# Patient Record
Sex: Female | Born: 2000 | Race: White | Hispanic: Yes | Marital: Single | Smoking: Former smoker
Health system: Southern US, Community
[De-identification: ages and names within clinical notes are randomized; demographics above are authoritative.]

## PROBLEM LIST (undated history)

## (undated) DIAGNOSIS — I10 Essential (primary) hypertension: Secondary | ICD-10-CM

## (undated) DIAGNOSIS — E039 Hypothyroidism, unspecified: Secondary | ICD-10-CM

---

## 2021-03-14 ENCOUNTER — Emergency Department (HOSPITAL_BASED_OUTPATIENT_CLINIC_OR_DEPARTMENT_OTHER)
Admission: EM | Admit: 2021-03-14 | Discharge: 2021-03-14 | Disposition: A | Discharge status: Home / Self Care | Payer: Medicaid Other | Attending: Emergency Medicine | Admitting: Emergency Medicine

## 2021-03-14 ENCOUNTER — Encounter (HOSPITAL_BASED_OUTPATIENT_CLINIC_OR_DEPARTMENT_OTHER): Payer: Self-pay | Admitting: Obstetrics and Gynecology

## 2021-03-14 ENCOUNTER — Other Ambulatory Visit: Payer: Self-pay

## 2021-03-14 ENCOUNTER — Emergency Department (HOSPITAL_BASED_OUTPATIENT_CLINIC_OR_DEPARTMENT_OTHER): Payer: Medicaid Other

## 2021-03-14 DIAGNOSIS — F17290 Nicotine dependence, other tobacco product, uncomplicated: Secondary | ICD-10-CM | POA: Diagnosis not present

## 2021-03-14 DIAGNOSIS — R112 Nausea with vomiting, unspecified: Secondary | ICD-10-CM | POA: Diagnosis present

## 2021-03-14 DIAGNOSIS — E860 Dehydration: Secondary | ICD-10-CM | POA: Diagnosis not present

## 2021-03-14 DIAGNOSIS — E039 Hypothyroidism, unspecified: Secondary | ICD-10-CM | POA: Diagnosis not present

## 2021-03-14 DIAGNOSIS — R000 Tachycardia, unspecified: Secondary | ICD-10-CM | POA: Insufficient documentation

## 2021-03-14 DIAGNOSIS — R630 Anorexia: Secondary | ICD-10-CM | POA: Diagnosis not present

## 2021-03-14 DIAGNOSIS — I10 Essential (primary) hypertension: Secondary | ICD-10-CM | POA: Insufficient documentation

## 2021-03-14 HISTORY — DX: Hypothyroidism, unspecified: E03.9

## 2021-03-14 HISTORY — DX: Essential (primary) hypertension: I10

## 2021-03-14 LAB — URINALYSIS, ROUTINE W REFLEX MICROSCOPIC
Bilirubin Urine: NEGATIVE
Glucose, UA: NEGATIVE mg/dL
Ketones, ur: >80 mg/dL — AB
Leukocytes,Ua: NEGATIVE
Nitrite: NEGATIVE
Protein, ur: >300 mg/dL — AB
Specific Gravity, Urine: 1.022 (ref 1.005–1.030)
pH: 5.5 (ref 5.0–8.0)

## 2021-03-14 LAB — COMPREHENSIVE METABOLIC PANEL
ALT: 39 U/L (ref 0–44)
AST: 28 U/L (ref 15–41)
Albumin: 4.6 g/dL (ref 3.5–5.0)
Alkaline Phosphatase: 69 U/L (ref 38–126)
Anion gap: 19 — ABNORMAL HIGH (ref 5–15)
BUN: 17 mg/dL (ref 6–20)
CO2: 17 mmol/L — ABNORMAL LOW (ref 22–32)
Calcium: 10.2 mg/dL (ref 8.9–10.3)
Chloride: 102 mmol/L (ref 98–111)
Creatinine, Ser: 1.43 mg/dL — ABNORMAL HIGH (ref 0.44–1.00)
GFR, Estimated: 54 mL/min — ABNORMAL LOW (ref >60)
Glucose, Bld: 75 mg/dL (ref 70–99)
Potassium: 3.8 mmol/L (ref 3.5–5.1)
Sodium: 138 mmol/L (ref 135–145)
Total Bilirubin: 0.7 mg/dL (ref 0.3–1.2)
Total Protein: 8.5 g/dL — ABNORMAL HIGH (ref 6.5–8.1)

## 2021-03-14 LAB — CBC
HCT: 55.4 % — ABNORMAL HIGH (ref 36.0–46.0)
Hemoglobin: 17.9 g/dL — ABNORMAL HIGH (ref 12.0–15.0)
MCH: 27.8 pg (ref 26.0–34.0)
MCHC: 32.3 g/dL (ref 30.0–36.0)
MCV: 86 fL (ref 80.0–100.0)
Platelets: 345 10*3/uL (ref 150–400)
RBC: 6.44 MIL/uL — ABNORMAL HIGH (ref 3.87–5.11)
RDW: 16.8 % — ABNORMAL HIGH (ref 11.5–15.5)
WBC: 9.8 10*3/uL (ref 4.0–10.5)
nRBC: 0 % (ref 0.0–0.2)

## 2021-03-14 LAB — TSH: TSH: 2.778 u[IU]/mL (ref 0.350–4.500)

## 2021-03-14 LAB — PREGNANCY, URINE: Preg Test, Ur: NEGATIVE

## 2021-03-14 LAB — LIPASE, BLOOD: Lipase: 45 U/L (ref 11–51)

## 2021-03-14 MED ORDER — ONDANSETRON HCL 4 MG/2ML IJ SOLN
4.0000 mg | Freq: Once | INTRAMUSCULAR | Status: AC
  Administered 2021-03-14: 4 mg via INTRAVENOUS
  Filled 2021-03-14: qty 2

## 2021-03-14 MED ORDER — IOHEXOL 300 MG/ML  SOLN
100.0000 mL | Freq: Once | INTRAMUSCULAR | Status: AC | PRN
  Administered 2021-03-14: 100 mL via INTRAVENOUS

## 2021-03-14 MED ORDER — SODIUM CHLORIDE 0.9 % IV BOLUS
1000.0000 mL | Freq: Once | INTRAVENOUS | Status: AC
  Administered 2021-03-14: 1000 mL via INTRAVENOUS

## 2021-03-14 MED ORDER — ONDANSETRON 4 MG PO TBDP: 4.0000 mg | ORAL_TABLET | Freq: Three times a day (TID) | ORAL | 0 refills | Status: DC | PRN

## 2021-03-14 NOTE — ED Provider Notes (Signed)
[facility] EMERGENCY DEPT Provider Note   CSN: [id number] Arrival date & time: 03/14/21  1807     History Chief Complaint  Patient presents with  . Nausea  . Anorexia    Diane Todd is a 20 y.o. female.  Pt presents to the ED today with nausea and poor appetite.  Pt initially went to the [facility] ED on 4/15.  She was told she had a uti and was d/c with macrobid and phenergan.  She went back on the 18th because she was still not feeling well and meds were changed to keflex and zofran.  Pt followed up with her pcp who put her on omeprazole.  Pt is still feeling nauseous and has not been able to eat or drink anything.  She denies any pain.  No fever.        Past Medical History:  Diagnosis Date  . Hypertension   . Hypothyroidism     There are no problems to display for this patient.   History reviewed. No pertinent surgical history.   OB History    Gravida  0   Para  0   Term  0   Preterm  0   AB  0   Living  0     SAB  0   IAB  0   Ectopic  0   Multiple  0   Live Births  0           No family history on file.  Social History   Tobacco Use  . Smoking status: Current Some Day Smoker    Types: E-cigarettes  . Smokeless tobacco: Never Used  Vaping Use  . Vaping Use: Some days  . Substances: Nicotine, Flavoring  Substance Use Topics  . Alcohol use: Not Currently  . Drug use: Not Currently    Home Medications Prior to Admission medications   Medication Sig Start Date End Date Taking? Authorizing Provider  ondansetron (ZOFRAN ODT) 4 MG disintegrating tablet Take 1 tablet (4 mg total) by mouth every 8 (eight) hours as needed for nausea or vomiting. 03/14/21  Yes Jacalyn Lefevre, MD    Allergies    Patient has no allergy information on record.  Review of Systems   Review of Systems  Gastrointestinal: Positive for nausea and vomiting.  All other systems reviewed and are negative.   Physical Exam Updated Vital  Signs BP 126/88 (BP Location: Left Arm)   Pulse 84   Temp 99.2 F (37.3 C) (Oral)   Resp 15   Ht 5\' 7"  (1.702 m)   Wt 111.1 kg   LMP 02/21/2021 Comment: neg preg test  SpO2 99%   BMI 38.37 kg/m   Physical Exam Vitals and nursing note reviewed.  Constitutional:      Appearance: Normal appearance.  HENT:     Head: Normocephalic and atraumatic.     Right Ear: External ear normal.     Left Ear: External ear normal.     Nose: Nose normal.     Mouth/Throat:     Mouth: Mucous membranes are dry.  Eyes:     Extraocular Movements: Extraocular movements intact.     Conjunctiva/sclera: Conjunctivae normal.     Pupils: Pupils are equal, round, and reactive to light.  Cardiovascular:     Rate and Rhythm: Regular rhythm. Tachycardia present.     Pulses: Normal pulses.     Heart sounds: Normal heart sounds.  Pulmonary:     Effort: Pulmonary effort  is normal.     Breath sounds: Normal breath sounds.  Abdominal:     General: Abdomen is flat. Bowel sounds are normal.     Palpations: Abdomen is soft.  Musculoskeletal:        General: Normal range of motion.     Cervical back: Normal range of motion and neck supple.  Skin:    General: Skin is warm.     Capillary Refill: Capillary refill takes less than 2 seconds.  Neurological:     General: No focal deficit present.     Mental Status: She is alert and oriented to person, place, and time.  Psychiatric:        Mood and Affect: Mood normal.        Behavior: Behavior normal.        Thought Content: Thought content normal.        Judgment: Judgment normal.     ED Results / Procedures / Treatments   Labs (all labs ordered are listed, but only abnormal results are displayed) Labs Reviewed  COMPREHENSIVE METABOLIC PANEL - Abnormal; Notable for the following components:      Result Value   CO2 17 (*)    Creatinine, Ser 1.43 (*)    Total Protein 8.5 (*)    GFR, Estimated 54 (*)    Anion gap 19 (*)    All other components within  normal limits  CBC - Abnormal; Notable for the following components:   RBC 6.44 (*)    Hemoglobin 17.9 (*)    HCT 55.4 (*)    RDW 16.8 (*)    All other components within normal limits  URINALYSIS, ROUTINE W REFLEX MICROSCOPIC - Abnormal; Notable for the following components:   APPearance HAZY (*)    Hgb urine dipstick MODERATE (*)    Ketones, ur >80 (*)    Protein, ur >300 (*)    Bacteria, UA MANY (*)    All other components within normal limits  URINE CULTURE  LIPASE, BLOOD  PREGNANCY, URINE  TSH    EKG None  Radiology CT ABDOMEN PELVIS W CONTRAST  Result Date: 03/14/2021 CLINICAL DATA:  Nausea and malaise for the past 2 weeks. EXAM: CT ABDOMEN AND PELVIS WITH CONTRAST TECHNIQUE: Multidetector CT imaging of the abdomen and pelvis was performed using the standard protocol following bolus administration of intravenous contrast. CONTRAST:  OMNIPAQUE IOHEXOL 300 MG/ML  SOLN COMPARISON:  Right upper quadrant ultrasound dated March 04, 2021. FINDINGS: Lower chest: No acute abnormality. Hepatobiliary: No focal liver abnormality is seen. No gallstones, gallbladder wall thickening, or biliary dilatation. Pancreas: Unremarkable. No pancreatic ductal dilatation or surrounding inflammatory changes. Spleen: Normal in size without focal abnormality. Adrenals/Urinary Tract: The adrenal glands are unremarkable. Lobulated appearance of both kidneys related to cortical scarring. Normal renal enhancement. Partially duplicated left renal collecting system. No renal calculi or hydronephrosis. The bladder is decompressed. Stomach/Bowel: Stomach is within normal limits. Appendix appears normal. No evidence of bowel wall thickening, distention, or inflammatory changes. Vascular/Lymphatic: No significant vascular findings are present. No enlarged abdominal or pelvic lymph nodes. Reproductive: Uterus and bilateral adnexa are unremarkable. Other: Trace free fluid in the pelvis, likely physiologic. No  pneumoperitoneum. Musculoskeletal: No acute or significant osseous findings. IMPRESSION: 1. No acute intra-abdominal process. 2. Bilateral renal cortical scarring. No CT evidence of acute pyelonephritis. Electronically Signed   By: Obie Dredge M.D.   On: 03/14/2021 20:40    Procedures Procedures   Medications Ordered in ED Medications  ondansetron (  ZOFRAN) injection 4 mg (4 mg Intravenous Given 03/14/21 1842)  sodium chloride 0.9 % bolus 1,000 mL ( Intravenous Stopped 03/14/21 1944)  iohexol (OMNIPAQUE) 300 MG/ML solution 100 mL (100 mLs Intravenous Contrast Given 03/14/21 2021)    ED Course  I have reviewed the triage vital signs and the nursing notes.  Pertinent labs & imaging results that were available during my care of the patient were reviewed by me and considered in my medical decision making (see chart for details).    MDM Rules/Calculators/A&P                          Pt is feeling much better.  She is now tolerating po fluids.  HR is better.  Pt's Cr is slightly high at 1.43 and she has some protein in her urine.  Culture of urine sent.  Pt is to f/u with gi.  Return if worse.   Final Clinical Impression(s) / ED Diagnoses Final diagnoses:  Dehydration  Non-intractable vomiting with nausea, unspecified vomiting type    Rx / DC Orders ED Discharge Orders         Ordered    Ambulatory referral to Gastroenterology        03/14/21 2155    ondansetron (ZOFRAN ODT) 4 MG disintegrating tablet  Every 8 hours PRN        03/14/21 2156           Jacalyn Lefevre, MD 03/14/21 2158

## 2021-03-14 NOTE — ED Triage Notes (Signed)
Patient reports for the past 2 weeks she has been increasingly nauseated and has not been able to eat or drink much. Patient went to the ER and was told she had a kidney infection and placed on antibioitcs, no CT scan performed. Patient reports she was getting worse and returned to the ER and was told she had "abnormal kidney" function levels and to follow up with her PCP. Patient reports her PCP did not do anything and she has started to feel worse.

## 2021-03-16 LAB — URINE CULTURE: Culture: NO GROWTH

## 2021-03-18 ENCOUNTER — Encounter: Payer: Self-pay | Admitting: Internal Medicine

## 2021-03-18 ENCOUNTER — Telehealth: Payer: Self-pay | Admitting: Internal Medicine

## 2021-03-18 NOTE — Telephone Encounter (Signed)
Er cone referral

## 2021-03-18 NOTE — Telephone Encounter (Signed)
Ok for new pt appointment  

## 2021-04-18 DIAGNOSIS — B9681 Helicobacter pylori [H. pylori] as the cause of diseases classified elsewhere: Secondary | ICD-10-CM | POA: Diagnosis present

## 2021-04-18 DIAGNOSIS — Z8679 Personal history of other diseases of the circulatory system: Secondary | ICD-10-CM

## 2021-04-18 HISTORY — DX: Helicobacter pylori (H. pylori) as the cause of diseases classified elsewhere: B96.81

## 2021-05-03 ENCOUNTER — Inpatient Hospital Stay (HOSPITAL_COMMUNITY): Payer: Medicaid Other

## 2021-05-03 ENCOUNTER — Inpatient Hospital Stay (HOSPITAL_COMMUNITY)
Admission: AD | Admit: 2021-05-03 | Discharge: 2021-05-17 | DRG: 058 | Disposition: A | Discharge status: Rehabilitation Facility | Payer: Medicaid Other | Source: Other Acute Inpatient Hospital | Attending: Internal Medicine | Admitting: Internal Medicine

## 2021-05-03 DIAGNOSIS — Z79899 Other long term (current) drug therapy: Secondary | ICD-10-CM | POA: Diagnosis not present

## 2021-05-03 DIAGNOSIS — Z6832 Body mass index (BMI) 32.0-32.9, adult: Secondary | ICD-10-CM

## 2021-05-03 DIAGNOSIS — K5900 Constipation, unspecified: Secondary | ICD-10-CM | POA: Diagnosis present

## 2021-05-03 DIAGNOSIS — K2970 Gastritis, unspecified, without bleeding: Secondary | ICD-10-CM | POA: Diagnosis not present

## 2021-05-03 DIAGNOSIS — M7918 Myalgia, other site: Secondary | ICD-10-CM | POA: Diagnosis not present

## 2021-05-03 DIAGNOSIS — Z8679 Personal history of other diseases of the circulatory system: Secondary | ICD-10-CM

## 2021-05-03 DIAGNOSIS — H5500 Unspecified nystagmus: Secondary | ICD-10-CM | POA: Diagnosis present

## 2021-05-03 DIAGNOSIS — F17290 Nicotine dependence, other tobacco product, uncomplicated: Secondary | ICD-10-CM | POA: Diagnosis present

## 2021-05-03 DIAGNOSIS — H469 Unspecified optic neuritis: Secondary | ICD-10-CM | POA: Diagnosis present

## 2021-05-03 DIAGNOSIS — E512 Wernicke's encephalopathy: Secondary | ICD-10-CM | POA: Diagnosis present

## 2021-05-03 DIAGNOSIS — Z79890 Hormone replacement therapy (postmenopausal): Secondary | ICD-10-CM

## 2021-05-03 DIAGNOSIS — I10 Essential (primary) hypertension: Secondary | ICD-10-CM | POA: Diagnosis not present

## 2021-05-03 DIAGNOSIS — E669 Obesity, unspecified: Secondary | ICD-10-CM | POA: Diagnosis present

## 2021-05-03 DIAGNOSIS — N289 Disorder of kidney and ureter, unspecified: Secondary | ICD-10-CM | POA: Diagnosis not present

## 2021-05-03 DIAGNOSIS — E039 Hypothyroidism, unspecified: Secondary | ICD-10-CM

## 2021-05-03 DIAGNOSIS — G360 Neuromyelitis optica [Devic]: Secondary | ICD-10-CM | POA: Diagnosis not present

## 2021-05-03 DIAGNOSIS — R270 Ataxia, unspecified: Secondary | ICD-10-CM | POA: Diagnosis present

## 2021-05-03 DIAGNOSIS — N182 Chronic kidney disease, stage 2 (mild): Secondary | ICD-10-CM | POA: Diagnosis present

## 2021-05-03 DIAGNOSIS — N179 Acute kidney failure, unspecified: Secondary | ICD-10-CM | POA: Diagnosis present

## 2021-05-03 DIAGNOSIS — R3129 Other microscopic hematuria: Secondary | ICD-10-CM | POA: Diagnosis present

## 2021-05-03 DIAGNOSIS — B9620 Unspecified Escherichia coli [E. coli] as the cause of diseases classified elsewhere: Secondary | ICD-10-CM | POA: Diagnosis present

## 2021-05-03 DIAGNOSIS — B9681 Helicobacter pylori [H. pylori] as the cause of diseases classified elsewhere: Secondary | ICD-10-CM | POA: Diagnosis not present

## 2021-05-03 DIAGNOSIS — E43 Unspecified severe protein-calorie malnutrition: Secondary | ICD-10-CM | POA: Diagnosis present

## 2021-05-03 DIAGNOSIS — D849 Immunodeficiency, unspecified: Secondary | ICD-10-CM | POA: Diagnosis present

## 2021-05-03 DIAGNOSIS — H40053 Ocular hypertension, bilateral: Secondary | ICD-10-CM | POA: Diagnosis present

## 2021-05-03 DIAGNOSIS — N189 Chronic kidney disease, unspecified: Secondary | ICD-10-CM

## 2021-05-03 DIAGNOSIS — Z8619 Personal history of other infectious and parasitic diseases: Secondary | ICD-10-CM | POA: Diagnosis not present

## 2021-05-03 DIAGNOSIS — D649 Anemia, unspecified: Secondary | ICD-10-CM | POA: Diagnosis present

## 2021-05-03 DIAGNOSIS — I129 Hypertensive chronic kidney disease with stage 1 through stage 4 chronic kidney disease, or unspecified chronic kidney disease: Secondary | ICD-10-CM | POA: Diagnosis present

## 2021-05-03 DIAGNOSIS — E038 Other specified hypothyroidism: Secondary | ICD-10-CM | POA: Diagnosis present

## 2021-05-03 DIAGNOSIS — N1831 Chronic kidney disease, stage 3a: Secondary | ICD-10-CM | POA: Diagnosis not present

## 2021-05-03 DIAGNOSIS — R339 Retention of urine, unspecified: Secondary | ICD-10-CM | POA: Diagnosis not present

## 2021-05-03 DIAGNOSIS — R112 Nausea with vomiting, unspecified: Secondary | ICD-10-CM

## 2021-05-03 DIAGNOSIS — F411 Generalized anxiety disorder: Secondary | ICD-10-CM | POA: Diagnosis not present

## 2021-05-03 DIAGNOSIS — M7989 Other specified soft tissue disorders: Secondary | ICD-10-CM | POA: Diagnosis not present

## 2021-05-03 MED ORDER — LORAZEPAM 2 MG/ML IJ SOLN
1.0000 mg | Freq: Once | INTRAMUSCULAR | Status: AC
  Administered 2021-05-03: 1 mg via INTRAVENOUS

## 2021-05-03 MED ORDER — LORAZEPAM 2 MG/ML IJ SOLN
2.0000 mg | Freq: Once | INTRAMUSCULAR | Status: DC
  Filled 2021-05-03: qty 1

## 2021-05-03 NOTE — Consult Note (Addendum)
Neurology Consult H&P  Diane Todd MR# [id number] 05/03/2021   CC: Imaging suggesting NMOSD  History is obtained from: patient, sister and chart  HPI: Diane Todd is a 20 y.o. 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 She initially presented to OSH ED after having 2 weeks of generalized weakness, poor appetite, blurred vision. Workup was not revealing and she was discharged however, symptoms progressed to include unsteadiness prompting her to return to ED for further evaluation. MRI brain showed multiple abnormalities and she was transferred for higher level of care.    She has never had this before.  Denies: dizziness/nausea/vomiting changes in bladder/bowel. there are no obvious aggravating or relieving factors.  There is no family history of demyelinating disease.  ROS: A complete ROS was performed and is negative except as noted in the HPI.   Past Medical History:  Diagnosis Date   Hypertension    Hypothyroidism    Social History:  reports that she has been smoking e-cigarettes. She has never used smokeless tobacco. She reports previous alcohol use. She reports previous drug use.   Prior to Admission medications   Medication Sig Start Date End Date Taking? Authorizing Provider  amoxicillin (AMOXIL) 500 MG capsule Take 1,000 mg by mouth every 12 (twelve) hours. Take for 14 days. Patient not taking: Reported on 05/03/2021 04/22/21 05/06/21  [provider]  clarithromycin (BIAXIN) 500 MG tablet Take 500 mg by mouth every 12 (twelve) hours. Take for 14 days. Patient not taking: Reported on 05/03/2021 04/22/21 05/06/21  [provider]  levothyroxine (SYNTHROID) 100 MCG tablet Take 100 mcg by mouth daily. Patient not taking: Reported on 05/03/2021    [provider]  metoprolol succinate (TOPROL-XL) 25 MG 24 hr tablet Take 25 mg by mouth daily. Patient not taking: Reported on 05/03/2021    [provider]  ondansetron (ZOFRAN ODT) 4 MG disintegrating tablet Take 1 tablet (4 mg total) by mouth every 8 (eight) hours as needed for nausea or vomiting. Patient not taking: Reported on 05/03/2021 03/14/21   Jacalyn Lefevre, MD  pantoprazole (PROTONIX) 40 MG tablet Take 40 mg by mouth 2 (two) times daily. Patient not taking: Reported on 05/03/2021 04/10/21   [provider]  sucralfate (CARAFATE) 1 g tablet Take 1 g by mouth every 6 (six) hours. Take for 14 days. Patient not taking: Reported on 05/03/2021 04/22/21 05/06/21  [provider]    Exam: Current vital signs: BP 113/64 (BP Location: Left Arm)   Pulse 95   Temp 99.3 F (37.4 C) (Oral)   Resp 17   SpO2 99%   Physical Exam  Constitutional: Appears well-developed and well-nourished.  Psych: Affect appropriate to situation Eyes: No scleral injection HENT: No OP obstruction. Head: Normocephalic.  Cardiovascular: Normal rate and regular rhythm.  Respiratory: Effort normal, symmetric excursions bilaterally, no audible wheezing. GI: Soft.  No distension. There is no tenderness.  Skin: WDI  Neuro: Mental Status: Patient is awake, alert, oriented to person, place, month, year, and situation. Patient is able to give a clear and coherent history. Speech fluent, intact comprehension and repetition. No signs of aphasia or neglect. Visual Fields are full. Pupils are equal, round, and reactive to light. EOMI without ptosis or diploplia.  Facial sensation is symmetric to temperature Facial movement is symmetric.  Hearing is intact to voice. Uvula midline and palate elevates symmetrically. Shoulder shrug is symmetric. Tongue is midline without atrophy or fasciculations.  Tone is normal.  Bulk is normal. 5/5 strength was present in all four extremities. Sensation is decreased to temperature bilateral legs>arms. Deep Tendon Reflexes: 2+ and symmetric in the biceps and patellae. Toes are downgoing bilaterally. No UE  ataxia, decompensation of movement, mild ataxia in lower extremities. Gait - Deferred  I have reviewed labs in epic and the pertinent results are: No pertinent lab results at this time.  MRI brain 05/02/2021 from OSH read as T2 hyperintensity along the dorsal midbrain bilaterally about the cerebral aqueduct involving tectum and tegmentum. This extends inferiorly along the dorsal pons and medulla abutting the floor of the fourth ventricle including area  postrema. Additionally, there is involvement of the medial and posteromedial thalamus bilaterally.   Assessment: Diane Todd is a 20 y.o. 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 Review of her prior history in the chart shows that she has had persistent/chronic nausea and vomiting since April 2022. She also has symptoms of blurry vision with eye discomfort which occurred after the nausea and vomiting. 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 If she meets MRI criteria, the diagnosis may be made in the absence of serology however, labs have been ordered.   Plan: - MRI brain without and with contrast to include the orbits - Ordered. - MRI without and with contrast cervical and thoracic spines - Ordered. - Serum for AQP4-IgG antibody and MOG-IgG antibody test - Ordered (send out). - HIV serology to rule out possible confounder. - Methylprednisolone 1,000mg  x 5 days without an oral taper - If she is poorly responsive to glucocorticoids, recommend plasma exchange. - PPI daily. - PT/OT  evaluation. - Ppx DVT and docusate. - After day 5 of therapy, she will need to follow up with outpatient neurology, and based on lab results they will then select available treatment options (eculizumab, inebilizumab, satralizumab, off-label rituximab).    Electronically signed by:  Marisue Humble, MD Page: [phone] 05/03/2021, 7:44 PM

## 2021-05-03 NOTE — Progress Notes (Signed)
Diane Todd is a 20 y.o. female patient admitted from [facility]. Patient arrived to unit at 1840 via stretcher ASSESSMENT: Patient is awake, alert  & orientated  X 4  No Orders on, MD notified of patient arrival to the unit. VSS - Blood pressure 117/66, pulse (!) 103, temperature 99.8 F (37.7 C), temperature source Axillary, resp. rate 18, SpO2 96 %.,  room air ,no c/o shortness of breath, no c/o chest pain, no distress noted.  Pt orientation to unit, room and routine.  Admission INP armband ID verified with patient/family, and in place. SR up x 2, fall risk assessment complete with Patient and family verbalizing understanding of risks associated with falls. Pt verbalizes an understanding of how to use the call bell and to call for help before getting out of bed.  Skin, clean-dry- intact without evidence of bruising, or skin tears.   No evidence of skin break down noted on exam  Will cont to monitor and assist as needed.  Melvenia Needles, RN 05/03/2021 8:03 PM

## 2021-05-04 ENCOUNTER — Inpatient Hospital Stay (HOSPITAL_COMMUNITY): Payer: Medicaid Other

## 2021-05-04 ENCOUNTER — Other Ambulatory Visit: Payer: Self-pay

## 2021-05-04 DIAGNOSIS — N289 Disorder of kidney and ureter, unspecified: Secondary | ICD-10-CM | POA: Diagnosis not present

## 2021-05-04 DIAGNOSIS — B9681 Helicobacter pylori [H. pylori] as the cause of diseases classified elsewhere: Secondary | ICD-10-CM

## 2021-05-04 DIAGNOSIS — Z8679 Personal history of other diseases of the circulatory system: Secondary | ICD-10-CM | POA: Diagnosis not present

## 2021-05-04 DIAGNOSIS — H469 Unspecified optic neuritis: Secondary | ICD-10-CM | POA: Diagnosis not present

## 2021-05-04 DIAGNOSIS — K2970 Gastritis, unspecified, without bleeding: Secondary | ICD-10-CM

## 2021-05-04 DIAGNOSIS — E039 Hypothyroidism, unspecified: Secondary | ICD-10-CM | POA: Diagnosis not present

## 2021-05-04 LAB — COMPREHENSIVE METABOLIC PANEL
ALT: 30 U/L (ref 0–44)
AST: 21 U/L (ref 15–41)
Albumin: 3.5 g/dL (ref 3.5–5.0)
Alkaline Phosphatase: 46 U/L (ref 38–126)
Anion gap: 12 (ref 5–15)
BUN: 25 mg/dL — ABNORMAL HIGH (ref 6–20)
CO2: 19 mmol/L — ABNORMAL LOW (ref 22–32)
Calcium: 9.1 mg/dL (ref 8.9–10.3)
Chloride: 113 mmol/L — ABNORMAL HIGH (ref 98–111)
Creatinine, Ser: 1.56 mg/dL — ABNORMAL HIGH (ref 0.44–1.00)
GFR, Estimated: 49 mL/min — ABNORMAL LOW (ref >60)
Glucose, Bld: 134 mg/dL — ABNORMAL HIGH (ref 70–99)
Potassium: 3.9 mmol/L (ref 3.5–5.1)
Sodium: 144 mmol/L (ref 135–145)
Total Bilirubin: 1.5 mg/dL — ABNORMAL HIGH (ref 0.3–1.2)
Total Protein: 6.7 g/dL (ref 6.5–8.1)

## 2021-05-04 LAB — URINALYSIS, ROUTINE W REFLEX MICROSCOPIC
Bilirubin Urine: NEGATIVE
Glucose, UA: 50 mg/dL — AB
Ketones, ur: 20 mg/dL — AB
Nitrite: POSITIVE — AB
Protein, ur: 30 mg/dL — AB
RBC / HPF: >50 RBC/hpf — ABNORMAL HIGH (ref 0–5)
Specific Gravity, Urine: 1.024 (ref 1.005–1.030)
WBC, UA: >50 WBC/hpf — ABNORMAL HIGH (ref 0–5)
pH: 5 (ref 5.0–8.0)

## 2021-05-04 LAB — CBC
HCT: 45.4 % (ref 36.0–46.0)
Hemoglobin: 14.8 g/dL (ref 12.0–15.0)
MCH: 28 pg (ref 26.0–34.0)
MCHC: 32.6 g/dL (ref 30.0–36.0)
MCV: 85.8 fL (ref 80.0–100.0)
Platelets: 186 10*3/uL (ref 150–400)
RBC: 5.29 MIL/uL — ABNORMAL HIGH (ref 3.87–5.11)
RDW: 18.9 % — ABNORMAL HIGH (ref 11.5–15.5)
WBC: 8.4 10*3/uL (ref 4.0–10.5)
nRBC: 0.2 % (ref 0.0–0.2)

## 2021-05-04 LAB — PROTEIN / CREATININE RATIO, URINE
Creatinine, Urine: 152.45 mg/dL
Protein Creatinine Ratio: 0.43 mg/mg{Cre} — ABNORMAL HIGH (ref 0.00–0.15)
Total Protein, Urine: 66 mg/dL

## 2021-05-04 LAB — GLUCOSE, CAPILLARY: Glucose-Capillary: 134 mg/dL — ABNORMAL HIGH (ref 70–99)

## 2021-05-04 LAB — HIV ANTIBODY (ROUTINE TESTING W REFLEX): HIV Screen 4th Generation wRfx: NONREACTIVE

## 2021-05-04 MED ORDER — PANTOPRAZOLE SODIUM 20 MG PO TBEC: 20.0000 mg | DELAYED_RELEASE_TABLET | Freq: Every day | ORAL | Status: DC

## 2021-05-04 MED ORDER — PANTOPRAZOLE SODIUM 40 MG IV SOLR
40.0000 mg | INTRAVENOUS | Status: DC
  Administered 2021-05-04: 40 mg via INTRAVENOUS
  Filled 2021-05-04: qty 40

## 2021-05-04 MED ORDER — ACETAMINOPHEN 650 MG RE SUPP: 650.0000 mg | Freq: Four times a day (QID) | RECTAL | Status: DC | PRN

## 2021-05-04 MED ORDER — TRAZODONE HCL 50 MG PO TABS
25.0000 mg | ORAL_TABLET | Freq: Every evening | ORAL | Status: DC | PRN
  Filled 2021-05-04 (×2): qty 1

## 2021-05-04 MED ORDER — ADULT MULTIVITAMIN W/MINERALS CH
1.0000 | ORAL_TABLET | Freq: Every day | ORAL | Status: DC
  Filled 2021-05-04: qty 1

## 2021-05-04 MED ORDER — ACETAMINOPHEN 325 MG PO TABS: 650.0000 mg | ORAL_TABLET | Freq: Four times a day (QID) | ORAL | Status: DC | PRN

## 2021-05-04 MED ORDER — SODIUM CHLORIDE 0.9 % IV SOLN
1000.0000 mg | Freq: Every day | INTRAVENOUS | Status: AC
  Administered 2021-05-04 – 2021-05-08 (×5): 1000 mg via INTRAVENOUS
  Filled 2021-05-04 (×5): qty 8

## 2021-05-04 MED ORDER — BOOST / RESOURCE BREEZE PO LIQD CUSTOM: 1.0000 | Freq: Three times a day (TID) | ORAL | Status: DC

## 2021-05-04 MED ORDER — OXYCODONE HCL 5 MG PO TABS: 5.0000 mg | ORAL_TABLET | ORAL | Status: DC | PRN

## 2021-05-04 MED ORDER — ENOXAPARIN SODIUM 40 MG/0.4ML IJ SOSY
40.0000 mg | PREFILLED_SYRINGE | Freq: Every day | INTRAMUSCULAR | Status: DC
  Administered 2021-05-04: 40 mg via SUBCUTANEOUS
  Filled 2021-05-04 (×2): qty 0.4

## 2021-05-04 MED ORDER — DOCUSATE SODIUM 100 MG PO CAPS
100.0000 mg | ORAL_CAPSULE | Freq: Two times a day (BID) | ORAL | Status: DC
  Administered 2021-05-10 – 2021-05-16 (×13): 100 mg via ORAL
  Filled 2021-05-04 (×17): qty 1

## 2021-05-04 MED ORDER — CHLORHEXIDINE GLUCONATE CLOTH 2 % EX PADS
6.0000 | MEDICATED_PAD | Freq: Every day | CUTANEOUS | Status: DC
  Administered 2021-05-04 – 2021-05-17 (×12): 6 via TOPICAL

## 2021-05-04 MED ORDER — ONDANSETRON HCL 4 MG/2ML IJ SOLN
4.0000 mg | Freq: Four times a day (QID) | INTRAMUSCULAR | Status: DC | PRN
  Administered 2021-05-14: 4 mg via INTRAVENOUS
  Filled 2021-05-04: qty 2

## 2021-05-04 MED ORDER — LACTATED RINGERS IV BOLUS
1000.0000 mL | Freq: Once | INTRAVENOUS | Status: AC
  Administered 2021-05-04: 1000 mL via INTRAVENOUS

## 2021-05-04 MED ORDER — ONDANSETRON HCL 4 MG PO TABS: 4.0000 mg | ORAL_TABLET | Freq: Four times a day (QID) | ORAL | Status: DC | PRN

## 2021-05-04 MED ORDER — METHOCARBAMOL 1000 MG/10ML IJ SOLN
500.0000 mg | Freq: Four times a day (QID) | INTRAVENOUS | Status: DC | PRN
  Filled 2021-05-04: qty 5

## 2021-05-04 MED ORDER — GADOBUTROL 1 MMOL/ML IV SOLN
10.0000 mL | Freq: Once | INTRAVENOUS | Status: AC | PRN
  Administered 2021-05-04: 10 mL via INTRAVENOUS

## 2021-05-04 MED ORDER — SODIUM CHLORIDE 0.9 % IV SOLN
INTRAVENOUS | Status: DC | PRN
  Administered 2021-05-04: 100 mL/h via INTRAVENOUS

## 2021-05-04 MED ORDER — PANTOPRAZOLE SODIUM 40 MG IV SOLR: 40.0000 mg | Freq: Once | INTRAVENOUS | Status: DC

## 2021-05-04 NOTE — Evaluation (Signed)
Physical Therapy Evaluation Patient Details Name: Diane Todd MRN: [id number] DOB: June 22, 2001 Today's Date: 05/04/2021   History of Present Illness  Pt is 20 yo female who presents from [facility] on 05/03/21 with worsening blurry vision and difficulty walking. She has had 2 months of N&V and was dx with H pylori gastritis and treated at [facility]. 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 PMH: HTN, thyroid disorder.  Clinical Impression  Pt admitted with above diagnosis. Pt presents with lethargy and generalized weakness. She describes her vision as double and a R eye patch did help some but in general she keeps both eyes closed. Pt required max A to come to sitting EOB and lacked trunk control losing balance in al planes. Pt attempted standing but was unable to take full wt through LE's and could not rise even with max A. Pt scooted L to drop arm chair with max A. Pt relays tingling in BUE's and BLE's. Pt will hopefully respond to meds and be able to tolerate aggressive therapy in CIR setting to return to independence in which she worked at [employer] and took classes at [employer]  Pt currently with functional limitations due to the deficits listed below (see PT Problem List). Pt will benefit from skilled PT to increase their independence and safety with mobility to allow discharge to the venue listed below.       Follow Up Recommendations CIR;Supervision/Assistance - 24 hour    Equipment Recommendations  Other (comment) (TBD)    Recommendations for Other Services Rehab consult;OT consult;Speech consult     Precautions / Restrictions Precautions Precautions: Fall Precaution Comments: pt fell at home PTA Restrictions Weight Bearing Restrictions: No      Mobility  Bed Mobility Overal bed mobility: Needs Assistance Bed Mobility: Supine to Sit     Supine to sit: Max assist     General bed mobility comments: Pt able to bridge knees but mod A  to bring LE's off EOB. Max A for elevation of trunk into sitting.    Transfers Overall transfer level: Needs assistance   Transfers: Lateral/Scoot Transfers;Sit to/from Stand Sit to Stand: Max assist        Lateral/Scoot Transfers: Max assist General transfer comment: attempted standing with max A but pt fearful and only wt shifted fwd, was not able to lift buttocks. Scooted L into drop arm recliner with max A for power as well as trunk control as pt could not keep self upright. Pushed with LE's minimally, did use momentum to scoot at hips  Ambulation/Gait             General Gait Details: unable  Stairs            Wheelchair Mobility    Modified Rankin (Stroke Patients Only)       Balance Overall balance assessment: Needs assistance Sitting-balance support: Feet supported;Bilateral upper extremity supported Sitting balance-Leahy Scale: Poor Sitting balance - Comments: mod A to maintain sitting with LOB in all directions                                     Pertinent Vitals/Pain Pain Assessment: No/denies pain    Home Living Family/patient expects to be discharged to:: Private residence Living Arrangements: Parent;Other relatives Available Help at Discharge: Family;Available 24 hours/day Type of Home: House Home Access: Stairs to enter   [employer] of Steps: 5 Home  Layout: One level Home Equipment: Wheelchair - manual;Shower seat Additional Comments: pt's older sister was present on eval and answered many questions as pt was very lethargic.    Prior Function Level of Independence: Independent         Comments: prior to 4/22 pt was working at [employer] and going to [employer] Since 4/22 pt has been mostly in bed and getting weaker and weaker, family has been helping with ambulation and ADL's     Hand Dominance        Extremity/Trunk Assessment   Upper Extremity Assessment Upper Extremity Assessment: Generalized weakness     Lower Extremity Assessment Lower Extremity Assessment: Generalized weakness;RLE deficits/detail;LLE deficits/detail RLE Deficits / Details: hip flex 2/5, knee ext 2/5, ankle df 2/5 RLE Sensation:  ("tingling") RLE Coordination: decreased gross motor;decreased fine motor LLE Deficits / Details: hip flex 2+/5, knee ext 2+/5, ankle df 2+/5 LLE Sensation:  ("tingling") LLE Coordination: decreased fine motor;decreased gross motor    Cervical / Trunk Assessment Cervical / Trunk Assessment: Other exceptions Cervical / Trunk Exceptions: pt lacking trunk control in sitting, losing balance in all directions  Communication   Communication: Expressive difficulties  Cognition Arousal/Alertness: Lethargic Behavior During Therapy: Flat affect Overall Cognitive Status: Difficult to assess Area of Impairment: Attention;Memory;Following commands;Safety/judgement;Awareness;Problem solving                   Current Attention Level: Focused Memory: Decreased short-term memory Following Commands: Follows one step commands inconsistently;Follows one step commands with increased time Safety/Judgement: Decreased awareness of safety Awareness: Intellectual Problem Solving: Slow processing;Decreased initiation;Difficulty sequencing;Requires verbal cues;Requires tactile cues General Comments: pt reports she doesn't remember what she was going to school for. Sister reports she has asked her how her wors day was when she has been here at the hospital with her the whole time. Very lethargic with eyes closed most of time. Also with younger than her age demeanor      General Comments General comments (skin integrity, edema, etc.): pt reports double vision rather than blurry. Eye patch placed over each eye and pt preferred it over R. Helped her to maintain L eye open a little bit more but generally kept closing both eyes. She did report that patch stopped her diplopia    Exercises     Assessment/Plan     PT Assessment Patient needs continued PT services  PT Problem List Decreased strength;Decreased range of motion;Decreased activity tolerance;Decreased balance;Decreased mobility;Decreased coordination;Decreased cognition;Decreased knowledge of use of DME;Decreased safety awareness;Decreased knowledge of precautions;Impaired sensation;Impaired tone       PT Treatment Interventions Functional mobility training;Therapeutic activities;Therapeutic exercise;Balance training;Patient/family education;Cognitive remediation;Neuromuscular re-education    PT Goals (Current goals can be found in the Care Plan section)  Acute Rehab PT Goals Patient Stated Goal: be able to walk PT Goal Formulation: With patient Time For Goal Achievement: 05/18/21 Potential to Achieve Goals: Good    Frequency Min 3X/week   Barriers to discharge Inaccessible home environment 5 stairs to enter home    Co-evaluation               AM-PAC PT "6 Clicks" Mobility  Outcome Measure Help needed turning from your back to your side while in a flat bed without using bedrails?: Total Help needed moving from lying on your back to sitting on the side of a flat bed without using bedrails?: Total Help needed moving to and from a bed to a chair (including a wheelchair)?: Total Help needed standing up from a chair  using your arms (e.g., wheelchair or bedside chair)?: Total Help needed to walk in hospital room?: Total Help needed climbing 3-5 steps with a railing? : Total 6 Click Score: 6    End of Session   Activity Tolerance: Patient limited by lethargy Patient left: in chair;with call bell/phone within reach;with chair alarm set;with family/visitor present Nurse Communication: Mobility status;Other (comment) (2 person assist) PT Visit Diagnosis: Muscle weakness (generalized) (M62.81);History of falling (Z91.81);Difficulty in walking, not elsewhere classified (R26.2)    Time: 1120-1200 PT Time Calculation (min) (ACUTE  ONLY): 40 min   Charges:   PT Evaluation $PT Eval Moderate Complexity: 1 Mod PT Treatments $Therapeutic Activity: 8-22 mins $Neuromuscular Re-education: 8-22 mins        Lyanne Co, PT  Acute Rehab Services  Pager [phone] Office [phone]   Lawana Chambers Shinichi Anguiano 05/04/2021, 12:34 PM

## 2021-05-04 NOTE — Progress Notes (Signed)
Awaiting MD orders for patient Care, have messaged Neuro MD

## 2021-05-04 NOTE — Progress Notes (Signed)
Initial Nutrition Assessment  DOCUMENTATION CODES:   Severe malnutrition in context of acute illness/injury  INTERVENTION:   Boost Breeze po TID, each supplement provides 250 kcal and 9 grams of protein  Magic cup TID with meals, each supplement provides 290 kcal and 9 grams of protein  MVI po daily   Pt at high refeed risk; recommend monitor potassium, magnesium and phosphorus labs daily until stable  If nasogastric tube placed, recommend:  Osmolite 1.5 @65ml /hr- Initiate at 60ml/hr and increase by 69ml/hr q 8 hours until goal rate is reached  Pro-Source 53ml BID via tube, provides 40kcal and 11g of protein per serving   Free water flushes 48m q4 hours   Regimen provides 2420kcal/day, 120g/day protein and 1952ml/day free water.   NUTRITION DIAGNOSIS:   Severe Malnutrition related to acute illness as evidenced by energy intake < or equal to 50% for > or equal to 1 month, percent weight loss.  GOAL:   Patient will meet greater than or equal to 90% of their needs  MONITOR:   PO intake, Supplement acceptance, Labs, Weight trends, Skin, I & O's  REASON FOR ASSESSMENT:   Malnutrition Screening Tool    ASSESSMENT:   20 y.o. female with history of hypertension, hypothyroidism and H. 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  RD working remotely.  Spoke with pt's sister via phone; history provided by sister as pt did not feel well enough to speak over the phone. Per sister report, pt with poor appetite and oral intake since April. Sister reports that for the month of April, pt was unable to eat or keep anything down. Sister reports that in May, pt did start to eat some bites of applesauce. Sister reports that in May, pt started treatment for H. Pylori and that over the past few weeks her oral intake had improved but she is still eating only bites of meals. Pt has tried drinking supplements over the  past month but reports that she has been unable to keep down anything milky. RD discussed with pt's sister the importance of adequate nutritional intake needed to preserve lean muscle. Pt is willing to try Boost Breeze and June. Spoke with MD, recommend consideration of post-pyloric cortrak tube placement and nutrition support if pt's oral intake does not improve over the next 1-2 days. Cortrak service will not be available until Monday. Pt is at high refeed risk. Sister reports a 40lb weight loss since April. Per chart, pt is down 44lbs(17%) over the past 3 month; this is severe weight loss.     Medications reviewed and include: colace, lovenox, protonix, solu-medrol  Labs reviewed: K 3.9 wnl, BUN 25(H), creat 1.56(H)  NUTRITION - FOCUSED PHYSICAL EXAM: Unable to perform at this time   Diet Order:   Diet Order             Diet regular Room service appropriate? Yes; Fluid consistency: Thin  Diet effective now                  EDUCATION NEEDS:   Education needs have been addressed  Skin:  Skin Assessment: Reviewed RN Assessment (ecchymosis)  Last BM:  pta  Height:   Ht Readings from Last 1 Encounters:  05/04/21 5\' 7"  (1.702 m)    Weight:   Wt Readings from Last 1 Encounters:  03/14/21 111.1 kg (>99 %, Z= 2.47)*   * Growth percentiles are based on CDC (Girls, 2-20 Years)  data.    Ideal Body Weight:  61.36 kg  BMI:  Body mass index is 38.37 kg/m.  Estimated Nutritional Needs:   Kcal:  2200-2500kcal/day  Protein:  110-125g/day  Fluid:  1.9-2.2L/day  Betsey Holiday MS, RD, LDN Please refer to [facility] for RD and/or RD on-call/weekend/after hours pager

## 2021-05-04 NOTE — Evaluation (Signed)
Clinical/Bedside Swallow Evaluation Patient Details  Name: Diane Todd MRN: [id number] Date of Birth: 2001/09/08  Today's Date: 05/04/2021 Time: SLP Start Time (ACUTE ONLY): 1310 SLP Stop Time (ACUTE ONLY): 1325 SLP Time Calculation (min) (ACUTE ONLY): 15 min  Past Medical History:  Past Medical History:  Diagnosis Date   Hypertension    Hypothyroidism    Past Surgical History: No past surgical history on file. HPI:  Diane Todd is a 20 y.o. 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  Per review of chart patient has been to the [facility] ED multiple times over the past 2 months for range of issues.  Over this time she has consistently endorsed chronic nausea and vomiting.  Has also had work-up for gastritis and found to have H. pylori.  Also noted to have baseline creatinine of around 1.5 with unclear chronicity.  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  MRI of the brain was showing hyperintense T2-weighted signal and mild contrast enhancement within the medial thalami and periaqueductal gray matter concering for demyelinating disease.   Assessment / Plan / Recommendation Clinical Impression  Clinical swallowing evaluation was completed using thin liquids via spoon, cup and straw, pureed material and dry solids.  The pateint was seated in her bedside chair in an upright position.  She appeared to favor her left side and leaned that way or had her head turned that way during most of exam.  She was able to right her head to midline when requested but it would eventually drift back over to the left.  She kept her eyes closed for entirety of evaluation due to the visual  disturbance that brought her into the hospital.  Per RN patient's intake has been poor and there is concern for swallowing issues.  The patient reported some issues with swallowing but was not really able to elaborate on exactly what she was having issues with.  She reported at times she has issues drinking but then said not that much trouble.  She reported that she is fearful to get choked but then said she had never gotten choked.  So it was difficult to tell what swallowing issues she was having prior to admission.  Cranial nerve exam was completed and remarkable for reduced jaw opening.  Otherwise, lingual, labial and facial range of motion and strength were adequate.  Jaw strength was good and facial sensation appeared to be intact bilaterally.  Her oral and pharyngeal swallow appeared essentially functional.  She was noted to take very small bites/sips.  Mastication of dry solids appeared slow but she was able to clear material from her oral cavity.  Swallow trigger was appreciated to palpation.  Throat clear x 1 was seen over multiple swallows of thin liquids.  Otherwise, no other s/s of aspiration were seen.  Suggest she remain on regular diet with thin liquids.  ST will follow during acute stay for therapeutic diet tolerance. SLP Visit Diagnosis: Dysphagia, unspecified (R13.10)    Aspiration Risk  Mild aspiration risk    Diet Recommendation    Regular with thin liquids Medication Administration: Whole meds with liquid    Other  Recommendations Oral Care Recommendations: Oral care BID   Follow up Recommendations Other (comment) (TBD)  Frequency and Duration min 1 x/week  2 weeks       Prognosis Prognosis for Safe Diet Advancement: Good      Swallow Study   General Date of Onset: 05/03/21 HPI: Diane Todd is a 20 y.o. 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   Per review of chart patient has been to the [facility] ED multiple times over the past 2 months for range of issues.  Over this time she has consistently endorsed chronic nausea and vomiting.  Has also had work-up for gastritis and found to have H. pylori.  Also noted to have baseline creatinine of around 1.5 with unclear chronicity.  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  MRI of the brain was showing hyperintense T2-weighted signal and mild contrast enhancement within the medial thalami and periaqueductal gray matter concering for demyelinating disease. Type of Study: Bedside Swallow Evaluation Previous Swallow Assessment: None noted at [facility] Diet Prior to this Study: Regular;Thin liquids Temperature Spikes Noted: Yes Respiratory Status: Room air History of Recent Intubation: No Behavior/Cognition: Alert;Cooperative Oral Cavity Assessment: Within Functional Limits Oral Care Completed by SLP: No Oral Cavity - Dentition: Adequate natural dentition Vision: Functional for self-feeding Self-Feeding Abilities: Able to feed self Patient Positioning: Upright in chair Baseline Vocal Quality: Normal Volitional Swallow: Able to elicit    Oral/Motor/Sensory Function Overall Oral Motor/Sensory Function: Within functional limits   Ice Chips Ice chips: Not tested   Thin Liquid Thin Liquid: Impaired Presentation: Straw;Spoon;Cup Oral Phase Impairments: Reduced labial seal Oral Phase Functional Implications: Right anterior spillage;Left anterior spillage Pharyngeal  Phase Impairments: Throat Clearing - Delayed (x1 bolus trial)    Nectar Thick Nectar Thick Liquid: Not tested   Honey Thick Honey Thick Liquid: Not tested   Puree Puree: Within functional limits Presentation: Spoon (very small bites)    Solid     Solid: Impaired Presentation: Self Fed Oral Phase Impairments: Impaired mastication Oral Phase Functional Implications: Prolonged oral transit     Dimas Aguas, MA, CCC-SLP Acute Rehab SLP [phone]  Fleet Contras 05/04/2021,1:52 PM

## 2021-05-04 NOTE — Progress Notes (Signed)
Discussed Plan of care and need for orders with Night Neuro MD. He also discussed possibly for [facility] admit MD or [facility] Floor Coverage MD to see patient as well.   Still awaiting orders, will continue to monitor patient.

## 2021-05-04 NOTE — H&P (Signed)
[facility] History and Physical  Diane Todd ONG:[id number] DOB: Apr 08, 2001 DOA: 05/03/2021  Referring physician: Dr. Orlan LeavensFote, [facility] PCP: Pcp, No   Chief Complaint: Vision loss  HPI: Diane Todd is a 20 y.o. 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  Per review of chart patient [facility] multiple times over the past 2 months for range of issues.  Over this time she has consistently endorsed chronic nausea and vomiting.  Has also had work-up for gastritis and found to have H. pylori.  Also noted to have baseline creatinine of around 1.5 with unclear chronicity.  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  On my interview patient confirms the above history.  Her sister is present at bedside and also provides collateral.  She states that she has been feeling very nauseous for months, and that over the past 2 weeks she is begun to have worsening blurry vision and difficulty walking.  She states this was present during her admission [facility] for H. pylori gastritis, but it was not quite as severe as at that time.  Also endorses numbness, tingling, weakness throughout her body contributing to her difficulty with walking.  She endorses a past medical history since her teenage years of high blood pressure and thyroid problems, but she denies knowing of any kidney problems until recently.  Since arrival vital signs of been stable except for mild tachycardia.  CBC has been obtained since arrival which is unremarkable, as well as a nonreactive HIV screen.  [facility] notable for creatinine of 1.64, which appears to be around her baseline.  Also  notable for mildly decreased CO2, and mildly elevated AST at 45.  She also had a TSH that was elevated at 5.2, however her free T4 was normal.  Repeat MRI of the brain and both cervical and thoracic spine was obtained, was consistent with outside imaging and showed demyelinating disease of the medial thalamic IN periaqueductal gray matter.  No lesions were seen in the cervical or thoracic spinal cord.  MRI of the orbits is pending.  Review of Systems:  Pertinent positives and negative per HPI, all others reviewed and negative  Past Medical History:  Diagnosis Date   Hypertension    Hypothyroidism    No past surgical history on file. Social History:  reports that she has been smoking e-cigarettes. She has never used smokeless tobacco. She reports previous alcohol use. She reports previous drug use.  No Known Allergies  No family history on file.   Prior to Admission medications   Medication Sig Start Date End Date Taking? Authorizing Provider  amoxicillin (AMOXIL) 500 MG capsule Take 1,000 mg by mouth every 12 (twelve) hours. Take for 14 days. Patient not taking: Reported on 05/03/2021 04/22/21 05/06/21  [provider]  clarithromycin (BIAXIN) 500 MG tablet Take 500 mg by mouth every 12 (twelve) hours. Take for 14 days. Patient not taking: Reported on 05/03/2021 04/22/21 05/06/21  [provider]  levothyroxine (SYNTHROID) 100 MCG tablet Take 100 mcg by mouth daily. Patient not taking: Reported on 05/03/2021    [provider]  metoprolol succinate (TOPROL-XL) 25 MG 24 hr tablet Take 25 mg by mouth daily. Patient not  taking: Reported on 05/03/2021    [provider]  ondansetron (ZOFRAN ODT) 4 MG disintegrating tablet Take 1 tablet (4 mg total) by mouth every 8 (eight) hours as needed for nausea or vomiting. Patient not taking: Reported on 05/03/2021 03/14/21   Jacalyn Lefevre, MD  pantoprazole (PROTONIX) 40 MG tablet Take 40 mg by mouth 2 (two) times  daily. Patient not taking: Reported on 05/03/2021 04/10/21   [provider]  sucralfate (CARAFATE) 1 g tablet Take 1 g by mouth every 6 (six) hours. Take for 14 days. Patient not taking: Reported on 05/03/2021 04/22/21 05/06/21  [provider]   Physical Exam: Vitals:   05/03/21 1956 05/03/21 2333 05/04/21 0235 05/04/21 0427  BP: 117/66 121/76  124/74  Pulse: (!) 103 (!) 109  (!) 103  Resp: 18 16  18   Temp: 99.8 F (37.7 C) 100 F (37.8 C) 98.7 F (37.1 C) 98.4 F (36.9 C)  TempSrc: Axillary Oral Oral Oral  SpO2: 96% 94%  95%  Height:    5\' 7"  (1.702 m)    Wt Readings from Last 3 Encounters:  03/14/21 111.1 kg (>99 %, Z= 2.47)*   * Growth percentiles are based on CDC (Girls, 2-20 Years) data.     General:  Appears calm and comfortable. Keeping eyes closed throughout interview as she reports this more comfortable due to her blurry vision Eyes: PERRL, normal lids, irises & conjunctiva ENT: grossly normal hearing, lips & tongue Cardiovascular: RRR, no m/r/g. No LE edema.  Respiratory: CTA bilaterally, no w/r/r. Normal respiratory effort. Abdomen: soft, ntnd Skin: no rash or induration seen on limited exam Musculoskeletal: grossly normal tone BUE/BLE Psychiatric: grossly normal mood and affect, speech fluent and appropriate Neurologic: CN appear intact with exception of EOM though likely secondary to vision loss rather than palsy, has normal response to threat in both eyes at close distance. Strength appears 5/5 in UE/LE bilaterally.           Labs on Admission:  Basic Metabolic Panel: No results for input(s): NA, K, CL, CO2, GLUCOSE, BUN, CREATININE, CALCIUM, MG, PHOS in the last 168 hours. Liver Function Tests: No results for input(s): AST, ALT, ALKPHOS, BILITOT, PROT, ALBUMIN in the last 168 hours. No results for input(s): LIPASE, AMYLASE in the last 168 hours. No results for input(s): AMMONIA in the last 168 hours. CBC: Recent Labs  Lab 05/04/21 0730   WBC 8.4  HGB 14.8  HCT 45.4  MCV 85.8  PLT 186   Cardiac Enzymes: No results for input(s): CKTOTAL, CKMB, CKMBINDEX, TROPONINI in the last 168 hours.  BNP (last 3 results) No results for input(s): BNP in the last 8760 hours.  ProBNP (last 3 results) No results for input(s): PROBNP in the last 8760 hours.  CBG: No results for input(s): GLUCAP in the last 168 hours.  Radiological Exams on Admission: MR BRAIN W WO CONTRAST  Result Date: 05/04/2021 CLINICAL DATA:  Weakness and blurry vision. EXAM: MRI HEAD WITH AND WITHOUT CONTRAST MRI CERVICAL AND THORACIC SPINE WITHOUT AND WITH CONTRAST TECHNIQUE: Multiplanar and multiecho pulse sequences of the head and cervical spine, to include the craniocervical junction and cervicothoracic junction, and the thoracic spine, were obtained without and with intravenous contrast. CONTRAST:  45mL GADAVIST GADOBUTROL 1 MMOL/ML IV SOLN COMPARISON:  None. FINDINGS: MRI BRAIN FINDINGS Brain: No acute infarct, mass effect or extra-axial collection. No acute or chronic hemorrhage. There is hyperintense T2-weighted signal within the medial thalami and periaqueductal gray matter. There is mild  contrast enhancement of the periaqueductal gray matter but no other enhancing lesion. The midline structures are normal. Vascular: Major flow voids are preserved. Skull and upper cervical spine: Normal calvarium and skull base. Visualized upper cervical spine and soft tissues are normal. Sinuses/Orbits:No paranasal sinus fluid levels or advanced mucosal thickening. No mastoid or middle ear effusion. Normal orbits. MRI CERVICAL SPINE FINDINGS Alignment: Physiologic. Vertebrae: No fracture, evidence of discitis, or bone lesion. Cord: Normal signal and morphology. No abnormal contrast enhancement. Posterior Fossa, vertebral arteries, paraspinal tissues: Negative. Disc levels: No spinal canal or neural foraminal stenosis. MRI THORACIC SPINE FINDINGS Alignment:  Physiologic. Vertebrae:  No fracture, evidence of discitis, or bone lesion. Cord: Normal signal and morphology. No abnormal contrast enhancement Paraspinal and other soft tissues: Negative. Disc levels: T7-8: Small right subarticular disc protrusion without stenosis. T8-9: Small left subarticular disc protrusion without stenosis. The other thoracic disc levels are normal. IMPRESSION: 1. Hyperintense T2-weighted signal and mild contrast enhancement within the medial thalami and periaqueductal gray matter. Demyelinating disease remains a primary consideration. MRI of the orbits with and without contrast might be helpful for better characterization of the optic nerves. CSF sampling should also be considered. 2. No demyelinating lesions of the cervical or thoracic spinal cord. Electronically Signed   By: Deatra Robinson M.D.   On: 05/04/2021 02:42   MR CERVICAL SPINE W WO CONTRAST  Result Date: 05/04/2021 CLINICAL DATA:  Weakness and blurry vision. EXAM: MRI HEAD WITH AND WITHOUT CONTRAST MRI CERVICAL AND THORACIC SPINE WITHOUT AND WITH CONTRAST TECHNIQUE: Multiplanar and multiecho pulse sequences of the head and cervical spine, to include the craniocervical junction and cervicothoracic junction, and the thoracic spine, were obtained without and with intravenous contrast. CONTRAST:  68mL GADAVIST GADOBUTROL 1 MMOL/ML IV SOLN COMPARISON:  None. FINDINGS: MRI BRAIN FINDINGS Brain: No acute infarct, mass effect or extra-axial collection. No acute or chronic hemorrhage. There is hyperintense T2-weighted signal within the medial thalami and periaqueductal gray matter. There is mild contrast enhancement of the periaqueductal gray matter but no other enhancing lesion. The midline structures are normal. Vascular: Major flow voids are preserved. Skull and upper cervical spine: Normal calvarium and skull base. Visualized upper cervical spine and soft tissues are normal. Sinuses/Orbits:No paranasal sinus fluid levels or advanced mucosal thickening. No  mastoid or middle ear effusion. Normal orbits. MRI CERVICAL SPINE FINDINGS Alignment: Physiologic. Vertebrae: No fracture, evidence of discitis, or bone lesion. Cord: Normal signal and morphology. No abnormal contrast enhancement. Posterior Fossa, vertebral arteries, paraspinal tissues: Negative. Disc levels: No spinal canal or neural foraminal stenosis. MRI THORACIC SPINE FINDINGS Alignment:  Physiologic. Vertebrae: No fracture, evidence of discitis, or bone lesion. Cord: Normal signal and morphology. No abnormal contrast enhancement Paraspinal and other soft tissues: Negative. Disc levels: T7-8: Small right subarticular disc protrusion without stenosis. T8-9: Small left subarticular disc protrusion without stenosis. The other thoracic disc levels are normal. IMPRESSION: 1. Hyperintense T2-weighted signal and mild contrast enhancement within the medial thalami and periaqueductal gray matter. Demyelinating disease remains a primary consideration. MRI of the orbits with and without contrast might be helpful for better characterization of the optic nerves. CSF sampling should also be considered. 2. No demyelinating lesions of the cervical or thoracic spinal cord. Electronically Signed   By: Deatra Robinson M.D.   On: 05/04/2021 02:42   MR THORACIC SPINE W WO CONTRAST  Result Date: 05/04/2021 CLINICAL DATA:  Weakness and blurry vision. EXAM: MRI HEAD WITH AND WITHOUT CONTRAST MRI CERVICAL AND THORACIC SPINE WITHOUT  AND WITH CONTRAST TECHNIQUE: Multiplanar and multiecho pulse sequences of the head and cervical spine, to include the craniocervical junction and cervicothoracic junction, and the thoracic spine, were obtained without and with intravenous contrast. CONTRAST:  10mL GADAVIST GADOBUTROL 1 MMOL/ML IV SOLN COMPARISON:  None. FINDINGS: MRI BRAIN FINDINGS Brain: No acute infarct, mass effect or extra-axial collection. No acute or chronic hemorrhage. There is hyperintense T2-weighted signal within the medial  thalami and periaqueductal gray matter. There is mild contrast enhancement of the periaqueductal gray matter but no other enhancing lesion. The midline structures are normal. Vascular: Major flow voids are preserved. Skull and upper cervical spine: Normal calvarium and skull base. Visualized upper cervical spine and soft tissues are normal. Sinuses/Orbits:No paranasal sinus fluid levels or advanced mucosal thickening. No mastoid or middle ear effusion. Normal orbits. MRI CERVICAL SPINE FINDINGS Alignment: Physiologic. Vertebrae: No fracture, evidence of discitis, or bone lesion. Cord: Normal signal and morphology. No abnormal contrast enhancement. Posterior Fossa, vertebral arteries, paraspinal tissues: Negative. Disc levels: No spinal canal or neural foraminal stenosis. MRI THORACIC SPINE FINDINGS Alignment:  Physiologic. Vertebrae: No fracture, evidence of discitis, or bone lesion. Cord: Normal signal and morphology. No abnormal contrast enhancement Paraspinal and other soft tissues: Negative. Disc levels: T7-8: Small right subarticular disc protrusion without stenosis. T8-9: Small left subarticular disc protrusion without stenosis. The other thoracic disc levels are normal. IMPRESSION: 1. Hyperintense T2-weighted signal and mild contrast enhancement within the medial thalami and periaqueductal gray matter. Demyelinating disease remains a primary consideration. MRI of the orbits with and without contrast might be helpful for better characterization of the optic nerves. CSF sampling should also be considered. 2. No demyelinating lesions of the cervical or thoracic spinal cord. Electronically Signed   By: Deatra Robinson M.D.   On: 05/04/2021 02:42    EKG: Independently reviewed. None obtained in our system  Assessment/Plan Active Problems:   Optic neuritis   Hypothyroidism   Helicobacter pylori gastritis   History of hypertension   Renal insufficiency   #NMOSD Appreciate neurology consultation.  On  interview patient with many questions regarding prognosis, these were deferred to neurology expertise.  Treatment and work-up per their recommendations. - Follow-up MRI orbits - Start methylprednisolone 1000 mg daily x5 days - PT/OT consult, after discussion with nurse we will also order SLP consult - Follow-up specific NMO serologies - DVT prophylaxis with Lovenox - Daily PPI - We will need outpatient neurology follow-up  #CKD #Hypertension Patient appears to have baseline creatinine of around 1.5, unfortunately do not have any baseline values prior to April of this year.  Suspect this is secondary to hypertension, but still unusual in someone so young and in addition her blood pressures have been normal since arrival.  Has at times had a protein gap over 4, though not consistently, calcium levels on the whole of been normal, CBC with normal white count and unremarkable differentials, and she is quite young so I have a low suspicion for light chain disease.  Renovascular disease is another possibility, especially given age.  We will initiate routine work-up. - Bolus 1 L LR - Renal ultrasound - Urinalysis and spot urine protein creatinine ratio - Check hemoglobin A1c - Monitor blood pressure closely, low threshold to initiate treatment - Consider renal consult as outpatient follow-up pending the above work-up  #Known medical problems Subclinical hypothyroidism-Free T4 normal, no need for supplementation at this time  H. pylori infection-patient reports she took about 1 out of 2 weeks of treatment for this,  given ongoing nausea and pending SLP evaluation, will defer treatment of this issue until these other concerns have been better addressed  Code Status: Full code, presumed DVT Prophylaxis: Lovenox per Neuro Family Communication: Sister updated at bedside Disposition Plan: Inpatient, Med-Surg   Time spent: 62 min  Venora Maples MD/MP[facility]  Note:  This document  was prepared using Conservation officer, historic buildings and may include unintentional dictation errors.

## 2021-05-05 ENCOUNTER — Encounter (HOSPITAL_COMMUNITY): Payer: Self-pay | Admitting: Family Medicine

## 2021-05-05 ENCOUNTER — Inpatient Hospital Stay (HOSPITAL_COMMUNITY): Payer: Medicaid Other

## 2021-05-05 DIAGNOSIS — E43 Unspecified severe protein-calorie malnutrition: Secondary | ICD-10-CM | POA: Insufficient documentation

## 2021-05-05 DIAGNOSIS — I10 Essential (primary) hypertension: Secondary | ICD-10-CM

## 2021-05-05 LAB — CBC
HCT: 46.7 % — ABNORMAL HIGH (ref 36.0–46.0)
Hemoglobin: 15.1 g/dL — ABNORMAL HIGH (ref 12.0–15.0)
MCH: 28.2 pg (ref 26.0–34.0)
MCHC: 32.3 g/dL (ref 30.0–36.0)
MCV: 87.3 fL (ref 80.0–100.0)
Platelets: 148 10*3/uL — ABNORMAL LOW (ref 150–400)
RBC: 5.35 MIL/uL — ABNORMAL HIGH (ref 3.87–5.11)
RDW: 19.1 % — ABNORMAL HIGH (ref 11.5–15.5)
WBC: 7 10*3/uL (ref 4.0–10.5)
nRBC: 0 % (ref 0.0–0.2)

## 2021-05-05 LAB — TSH: TSH: 0.381 u[IU]/mL (ref 0.350–4.500)

## 2021-05-05 LAB — COMPREHENSIVE METABOLIC PANEL
ALT: 27 U/L (ref 0–44)
AST: 18 U/L (ref 15–41)
Albumin: 3.5 g/dL (ref 3.5–5.0)
Alkaline Phosphatase: 44 U/L (ref 38–126)
Anion gap: 10 (ref 5–15)
BUN: 23 mg/dL — ABNORMAL HIGH (ref 6–20)
CO2: 20 mmol/L — ABNORMAL LOW (ref 22–32)
Calcium: 8.9 mg/dL (ref 8.9–10.3)
Chloride: 114 mmol/L — ABNORMAL HIGH (ref 98–111)
Creatinine, Ser: 1.23 mg/dL — ABNORMAL HIGH (ref 0.44–1.00)
GFR, Estimated: >60 mL/min (ref >60)
Glucose, Bld: 140 mg/dL — ABNORMAL HIGH (ref 70–99)
Potassium: 4.2 mmol/L (ref 3.5–5.1)
Sodium: 144 mmol/L (ref 135–145)
Total Bilirubin: 1.3 mg/dL — ABNORMAL HIGH (ref 0.3–1.2)
Total Protein: 6.8 g/dL (ref 6.5–8.1)

## 2021-05-05 LAB — PREGNANCY, URINE: Preg Test, Ur: NEGATIVE

## 2021-05-05 MED ORDER — SUCRALFATE 1 GM/10ML PO SUSP
1.0000 g | Freq: Three times a day (TID) | ORAL | Status: DC
  Filled 2021-05-05 (×13): qty 10

## 2021-05-05 MED ORDER — THIAMINE HCL 100 MG/ML IJ SOLN
250.0000 mg | Freq: Three times a day (TID) | INTRAVENOUS | Status: AC
  Administered 2021-05-08 – 2021-05-10 (×7): 250 mg via INTRAVENOUS
  Filled 2021-05-05 (×9): qty 2.5

## 2021-05-05 MED ORDER — GADOBUTROL 1 MMOL/ML IV SOLN
6.5000 mL | Freq: Once | INTRAVENOUS | Status: AC | PRN
  Administered 2021-05-05: 10 mL via INTRAVENOUS

## 2021-05-05 MED ORDER — ONDANSETRON 4 MG PO TBDP: 4.0000 mg | ORAL_TABLET | Freq: Three times a day (TID) | ORAL | Status: DC | PRN

## 2021-05-05 MED ORDER — METOPROLOL TARTRATE 5 MG/5ML IV SOLN
5.0000 mg | Freq: Four times a day (QID) | INTRAVENOUS | Status: DC | PRN
  Administered 2021-05-05 – 2021-05-11 (×2): 5 mg via INTRAVENOUS
  Filled 2021-05-05 (×2): qty 5

## 2021-05-05 MED ORDER — SODIUM CHLORIDE 0.9 % IV SOLN: INTRAVENOUS | Status: AC

## 2021-05-05 MED ORDER — LABETALOL HCL 5 MG/ML IV SOLN: 5.0000 mg | INTRAVENOUS | Status: DC | PRN

## 2021-05-05 MED ORDER — LEVOTHYROXINE SODIUM 100 MCG PO TABS: 100.0000 ug | ORAL_TABLET | Freq: Every day | ORAL | Status: DC

## 2021-05-05 MED ORDER — PANTOPRAZOLE SODIUM 40 MG PO TBEC
40.0000 mg | DELAYED_RELEASE_TABLET | Freq: Two times a day (BID) | ORAL | Status: DC
  Filled 2021-05-05: qty 1

## 2021-05-05 MED ORDER — METOPROLOL SUCCINATE ER 25 MG PO TB24
25.0000 mg | ORAL_TABLET | Freq: Every day | ORAL | Status: DC
  Administered 2021-05-11 – 2021-05-17 (×6): 25 mg via ORAL
  Filled 2021-05-05 (×9): qty 1

## 2021-05-05 MED ORDER — PANTOPRAZOLE SODIUM 40 MG IV SOLR
40.0000 mg | Freq: Two times a day (BID) | INTRAVENOUS | Status: DC
  Administered 2021-05-05 – 2021-05-07 (×5): 40 mg via INTRAVENOUS
  Filled 2021-05-05 (×5): qty 40

## 2021-05-05 MED ORDER — ORAL CARE MOUTH RINSE: 15.0000 mL | Freq: Two times a day (BID) | OROMUCOSAL | Status: DC

## 2021-05-05 MED ORDER — THIAMINE HCL 100 MG/ML IJ SOLN
500.0000 mg | Freq: Three times a day (TID) | INTRAVENOUS | Status: AC
  Administered 2021-05-05 – 2021-05-07 (×9): 500 mg via INTRAVENOUS
  Filled 2021-05-05 (×10): qty 5

## 2021-05-05 NOTE — Progress Notes (Signed)
[facility] PROGRESS NOTE    Progress Note  Diane Todd  MHD:[id number] DOB: [birth year] DOA: 05/03/2021 PCP: Pcp, No     Brief Narrative:   Diane Todd is an 20 y.o. female past medical history of essential hypertension hypothyroidism transferred from [facility] for concern after her MRI showed multiple brain abnormalities she has been having generalized weakness poor appetite and blurry vision.   Significant studies: 05/03/2021 MRI of the brain and spine show high intensity T2 weighted signal contrast enhancement of the medial thalami and periaqueductal. 05/03/2021 renal ultrasound showed no hydronephrosis. 05/05/2021 MRI of the orbit with and without contrast pending  Antibiotics: None  Microbiology data: Blood culture:  Procedures: None  Assessment/Plan:   Optic neuritis Consulted recommended serum for AQP4-IgG antibody and MOG-IgG antibody test  - HIV serology negative - Methylprednisolone 1,000mg  x 5 days without an oral taper . - PPI daily. - PT/OT evaluation. Further management per neurology. Check a pregnancy test to see Robaxin  Essential hypertension: Slightly Elevated currently on metoprolol. KVO IV fluids.  Chronic kidney disease stage III: Her creatinine appears to be at baseline. Discontinue Robaxin continue Tylenol for pain.  Hypothyroidism Check TSH.    DVT prophylaxis: lovenox Family Communication: Sister at bedside Status is: Inpatient  Remains inpatient appropriate because:Hemodynamically unstable  Dispo: The patient is from: Home              Anticipated d/c is to: Home              Patient currently is not medically stable to d/c.   Difficult to place patient No        Code Status:     Code Status Orders  (From admission, onward)           Start     Ordered   05/04/21 0802  Full code  Continuous        05/04/21 0801           Code Status History     Date Active Date Inactive Code  Status Order ID Comments User Context   05/04/2021 0309 05/04/2021 0801 Full Code [id number]  Rica Mote, MD Inpatient         IV Access:   Peripheral IV   Procedures and diagnostic studies:   MR BRAIN W WO CONTRAST  Result Date: 05/04/2021 CLINICAL DATA:  Weakness and blurry vision. EXAM: MRI HEAD WITH AND WITHOUT CONTRAST MRI CERVICAL AND THORACIC SPINE WITHOUT AND WITH CONTRAST TECHNIQUE: Multiplanar and multiecho pulse sequences of the head and cervical spine, to include the craniocervical junction and cervicothoracic junction, and the thoracic spine, were obtained without and with intravenous contrast. CONTRAST:  61mL GADAVIST GADOBUTROL 1 MMOL/ML IV SOLN COMPARISON:  None. FINDINGS: MRI BRAIN FINDINGS Brain: No acute infarct, mass effect or extra-axial collection. No acute or chronic hemorrhage. There is hyperintense T2-weighted signal within the medial thalami and periaqueductal gray matter. There is mild contrast enhancement of the periaqueductal gray matter but no other enhancing lesion. The midline structures are normal. Vascular: Major flow voids are preserved. Skull and upper cervical spine: Normal calvarium and skull base. Visualized upper cervical spine and soft tissues are normal. Sinuses/Orbits:No paranasal sinus fluid levels or advanced mucosal thickening. No mastoid or middle ear effusion. Normal orbits. MRI CERVICAL SPINE FINDINGS Alignment: Physiologic. Vertebrae: No fracture, evidence of discitis, or bone lesion. Cord: Normal signal and morphology. No abnormal contrast enhancement. Posterior Fossa, vertebral arteries, paraspinal tissues: Negative. Disc levels: No spinal  canal or neural foraminal stenosis. MRI THORACIC SPINE FINDINGS Alignment:  Physiologic. Vertebrae: No fracture, evidence of discitis, or bone lesion. Cord: Normal signal and morphology. No abnormal contrast enhancement Paraspinal and other soft tissues: Negative. Disc levels: T7-8: Small right subarticular  disc protrusion without stenosis. T8-9: Small left subarticular disc protrusion without stenosis. The other thoracic disc levels are normal. IMPRESSION: 1. Hyperintense T2-weighted signal and mild contrast enhancement within the medial thalami and periaqueductal gray matter. Demyelinating disease remains a primary consideration. MRI of the orbits with and without contrast might be helpful for better characterization of the optic nerves. CSF sampling should also be considered. 2. No demyelinating lesions of the cervical or thoracic spinal cord. Electronically Signed   By: Deatra Robinson M.D.   On: 05/04/2021 02:42   MR CERVICAL SPINE W WO CONTRAST  Result Date: 05/04/2021 CLINICAL DATA:  Weakness and blurry vision. EXAM: MRI HEAD WITH AND WITHOUT CONTRAST MRI CERVICAL AND THORACIC SPINE WITHOUT AND WITH CONTRAST TECHNIQUE: Multiplanar and multiecho pulse sequences of the head and cervical spine, to include the craniocervical junction and cervicothoracic junction, and the thoracic spine, were obtained without and with intravenous contrast. CONTRAST:  62mL GADAVIST GADOBUTROL 1 MMOL/ML IV SOLN COMPARISON:  None. FINDINGS: MRI BRAIN FINDINGS Brain: No acute infarct, mass effect or extra-axial collection. No acute or chronic hemorrhage. There is hyperintense T2-weighted signal within the medial thalami and periaqueductal gray matter. There is mild contrast enhancement of the periaqueductal gray matter but no other enhancing lesion. The midline structures are normal. Vascular: Major flow voids are preserved. Skull and upper cervical spine: Normal calvarium and skull base. Visualized upper cervical spine and soft tissues are normal. Sinuses/Orbits:No paranasal sinus fluid levels or advanced mucosal thickening. No mastoid or middle ear effusion. Normal orbits. MRI CERVICAL SPINE FINDINGS Alignment: Physiologic. Vertebrae: No fracture, evidence of discitis, or bone lesion. Cord: Normal signal and morphology. No abnormal  contrast enhancement. Posterior Fossa, vertebral arteries, paraspinal tissues: Negative. Disc levels: No spinal canal or neural foraminal stenosis. MRI THORACIC SPINE FINDINGS Alignment:  Physiologic. Vertebrae: No fracture, evidence of discitis, or bone lesion. Cord: Normal signal and morphology. No abnormal contrast enhancement Paraspinal and other soft tissues: Negative. Disc levels: T7-8: Small right subarticular disc protrusion without stenosis. T8-9: Small left subarticular disc protrusion without stenosis. The other thoracic disc levels are normal. IMPRESSION: 1. Hyperintense T2-weighted signal and mild contrast enhancement within the medial thalami and periaqueductal gray matter. Demyelinating disease remains a primary consideration. MRI of the orbits with and without contrast might be helpful for better characterization of the optic nerves. CSF sampling should also be considered. 2. No demyelinating lesions of the cervical or thoracic spinal cord. Electronically Signed   By: Deatra Robinson M.D.   On: 05/04/2021 02:42   MR THORACIC SPINE W WO CONTRAST  Result Date: 05/04/2021 CLINICAL DATA:  Weakness and blurry vision. EXAM: MRI HEAD WITH AND WITHOUT CONTRAST MRI CERVICAL AND THORACIC SPINE WITHOUT AND WITH CONTRAST TECHNIQUE: Multiplanar and multiecho pulse sequences of the head and cervical spine, to include the craniocervical junction and cervicothoracic junction, and the thoracic spine, were obtained without and with intravenous contrast. CONTRAST:  25mL GADAVIST GADOBUTROL 1 MMOL/ML IV SOLN COMPARISON:  None. FINDINGS: MRI BRAIN FINDINGS Brain: No acute infarct, mass effect or extra-axial collection. No acute or chronic hemorrhage. There is hyperintense T2-weighted signal within the medial thalami and periaqueductal gray matter. There is mild contrast enhancement of the periaqueductal gray matter but no other enhancing lesion. The midline  structures are normal. Vascular: Major flow voids are  preserved. Skull and upper cervical spine: Normal calvarium and skull base. Visualized upper cervical spine and soft tissues are normal. Sinuses/Orbits:No paranasal sinus fluid levels or advanced mucosal thickening. No mastoid or middle ear effusion. Normal orbits. MRI CERVICAL SPINE FINDINGS Alignment: Physiologic. Vertebrae: No fracture, evidence of discitis, or bone lesion. Cord: Normal signal and morphology. No abnormal contrast enhancement. Posterior Fossa, vertebral arteries, paraspinal tissues: Negative. Disc levels: No spinal canal or neural foraminal stenosis. MRI THORACIC SPINE FINDINGS Alignment:  Physiologic. Vertebrae: No fracture, evidence of discitis, or bone lesion. Cord: Normal signal and morphology. No abnormal contrast enhancement Paraspinal and other soft tissues: Negative. Disc levels: T7-8: Small right subarticular disc protrusion without stenosis. T8-9: Small left subarticular disc protrusion without stenosis. The other thoracic disc levels are normal. IMPRESSION: 1. Hyperintense T2-weighted signal and mild contrast enhancement within the medial thalami and periaqueductal gray matter. Demyelinating disease remains a primary consideration. MRI of the orbits with and without contrast might be helpful for better characterization of the optic nerves. CSF sampling should also be considered. 2. No demyelinating lesions of the cervical or thoracic spinal cord. Electronically Signed   By: Deatra RobinsonKevin  Herman M.D.   On: 05/04/2021 02:42   US RENAL  Result Date: 05/04/2021 CLINICAL DATA:  Chronic renal disease. EXAM: RENAL / URINARY TRACT ULTRASOUND COMPLETE COMPARISON:  CT abdomen and pelvis March 14, 2021. FINDINGS: Right Kidney: Renal measurements: 10.2 x 4.2 x 5.0 cm = volume: 112.0 mL. Mild renal cortical thinning. No hydronephrosis. Left Kidney: Renal measurements: May 0.4 x 4.5 x 3.9 cm = volume: 76.7 mL. Mild renal cortical thinning. No hydronephrosis. Bladder: Decompressed with Foley catheter.  Other: None. IMPRESSION: No hydronephrosis. Electronically Signed   By: Annia Beltrew  Davis M.D.   On: 05/04/2021 12:58     Medical Consultants:   None.   Subjective:    Diane Todd she is very sleepy this morning but has no new complaints.  Objective:    Vitals:   05/04/21 1600 05/04/21 1945 05/04/21 2324 05/05/21 0402  BP: 123/70 115/85 (!) 148/99 (!) 142/97  Pulse: 98 86 89 86  Resp: 20 19 19 16   Temp: 98.4 F (36.9 C) 98.9 F (37.2 C) 98.3 F (36.8 C) 98.4 F (36.9 C)  TempSrc: Oral Oral Oral Oral  SpO2: 97% 98% 100% 99%  Height:       SpO2: 99 %   Intake/Output Summary (Last 24 hours) at 05/05/2021 0749 Last data filed at 05/05/2021 0631 Gross per 24 hour  Intake 1771.27 ml  Output 800 ml  Net 971.27 ml   There were no vitals filed for this visit.  Exam: General exam: In no acute distress. Respiratory system: Good air movement and clear to auscultation. Cardiovascular system: S1 & S2 heard, RRR. No JVD. Gastrointestinal system: Abdomen is nondistended, soft and nontender.  Central nervous system: Awake alert oriented x1 sleepy this morning Extremities: No pedal edema. Skin: No rashes, lesions or ulcers   Data Reviewed:    Labs: Basic Metabolic Panel: Recent Labs  Lab 05/04/21 0730 05/05/21 0223  NA 144 144  K 3.9 4.2  CL 113* 114*  CO2 19* 20*  GLUCOSE 134* 140*  BUN 25* 23*  CREATININE 1.56* 1.23*  CALCIUM 9.1 8.9   GFR CrCl cannot be calculated (Unknown ideal weight.). Liver Function Tests: Recent Labs  Lab 05/04/21 0730 05/05/21 0223  AST 21 18  ALT 30 27  ALKPHOS 46 44  BILITOT  1.5* 1.3*  PROT 6.7 6.8  ALBUMIN 3.5 3.5   No results for input(s): LIPASE, AMYLASE in the last 168 hours. No results for input(s): AMMONIA in the last 168 hours. Coagulation profile No results for input(s): INR, PROTIME in the last 168 hours. COVID-19 Labs  No results for input(s): DDIMER, FERRITIN, LDH, CRP in the last 72 hours.  No results  found for: SARSCOV2NAA  CBC: Recent Labs  Lab 05/04/21 0730 05/05/21 0223  WBC 8.4 7.0  HGB 14.8 15.1*  HCT 45.4 46.7*  MCV 85.8 87.3  PLT 186 148*   Cardiac Enzymes: No results for input(s): CKTOTAL, CKMB, CKMBINDEX, TROPONINI in the last 168 hours. BNP (last 3 results) No results for input(s): PROBNP in the last 8760 hours. CBG: Recent Labs  Lab 05/04/21 1950  GLUCAP 134*   D-Dimer: No results for input(s): DDIMER in the last 72 hours. Hgb A1c: No results for input(s): HGBA1C in the last 72 hours. Lipid Profile: No results for input(s): CHOL, HDL, LDLCALC, TRIG, CHOLHDL, LDLDIRECT in the last 72 hours. Thyroid function studies: No results for input(s): TSH, T4TOTAL, T3FREE, THYROIDAB in the last 72 hours.  Invalid input(s): FREET3 Anemia work up: No results for input(s): VITAMINB12, FOLATE, FERRITIN, TIBC, IRON, RETICCTPCT in the last 72 hours. Sepsis Labs: Recent Labs  Lab 05/04/21 0730 05/05/21 0223  WBC 8.4 7.0   Microbiology No results found for this or any previous visit (from the past 240 hour(s)).   Medications:    Chlorhexidine Gluconate Cloth  6 each Topical Daily   docusate sodium  100 mg Oral BID   enoxaparin (LOVENOX) injection  40 mg Subcutaneous Daily   feeding supplement  1 Container Oral TID BM   mouth rinse  15 mL Mouth Rinse BID   multivitamin with minerals  1 tablet Oral Daily   pantoprazole (PROTONIX) IV  40 mg Intravenous Q24H   Continuous Infusions:  sodium chloride 100 mL/hr at 05/05/21 0400   methocarbamol (ROBAXIN) IV     methylPREDNISolone (SOLU-MEDROL) injection 1,000 mg (05/04/21 1044)      LOS: 2 days   Marinda Elk  [facility]  05/05/2021, 7:49 AM

## 2021-05-05 NOTE — Evaluation (Signed)
Occupational Therapy Evaluation Patient Details Name: Diane Todd MRN: [id number] DOB: 07/17/2001 Today's Date: 05/05/2021    History of Present Illness Pt is 20 yo female who presents from [facility] on 05/03/21 with worsening blurry vision and difficulty walking. She has had 2 months of N&V and was dx with H pylori gastritis and treated at [facility]. 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 PMH: HTN, thyroid disorder.   Clinical Impression   Pt PTA: Pt living with family and was requiring assist from family for iADL tasks, but performing own ADL. Pt had become progressively weaker until unable to stand or walk. Pt currently very limited by decreased arousal, decreased visual acuity/perception, decreased strength and decreased ability to care for self. Pt's sister in room and very knowledgable about pt's PLOF.  Pt unable to read digital clock on wall nor OTR's badge information stating it was "blurry." OTR brought taped glasses, but they did not appear to help. Pt modA to totalA for ADL and totalA for bed mobility. Pt would greatly benefit from continued OT skilled services. OT following acutely.    Follow Up Recommendations  CIR    Equipment Recommendations  3 in 1 bedside commode;Tub/shower seat;Wheelchair (measurements OT);Wheelchair cushion (measurements OT)    Recommendations for Other Services Rehab consult     Precautions / Restrictions Precautions Precautions: Fall Precaution Comments: pt fell at home PTA Restrictions Weight Bearing Restrictions: No      Mobility Bed Mobility               General bed mobility comments: Unable to test; pt fatigued and about to go for lumbar puncture/MRI.    Transfers                 General transfer comment: DNT    Balance                                           ADL either performed or assessed with clinical judgement   ADL Overall ADL's : Needs  assistance/impaired Eating/Feeding: NPO   Grooming: Maximal assistance;Bed level   Upper Body Bathing: Total assistance;Bed level   Lower Body Bathing: Total assistance   Upper Body Dressing : Maximal assistance   Lower Body Dressing: Total assistance   Toilet Transfer: Total assistance;+2 for physical assistance;+2 for safety/equipment Toilet Transfer Details (indicate cue type and reason): staff to use lift equip Toileting- Clothing Manipulation and Hygiene: Total assistance       Functional mobility during ADLs: Total assistance;+2 for physical assistance;+2 for safety/equipment General ADL Comments: Pt very limited by decreased arousal, decreased visual acuity/perception, decreased strength and decreased ability to care for self. Pt's sister in room and very knowledgable about pt's PLOF.     Vision Baseline Vision/History: No visual deficits Patient Visual Report: Blurring of vision;Central vision impairment;Diplopia Vision Assessment?: Vision impaired- to be further tested in functional context;Yes Eye Alignment: Impaired (comment) (upward gaze) Ocular Range of Motion: Restricted looking up;Impaired-to be further tested in functional context Alignment/Gaze Preference: Head tilt;Gaze right Tracking/Visual Pursuits: Decreased smoothness of horizontal tracking;Impaired - to be further tested in functional context;Requires cues, head turns, or add eye shifts to track Additional Comments: continue to assess, pt very fatigued; pt able to see OTR's finger and correctly count them in central visual field, superior and inferior fields. Pt unable to read digital clock on  wall nor OTR's badge information stating it was "blurry." OTR brought taped glasses, but they did not appear to help. Pt kept eyes closed for most of session.     Perception     Praxis      Pertinent Vitals/Pain Pain Assessment: No/denies pain     Hand Dominance Left   Extremity/Trunk Assessment Upper Extremity  Assessment Upper Extremity Assessment: Generalized weakness;RUE deficits/detail;LUE deficits/detail RUE Deficits / Details: Limited to 80* FF, elbow ROM limited by IV, wrist and hand flex/ext, WFLs, poor grip; 3+/5  strength RUE Coordination: decreased fine motor;decreased gross motor LUE Deficits / Details: shoulder FF limited to 80*; AROM elbow through digits, WFLs fair grip strength 4-/5 throughout LUE Coordination: decreased fine motor;decreased gross motor   Lower Extremity Assessment Lower Extremity Assessment: Generalized weakness;RLE deficits/detail;LLE deficits/detail;Defer to PT evaluation RLE Deficits / Details: decreased ROM RLE Sensation:  ("tingling") LLE Sensation:  ("tingling") LLE Coordination: decreased fine motor;decreased gross motor   Cervical / Trunk Assessment Cervical / Trunk Assessment: Other exceptions Cervical / Trunk Exceptions: no perceived trunk control   Communication Communication Communication: Expressive difficulties   Cognition Arousal/Alertness: Lethargic Behavior During Therapy: Flat affect Overall Cognitive Status: Difficult to assess Area of Impairment: Attention;Memory;Following commands;Safety/judgement;Awareness;Problem solving                   Current Attention Level: Focused Memory: Decreased short-term memory Following Commands: Follows one step commands inconsistently;Follows one step commands with increased time Safety/Judgement: Decreased awareness of safety Awareness: Intellectual Problem Solving: Slow processing;Decreased initiation;Difficulty sequencing;Requires verbal cues;Requires tactile cues General Comments: Pt keeping eyes closed, but following commands.   General Comments  Pt's sister in room    Exercises     Shoulder Instructions      Home Living Family/patient expects to be discharged to:: Private residence Living Arrangements: Parent;Other relatives (sister) Available Help at Discharge: Family;Available  24 hours/day Type of Home: House Home Access: Stairs to enter [employer] of Steps: 5   Home Layout: One level     Bathroom Shower/Tub: Tub/shower unit         Home Equipment: Wheelchair - manual;Shower seat   Additional Comments: pt's older sister was present on eval and answered many questions as pt was very lethargic.      Prior Functioning/Environment Level of Independence: Independent        Comments: prior to 4/22 pt was working at [employer] and going to [employer] Since 4/22 pt has been mostly in bed and getting weaker and weaker, family has been helping with ambulation and ADL's        OT Problem List: Decreased strength;Decreased range of motion;Decreased activity tolerance;Impaired balance (sitting and/or standing);Impaired vision/perception;Decreased coordination;Decreased cognition;Decreased safety awareness;Pain;Impaired UE functional use;Increased edema;Impaired sensation      OT Treatment/Interventions: Self-care/ADL training;Therapeutic exercise;Neuromuscular education;Energy conservation;Therapeutic activities;Cognitive remediation/compensation;Patient/family education;Balance training;Visual/perceptual remediation/compensation    OT Goals(Current goals can be found in the care plan section) Acute Rehab OT Goals Patient Stated Goal: be able to walk OT Goal Formulation: With patient/family Time For Goal Achievement: 05/19/21 Potential to Achieve Goals: Good ADL Goals Pt Will Perform Grooming: with min assist;sitting Pt Will Perform Upper Body Dressing: with min assist;sitting Pt Will Transfer to Toilet: with max assist;with +2 assist;squat pivot transfer;bedside commode Pt/caregiver will Perform Home Exercise Program: Increased strength;Both right and left upper extremity;With minimal assist;With written HEP provided Additional ADL Goal #1: Pt will use compensatory strategies to increase visual competence in order to perform ADL and mobility tasks with  minimal cues.  Additional ADL Goal #2: Pt will increase to x3 mins sitting at EOB with modA for stability supports in prep for OOB ADL.  OT Frequency: Min 2X/week   Barriers to D/C:            Co-evaluation              AM-PAC OT "6 Clicks" Daily Activity     Outcome Measure Help from another person eating meals?: Total Help from another person taking care of personal grooming?: A Lot Help from another person toileting, which includes using toliet, bedpan, or urinal?: Total Help from another person bathing (including washing, rinsing, drying)?: Total Help from another person to put on and taking off regular upper body clothing?: A Lot Help from another person to put on and taking off regular lower body clothing?: Total 6 Click Score: 8   End of Session Nurse Communication: Mobility status  Activity Tolerance: Patient limited by fatigue;Patient limited by lethargy Patient left: in bed;with call bell/phone within reach;with bed alarm set;with family/visitor present  OT Visit Diagnosis: Unsteadiness on feet (R26.81);Muscle weakness (generalized) (M62.81);Other symptoms and signs involving cognitive function;Low vision, both eyes (H54.2);Pain Pain - Right/Left: Right Pain - part of body: Arm (at IV site)                Time: 9030-0923 OT Time Calculation (min): 16 min Charges:  OT General Charges $OT Visit: 1 Visit OT Evaluation $OT Eval Moderate Complexity: 1 Mod   Flora Lipps, OTR/L Acute Rehabilitation Services Pager: [phone] Office: [phone]   Elizer Bostic C 05/05/2021, 8:16 PM

## 2021-05-05 NOTE — Significant Event (Signed)
Rapid Response Event Note   Reason for Call :  Called due to patient having unresponsiveness and decorticate posturing  Initial Focused Assessment:  Alert and oriented but wont open eyes.  Generalized weakness throughout.  Able to follow commands.  Back to baseline upon my arrival.  VSS CBG 134  Per sister pt is confused but was able to answer questions for this RN Prior to arrival had Primary RN called code stroke due to AMS and responsiveness and met RN there  Interventions:  Code Stroke cancelled per Dr Theda Sers as he is familiar with patient  Plan of Care:  Neuro Theda Sers MD to see patient.  Event Summary:   MD Notified: 2011 Call [id number] Arrival Time: 1955 End Time: 2020  Diane Quale, RN

## 2021-05-05 NOTE — Progress Notes (Signed)
Inpatient Rehab Admissions Coordinator Note:   Per PT recommendation, pt was screened for CIR candidacy by Wolfgang Phoenix, MS, CCC-SLP.  At this time we are recommending an inpatient rehab consult.  AC will place consult order per protocol.  Please contact me with questions.     Wolfgang Phoenix, MS, CCC-SLP Admissions Coordinator [phone] 05/05/21 4:35 PM

## 2021-05-05 NOTE — Progress Notes (Addendum)
Subjective: Today will be 2/5 of IV methylprednisolone treatment.  Objective: Current vital signs: BP (!) 142/97 (BP Location: Left Arm)   Pulse 86   Temp 98.4 F (36.9 C) (Oral)   Resp 16   Ht 5\' 7"  (1.702 m)   SpO2 99%   BMI 38.37 kg/m  Vital signs in last 24 hours: Temp:  [98.3 F (36.8 C)-98.9 F (37.2 C)] 98.4 F (36.9 C) (06/19 0402) Pulse Rate:  [86-98] 86 (06/19 0402) Resp:  [16-20] 16 (06/19 0402) BP: (115-148)/(70-99) 142/97 (06/19 0402) SpO2:  [97 %-100 %] 99 % (06/19 0402)  Intake/Output from previous day: 06/18 0701 - 06/19 0700 In: 1771.3 [I.V.:1713.3; IV Piggyback:58] Out: 800 [Urine:800] Intake/Output this shift: No intake/output data recorded. Nutritional status:  Diet Order             Diet NPO time specified  Diet effective now                  HEENT: [place]/AT Lungs: Respirations unlabored Ext: No edema  Neurologic Exam: Mental Status: Drowsy with decreased level of alertness. Able to follow all commands. Increased latencies of verbal and motor responses to commands. Speech is slow. Not oriented to day, but oriented to year. Not oriented to the hospital, city or state [place] Speech is non-dysarthric. Fluency intact. Comprehension intact. Cranial Nerves: II:  Visual acuity is 20/200 OU. PERR 2mm >> 1.5 mm.  III,IV, VI: Bilateral ptosis is noted. EOM with paresis of right eye worse than left. Left eye with sllow movements and unable to fully traverse medially, laterally or vertically. Right eye with slow movements and unable to adduct past the midline to the left; also with more impaired upgaze on the right relative to the left. There is intermittent coarse nystagmus triggered by upgaze.  VII: Smile symmetric VIII: hearing intact to voice IX,X: No hoarseness XI: Head is midline XII: midline tongue extension  Motor: 5/5 BUE with slow movements.  4+/5 BLE with slow movements and increased tone Clonus with forced dorsiflexion of right ankle.   Sensory: Light touch intact throughout, bilaterally Deep Tendon Reflexes:  2+ bilateral brachioradialis 4+ right patellar (clonus), 3+ left patellar 4+ right achilles (clonus) 2+ left achilles Toes downgoing Cerebellar: Positive for ataxia with FNF bilaterally  Gait: Unable to assess    Lab Results: Results for orders placed or performed during the hospital encounter of 05/03/21 (from the past 48 hour(s))  HIV Antibody (routine testing w rflx)     Status: None   Collection Time: 05/04/21 12:07 AM  Result Value Ref Range   HIV Screen 4th Generation wRfx Non Reactive Non Reactive    Comment: Performed at [facility], [address] [place], [place] [address]  CBC     Status: Abnormal   Collection Time: 05/04/21  7:30 AM  Result Value Ref Range   WBC 8.4 4.0 - 10.5 K/uL   RBC 5.29 (H) 3.87 - 5.11 MIL/uL   Hemoglobin 14.8 12.0 - 15.0 g/dL   HCT 60.445.4 54.036.0 - 98.146.0 %   MCV 85.8 80.0 - 100.0 fL   MCH 28.0 26.0 - 34.0 pg   MCHC 32.6 30.0 - 36.0 g/dL   RDW 19.118.9 (H) 47.811.5 - 29.515.5 %   Platelets 186 150 - 400 K/uL   nRBC 0.2 0.0 - 0.2 %    Comment: Performed at [facility], [address] [place], [place] [address]  Comprehensive metabolic panel     Status: Abnormal  Collection Time: 05/04/21  7:30 AM  Result Value Ref Range   Sodium 144 135 - 145 mmol/L   Potassium 3.9 3.5 - 5.1 mmol/L   Chloride 113 (H) 98 - 111 mmol/L   CO2 19 (L) 22 - 32 mmol/L   Glucose, Bld 134 (H) 70 - 99 mg/dL    Comment: Glucose reference range applies only to samples taken after fasting for at least 8 hours.   BUN 25 (H) 6 - 20 mg/dL   Creatinine, Ser 1.61 (H) 0.44 - 1.00 mg/dL   Calcium 9.1 8.9 - 09.6 mg/dL   Total Protein 6.7 6.5 - 8.1 g/dL   Albumin 3.5 3.5 - 5.0 g/dL   AST 21 15 - 41 U/L   ALT 30 0 - 44 U/L   Alkaline Phosphatase 46 38 - 126 U/L   Total Bilirubin 1.5 (H) 0.3 - 1.2 mg/dL   GFR, Estimated 49 (L) >60 mL/min    Comment: (NOTE) Calculated using the CKD-EPI Creatinine  Equation (2021)    Anion gap 12 5 - 15    Comment: Performed at [facility], [address] [place], [place] [address]  Urinalysis, Routine w reflex microscopic Urine, Catheterized     Status: Abnormal   Collection Time: 05/04/21  2:24 PM  Result Value Ref Range   Color, Urine YELLOW YELLOW   APPearance HAZY (A) CLEAR   Specific Gravity, Urine 1.024 1.005 - 1.030   pH 5.0 5.0 - 8.0   Glucose, UA 50 (A) NEGATIVE mg/dL   Hgb urine dipstick LARGE (A) NEGATIVE   Bilirubin Urine NEGATIVE NEGATIVE   Ketones, ur 20 (A) NEGATIVE mg/dL   Protein, ur 30 (A) NEGATIVE mg/dL   Nitrite POSITIVE (A) NEGATIVE   Leukocytes,Ua MODERATE (A) NEGATIVE   RBC / HPF >50 (H) 0 - 5 RBC/hpf   WBC, UA >50 (H) 0 - 5 WBC/hpf   Bacteria, UA MANY (A) NONE SEEN   Squamous Epithelial / LPF 0-5 0 - 5   Mucus PRESENT    Hyaline Casts, UA PRESENT     Comment: Performed at [facility], [address] [place], [place] [address]  Protein / creatinine ratio, urine     Status: Abnormal   Collection Time: 05/04/21  2:25 PM  Result Value Ref Range   Creatinine, Urine 152.45 mg/dL   Total Protein, Urine 66 mg/dL    Comment: NO NORMAL RANGE ESTABLISHED FOR THIS TEST   Protein Creatinine Ratio 0.43 (H) 0.00 - 0.15 mg/mg[Cre]    Comment: Performed at [facility], [address] [place], [place] [address]  Glucose, capillary     Status: Abnormal   Collection Time: 05/04/21  7:50 PM  Result Value Ref Range   Glucose-Capillary 134 (H) 70 - 99 mg/dL    Comment: Glucose reference range applies only to samples taken after fasting for at least 8 hours.  Comprehensive metabolic panel     Status: Abnormal   Collection Time: 05/05/21  2:23 AM  Result Value Ref Range   Sodium 144 135 - 145 mmol/L   Potassium 4.2 3.5 - 5.1 mmol/L   Chloride 114 (H) 98 - 111 mmol/L   CO2 20 (L) 22 - 32 mmol/L   Glucose, Bld 140 (H) 70 - 99 mg/dL    Comment: Glucose reference range applies only to samples taken after fasting  for at least 8 hours.   BUN 23 (H) 6 - 20 mg/dL   Creatinine, Ser  1.23 (H) 0.44 - 1.00 mg/dL   Calcium 8.9 8.9 - 27.0 mg/dL   Total Protein 6.8 6.5 - 8.1 g/dL   Albumin 3.5 3.5 - 5.0 g/dL   AST 18 15 - 41 U/L   ALT 27 0 - 44 U/L   Alkaline Phosphatase 44 38 - 126 U/L   Total Bilirubin 1.3 (H) 0.3 - 1.2 mg/dL   GFR, Estimated >62 >37 mL/min    Comment: (NOTE) Calculated using the CKD-EPI Creatinine Equation (2021)    Anion gap 10 5 - 15    Comment: Performed at [facility], [address] [place], [place] [address]  CBC     Status: Abnormal   Collection Time: 05/05/21  2:23 AM  Result Value Ref Range   WBC 7.0 4.0 - 10.5 K/uL   RBC 5.35 (H) 3.87 - 5.11 MIL/uL   Hemoglobin 15.1 (H) 12.0 - 15.0 g/dL   HCT 51.7 (H) 61.6 - 07.3 %   MCV 87.3 80.0 - 100.0 fL   MCH 28.2 26.0 - 34.0 pg   MCHC 32.3 30.0 - 36.0 g/dL   RDW 71.0 (H) 62.6 - 94.8 %   Platelets 148 (L) 150 - 400 K/uL   nRBC 0.0 0.0 - 0.2 %    Comment: Performed at [facility], [address], [place], [place] [address]    No results found for this or any previous visit (from the past 240 hour(s)).  Lipid Panel No results for input(s): CHOL, TRIG, HDL, CHOLHDL, VLDL, LDLCALC in the last 72 hours.  Studies/Results: MR BRAIN W WO CONTRAST  Result Date: 05/04/2021 CLINICAL DATA:  Weakness and blurry vision. EXAM: MRI HEAD WITH AND WITHOUT CONTRAST MRI CERVICAL AND THORACIC SPINE WITHOUT AND WITH CONTRAST TECHNIQUE: Multiplanar and multiecho pulse sequences of the head and cervical spine, to include the craniocervical junction and cervicothoracic junction, and the thoracic spine, were obtained without and with intravenous contrast. CONTRAST:  69mL GADAVIST GADOBUTROL 1 MMOL/ML IV SOLN COMPARISON:  None. FINDINGS: MRI BRAIN FINDINGS Brain: No acute infarct, mass effect or extra-axial collection. No acute or chronic hemorrhage. There is hyperintense T2-weighted signal within the medial thalami and periaqueductal gray  matter. There is mild contrast enhancement of the periaqueductal gray matter but no other enhancing lesion. The midline structures are normal. Vascular: Major flow voids are preserved. Skull and upper cervical spine: Normal calvarium and skull base. Visualized upper cervical spine and soft tissues are normal. Sinuses/Orbits:No paranasal sinus fluid levels or advanced mucosal thickening. No mastoid or middle ear effusion. Normal orbits. MRI CERVICAL SPINE FINDINGS Alignment: Physiologic. Vertebrae: No fracture, evidence of discitis, or bone lesion. Cord: Normal signal and morphology. No abnormal contrast enhancement. Posterior Fossa, vertebral arteries, paraspinal tissues: Negative. Disc levels: No spinal canal or neural foraminal stenosis. MRI THORACIC SPINE FINDINGS Alignment:  Physiologic. Vertebrae: No fracture, evidence of discitis, or bone lesion. Cord: Normal signal and morphology. No abnormal contrast enhancement Paraspinal and other soft tissues: Negative. Disc levels: T7-8: Small right subarticular disc protrusion without stenosis. T8-9: Small left subarticular disc protrusion without stenosis. The other thoracic disc levels are normal. IMPRESSION: 1. Hyperintense T2-weighted signal and mild contrast enhancement within the medial thalami and periaqueductal gray matter. Demyelinating disease remains a primary consideration. MRI of the orbits with and without contrast might be helpful for better characterization of the optic nerves. CSF sampling should also be considered. 2. No demyelinating lesions of the cervical or thoracic spinal cord. Electronically Signed   By: Caryn Bee  Chase Picket M.D.   On: 05/04/2021 02:42   MR CERVICAL SPINE W WO CONTRAST  Result Date: 05/04/2021 CLINICAL DATA:  Weakness and blurry vision. EXAM: MRI HEAD WITH AND WITHOUT CONTRAST MRI CERVICAL AND THORACIC SPINE WITHOUT AND WITH CONTRAST TECHNIQUE: Multiplanar and multiecho pulse sequences of the head and cervical spine, to include the  craniocervical junction and cervicothoracic junction, and the thoracic spine, were obtained without and with intravenous contrast. CONTRAST:  106mL GADAVIST GADOBUTROL 1 MMOL/ML IV SOLN COMPARISON:  None. FINDINGS: MRI BRAIN FINDINGS Brain: No acute infarct, mass effect or extra-axial collection. No acute or chronic hemorrhage. There is hyperintense T2-weighted signal within the medial thalami and periaqueductal gray matter. There is mild contrast enhancement of the periaqueductal gray matter but no other enhancing lesion. The midline structures are normal. Vascular: Major flow voids are preserved. Skull and upper cervical spine: Normal calvarium and skull base. Visualized upper cervical spine and soft tissues are normal. Sinuses/Orbits:No paranasal sinus fluid levels or advanced mucosal thickening. No mastoid or middle ear effusion. Normal orbits. MRI CERVICAL SPINE FINDINGS Alignment: Physiologic. Vertebrae: No fracture, evidence of discitis, or bone lesion. Cord: Normal signal and morphology. No abnormal contrast enhancement. Posterior Fossa, vertebral arteries, paraspinal tissues: Negative. Disc levels: No spinal canal or neural foraminal stenosis. MRI THORACIC SPINE FINDINGS Alignment:  Physiologic. Vertebrae: No fracture, evidence of discitis, or bone lesion. Cord: Normal signal and morphology. No abnormal contrast enhancement Paraspinal and other soft tissues: Negative. Disc levels: T7-8: Small right subarticular disc protrusion without stenosis. T8-9: Small left subarticular disc protrusion without stenosis. The other thoracic disc levels are normal. IMPRESSION: 1. Hyperintense T2-weighted signal and mild contrast enhancement within the medial thalami and periaqueductal gray matter. Demyelinating disease remains a primary consideration. MRI of the orbits with and without contrast might be helpful for better characterization of the optic nerves. CSF sampling should also be considered. 2. No demyelinating  lesions of the cervical or thoracic spinal cord. Electronically Signed   By: Deatra Robinson M.D.   On: 05/04/2021 02:42   MR THORACIC SPINE W WO CONTRAST  Result Date: 05/04/2021 CLINICAL DATA:  Weakness and blurry vision. EXAM: MRI HEAD WITH AND WITHOUT CONTRAST MRI CERVICAL AND THORACIC SPINE WITHOUT AND WITH CONTRAST TECHNIQUE: Multiplanar and multiecho pulse sequences of the head and cervical spine, to include the craniocervical junction and cervicothoracic junction, and the thoracic spine, were obtained without and with intravenous contrast. CONTRAST:  58mL GADAVIST GADOBUTROL 1 MMOL/ML IV SOLN COMPARISON:  None. FINDINGS: MRI BRAIN FINDINGS Brain: No acute infarct, mass effect or extra-axial collection. No acute or chronic hemorrhage. There is hyperintense T2-weighted signal within the medial thalami and periaqueductal gray matter. There is mild contrast enhancement of the periaqueductal gray matter but no other enhancing lesion. The midline structures are normal. Vascular: Major flow voids are preserved. Skull and upper cervical spine: Normal calvarium and skull base. Visualized upper cervical spine and soft tissues are normal. Sinuses/Orbits:No paranasal sinus fluid levels or advanced mucosal thickening. No mastoid or middle ear effusion. Normal orbits. MRI CERVICAL SPINE FINDINGS Alignment: Physiologic. Vertebrae: No fracture, evidence of discitis, or bone lesion. Cord: Normal signal and morphology. No abnormal contrast enhancement. Posterior Fossa, vertebral arteries, paraspinal tissues: Negative. Disc levels: No spinal canal or neural foraminal stenosis. MRI THORACIC SPINE FINDINGS Alignment:  Physiologic. Vertebrae: No fracture, evidence of discitis, or bone lesion. Cord: Normal signal and morphology. No abnormal contrast enhancement Paraspinal and other soft tissues: Negative. Disc levels: T7-8: Small right subarticular disc protrusion without stenosis.  T8-9: Small left subarticular disc protrusion  without stenosis. The other thoracic disc levels are normal. IMPRESSION: 1. Hyperintense T2-weighted signal and mild contrast enhancement within the medial thalami and periaqueductal gray matter. Demyelinating disease remains a primary consideration. MRI of the orbits with and without contrast might be helpful for better characterization of the optic nerves. CSF sampling should also be considered. 2. No demyelinating lesions of the cervical or thoracic spinal cord. Electronically Signed   By: Deatra Robinson M.D.   On: 05/04/2021 02:42   US RENAL  Result Date: 05/04/2021 CLINICAL DATA:  Chronic renal disease. EXAM: RENAL / URINARY TRACT ULTRASOUND COMPLETE COMPARISON:  CT abdomen and pelvis March 14, 2021. FINDINGS: Right Kidney: Renal measurements: 10.2 x 4.2 x 5.0 cm = volume: 112.0 mL. Mild renal cortical thinning. No hydronephrosis. Left Kidney: Renal measurements: May 0.4 x 4.5 x 3.9 cm = volume: 76.7 mL. Mild renal cortical thinning. No hydronephrosis. Bladder: Decompressed with Foley catheter. Other: None. IMPRESSION: No hydronephrosis. Electronically Signed   By: Annia Belt M.D.   On: 05/04/2021 12:58    Medications: Scheduled:  Chlorhexidine Gluconate Cloth  6 each Topical Daily   docusate sodium  100 mg Oral BID   enoxaparin (LOVENOX) injection  40 mg Subcutaneous Daily   levothyroxine  100 mcg Oral Daily   metoprolol succinate  25 mg Oral Daily   multivitamin with minerals  1 tablet Oral Daily   pantoprazole  40 mg Oral BID   sucralfate  1 g Oral TID AC & HS   Continuous:  sodium chloride 100 mL/hr at 05/05/21 0902   methylPREDNISolone (SOLU-MEDROL) injection 1,000 mg (05/04/21 1044)    Assessment:  20 y.o. 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 Review of her prior history in the chart shows that she has had persistent/chronic nausea and vomiting since April  2022. She also has symptoms of blurry vision with eye discomfort which occurred after the nausea and vomiting. 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 1. Exam today reveals asymmetric bilateral ophthalmoparesis, slowed mentation, ataxia, vision loss and RLE spasticity. 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 However, some of her symptoms and signs overlap with acute Wernicke encephalopathy (opthalmoparesis, nystagmus, ataxia and AMS). STAT thiamine level has been ordered, after which high dose IV thiamine will need to be started while awaiting lab results.   Recommendations: 1. STAT thiamine level (ordered) 2. After thiamine is drawn, start empiric high-dose IV thiamine 500 mg IV TID x 3 days, then 250 mg IV TID x 3 days, then 100 mg po qd thereafter 3. On day 2/5 IV methylprednisolone 4. Anti-Aquaporin4 Ab pending. MOG antibody also pending 5. MRI of orbits with and without contrast is pending 6. Will need Ophthalmology consult for dilated retinal exam 7. May need LP given her somnolence, which is atypical for NMO 8. Holding Lovenox in anticipation of LP. SCDs have been ordered. Restart Lovenox after LP. Discussed with Dr. David Stall.      LOS: 2 days   @Electronically  signed: Dr. 05/05/2021  9:02 AM

## 2021-05-05 NOTE — Progress Notes (Signed)
05/04/21 1940 Bedside shift report from [place], [place]. Pt lying in bed. After shift report attempted to wake pt. Pt hard to arouse, had to sternal rub to get pt to respond. Pt confused, per her sister Vikki Ports at bedside pt was more lethargic/confused this afternoon/evening. Pt noted to have decorticated posturing after sternal rubbing. Pt would not open eyes for RN. Assessment performed and vital signs taken. VS WNL. Pt would not withdraw legs bilaterally to pain. Marge, RN left room to call rapid response, this RN stayed with pt at bedside.   Per patient's sister this was a major change in behavior from today. Charge nurse Rocky Link, RN notified and at bedside. Pt is coming around a little bit and is able to follow commands to squeeze bilateral hands. Rapid response cancelled as pt is more arousable then previously suspected.  This RN continued to assess patient and spoke with sister at bedside who informed RN that pt was not confused prior, also not this lethargic. RN decided to re-call Rapid Response. Rapid response informed RN to call a code stroke for change in mental status/assessment.   RN called code stroke, awaiting neuro to come to bedside.   2000 H. Collins, Neuro at bedside to evaluate pt. After examining pt explained that the change in assessment most likely due to the high dose steroids given to the patient today. No new orders received. RN will continue to monitor pt closely. Fall precautions in place, sister at bedside, WCTM.   2200 RN into room, attempted to medicate pt but pt too lethargic to take anything by mouth at this time. Pt bathed, CHG bath given, full linen change. Pt tolerated well, teeth brushed. Fall precautions in place, [facility].  2330 RN into room to re-assess pt and check VS. Pt more easily arousable, able to follow more commands for for RN. VSS, WCTM.

## 2021-05-06 DIAGNOSIS — H469 Unspecified optic neuritis: Secondary | ICD-10-CM | POA: Diagnosis not present

## 2021-05-06 MED ORDER — DEXTROSE-NACL 5-0.45 % IV SOLN: INTRAVENOUS | Status: AC

## 2021-05-06 MED ORDER — LIDOCAINE HCL 1 % IJ SOLN
5.0000 mL | Freq: Once | INTRAMUSCULAR | Status: AC | PRN
  Administered 2021-05-08: 5 mL
  Filled 2021-05-06 (×2): qty 5

## 2021-05-06 NOTE — Plan of Care (Signed)
Plan of care note from Neurology:    NO CHARGE.   Patient slated for LP tomorrow. NP was going to call sister but sister is in the room. Sister states there is a significant language barrier for the patient's mother. NP did notice that Hospitalist did call patient's mother today and discussed risk of LP and mother consented.   NP to bedside. Sister also states that patient's mother was OK with the LP. Explained risk to sister, including: risk of infection/meningitis but the LP area is prepped and is sterile, minimal bleeding risk, risk of CSF leak and that 1% of patients require a procedure called a blood patch to repair the leak, the risk of HA post procedure which we limit by having the patient lie flat after the LP. The sister states the patient has never had any problems with "numbing" medicine.   Consent signed and left at bedside with LP tray.  Her DVT ppx is being held. SCDs.  Jimmye Norman, MSN, APN-BC NP neurology

## 2021-05-06 NOTE — Consult Note (Signed)
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 No h/o eye surgeries/eye exam, no prior use of glasses/contact lenses.  Objective: [place] ([place]): OD 20/400, OS 20/400 IOP (Tp, pt squeezing): OD 31, OS 42 mmHg though feels normal to palpation OU Pupils: ERRL (3-->25mm OU, neg APD OU) EOM: severely limited in all directions OU with end-gaze nystagmus OU with bilateral abduction  CVF: full to CF OU  Anterior segment (penlight exam): appears wnl OU  Dilation w/ 1%tropicamide/2.5% phenylephrine OU at 5:17pm  Dilated fundus exam (28D):  Vitreous: clear OU Optic nerve: 1+ peripapillary retinal edema OU w/ 2 disc hemorrhages OD and no disc hemorrhage OS, c/d OD 0.25 and c/d OS 0.15 Macula: flat OU Vessels: normal OU Periphery: flat/attached OU (though limited exam based on limited motility OU)  A/P: 1. Blurry vision OU- concerning for NMOSD vs other demyelinating disorder, multiple abnormalities on prior MRI brain, BP appears fairly normal despite 2 disc hemorrhages OD, peripapillary retinal edema appears concerning for elevated ICP, has pending LP scheduled 05/07/21, can reconsult ophthalmology PRN given minimal fundus findings, recommend cont f/u w/ neurology for further management to check for AQP4-IgG serum Abs as well as MRI brain/orbits/cervical-thoraci spine, would benefit from possible neuro-ophthalomology follow-up as outpatient at [facility],  2. Ocular hypertension OU- pt squeezing during IOP measurement, small c/d, low risk for glaucoma, may observe for now and when f/u as outpatient with neuroophthalmology can recheck IOP to determine if need IOP-lowering drops.  Deno Etienne, MD 05/06/21 5:51pm

## 2021-05-06 NOTE — Progress Notes (Signed)
[facility] PROGRESS NOTE    Progress Note  Diane Todd  HER:[id number] DOB: [birth year] DOA: 05/03/2021 PCP: Pcp, No     Brief Narrative:   Diane Todd is an 20 y.o. female past medical history of essential hypertension hypothyroidism transferred from [facility] for concern after her MRI showed multiple brain abnormalities she has been having generalized weakness poor appetite and blurry vision.   Significant studies: 05/03/2021 MRI of the brain and spine show high intensity T2 weighted signal contrast enhancement of the medial thalami and periaqueductal. 05/03/2021 renal ultrasound showed no hydronephrosis. 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 - HIV serology negative - Methylprednisolone 1,000mg  x 5 days without an oral taper . -Start thiamine high-dose IV -Physical therapy evaluation recommended inpatient rehab Pregnancy test negative. MRI of the orbits acute findings show some subtle enhancement along the periaqueductal a matter and medial thalami bilaterally. Need ophthalmology consult  Essential hypertension: Blood pressures well controlled.  Acute Kidney injury on chronic kidney disease stage III: Unknown baseline creatinine her creatinine is improving slowly.  Hypothyroidism TSH of 0.3.    DVT prophylaxis: lovenox Family Communication: Sister at bedside Status is: Inpatient  Remains inpatient appropriate because:Hemodynamically unstable  Dispo: The patient is from: Home              Anticipated d/c is to: Home              Patient currently is not medically stable to d/c.   Difficult to place patient No        Code  Status:     Code Status Orders  (From admission, onward)           Start     Ordered   05/04/21 0802  Full code  Continuous        05/04/21 0801           Code Status History     Date Active Date Inactive Code Status Order ID Comments User Context   05/04/2021 0309 05/04/2021 0801 Full Code [id number]  Rica Mote, MD Inpatient         IV Access:   Peripheral IV   Procedures and diagnostic studies:   US RENAL  Result Date: 05/04/2021 CLINICAL DATA:  Chronic renal disease. EXAM: RENAL / URINARY TRACT ULTRASOUND COMPLETE COMPARISON:  CT abdomen and pelvis March 14, 2021. FINDINGS: Right Kidney: Renal measurements: 10.2 x 4.2 x 5.0 cm = volume: 112.0 mL. Mild renal cortical thinning. No hydronephrosis. Left Kidney: Renal measurements: May 0.4 x 4.5 x 3.9 cm = volume: 76.7 mL. Mild renal cortical thinning. No hydronephrosis. Bladder: Decompressed with Foley catheter. Other: None. IMPRESSION: No hydronephrosis. Electronically Signed   By: Annia Belt M.D.   On: 05/04/2021 12:58   MR ORBITS W WO CONTRAST  Result Date: 05/05/2021 CLINICAL DATA:  Optic neuritis suspected. EXAM: MRI OF THE ORBITS WITHOUT AND WITH CONTRAST TECHNIQUE: Multiplanar, multi-echo pulse sequences of the orbits and surrounding structures were acquired including fat saturation techniques, before and after intravenous contrast administration. CONTRAST:  13mL GADAVIST GADOBUTROL 1 MMOL/ML IV SOLN COMPARISON:  Abnormal MRI of the brain. FINDINGS: Orbits: Globes are within normal limits bilaterally. The optic nerves are unremarkable. Chiasm is within  normal limits. Abnormal signal or enhancement is present. Extraocular muscles are within normal limits. Visualized sinuses: Polyp or mucous retention cyst is noted in the left maxillary sinus. The paranasal sinuses and mastoid air cells are otherwise clear. Soft tissues: Periorbital soft tissues and visualized facial soft tissues are within normal limits. 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 There is subtle enhancement along the periaqueductal gray matter and medial thalami. IMPRESSION: 1. Normal MRI appearance of the orbits. 2. Subtle enhancement along the periaqueductal gray matter and medial thalami bilaterally. This corresponds to the abnormal signal changes on the MRI from yesterday. In addition to a demyelinating process, or Wernicke encephalopathy (thiamine deficiency encephalopathy) is also considered. Electronically Signed   By: Marin Roberts M.D.   On: 05/05/2021 13:25     Medical Consultants:   None.   Subjective:    Diane Todd continues to be sleepy able to follow commands.  Objective:    Vitals:   05/05/21 2034 05/06/21 0025 05/06/21 0348 05/06/21 0900  BP: (!) 107/58 125/78 104/67 117/79  Pulse: 66 70 62 (!) 55  Resp: 18 18 16 20   Temp: 99.2 F (37.3 C) (!) 97.4 F (36.3 C) 98 F (36.7 C) 97.6 F (36.4 C)  TempSrc: Oral Oral  Oral  SpO2: 96% 96% 96% 97%  Weight:      Height:       SpO2: 97 %   Intake/Output Summary (Last 24 hours) at 05/06/2021 1005 Last data filed at 05/06/2021 0647 Gross per 24 hour  Intake 1981 ml  Output 1300 ml  Net 681 ml    Filed Weights   05/05/21 1300  Weight: 98.6 kg    Exam: General exam: In no acute distress. Respiratory system: Good air movement and clear to auscultation. Cardiovascular system: S1 & S2 heard, RRR. No JVD. Gastrointestinal system: Abdomen is nondistended, soft and nontender.  Extremities: No pedal edema. Skin: No rashes, lesions or ulcers  Data Reviewed:    Labs: Basic Metabolic Panel: Recent Labs  Lab 05/04/21 0730 05/05/21 0223  NA 144 144  K 3.9 4.2  CL 113* 114*  CO2 19* 20*  GLUCOSE 134* 140*  BUN 25* 23*  CREATININE 1.56* 1.23*  CALCIUM 9.1 8.9    GFR Estimated Creatinine Clearance: 88 mL/min (A) (by C-G formula based on SCr of 1.23 mg/dL (H)). Liver Function Tests: Recent  Labs  Lab 05/04/21 0730 05/05/21 0223  AST 21 18  ALT 30 27  ALKPHOS 46 44  BILITOT 1.5* 1.3*  PROT 6.7 6.8  ALBUMIN 3.5 3.5    No results for input(s): LIPASE, AMYLASE in the last 168 hours. No results for input(s): AMMONIA in the last 168 hours. Coagulation profile No results for input(s): INR, PROTIME in the last 168 hours. COVID-19 Labs  No results for input(s): DDIMER, FERRITIN, LDH, CRP in the last 72 hours.  No results found for: SARSCOV2NAA  CBC: Recent Labs  Lab 05/04/21 0730 05/05/21 0223  WBC 8.4 7.0  HGB 14.8 15.1*  HCT 45.4 46.7*  MCV 85.8 87.3  PLT 186 148*    Cardiac Enzymes: No results for input(s): CKTOTAL, CKMB, CKMBINDEX, TROPONINI in the last 168 hours. BNP (last 3 results) No results for input(s): PROBNP in the last 8760 hours. CBG: Recent Labs  Lab 05/04/21 1950  GLUCAP 134*    D-Dimer: No results for input(s): DDIMER in the last 72 hours. Hgb A1c: No results for input(s): HGBA1C in the last  72 hours. Lipid Profile: No results for input(s): CHOL, HDL, LDLCALC, TRIG, CHOLHDL, LDLDIRECT in the last 72 hours. Thyroid function studies: Recent Labs    05/05/21 0830  TSH 0.381   Anemia work up: No results for input(s): VITAMINB12, FOLATE, FERRITIN, TIBC, IRON, RETICCTPCT in the last 72 hours. Sepsis Labs: Recent Labs  Lab 05/04/21 0730 05/05/21 0223  WBC 8.4 7.0    Microbiology No results found for this or any previous visit (from the past 240 hour(s)).   Medications:    Chlorhexidine Gluconate Cloth  6 each Topical Daily   docusate sodium  100 mg Oral BID   levothyroxine  100 mcg Oral Daily   metoprolol succinate  25 mg Oral Daily   multivitamin with minerals  1 tablet Oral Daily   pantoprazole (PROTONIX) IV  40 mg Intravenous Q12H   sucralfate  1 g Oral TID AC & HS   Continuous Infusions:  methylPREDNISolone (SOLU-MEDROL) injection 1,000 mg (05/06/21 0925)   [START ON 05/08/2021] thiamine injection     thiamine  injection 500 mg (05/05/21 2129)      LOS: 3 days   Marinda Elk  [facility]  05/06/2021, 10:05 AM

## 2021-05-06 NOTE — Progress Notes (Addendum)
I spoke to the mom Diane Todd the mother of the patient, she is very concern about her daughter. She did express her appreciation of the medical and nursing  care her daughter is receiving. I did explained the risk and benefits of lumbar puncture and included but not limited to infection bleeding, getting paralyze and post lumbar puncture headache. She understand the risk and benefit of proceeding with his lumbar puncture, answer all her questions. She would like to proceed with LP. Also explained to her the treatment that she is on pain all her labs including imaging.

## 2021-05-06 NOTE — Consult Note (Signed)
Physical Medicine and Rehabilitation Consult Reason for Consult: Blurred vision Referring Physician: Dr. Robb Matar   HPI: Diane Todd is a 20 y.o. right-handed female with history of hypertension, CKD stage III as well as thyroid disorder.  Per chart review patient lives with parent.  Independent prior to admission.  1 level home 5 steps to entry.  Prior to 4/22 was working at [employer] going to [employer].  Presented 05/03/2021 with generalized weakness poor appetite /nausea and vomiting as well as blurred vision x2 weeks.  Initial work-up at outside hospital [facility] showed lesions highly suggestive of NMSOD.  MRI of the brain showed hyperintense T2 weighted signal and mild contrast-enhancement within the medial thalami and periaqueductal gray matter.  Demyelinating disease remained a primary consideration.  MRI of the orbits with and without contrast were considered.  CSF sampling was considered.  No demyelinating lesion of the cervical thoracic cord noted.  MRI of the orbits with and without contrast showed normal appearance of the orbits.  MRI cervical thoracic spine showed T7-8 small right subarticular disc protrusion without stenosis.  T8-9 small left subarticular disc protrusion without stenosis.  Renal ultrasound showed no hydronephrosis.  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  Therapy evaluations completed due to patient decreased functional mobility recommendations of physical medicine rehab consult. Sister at bedside would prefer CIR for patient.    Review of Systems  Constitutional:  Positive for malaise/fatigue. Negative for fever.  Eyes:  Positive for blurred vision. Negative for photophobia.  Respiratory:  Negative for cough and shortness of breath.   Cardiovascular:  Negative for chest pain, palpitations and leg swelling.  Gastrointestinal:  Positive for constipation, nausea and vomiting.  Genitourinary:  Negative for dysuria, flank pain and  hematuria.  Musculoskeletal:  Positive for myalgias.  Skin:  Negative for rash.  Neurological:  Positive for weakness.  All other systems reviewed and are negative. Past Medical History:  Diagnosis Date   Hypertension    Hypothyroidism    History reviewed. No pertinent surgical history. History reviewed. No pertinent family history. Social History:  reports that she has been smoking e-cigarettes. She has never used smokeless tobacco. She reports previous alcohol use. She reports previous drug use. Allergies: No Known Allergies Medications Prior to Admission  Medication Sig Dispense Refill   amoxicillin (AMOXIL) 500 MG capsule Take 1,000 mg by mouth every 12 (twelve) hours. Take for 14 days. (Patient not taking: Reported on 05/03/2021)     clarithromycin (BIAXIN) 500 MG tablet Take 500 mg by mouth every 12 (twelve) hours. Take for 14 days. (Patient not taking: Reported on 05/03/2021)     levothyroxine (SYNTHROID) 100 MCG tablet Take 100 mcg by mouth daily. (Patient not taking: Reported on 05/03/2021)     metoprolol succinate (TOPROL-XL) 25 MG 24 hr tablet Take 25 mg by mouth daily. (Patient not taking: Reported on 05/03/2021)     ondansetron (ZOFRAN ODT) 4 MG disintegrating tablet Take 1 tablet (4 mg total) by mouth every 8 (eight) hours as needed for nausea or vomiting. (Patient not taking: Reported on 05/03/2021) 10 tablet 0   pantoprazole (PROTONIX) 40 MG tablet Take 40 mg by mouth 2 (two) times daily. (Patient not taking: Reported on 05/03/2021)     sucralfate (CARAFATE) 1 g tablet Take 1 g by mouth every 6 (six) hours. Take for 14 days. (Patient not taking: Reported on 05/03/2021)      Home: Home Living Family/patient expects to be  discharged to:: Private residence Living Arrangements: Parent, Other relatives (sister) Available Help at Discharge: Family, Available 24 hours/day Type of Home: House Home Access: Stairs to enter [employer] of Steps: 5 Home Layout: One  level Bathroom Shower/Tub: Tub/shower unit Home Equipment: Wheelchair - manual, Shower seat Additional Comments: pt's older sister was present on eval and answered many questions as pt was very lethargic.  Functional History: Prior Function Level of Independence: Independent Comments: prior to 4/22 pt was working at [employer] and going to [employer] Since 4/22 pt has been mostly in bed and getting weaker and weaker, family has been helping with ambulation and ADL's Functional Status:  Mobility: Bed Mobility Overal bed mobility: Needs Assistance Bed Mobility: Supine to Sit Supine to sit: Max assist General bed mobility comments: Unable to test; pt fatigued and about to go for lumbar puncture/MRI. Transfers Overall transfer level: Needs assistance Transfers: Lateral/Scoot Transfers, Sit to/from Stand Sit to Stand: Max assist  Lateral/Scoot Transfers: Max assist General transfer comment: DNT Ambulation/Gait General Gait Details: unable    ADL: ADL Overall ADL's : Needs assistance/impaired Eating/Feeding: NPO Grooming: Maximal assistance, Bed level Upper Body Bathing: Total assistance, Bed level Lower Body Bathing: Total assistance Upper Body Dressing : Maximal assistance Lower Body Dressing: Total assistance Toilet Transfer: Total assistance, +2 for physical assistance, +2 for safety/equipment Toilet Transfer Details (indicate cue type and reason): staff to use lift equip Toileting- Clothing Manipulation and Hygiene: Total assistance Functional mobility during ADLs: Total assistance, +2 for physical assistance, +2 for safety/equipment General ADL Comments: Pt very limited by decreased arousal, decreased visual acuity/perception, decreased strength and decreased ability to care for self. Pt's sister in room and very knowledgable about pt's PLOF.  Cognition: Cognition Overall Cognitive Status: Difficult to assess Orientation Level: Oriented to person, Oriented to place, Disoriented to  time, Disoriented to situation Cognition Arousal/Alertness: Lethargic Behavior During Therapy: Flat affect Overall Cognitive Status: Difficult to assess Area of Impairment: Attention, Memory, Following commands, Safety/judgement, Awareness, Problem solving Current Attention Level: Focused Memory: Decreased short-term memory Following Commands: Follows one step commands inconsistently, Follows one step commands with increased time Safety/Judgement: Decreased awareness of safety Awareness: Intellectual Problem Solving: Slow processing, Decreased initiation, Difficulty sequencing, Requires verbal cues, Requires tactile cues General Comments: Pt keeping eyes closed, but following commands. Difficult to assess due to: Level of arousal  Blood pressure 117/79, pulse (!) 55, temperature 97.6 F (36.4 C), temperature source Oral, resp. rate 20, height 5\' 7"  (1.702 m), weight 98.6 kg, SpO2 97 %. Physical Exam Gen: no distress, normal appearing HEENT:  Bilateral ptosis, impaired EOM Cardio: Bradycardic Chest: normal effort, normal rate of breathing Abd: soft, non-distended Ext: no edema Psych: pleasant, normal affect Skin: intact Neurological:     Comments: Patient is alert in no acute distress.  Makes eye contact with examiner and follows commands.  Oriented to person and place. LE 4/5, UE 5/5. Impaired FTN b/l.   No results found for this or any previous visit (from the past 24 hour(s)). RENAL  Result Date: 05/04/2021 CLINICAL DATA:  Chronic renal disease. EXAM: RENAL / URINARY TRACT ULTRASOUND COMPLETE COMPARISON:  CT abdomen and pelvis March 14, 2021. FINDINGS: Right Kidney: Renal measurements: 10.2 x 4.2 x 5.0 cm = volume: 112.0 mL. Mild renal cortical thinning. No hydronephrosis. Left Kidney: Renal measurements: May 0.4 x 4.5 x 3.9 cm = volume: 76.7 mL. Mild renal cortical thinning. No hydronephrosis. Bladder: Decompressed with Foley catheter. Other: None. IMPRESSION: No hydronephrosis.  Electronically Signed  By: Annia Belt M.D.   On: 05/04/2021 12:58   MR ORBITS W WO CONTRAST  Result Date: 05/05/2021 CLINICAL DATA:  Optic neuritis suspected. EXAM: MRI OF THE ORBITS WITHOUT AND WITH CONTRAST TECHNIQUE: Multiplanar, multi-echo pulse sequences of the orbits and surrounding structures were acquired including fat saturation techniques, before and after intravenous contrast administration. CONTRAST:  40mL GADAVIST GADOBUTROL 1 MMOL/ML IV SOLN COMPARISON:  Abnormal MRI of the brain. FINDINGS: Orbits: Globes are within normal limits bilaterally. The optic nerves are unremarkable. Chiasm is within normal limits. Abnormal signal or enhancement is present. Extraocular muscles are within normal limits. Visualized sinuses: Polyp or mucous retention cyst is noted in the left maxillary sinus. The paranasal sinuses and mastoid air cells are otherwise clear. Soft tissues: Periorbital soft tissues and visualized facial soft tissues are within normal limits. 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 There is subtle enhancement along the periaqueductal gray matter and medial thalami. IMPRESSION: 1. Normal MRI appearance of the orbits. 2. Subtle enhancement along the periaqueductal gray matter and medial thalami bilaterally. This corresponds to the abnormal signal changes on the MRI from yesterday. In addition to a demyelinating process, or Wernicke encephalopathy (thiamine deficiency encephalopathy) is also considered. Electronically Signed   By: Marin Roberts M.D.   On: 05/05/2021 13:25     Assessment/Plan: Diagnosis: NMO Does the need for close, 24 hr/day medical supervision in concert with the patient's rehab needs make it unreasonable for this patient to be served in a less intensive setting? Yes Co-Morbidities requiring supervision/potential complications: bilateral ophtlmoparesis, slowed mentation, ataxia, vision loss, RLE spasticity Due to bladder  management, bowel management, safety, skin/wound care, disease management, medication administration, pain management, and patient education, does the patient require 24 hr/day rehab nursing? Yes Does the patient require coordinated care of a physician, rehab nurse, therapy disciplines of PT, OT, SLP to address physical and functional deficits in the context of the above medical diagnosis(es)? Yes Addressing deficits in the following areas: balance, endurance, locomotion, strength, transferring, bowel/bladder control, bathing, dressing, feeding, grooming, toileting, cognition, speech, language, and psychosocial support Can the patient actively participate in an intensive therapy program of at least 3 hrs of therapy per day at least 5 days per week? Yes The potential for patient to make measurable gains while on inpatient rehab is excellent Anticipated functional outcomes upon discharge from inpatient rehab are min assist  with PT, min assist with OT, min assist with SLP. Estimated rehab length of stay to reach the above functional goals is: 10-14 days Anticipated discharge destination: Home Overall Rehab/Functional Prognosis: good  RECOMMENDATIONS: This patient's condition is appropriate for continued rehabilitative care in the following setting: CIR Patient has agreed to participate in recommended program. Yes Note that insurance prior authorization may be required for reimbursement for recommended care.  Comment: Thank you for this consult. Admission coordinator to follow.   I have personally performed a face to face diagnostic evaluation, including, but not limited to relevant history and physical exam findings, of this patient and developed relevant assessment and plan.  Additionally, I have reviewed and concur with the physician assistant's documentation above.  Sula Soda, MD    Mcarthur Rossetti Angiulli, PA-C 05/06/2021

## 2021-05-07 ENCOUNTER — Inpatient Hospital Stay (HOSPITAL_COMMUNITY): Payer: Medicaid Other

## 2021-05-07 DIAGNOSIS — G360 Neuromyelitis optica [Devic]: Principal | ICD-10-CM

## 2021-05-07 LAB — BASIC METABOLIC PANEL
Anion gap: 6 (ref 5–15)
BUN: 21 mg/dL — ABNORMAL HIGH (ref 6–20)
CO2: 22 mmol/L (ref 22–32)
Calcium: 8.5 mg/dL — ABNORMAL LOW (ref 8.9–10.3)
Chloride: 116 mmol/L — ABNORMAL HIGH (ref 98–111)
Creatinine, Ser: 1.04 mg/dL — ABNORMAL HIGH (ref 0.44–1.00)
GFR, Estimated: >60 mL/min (ref >60)
Glucose, Bld: 143 mg/dL — ABNORMAL HIGH (ref 70–99)
Potassium: 3.7 mmol/L (ref 3.5–5.1)
Sodium: 144 mmol/L (ref 135–145)

## 2021-05-07 LAB — URINALYSIS, ROUTINE W REFLEX MICROSCOPIC
Bilirubin Urine: NEGATIVE
Glucose, UA: NEGATIVE mg/dL
Ketones, ur: NEGATIVE mg/dL
Leukocytes,Ua: NEGATIVE
Nitrite: NEGATIVE
Protein, ur: NEGATIVE mg/dL
Specific Gravity, Urine: 1.016 (ref 1.005–1.030)
pH: 5 (ref 5.0–8.0)

## 2021-05-07 NOTE — Plan of Care (Signed)
  Problem: Education: Goal: Knowledge of General Education information will improve Description Including pain rating scale, medication(s)/side effects and non-pharmacologic comfort measures Outcome: Progressing   Problem: Health Behavior/Discharge Planning: Goal: Ability to manage health-related needs will improve Outcome: Progressing   

## 2021-05-07 NOTE — Progress Notes (Signed)
Inpatient Rehabilitation Admissions Coordinator   I met at bedside with patient's sister. Patient asleep. I discussed goals and expectations of a possible CIR admit pending medical workup and patient's ability to participate in therapies at a CIR level. Noted LP pending. I will follow her progress to assist with dispo when appropriate.  Danne Baxter, RN, MSN Rehab Admissions Coordinator [phone] 05/07/2021 10:14 AM

## 2021-05-07 NOTE — Plan of Care (Signed)

## 2021-05-07 NOTE — Progress Notes (Signed)
[facility] PROGRESS NOTE    Progress Note  Sula Fetterly  DJM:[id number] DOB: Apr 22, 2001 DOA: 05/03/2021 PCP: Pcp, No     Brief Narrative:   Diane Todd is an 20 y.o. female past medical history of essential hypertension hypothyroidism transferred from [facility] for concern after her MRI showed multiple brain abnormalities she has been having generalized weakness poor appetite and blurry vision.   Significant studies: 05/03/2021 MRI of the brain and spine show high intensity T2 weighted signal contrast enhancement of the medial thalami and periaqueductal. 05/03/2021 renal ultrasound showed no hydronephrosis. 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 She will need 5 days of high-dose steroids and 3 days of high-dose thiamine and then can be switched to orals. Physical therapy evaluated the patient recommended inpatient rehab. Neurology was consulted and recommended LP which will be done on 05/07/2021. Pregnancy test negative. MRI of the orbits acute findings show some subtle enhancement along the periaqueductal a matter and medial thalami bilaterally. Neurology was consulted they did a funduscopic exam that showed papillary retinal edema appears concerning for elevated ICP they recommended no further intervention reconsult as needed, given minimal fundus finding he did recommend to follow-up neuro-ophthalmology at tertiary center. Patient is more sleepy today, she is rolling in the bed.But able to answer questions. She has a gag reflex vitals are stable.  Continue to monitor.   Essential hypertension: Blood pressures well controlled, on  metoprolol.  Acute Kidney injury on chronic kidney disease stage III: On IV fluids due to decreased oral intake. Basic metabolic panels pending.  Hypothyroidism TSH of 0.3.    DVT prophylaxis: lovenox off Lovenox for LP. Family Communication: Sister at bedside Status is: Inpatient  Remains inpatient appropriate because:Hemodynamically unstable  Dispo: The patient is from: Home              Anticipated d/c is to: Home              Patient currently is not medically stable to d/c.   Difficult to place patient No        Code Status:     Code Status Orders  (From admission, onward)           Start     Ordered   05/04/21 0802  Full code  Continuous        05/04/21 0801           Code Status History     Date Active Date Inactive Code Status Order ID Comments User Context   05/04/2021 0309 05/04/2021 0801 Full Code [id number]  Rica Mote, MD Inpatient         IV Access:   Peripheral IV   Procedures and diagnostic studies:   MR ORBITS W WO CONTRAST  Result Date: 05/05/2021 CLINICAL DATA:  Optic neuritis suspected. EXAM: MRI OF THE ORBITS WITHOUT AND WITH CONTRAST TECHNIQUE: Multiplanar, multi-echo pulse sequences of the orbits and surrounding structures were acquired including fat saturation techniques, before and after intravenous contrast administration. CONTRAST:  59mL GADAVIST GADOBUTROL 1 MMOL/ML IV SOLN COMPARISON:  Abnormal MRI of the brain. FINDINGS: Orbits: Globes are within normal limits bilaterally. The optic nerves are unremarkable. Chiasm is within normal limits. Abnormal signal or enhancement  is present. Extraocular muscles are within normal limits. Visualized sinuses: Polyp or mucous retention cyst is noted in the left maxillary sinus. The paranasal sinuses and mastoid air cells are otherwise clear. Soft tissues: Periorbital soft tissues and visualized facial soft tissues are within normal limits. 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 There is subtle enhancement along the periaqueductal gray matter and medial thalami. IMPRESSION: 1. Normal MRI appearance of the orbits. 2. Subtle enhancement along the periaqueductal gray matter and medial thalami bilaterally. This corresponds to the abnormal signal changes on the MRI from yesterday. In addition to a demyelinating process, or Wernicke encephalopathy (thiamine deficiency encephalopathy) is also considered. Electronically Signed   By: Marin Roberts M.D.   On: 05/05/2021 13:25     Medical Consultants:   None.   Subjective:    [name] able to follow commands continues to be sleeping and drooling.  Objective:    Vitals:   05/06/21 2040 05/07/21 0037 05/07/21 0456 05/07/21 0748  BP: (!) 102/56 114/84 122/81 122/77  Pulse: (!) 45 60 62 (!) 49  Resp: 18 20 18 18   Temp: 98.8 F (37.1 C) 98.2 F (36.8 C) 98.6 F (37 C) 98.4 F (36.9 C)  TempSrc: Oral Oral  Oral  SpO2: 96% 96% 98% 98%  Weight:      Height:       SpO2: 98 %   Intake/Output Summary (Last 24 hours) at 05/07/2021 0835 Last data filed at 05/06/2021 2300 Gross per 24 hour  Intake 694.9 ml  Output 1380 ml  Net -685.1 ml    Filed Weights   05/05/21 1300  Weight: 98.6 kg    Exam: General exam: In no acute distress. Respiratory system: Good air movement and clear to auscultation. Cardiovascular system: S1 & S2 heard, RRR. No JVD. Gastrointestinal system: Abdomen is nondistended, soft and nontender.  Extremities: No pedal edema. Skin: No rashes, lesions or ulcers  Data Reviewed:    Labs: Basic Metabolic Panel: Recent Labs  Lab 05/04/21 0730 05/05/21 0223  NA 144 144  K 3.9 4.2  CL 113* 114*  CO2 19* 20*  GLUCOSE 134* 140*  BUN 25* 23*  CREATININE 1.56* 1.23*  CALCIUM 9.1 8.9    GFR Estimated Creatinine Clearance: 88 mL/min (A) (by C-G formula based on SCr of 1.23 mg/dL (H)). Liver Function Tests: Recent Labs  Lab  05/04/21 0730 05/05/21 0223  AST 21 18  ALT 30 27  ALKPHOS 46 44  BILITOT 1.5* 1.3*  PROT 6.7 6.8  ALBUMIN 3.5 3.5    No results for input(s): LIPASE, AMYLASE in the last 168 hours. No results for input(s): AMMONIA in the last 168 hours. Coagulation profile No results for input(s): INR, PROTIME in the last 168 hours. COVID-19 Labs  No results for input(s): DDIMER, FERRITIN, LDH, CRP in the last 72 hours.  No results found for: SARSCOV2NAA  CBC: Recent Labs  Lab 05/04/21 0730 05/05/21 0223  WBC 8.4 7.0  HGB 14.8 15.1*  HCT 45.4 46.7*  MCV 85.8 87.3  PLT 186 148*    Cardiac Enzymes: No results for input(s): CKTOTAL, CKMB, CKMBINDEX, TROPONINI in the last 168 hours. BNP (last 3 results) No results for input(s): PROBNP in the last 8760 hours. CBG: Recent Labs  Lab 05/04/21 1950  GLUCAP 134*    D-Dimer: No results for input(s): DDIMER in the last 72 hours. Hgb A1c: No results for input(s): HGBA1C in the last 72 hours. Lipid Profile:  No results for input(s): CHOL, HDL, LDLCALC, TRIG, CHOLHDL, LDLDIRECT in the last 72 hours. Thyroid function studies: Recent Labs    05/05/21 0830  TSH 0.381    Anemia work up: No results for input(s): VITAMINB12, FOLATE, FERRITIN, TIBC, IRON, RETICCTPCT in the last 72 hours. Sepsis Labs: Recent Labs  Lab 05/04/21 0730 05/05/21 0223  WBC 8.4 7.0    Microbiology No results found for this or any previous visit (from the past 240 hour(s)).   Medications:    Chlorhexidine Gluconate Cloth  6 each Topical Daily   docusate sodium  100 mg Oral BID   levothyroxine  100 mcg Oral Daily   metoprolol succinate  25 mg Oral Daily   multivitamin with minerals  1 tablet Oral Daily   pantoprazole (PROTONIX) IV  40 mg Intravenous Q12H   sucralfate  1 g Oral TID AC & HS   Continuous Infusions:  dextrose 5 % and 0.45% NaCl 100 mL/hr at 05/06/21 2123   methylPREDNISolone (SOLU-MEDROL) injection 1,000 mg (05/06/21 0925)   [START ON  05/08/2021] thiamine injection     thiamine injection 500 mg (05/06/21 2213)      LOS: 4 days   Marinda Elk  [facility]  05/07/2021, 8:35 AM

## 2021-05-07 NOTE — Progress Notes (Signed)
LUMBAR PUNCTURE (SPINAL TAP) PROCEDURE NOTE  Indication: NMO with somnolence    Proceduralists: Jimmye Norman, NP. Dr. Ezzie Dural, proctor.    Risks of the procedure were dicussed with the patient including post-LP headache, bleeding, infection, weakness/numbness of legs(radiculopathy), death.    Consent obtained from: sister given language barrier. But mother also consented verbally on the phone to spanish speaking MD.    Procedure Note The patient was prepped and draped, and using sterile technique a 20 gauge quinke spinal needle was inserted in the L4-5 space.    Unsuccessful LP due to body habitus. Will get LP under fluoro.   Jimmye Norman, NP Neurology

## 2021-05-07 NOTE — Progress Notes (Signed)
Cross-coverage note:   Patient unable to urinate, has required in and out cath for >24 hours. Urine appeared turbid per nursing. Plan to place Foley and send sample for UA and culture.

## 2021-05-07 NOTE — Progress Notes (Signed)
SLP Cancellation Note  Patient Details Name: Diane Todd MRN: [id number] DOB: [birth year]   Cancelled treatment:       Reason Eval/Treat Not Completed: Patient at procedure or test/unavailable. Pt is NPO for LP today. Will f/u for dysphagia tx tomorrow.    Ignacio Lowder, Riley Nearing 05/07/2021, 8:42 AM

## 2021-05-07 NOTE — Progress Notes (Addendum)
NEUROLOGY CONSULTATION PROGRESS NOTE   Date of service: May 07, 2021 Patient Name: Diane Todd MRN:  [id number] DOB:  [birth year]  Brief HPI  Diane Todd is a 20 y.o. female with PMH significant for recent Hpylori infection, intractable nausea with poor po intake since April 2022, who initially presented to OSH on 6/16 with 2.5 weeks hx of blurred vision which progressed to ataxia. 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  MRI Brain and C spine with and w/o contrast with hyperintense T2-weighted signal and mild contrast enhancement within the medial thalami and  periaqueductal gray matter. MRI Orbits with no significant abnormality.  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  AQP4 Ab is pending, MOG Ab is pending.  Interval Hx   Sister at bedside, reports some improvement in somnolence but still far from baseline. Continues to have nausea vomiting.  Vitals   Vitals:   05/07/21 0037 05/07/21 0456 05/07/21 0748 05/07/21 1109  BP: 114/84 122/81 122/77 109/77  Pulse: 60 62 (!) 49 (!) 53  Resp: 20 18 18 17   Temp: 98.2 F (36.8 C) 98.6 F (37 C) 98.4 F (36.9 C) 98.2 F (36.8 C)  TempSrc: Oral  Oral Oral  SpO2: 96% 98% 98% 97%  Weight:      Height:         Body mass index is 34.05 kg/m.  Physical Exam   General: Laying comfortably in bed; in no acute distress. HENT: Normal oropharynx and mucosa. Normal external appearance of ears and nose. Neck: Supple, no pain or tenderness CV: No JVD. No peripheral edema. Pulmonary: Symmetric Chest rise. Normal respiratory effort. Abdomen: Soft to touch, non-tender. Ext: No cyanosis, edema, or deformity Skin: No rash. Normal palpation of skin.  Musculoskeletal: Normal digits and nails by inspection. No clubbing.  Neurologic Examination  Mental status/Cognition: somnolent, oriented to self, slowed and soft speech.  Cranial nerves:   CN  II Pupils equal and reactive to light, no VF deficits   CN III,IV,VI Opthalmoplegia, BL ptosis with impaired gaze in all directions. Let worse than right   CN V normal sensation in V1, V2, and V3 segments bilaterally   CN VII no asymmetry, no nasolabial fold flattening   CN VIII normal hearing to speech   CN IX & X normal palatal elevation, no uvular deviation   CN XI    CN XII midline tongue protrusion   Motor:  Muscle bulk: normal, tone normal Mvmt Root Nerve  Muscle Right Left Comments  SA C5/6 Ax Deltoid     EF C5/6 Mc Biceps 5 5   EE C6/7/8 Rad Triceps 5 5   WF C6/7 Med FCR     WE C7/8 PIN ECU     F Ab C8/T1 U ADM/FDI 4+ 4+   HF L1/2/3 Fem Illopsoas 4 4   KE L2/3/4 Fem Quad 4 4   DF L4/5 D Peron Tib Ant 5 5   PF S1/2 Tibial Grc/Sol 5 5    Sensation:  Light touch Intact throughout   Pin prick    Temperature    Vibration   Proprioception    Coordination/Complex Motor:  - Finger to Nose intact BL with mild ataxia. - Heel to shin unable to do. - Rapid alternating movement are slowed. - Gait: Deferred.  Labs   Basic Metabolic Panel:  Lab Results  Component Value Date   NA 144 05/07/2021  K 3.7 05/07/2021   CO2 22 05/07/2021   GLUCOSE 143 (H) 05/07/2021   BUN 21 (H) 05/07/2021   CREATININE 1.04 (H) 05/07/2021   CALCIUM 8.5 (L) 05/07/2021   GFRNONAA >60 05/07/2021   HbA1c: No results found for: HGBA1C LDL: No results found for: [facility] Urine Drug Screen: No results found for: LABOPIA, COCAINSCRNUR, LABBENZ, AMPHETMU, THCU, LABBARB  Alcohol Level No results found for: ETH No results found for: PHENYTOIN, ZONISAMIDE, LAMOTRIGINE, LEVETIRACETA No results found for: PHENYTOIN, PHENOBARB, VALPROATE, CBMZ  Imaging and Diagnostic studies   MRI Orbits with and without contrast: 1. Normal MRI appearance of the orbits. 2. Subtle enhancement along the periaqueductal gray matter and medial thalami bilaterally. This corresponds to the abnormal signal changes on the  MRI from yesterday. In addition to a demyelinating process, or Wernicke encephalopathy (thiamine deficiency encephalopathy) is also considered.  MRI Brain with and without cotnrast(personally reviewed): 1. Hyperintense T2-weighted signal and mild contrast enhancement within the medial thalami and periaqueductal gray matter. Demyelinating disease remains a primary consideration. MRI of the orbits with and without contrast might be helpful for better characterization of the optic nerves. CSF sampling should also be considered. 2. No demyelinating lesions of the cervical or thoracic spinal cord.  MRI C and T spine with and without contrast: No demyelinating lesions of the cervical or thoracic spinal cord.   Impression   Diane Todd is a 20 y.o. female with PMH significant for recent Hpylori infection, intractable nausea with poor po intake since April 2022, who initially presented to OSH on 6/16 with 2.5 weeks hx of blurred vision which progressed to ataxia. She had imaging at OSH suggestive of NMOS with T2/FLAIR hyperintensity in BL medial thalami and periaqueductal gray matter.  Involvement of the medial thalami might explain decreased arousal and involvement of the periaqueductal gray might explain her nausea.  There is some improvement in her somnolence but still continues to have significant nausea with minimal to no improvement and still has significant ophthalmoplegia with blurred vision.  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  Recommendations  - continue IV Solumedrol 1000mg  Daily x 5 days with last dose tonight, - Will start her on PLEX x 5 session(orders placed) - continue thiamine - AQP4 and MOG Ab pending. - Thiamine levels are pending. - We will continue to follow along. - Bedside LP unsuccessful, will get LP under fluoro.  Plan discussed with patient's sister at bedside and over phone. Plan discussed with Dr.  Ortix over secure chat.  _______________________________________________________________   Thank you for the opportunity to take part in the care of this patient. If you have any further questions, please contact the neurology consultation attending.  Signed,  Radonna Ricker [facility] Pager Number [phone]

## 2021-05-07 NOTE — Progress Notes (Signed)
Physical Therapy Treatment Patient Details Name: Diane Todd MRN: [id number] DOB: 23-Jan-2001 Today's Date: 05/07/2021    History of Present Illness Pt is 20 yo female who presents from [facility] on 05/03/21 with worsening blurry vision and difficulty walking. She has had 2 months of N&V and was dx with H pylori gastritis and treated at [facility]. 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 PMH: HTN, thyroid disorder.    PT Comments    Pt was seen for mobility on side of bed with help to balance and find midline.  Her sister was there to encourage her to work, and to increase her tolerance for sitting work.  PT and pt were interrupted by team arriving to do bedside LP, so returned her to bed.  Follow up as pt can tolerate next time, monitor precautions.  Follow Up Recommendations  CIR;Supervision/Assistance - 24 hour     Equipment Recommendations  Other (comment)    Recommendations for Other Services Rehab consult;OT consult;Speech consult     Precautions / Restrictions Precautions Precautions: Fall Precaution Comments: pt fell at home PTA Restrictions Weight Bearing Restrictions: No    Mobility  Bed Mobility Overal bed mobility: Needs Assistance Bed Mobility: Supine to Sit;Sit to Supine     Supine to sit: Max assist Sit to supine: Max assist   General bed mobility comments: pt only had short session as she was about to get LP    Transfers                 General transfer comment: deferred  Ambulation/Gait                 Stairs             Wheelchair Mobility    Modified Rankin (Stroke Patients Only)       Balance   Sitting-balance support: Feet supported;Bilateral upper extremity supported Sitting balance-Leahy Scale: Poor Sitting balance - Comments: min to mod assist to control balance and cues for posture                                    Cognition  Arousal/Alertness: Lethargic Behavior During Therapy: Flat affect Overall Cognitive Status: Difficult to assess Area of Impairment: Safety/judgement;Following commands;Attention;Memory                   Current Attention Level: Selective Memory: Decreased short-term memory Following Commands: Follows one step commands inconsistently Safety/Judgement: Decreased awareness of safety;Decreased awareness of deficits Awareness: Intellectual Problem Solving: Slow processing;Requires verbal cues;Requires tactile cues General Comments: Pt keeping eyes closed, but following commands.      Exercises      General Comments General comments (skin integrity, edema, etc.): pt is able to follow commands but is not opening eyes      Pertinent Vitals/Pain Pain Assessment: No/denies pain    Home Living                      Prior Function            PT Goals (current goals can now be found in the care plan section) Acute Rehab PT Goals Patient Stated Goal: to move again    Frequency    Min 3X/week      PT Plan Current plan remains appropriate    Co-evaluation  AM-PAC PT "6 Clicks" Mobility   Outcome Measure  Help needed turning from your back to your side while in a flat bed without using bedrails?: A Lot Help needed moving from lying on your back to sitting on the side of a flat bed without using bedrails?: A Lot Help needed moving to and from a bed to a chair (including a wheelchair)?: Total Help needed standing up from a chair using your arms (e.g., wheelchair or bedside chair)?: Total Help needed to walk in hospital room?: Total Help needed climbing 3-5 steps with a railing? : Total 6 Click Score: 8    End of Session   Activity Tolerance: Patient limited by lethargy Patient left: in bed;with call bell/phone within reach;with family/visitor present;with nursing/sitter in room Nurse Communication: Mobility status;Other (comment)  (repositioning for LP) PT Visit Diagnosis: Muscle weakness (generalized) (M62.81);History of falling (Z91.81);Difficulty in walking, not elsewhere classified (R26.2)     Time: 0349-1791 PT Time Calculation (min) (ACUTE ONLY): 13 min  Charges:  $Therapeutic Activity: 8-22 mins             Ivar Drape 05/07/2021, 8:06 PM  Samul Dada, PT MS Acute Rehab Dept. Number: [facility] [phone] and [facility] [phone]

## 2021-05-08 ENCOUNTER — Inpatient Hospital Stay (HOSPITAL_COMMUNITY): Payer: Medicaid Other

## 2021-05-08 HISTORY — PX: IR FLUORO GUIDE CV LINE RIGHT: IMG2283

## 2021-05-08 HISTORY — PX: IR US GUIDE VASC ACCESS RIGHT: IMG2390

## 2021-05-08 LAB — PROTEIN, CSF: Total  Protein, CSF: 45 mg/dL (ref 15–45)

## 2021-05-08 LAB — BASIC METABOLIC PANEL
Anion gap: 5 (ref 5–15)
BUN: 20 mg/dL (ref 6–20)
CO2: 24 mmol/L (ref 22–32)
Calcium: 8.8 mg/dL — ABNORMAL LOW (ref 8.9–10.3)
Chloride: 113 mmol/L — ABNORMAL HIGH (ref 98–111)
Creatinine, Ser: 1 mg/dL (ref 0.44–1.00)
GFR, Estimated: >60 mL/min (ref >60)
Glucose, Bld: 142 mg/dL — ABNORMAL HIGH (ref 70–99)
Potassium: 4 mmol/L (ref 3.5–5.1)
Sodium: 142 mmol/L (ref 135–145)

## 2021-05-08 LAB — HEMOGLOBIN A1C
Hgb A1c MFr Bld: 5.8 % — ABNORMAL HIGH (ref 4.8–5.6)
Mean Plasma Glucose: 119.76 mg/dL

## 2021-05-08 LAB — CSF CELL COUNT WITH DIFFERENTIAL
RBC Count, CSF: 29 /mm3 — ABNORMAL HIGH
Tube #: 3
WBC, CSF: 8 /mm3 — ABNORMAL HIGH (ref 0–5)

## 2021-05-08 LAB — GLUCOSE, CSF: Glucose, CSF: 80 mg/dL — ABNORMAL HIGH (ref 40–70)

## 2021-05-08 MED ORDER — LEVOTHYROXINE SODIUM 100 MCG/5ML IV SOLN
50.0000 ug | Freq: Every day | INTRAVENOUS | Status: DC
  Administered 2021-05-08 – 2021-05-17 (×10): 50 ug via INTRAVENOUS
  Filled 2021-05-08 (×10): qty 5

## 2021-05-08 MED ORDER — DEXTROSE-NACL 5-0.45 % IV SOLN: INTRAVENOUS | Status: AC

## 2021-05-08 MED ORDER — ACD FORMULA A 0.73-2.45-2.2 GM/100ML VI SOLN
1000.0000 mL | Status: DC
  Filled 2021-05-08: qty 1000

## 2021-05-08 MED ORDER — CALCIUM GLUCONATE-NACL 2-0.675 GM/100ML-% IV SOLN
2.0000 g | Freq: Once | INTRAVENOUS | Status: AC
  Filled 2021-05-08: qty 100

## 2021-05-08 MED ORDER — LIDOCAINE HCL (PF) 1 % IJ SOLN
5.0000 mL | Freq: Once | INTRAMUSCULAR | Status: DC
  Filled 2021-05-08: qty 5

## 2021-05-08 MED ORDER — HEPARIN SODIUM (PORCINE) 1000 UNIT/ML IJ SOLN
INTRAMUSCULAR | Status: AC
  Administered 2021-05-08: 1000 [IU]
  Filled 2021-05-08: qty 1

## 2021-05-08 MED ORDER — SODIUM CHLORIDE 0.9 % IV SOLN
INTRAVENOUS | Status: AC
  Filled 2021-05-08 (×3): qty 200

## 2021-05-08 MED ORDER — ACETAMINOPHEN 325 MG PO TABS
ORAL_TABLET | ORAL | Status: AC
  Filled 2021-05-08: qty 2

## 2021-05-08 MED ORDER — LIDOCAINE HCL (PF) 1 % IJ SOLN
INTRAMUSCULAR | Status: AC
  Filled 2021-05-08: qty 30

## 2021-05-08 MED ORDER — HEPARIN SODIUM (PORCINE) 1000 UNIT/ML IJ SOLN: 1000.0000 [IU] | Freq: Once | INTRAMUSCULAR | Status: AC

## 2021-05-08 MED ORDER — CLARITHROMYCIN 500 MG PO TABS
500.0000 mg | ORAL_TABLET | Freq: Two times a day (BID) | ORAL | Status: DC
  Filled 2021-05-08: qty 1

## 2021-05-08 MED ORDER — LIDOCAINE HCL 1 % IJ SOLN
INTRAMUSCULAR | Status: DC | PRN
  Administered 2021-05-08: 10 mL

## 2021-05-08 MED ORDER — CALCIUM GLUCONATE-NACL 2-0.675 GM/100ML-% IV SOLN: 2.0000 g | Freq: Once | INTRAVENOUS | Status: AC

## 2021-05-08 MED ORDER — SODIUM CHLORIDE 0.9 % IV SOLN: Freq: Once | INTRAVENOUS | Status: AC

## 2021-05-08 MED ORDER — CALCIUM CARBONATE ANTACID 500 MG PO CHEW
CHEWABLE_TABLET | ORAL | Status: AC
  Filled 2021-05-08: qty 4

## 2021-05-08 MED ORDER — CALCIUM GLUCONATE-NACL 2-0.675 GM/100ML-% IV SOLN
INTRAVENOUS | Status: AC
  Administered 2021-05-08: 2000 mg via INTRAVENOUS
  Filled 2021-05-08: qty 100

## 2021-05-08 MED ORDER — DIPHENHYDRAMINE HCL 25 MG PO CAPS: 25.0000 mg | ORAL_CAPSULE | Freq: Four times a day (QID) | ORAL | Status: DC | PRN

## 2021-05-08 MED ORDER — PANTOPRAZOLE SODIUM 40 MG PO TBEC: 40.0000 mg | DELAYED_RELEASE_TABLET | Freq: Two times a day (BID) | ORAL | Status: DC

## 2021-05-08 MED ORDER — ACETAMINOPHEN 325 MG PO TABS: 650.0000 mg | ORAL_TABLET | ORAL | Status: DC | PRN

## 2021-05-08 MED ORDER — ACD FORMULA A 0.73-2.45-2.2 GM/100ML VI SOLN
Status: AC
  Administered 2021-05-08: 1000 mL
  Filled 2021-05-08: qty 500

## 2021-05-08 MED ORDER — CALCIUM GLUCONATE 10 % IV SOLN: 2.0000 g | Freq: Once | INTRAVENOUS | Status: AC

## 2021-05-08 MED ORDER — AMOXICILLIN 500 MG PO CAPS: 1000.0000 mg | ORAL_CAPSULE | Freq: Two times a day (BID) | ORAL | Status: DC

## 2021-05-08 MED ORDER — SODIUM CHLORIDE 0.9 % IV BOLUS
1000.0000 mL | Freq: Once | INTRAVENOUS | Status: AC
  Administered 2021-05-13: 1000 mL via INTRAVENOUS

## 2021-05-08 MED ORDER — HEPARIN SODIUM (PORCINE) 1000 UNIT/ML IJ SOLN
INTRAMUSCULAR | Status: AC
  Filled 2021-05-08: qty 4

## 2021-05-08 MED ORDER — DIPHENHYDRAMINE HCL 25 MG PO CAPS
ORAL_CAPSULE | ORAL | Status: AC
  Filled 2021-05-08: qty 1

## 2021-05-08 MED ORDER — HEPARIN SODIUM (PORCINE) 1000 UNIT/ML IJ SOLN
1000.0000 [IU] | Freq: Once | INTRAMUSCULAR | Status: AC
Start: 2021-05-08 — End: 2021-05-08

## 2021-05-08 NOTE — Progress Notes (Addendum)
[facility] PROGRESS NOTE    Progress Note  Diane Todd  JOA:[id number] DOB: Apr 05, 2001 DOA: 05/03/2021 PCP: Pcp, No     Brief Narrative:   Diane Todd is an 20 y.o. female past medical history of essential hypertension hypothyroidism transferred from [facility] for concern after her MRI showed multiple brain abnormalities she has been having generalized weakness poor appetite and blurry vision.  Neurology was consulted and appreciate assistance, recommended serum for AQP4-IgG antibody and MOG-IgG antibody test which have been sent and are pending. She will need 5 days of high-dose steroids and 3 days of high-dose thiamine and then can be switched to orals. Physical therapy evaluated the patient recommended inpatient rehab. Ophthalmology was consulted they did a funduscopic exam that showed papillary retinal edema appears concerning for elevated ICP they recommended no further intervention reconsult as needed, given minimal fundus finding he did recommend to follow-up neuro-ophthalmology at tertiary center.   Significant studies: 05/03/2021 MRI of the brain and spine show high intensity T2 weighted signal contrast enhancement of the medial thalami and periaqueductal. 05/03/2021 renal ultrasound showed no hydronephrosis. 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 Neurology also recommended plasmapheresis she is going for her Plex catheter placement on 05/08/2021 Pregnancy test negative. Patient is not able to take orals currently NPO. Physical therapy evaluated the patient recommended inpatient rehab. Placed on IV fluids half-normal saline with D5, see below nutrition for further details.  Essential  hypertension: Blood pressures well controlled, on metoprolol.  Acute Kidney injury on chronic kidney disease stage III: On IV fluids due to decreased oral intake. Basic metabolic panels pending.  Hypothyroidism TSH of 0.3.  History of H. pylori: Treatment on hold as patient is not able to take orals.  Nutrition: Check albumin, if not tolerating her diet by Friday will place NG tube and start tube feeding.  DVT prophylaxis: lovenox off Lovenox for LP. Family Communication: Sister at bedside Status is: Inpatient  Remains inpatient appropriate because:Hemodynamically unstable  Dispo: The patient is from: Home              Anticipated d/c is to: Home              Patient currently is not medically stable to d/c.   Difficult to place patient No        Code Status:     Code Status Orders  (From admission, onward)           Start     Ordered   05/04/21 0802  Full code  Continuous        05/04/21 0801           Code Status History     Date Active Date Inactive Code Status Order ID Comments User Context   05/04/2021 0309 05/04/2021 0801 Full Code [id number]  Rica Mote, MD Inpatient         IV Access:   Peripheral IV   Procedures and diagnostic studies:   No results found.   Medical Consultants:   None.   Subjective:    Diane Todd able to follow commands continues to be sleeping and drooling.  Objective:    Vitals:   05/07/21 1951 05/07/21 1955 05/07/21 2320 05/08/21 0406  BP: 117/82 117/82  121/70 114/68  Pulse: (!) 54 (!) 52 60 62  Resp: 16 16 20 18   Temp: 98.6 F (37 C) 98.6 F (37 C) 98.7 F (37.1 C) 98.8 F (37.1 C)  TempSrc: Oral Oral Oral Oral  SpO2: 98% 97% 98% 97%  Weight:      Height:       SpO2: 97 %   Intake/Output Summary (Last 24 hours) at 05/08/2021 0853 Last data filed at 05/08/2021 0504 Gross per 24 hour  Intake 603.34 ml  Output 2300 ml  Net -1696.66 ml    Filed Weights   05/05/21 1300   Weight: 98.6 kg    Exam: General exam: In no acute distress. Respiratory system: Good air movement and clear to auscultation. Cardiovascular system: S1 & S2 heard, RRR. No JVD. Gastrointestinal system: Abdomen is nondistended, soft and nontender.  Extremities: No pedal edema. Skin: No rashes, lesions or ulcers  Data Reviewed:    Labs: Basic Metabolic Panel: Recent Labs  Lab 05/04/21 0730 05/05/21 0223 05/07/21 0914 05/08/21 0325  NA 144 144 144 142  K 3.9 4.2 3.7 4.0  CL 113* 114* 116* 113*  CO2 19* 20* 22 24  GLUCOSE 134* 140* 143* 142*  BUN 25* 23* 21* 20  CREATININE 1.56* 1.23* 1.04* 1.00  CALCIUM 9.1 8.9 8.5* 8.8*    GFR Estimated Creatinine Clearance: 108.2 mL/min (by C-G formula based on SCr of 1 mg/dL). Liver Function Tests: Recent Labs  Lab 05/04/21 0730 05/05/21 0223  AST 21 18  ALT 30 27  ALKPHOS 46 44  BILITOT 1.5* 1.3*  PROT 6.7 6.8  ALBUMIN 3.5 3.5    No results for input(s): LIPASE, AMYLASE in the last 168 hours. No results for input(s): AMMONIA in the last 168 hours. Coagulation profile No results for input(s): INR, PROTIME in the last 168 hours. COVID-19 Labs  No results for input(s): DDIMER, FERRITIN, LDH, CRP in the last 72 hours.  No results found for: SARSCOV2NAA  CBC: Recent Labs  Lab 05/04/21 0730 05/05/21 0223  WBC 8.4 7.0  HGB 14.8 15.1*  HCT 45.4 46.7*  MCV 85.8 87.3  PLT 186 148*    Cardiac Enzymes: No results for input(s): CKTOTAL, CKMB, CKMBINDEX, TROPONINI in the last 168 hours. BNP (last 3 results) No results for input(s): PROBNP in the last 8760 hours. CBG: Recent Labs  Lab 05/04/21 1950  GLUCAP 134*    D-Dimer: No results for input(s): DDIMER in the last 72 hours. Hgb A1c: No results for input(s): HGBA1C in the last 72 hours. Lipid Profile: No results for input(s): CHOL, HDL, LDLCALC, TRIG, CHOLHDL, LDLDIRECT in the last 72 hours. Thyroid function studies: No results for input(s): TSH, T4TOTAL,  T3FREE, THYROIDAB in the last 72 hours.  Invalid input(s): FREET3  Anemia work up: No results for input(s): VITAMINB12, FOLATE, FERRITIN, TIBC, IRON, RETICCTPCT in the last 72 hours. Sepsis Labs: Recent Labs  Lab 05/04/21 0730 05/05/21 0223  WBC 8.4 7.0    Microbiology No results found for this or any previous visit (from the past 240 hour(s)).   Medications:    Chlorhexidine Gluconate Cloth  6 each Topical Daily   docusate sodium  100 mg Oral BID   levothyroxine  100 mcg Oral Daily   lidocaine (PF)  5 mL Intradermal Once   metoprolol succinate  25 mg Oral Daily   multivitamin with minerals  1 tablet Oral Daily   pantoprazole (PROTONIX) IV  40 mg Intravenous Q12H   sucralfate  1 g  Oral TID AC & HS   Continuous Infusions:  methylPREDNISolone (SOLU-MEDROL) injection 1,000 mg (05/07/21 1138)   thiamine injection        LOS: 5 days   Marinda Elk  [facility]  05/08/2021, 8:53 AM

## 2021-05-08 NOTE — Plan of Care (Signed)

## 2021-05-08 NOTE — Procedures (Signed)
Diagnostic lumbar puncture was performed utilizing local anesthesia.  A 20g spinal needle was used for access. Opening pressure was 17 mmHg.  Approximately 13 cc of clear CSF was acquired in four separate tubes and sent to the lab for analysis.  There were no immediate complications. The patient tolerated the procedure well.

## 2021-05-08 NOTE — Progress Notes (Signed)
Pt completed Therapeutic Plasma Exchange treatment without complications. Bp 110/70 HR 70. O2 sat 98 % in room air.

## 2021-05-08 NOTE — H&P (Signed)
Patient Status: [facility] - In-pt  Assessment and Plan: Patient in need of venous access for plasmapheresis.  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  Plans for plasmapheresis per Neurology.  Request for temp catheter.  Patient s/p LP this AM. Brought to IR for discussion and consent.  Sister at bedside.  Patient is alert, but does not open eyes.  Asks appropriate questions, demonstrates ability to understand goals of the procedure.   Risks and benefits discussed with the patient including, but not limited to bleeding, infection, vascular injury, pneumothorax which may require chest tube placement, air embolism or even death  All of the patient's questions were answered, patient is agreeable to proceed. Consent signed and in chart-- verbal consent from patient, co-signature by sister.   ______________________________________________________________________   History of Present Illness: Diane Todd is a 20 y.o. female with past medical history of H pylori, intractable N/V, poor PO intake admitted with blurred vision and ataxia. Followed by Neuro who is requesting temporary plasmaphersis catheter for 5, inpatient treatments of plasmapheresis. Patient with somnolence which is slowly improving.   Allergies and medications reviewed.   Review of Systems: A 12 point ROS discussed and pertinent positives are indicated in the HPI above.  All other systems are negative.  Review of Systems  Constitutional:  Positive for activity change and fatigue. Negative for fever.  Respiratory:  Negative for cough and shortness of breath.   Cardiovascular:  Negative for chest pain.  Gastrointestinal:  Negative for abdominal pain.  Musculoskeletal:  Negative for back pain.  Psychiatric/Behavioral:  Negative for behavioral problems and confusion.    Vital Signs: BP 114/68 (BP Location: Left Arm)   Pulse 62   Temp 98.8 F (37.1  C) (Oral)   Resp 18   Ht 5\' 7"  (1.702 m)   Wt 217 lb 6 oz (98.6 kg)   SpO2 97%   BMI 34.05 kg/m   Physical Exam Vitals and nursing note reviewed.  Constitutional:      General: She is not in acute distress.    Appearance: Normal appearance. She is not ill-appearing.  HENT:     Mouth/Throat:     Mouth: Mucous membranes are moist.     Pharynx: Oropharynx is clear.  Cardiovascular:     Rate and Rhythm: Normal rate and regular rhythm.  Pulmonary:     Effort: Pulmonary effort is normal. No respiratory distress.     Breath sounds: Normal breath sounds.  Abdominal:     General: Abdomen is flat.     Palpations: Abdomen is soft.  Musculoskeletal:     Cervical back: Normal range of motion and neck supple.  Skin:    General: Skin is warm and dry.  Neurological:     General: No focal deficit present.     Mental Status: She is alert and oriented to person, place, and time. Mental status is at baseline.  Psychiatric:        Mood and Affect: Mood normal.        Behavior: Behavior normal.        Thought Content: Thought content normal.        Judgment: Judgment normal.     Imaging reviewed.   Labs:  COAGS: No results for input(s): INR, APTT in the last 8760 hours.  BMP: Recent Labs    05/04/21 0730 05/05/21 0223 05/07/21 0914 05/08/21 0325  NA 144 144 144 142  K 3.9 4.2  3.7 4.0  CL 113* 114* 116* 113*  CO2 19* 20* 22 24  GLUCOSE 134* 140* 143* 142*  BUN 25* 23* 21* 20  CALCIUM 9.1 8.9 8.5* 8.8*  CREATININE 1.56* 1.23* 1.04* 1.00  GFRNONAA 49* >60 >60 >60       Electronically Signed: Hoyt Koch, PA 05/08/2021, 9:41 AM   I spent a total of 15 minutes in face to face in clinical consultation, greater than 50% of which was counseling/coordinating care for venous access.

## 2021-05-08 NOTE — Progress Notes (Signed)
OT Cancellation Note  Patient Details Name: Diane Todd MRN: [id number] DOB: 2001/01/27   Cancelled Treatment:    Reason Eval/Treat Not Completed: Patient at procedure or test/ unavailable.  Pt gone for PLEX cath and was previously on bedrest due to LP puncture.  Eber Jones., OTR/L Acute Rehabilitation Services Pager [phone] Office [phone]   Jeani Hawking M 05/08/2021, 1:37 PM

## 2021-05-08 NOTE — Progress Notes (Signed)
Brief Neuro Update:  Briefly saw the patient at bedside. She continues to be somnolent with poor po intake and nausea. Moving all extremities.   I spoke to patient's sister at bedside and answered questions regarding NMOSD and PLEX.   Patient's sister also brought up that she was recently diagnosed with H.Pylori. There is weak association between H.Pylori and NMOSD. H.pylori can be a potential risk factor via molecular mimicry. It does seem like treatment with Amoxicillin, Clarithromycin and PPI was attempted but not completed as patient was unable to tolerate PO intake.   - Not sure if treatment for H.pylori will be helpful but worth treating it and evaluating for improvement. Discussed with Dr. David Stall and will seek his assistance with treatment. - She is scheduled for LP under fluoro, HD cath placement followed by PLEX in the afternoon. - Scheduled for 4th dose of IV Solumderol today.  Erick Blinks [facility] Pager Number [phone]

## 2021-05-08 NOTE — Progress Notes (Signed)
PT Cancellation Note  Patient Details Name: Diane Todd MRN: [id number] DOB: March 12, 2001   Cancelled Treatment:    Reason Eval/Treat Not Completed: Active bedrest order - pt on bedrest s/p LP from [id number]. PT to check back as schedule allows.  Marye Round, PT DPT Acute Rehabilitation Services Pager [phone]  Office [phone]    Truddie Coco 05/08/2021, 11:48 AM

## 2021-05-08 NOTE — Procedures (Signed)
Interventional Radiology Procedure:   Indications: NMOSD  Procedure: Placement of pheresis catheter  Findings: Right IJ catheter, tip at SVC/RA junction  Complications: None     EBL: less than 5 ml  Plan: Catheter is ready to use.    Wilber Fini R. Lowella Dandy, MD  Pager: [phone]

## 2021-05-09 DIAGNOSIS — E43 Unspecified severe protein-calorie malnutrition: Secondary | ICD-10-CM

## 2021-05-09 LAB — MISC LABCORP TEST (SEND OUT): Labcorp test code: 5015310

## 2021-05-09 LAB — ANTI-MYELIN ASSOC GLYCOP IGG: Anti-Myelin Assoc Glycop IgG: <1:10 {titer}

## 2021-05-09 LAB — CYTOLOGY - NON PAP

## 2021-05-09 LAB — VDRL, CSF: VDRL Quant, CSF: NONREACTIVE

## 2021-05-09 NOTE — Progress Notes (Signed)
PROGRESS NOTE    Diane Todd  QQP:[id number] DOB: Sep 22, 2001 DOA: 05/03/2021 PCP: Pcp, No   Brief Narrative:   Diane Todd is an 20 y.o. female past medical history of essential hypertension hypothyroidism transferred from [facility] for concern after her MRI showed multiple brain abnormalities she has been having generalized weakness poor appetite and blurry vision.   Neurology was consulted and appreciate assistance, recommended serum for AQP4-IgG antibody and MOG-IgG antibody test which have been sent and are pending. She will need 5 days of high-dose steroids and 3 days of high-dose thiamine and then can be switched to orals. Physical therapy evaluated the patient recommended inpatient rehab. Ophthalmology was consulted they did a funduscopic exam that showed papillary retinal edema appears concerning for elevated ICP they recommended no further intervention reconsult as needed, given minimal fundus finding he did recommend to follow-up neuro-ophthalmology at tertiary center.   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 Patient is not able to take orals currently NPO. Physical therapy evaluated the patient recommended inpatient rehab. Placed on IV fluids half-normal saline with D5, see below nutrition for further details.   Essential hypertension: Blood pressures well controlled, on metoprolol.   Acute Kidney injury on chronic kidney disease stage III: On IV fluids due to decreased oral intake. Basic metabolic panels pending.    Hypothyroidism TSH of 0.3.   History of H. pylori: Treatment on hold as patient is not able to take orals.   Nutrition: Check albumin, if not tolerating her diet by Friday will place NG tube and start tube feeding.   DVT prophylaxis: SCD/Compression stockings  Code Status: FULL    Code Status Orders  (From admission, onward)           Start     Ordered   05/04/21 0802  Full code  Continuous        05/04/21 0801           Code Status History     Date Active Date Inactive Code Status Order ID Comments User Context   05/04/2021 0309 05/04/2021 0801 Full Code [id number]  Rica Mote, MD Inpatient      Family Communication: sister at bedside  Disposition Plan:    Status is: Inpatient   Remains inpatient appropriate because:Hemodynamically unstable   Dispo: The patient is from: Home              Anticipated d/c is to: Home              Patient currently is not medically stable to d/c.              Difficult to place patient No   Consults called:  neuro Admission status: Inpatient   Consultants:  As above  Procedures:  MR BRAIN W WO CONTRAST  Result Date: 05/04/2021 CLINICAL DATA:  Weakness and blurry vision. EXAM: MRI HEAD WITH AND WITHOUT CONTRAST MRI CERVICAL AND THORACIC SPINE WITHOUT AND WITH CONTRAST TECHNIQUE: Multiplanar and multiecho pulse sequences of the head and cervical spine, to include the craniocervical junction and cervicothoracic junction, and the thoracic spine, were obtained  without and with intravenous contrast. CONTRAST:  10mL GADAVIST GADOBUTROL 1 MMOL/ML IV SOLN COMPARISON:  None. FINDINGS: MRI BRAIN FINDINGS Brain: No acute infarct, mass effect or extra-axial collection. No acute or chronic hemorrhage. There is hyperintense T2-weighted signal within the medial thalami and periaqueductal gray matter. There is mild contrast enhancement of the periaqueductal gray matter but no other enhancing lesion. The midline structures are normal.  Vascular: Major flow voids are preserved. Skull and upper cervical spine: Normal calvarium and skull base. Visualized upper cervical spine and soft tissues are normal. Sinuses/Orbits:No paranasal sinus fluid levels or advanced mucosal thickening. No mastoid or middle ear effusion. Normal orbits. MRI CERVICAL SPINE FINDINGS Alignment: Physiologic. Vertebrae: No fracture, evidence of discitis, or bone lesion. Cord: Normal signal and morphology. No abnormal contrast enhancement. Posterior Fossa, vertebral arteries, paraspinal tissues: Negative. Disc levels: No spinal canal or neural foraminal stenosis. MRI THORACIC SPINE FINDINGS Alignment:  Physiologic. Vertebrae: No fracture, evidence of discitis, or bone lesion. Cord: Normal signal and morphology. No abnormal contrast enhancement Paraspinal and other soft tissues: Negative. Disc levels: T7-8: Small right subarticular disc protrusion without stenosis. T8-9: Small left subarticular disc protrusion without stenosis. The other thoracic disc levels are normal. IMPRESSION: 1. Hyperintense T2-weighted signal and mild contrast enhancement within the medial thalami and periaqueductal gray matter. Demyelinating disease remains a primary consideration. MRI of the orbits with and without contrast might be helpful for better characterization of the optic nerves. CSF sampling should also be considered. 2. No demyelinating lesions of the cervical or thoracic spinal cord. Electronically Signed   By: Deatra Robinson M.D.   On: 05/04/2021 02:42   MR CERVICAL SPINE W WO CONTRAST  Result Date: 05/04/2021 CLINICAL DATA:  Weakness and blurry vision. EXAM: MRI HEAD WITH AND WITHOUT CONTRAST MRI CERVICAL AND THORACIC SPINE WITHOUT AND WITH CONTRAST TECHNIQUE: Multiplanar and multiecho pulse sequences of the head and cervical spine, to include the craniocervical junction and cervicothoracic junction, and the thoracic spine, were obtained without and with intravenous contrast. CONTRAST:   10mL GADAVIST GADOBUTROL 1 MMOL/ML IV SOLN COMPARISON:  None. FINDINGS: MRI BRAIN FINDINGS Brain: No acute infarct, mass effect or extra-axial collection. No acute or chronic hemorrhage. There is hyperintense T2-weighted signal within the medial thalami and periaqueductal gray matter. There is mild contrast enhancement of the periaqueductal gray matter but no other enhancing lesion. The midline structures are normal. Vascular: Major flow voids are preserved. Skull and upper cervical spine: Normal calvarium and skull base. Visualized upper cervical spine and soft tissues are normal. Sinuses/Orbits:No paranasal sinus fluid levels or advanced mucosal thickening. No mastoid or middle ear effusion. Normal orbits. MRI CERVICAL SPINE FINDINGS Alignment: Physiologic. Vertebrae: No fracture, evidence of discitis, or bone lesion. Cord: Normal signal and morphology. No abnormal contrast enhancement. Posterior Fossa, vertebral arteries, paraspinal tissues: Negative. Disc levels: No spinal canal or neural foraminal stenosis. MRI THORACIC SPINE FINDINGS Alignment:  Physiologic. Vertebrae: No fracture, evidence of discitis, or bone lesion. Cord: Normal signal and morphology. No abnormal contrast enhancement Paraspinal and other soft tissues: Negative. Disc levels: T7-8: Small right subarticular disc protrusion without stenosis. T8-9: Small left subarticular disc protrusion without stenosis. The other thoracic disc levels are normal. IMPRESSION: 1. Hyperintense T2-weighted signal and mild contrast enhancement within the medial thalami and periaqueductal gray matter. Demyelinating disease remains a primary consideration. MRI of the orbits with and without contrast might be helpful for better characterization of the optic nerves. CSF sampling should also be considered. 2. No demyelinating lesions of the  cervical or thoracic spinal cord. Electronically Signed   By: Deatra Robinson M.D.   On: 05/04/2021 02:42   MR THORACIC SPINE W WO  CONTRAST  Result Date: 05/04/2021 CLINICAL DATA:  Weakness and blurry vision. EXAM: MRI HEAD WITH AND WITHOUT CONTRAST MRI CERVICAL AND THORACIC SPINE WITHOUT AND WITH CONTRAST TECHNIQUE: Multiplanar and multiecho pulse sequences of the head and cervical spine, to include the craniocervical junction and cervicothoracic junction, and the thoracic spine, were obtained without and with intravenous contrast. CONTRAST:  10mL GADAVIST GADOBUTROL 1 MMOL/ML IV SOLN COMPARISON:  None. FINDINGS: MRI BRAIN FINDINGS Brain: No acute infarct, mass effect or extra-axial collection. No acute or chronic hemorrhage. There is hyperintense T2-weighted signal within the medial thalami and periaqueductal gray matter. There is mild contrast enhancement of the periaqueductal gray matter but no other enhancing lesion. The midline structures are normal. Vascular: Major flow voids are preserved. Skull and upper cervical spine: Normal calvarium and skull base. Visualized upper cervical spine and soft tissues are normal. Sinuses/Orbits:No paranasal sinus fluid levels or advanced mucosal thickening. No mastoid or middle ear effusion. Normal orbits. MRI CERVICAL SPINE FINDINGS Alignment: Physiologic. Vertebrae: No fracture, evidence of discitis, or bone lesion. Cord: Normal signal and morphology. No abnormal contrast enhancement. Posterior Fossa, vertebral arteries, paraspinal tissues: Negative. Disc levels: No spinal canal or neural foraminal stenosis. MRI THORACIC SPINE FINDINGS Alignment:  Physiologic. Vertebrae: No fracture, evidence of discitis, or bone lesion. Cord: Normal signal and morphology. No abnormal contrast enhancement Paraspinal and other soft tissues: Negative. Disc levels: T7-8: Small right subarticular disc protrusion without stenosis. T8-9: Small left subarticular disc protrusion without stenosis. The other thoracic disc levels are normal. IMPRESSION: 1. Hyperintense T2-weighted signal and mild contrast enhancement within  the medial thalami and periaqueductal gray matter. Demyelinating disease remains a primary consideration. MRI of the orbits with and without contrast might be helpful for better characterization of the optic nerves. CSF sampling should also be considered. 2. No demyelinating lesions of the cervical or thoracic spinal cord. Electronically Signed   By: Deatra Robinson M.D.   On: 05/04/2021 02:42   US RENAL  Result Date: 05/04/2021 CLINICAL DATA:  Chronic renal disease. EXAM: RENAL / URINARY TRACT ULTRASOUND COMPLETE COMPARISON:  CT abdomen and pelvis March 14, 2021. FINDINGS: Right Kidney: Renal measurements: 10.2 x 4.2 x 5.0 cm = volume: 112.0 mL. Mild renal cortical thinning. No hydronephrosis. Left Kidney: Renal measurements: May 0.4 x 4.5 x 3.9 cm = volume: 76.7 mL. Mild renal cortical thinning. No hydronephrosis. Bladder: Decompressed with Foley catheter. Other: None. IMPRESSION: No hydronephrosis. Electronically Signed   By: Annia Belt M.D.   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 Catheter needed for plasmapheresis. EXAM: FLUOROSCOPIC AND ULTRASOUND GUIDED PLACEMENT OF A NON-TUNNELED DIALYSIS CATHETER Physician: Rachelle Hora. Henn, MD MEDICATIONS: 1% lidocaine ANESTHESIA/SEDATION: None FLUOROSCOPY TIME:  Fluoroscopy Time: 12 seconds, 7 mGy COMPLICATIONS: None immediate. PROCEDURE: Informed consent was obtained for catheter placement. The patient was placed supine on the interventional table. Ultrasound confirmed a patent right internal jugular vein. Ultrasound images were obtained for documentation. The right neck was prepped and draped in a sterile fashion. The right neck was anesthetized with 1% lidocaine. Maximal barrier sterile technique was utilized including caps, mask, sterile gowns, sterile gloves, sterile drape, hand hygiene and skin antiseptic. A small incision was made with #11 blade scalpel. A 21 gauge  needle directed into the right internal  jugular vein with ultrasound guidance. A micropuncture dilator set was placed. A 15 cm Trialysis was selected. The catheter was advanced over a wire and positioned at the superior cavoatrial junction. Fluoroscopic images were obtained for documentation. Both dialysis lumens were found to aspirate and flush well. The proper amount of heparin was flushed in both lumens. The central venous lumen was flushed with normal saline. Catheter was sutured to skin. FINDINGS: Catheter tip at the superior cavoatrial junction. IMPRESSION: Successful placement of a right jugular non-tunneled dialysis/pheresis catheter using ultrasound and fluoroscopic guidance. Electronically Signed   By: Richarda Overlie M.D.   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 Catheter needed for plasmapheresis.   EXAM: FLUOROSCOPIC AND ULTRASOUND GUIDED PLACEMENT OF A NON-TUNNELED DIALYSIS CATHETER   Physician: Rachelle Hora. Henn, MD   MEDICATIONS: 1% lidocaine   ANESTHESIA/SEDATION: None   FLUOROSCOPY TIME:  Fluoroscopy Time: 12 seconds, 7 mGy   COMPLICATIONS: None immediate.   PROCEDURE: Informed consent was obtained for catheter placement. The patient was placed supine on the interventional table. Ultrasound confirmed a patent right internal jugular vein. Ultrasound images were obtained for documentation. The right neck was prepped and draped in a sterile fashion. The right neck was anesthetized with 1% lidocaine. Maximal barrier sterile technique was utilized including caps, mask, sterile gowns, sterile gloves, sterile drape, hand hygiene and skin antiseptic. A small incision was made with #11 blade scalpel. A 21 gauge needle directed into the right internal jugular vein with ultrasound guidance. A micropuncture dilator set was placed. A 15 cm Trialysis was selected. The catheter was advanced over a wire and positioned at the superior cavoatrial  junction. Fluoroscopic images were obtained for documentation. Both dialysis lumens were found to aspirate and flush well. The proper amount of heparin was flushed in both lumens. The central venous lumen was flushed with normal saline. Catheter was sutured to skin.   FINDINGS: Catheter tip at the superior cavoatrial junction.   IMPRESSION: Successful placement of a right jugular non-tunneled dialysis/pheresis catheter using ultrasound and fluoroscopic guidance.     Electronically Signed   By: Richarda Overlie M.D.   On: 05/08/2021 16:58    DG FL GUIDED LUMBAR PUNCTURE  Result Date: 05/08/2021 CLINICAL DATA:  Optic neuritis, altered mental status EXAM: DIAGNOSTIC LUMBAR PUNCTURE UNDER FLUOROSCOPIC GUIDANCE COMPARISON:  None FLUOROSCOPY TIME:  Fluoroscopy Time:  12 seconds Radiation Exposure Index (if provided by the fluoroscopic device): 1.5 mGy Number of Acquired Spot Images: 0 PROCEDURE: Informed consent was obtained from the patient prior to the procedure, including potential complications of headache, allergy, and pain. With the patient prone, the lower back was prepped with Betadine. 1% Lidocaine was used for local anesthesia. Lumbar puncture was performed at the L4-L5 level using a 20 gauge needle with return of clear CSF with an opening pressure of 17 cm water. Approximately 13 ml of CSF were obtained for laboratory studies. The patient tolerated the procedure well and there were no apparent complications. IMPRESSION: Successful lumbar puncture. Approximately 13 mL of clear CSF were obtained and sent to the laboratory for analysis. There were no immediate complications. Electronically Signed   By: Caprice Renshaw   On: 05/08/2021 09:48   MR ORBITS W WO CONTRAST  Result Date: 05/05/2021 CLINICAL DATA:  Optic neuritis suspected. EXAM: MRI OF THE ORBITS WITHOUT AND WITH CONTRAST TECHNIQUE: Multiplanar, multi-echo pulse sequences of the orbits and surrounding structures were acquired including fat saturation  techniques, before and after intravenous contrast administration. CONTRAST:  66mL GADAVIST GADOBUTROL 1 MMOL/ML IV SOLN COMPARISON:  Abnormal MRI of the brain. FINDINGS: Orbits: Globes are within normal limits bilaterally. The optic nerves are unremarkable. Chiasm is within normal limits. Abnormal signal or enhancement is present. Extraocular muscles are within normal limits. Visualized sinuses: Polyp or mucous retention cyst is noted in the left maxillary sinus. The paranasal sinuses and mastoid air cells are otherwise clear. Soft tissues: Periorbital soft tissues and visualized facial soft tissues are within normal limits. 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 There is subtle enhancement along the periaqueductal gray matter and medial thalami. IMPRESSION: 1. Normal MRI appearance of the orbits. 2. Subtle enhancement along the periaqueductal gray matter and medial thalami bilaterally. This corresponds to the abnormal signal changes on the MRI from yesterday. In addition to a demyelinating process, or Wernicke encephalopathy (thiamine deficiency encephalopathy) is also considered. Electronically Signed   By: Marin Roberts M.D.   On: 05/05/2021 13:25      Subjective: Ill appearing, awakens sister at bedside  Objective: Vitals:   05/09/21 0519 05/09/21 0812 05/09/21 0820 05/09/21 1153  BP:  108/76  102/72  Pulse:  (!) 49 60 (!) 45  Resp:  18  18  Temp:  98 F (36.7 C)  98.1 F (36.7 C)  TempSrc:  Oral  Oral  SpO2:  98%  97%  Weight: 99.6 kg     Height:        Intake/Output Summary (Last 24 hours) at 05/09/2021 1415 Last data filed at 05/09/2021 0455 Gross per 24 hour  Intake 835.58 ml  Output 1150 ml  Net -314.42 ml   Filed Weights   05/05/21 1300 05/08/21 1700 05/09/21 0519  Weight: 98.6 kg 98.6 kg 99.6 kg    Examination:  General exam: resting comfortably Respiratory system: Clear to auscultation. Respiratory effort  normal. Cardiovascular system: S1 & S2 heard, RRR. No JVD, murmurs, rubs, gallops or clicks. No pedal edema. Gastrointestinal system: Abdomen is nondistended, soft and nontender. No organomegaly or masses felt. Quiet bowel sounds heard. Central nervous system: awakens and follows commands, no contracture. Extremities: wwp, no edema Skin: No rashes, lesions or ulcers Psychiatry: Judgement and insight are impaired,  Mood & affect flat    Data Reviewed: I have personally reviewed following labs and imaging studies  CBC: Recent Labs  Lab 05/04/21 0730 05/05/21 0223  WBC 8.4 7.0  HGB 14.8 15.1*  HCT 45.4 46.7*  MCV 85.8 87.3  PLT 186 148*   Basic Metabolic Panel: Recent Labs  Lab 05/04/21 0730 05/05/21 0223 05/07/21 0914 05/08/21 0325  NA 144 144 144 142  K 3.9 4.2 3.7 4.0  CL 113* 114* 116* 113*  CO2 19* 20* 22 24  GLUCOSE 134* 140* 143* 142*  BUN 25* 23* 21* 20  CREATININE 1.56* 1.23* 1.04* 1.00  CALCIUM 9.1 8.9 8.5* 8.8*   GFR: Estimated Creatinine Clearance: 108.8 mL/min (by C-G formula based on SCr of 1 mg/dL). Liver Function Tests: Recent Labs  Lab 05/04/21 0730 05/05/21 0223  AST 21 18  ALT 30 27  ALKPHOS 46 44  BILITOT 1.5* 1.3*  PROT 6.7 6.8  ALBUMIN 3.5 3.5   No results for input(s): LIPASE, AMYLASE in the last 168 hours. No results for input(s): AMMONIA in the last 168 hours. Coagulation Profile: No results for input(s): INR, PROTIME in the last 168 hours. Cardiac Enzymes: No results for input(s): CKTOTAL, CKMB, CKMBINDEX, TROPONINI in  the last 168 hours. BNP (last 3 results) No results for input(s): PROBNP in the last 8760 hours. HbA1C: No results for input(s): HGBA1C in the last 72 hours. CBG: Recent Labs  Lab 05/04/21 1950  GLUCAP 134*   Lipid Profile: No results for input(s): CHOL, HDL, LDLCALC, TRIG, CHOLHDL, LDLDIRECT in the last 72 hours. Thyroid Function Tests: No results for input(s): TSH, T4TOTAL, FREET4, T3FREE, THYROIDAB in the  last 72 hours. Anemia Panel: No results for input(s): VITAMINB12, FOLATE, FERRITIN, TIBC, IRON, RETICCTPCT in the last 72 hours. Sepsis Labs: No results for input(s): PROCALCITON, LATICACIDVEN in the last 168 hours.  Recent Results (from the past 240 hour(s))  Culture, Urine     Status: Abnormal (Preliminary result)   Collection Time: 05/07/21 11:31 PM   Specimen: Urine, Random  Result Value Ref Range Status   Specimen Description URINE, RANDOM  Final   Special Requests   Final    NONE Performed at [facility], [address], [place], [place] [address]    Culture >=100,000 COLONIES/mL ESCHERICHIA COLI (A)  Final   Report Status PENDING  Incomplete  CSF culture w Gram Stain     Status: None (Preliminary result)   Collection Time: 05/08/21  9:21 AM   Specimen: PATH Cytology CSF; Cerebrospinal Fluid  Result Value Ref Range Status   Specimen Description CSF  Final   Special Requests NONE  Final   Gram Stain CYTOSPIN SMEAR NO WBC SEEN NO ORGANISMS SEEN   Final   Culture   Final    NO GROWTH < 24 HOURS Performed at [facility], [address] [place], [place] [address]    Report Status PENDING  Incomplete  Anaerobic culture w Gram Stain     Status: None (Preliminary result)   Collection Time: 05/08/21  9:21 AM   Specimen: PATH Cytology CSF; Cerebrospinal Fluid  Result Value Ref Range Status   Specimen Description CSF  Final   Special Requests NONE  Final   Gram Stain   Final    WBC PRESENT, PREDOMINANTLY MONONUCLEAR NO ORGANISMS SEEN CYTOSPIN SMEAR Performed at [facility], 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 Catheter needed for plasmapheresis. EXAM: FLUOROSCOPIC AND ULTRASOUND GUIDED PLACEMENT OF A NON-TUNNELED DIALYSIS CATHETER  Physician: Rachelle Hora. Henn, MD MEDICATIONS: 1% lidocaine ANESTHESIA/SEDATION: None FLUOROSCOPY TIME:  Fluoroscopy Time: 12 seconds, 7 mGy COMPLICATIONS: None immediate. PROCEDURE: Informed consent was obtained for catheter placement. The patient was placed supine on the interventional table. Ultrasound confirmed a patent right internal jugular vein. Ultrasound images were obtained for documentation. The right neck was prepped and draped in a sterile fashion. The right neck was anesthetized with 1% lidocaine. Maximal barrier sterile technique was utilized including caps, mask, sterile gowns, sterile gloves, sterile drape, hand hygiene and skin antiseptic. A small incision was made with #11 blade scalpel. A 21 gauge needle directed into the right internal jugular vein with ultrasound guidance. A micropuncture dilator set was placed. A 15 cm Trialysis was selected. The catheter was advanced over a wire and positioned at the superior cavoatrial junction. Fluoroscopic images were obtained for documentation. Both dialysis lumens were found to aspirate and flush well. The proper amount of heparin was flushed in both lumens. The central  venous lumen was flushed with normal saline. Catheter was sutured to skin. FINDINGS: Catheter tip at the superior cavoatrial junction. IMPRESSION: Successful placement of a right jugular non-tunneled dialysis/pheresis catheter using ultrasound and fluoroscopic guidance. Electronically Signed   By: Richarda OverlieAdam  Henn M.D.   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 Catheter needed for plasmapheresis.   EXAM: FLUOROSCOPIC AND ULTRASOUND GUIDED PLACEMENT OF A NON-TUNNELED DIALYSIS CATHETER   Physician: Rachelle HoraAdam R. Henn, MD   MEDICATIONS: 1% lidocaine   ANESTHESIA/SEDATION: None   FLUOROSCOPY TIME:  Fluoroscopy Time: 12 seconds, 7 mGy   COMPLICATIONS: None immediate.   PROCEDURE: Informed consent was obtained for catheter placement.  The patient was placed supine on the interventional table. Ultrasound confirmed a patent right internal jugular vein. Ultrasound images were obtained for documentation. The right neck was prepped and draped in a sterile fashion. The right neck was anesthetized with 1% lidocaine. Maximal barrier sterile technique was utilized including caps, mask, sterile gowns, sterile gloves, sterile drape, hand hygiene and skin antiseptic. A small incision was made with #11 blade scalpel. A 21 gauge needle directed into the right internal jugular vein with ultrasound guidance. A micropuncture dilator set was placed. A 15 cm Trialysis was selected. The catheter was advanced over a wire and positioned at the superior cavoatrial junction. Fluoroscopic images were obtained for documentation. Both dialysis lumens were found to aspirate and flush well. The proper amount of heparin was flushed in both lumens. The central venous lumen was flushed with normal saline. Catheter was sutured to skin.   FINDINGS: Catheter tip at the superior cavoatrial junction.   IMPRESSION: Successful placement of a right jugular non-tunneled dialysis/pheresis catheter using ultrasound and fluoroscopic guidance.     Electronically Signed   By: Richarda OverlieAdam  Henn M.D.   On: 05/08/2021 16:58    DG FL GUIDED LUMBAR PUNCTURE  Result Date: 05/08/2021 CLINICAL DATA:  Optic neuritis, altered mental status EXAM: DIAGNOSTIC LUMBAR PUNCTURE UNDER FLUOROSCOPIC GUIDANCE COMPARISON:  None FLUOROSCOPY TIME:  Fluoroscopy Time:  12 seconds Radiation Exposure Index (if provided by the fluoroscopic device): 1.5 mGy Number of Acquired Spot Images: 0 PROCEDURE: Informed consent was obtained from the patient prior to the procedure, including potential complications of headache, allergy, and pain. With the patient prone, the lower back was prepped with Betadine. 1% Lidocaine was used for local anesthesia. Lumbar puncture was performed at the L4-L5 level using a 20 gauge needle with  return of clear CSF with an opening pressure of 17 cm water. Approximately 13 ml of CSF were obtained for laboratory studies. The patient tolerated the procedure well and there were no apparent complications. IMPRESSION: Successful lumbar puncture. Approximately 13 mL of clear CSF were obtained and sent to the laboratory for analysis. There were no immediate complications. Electronically Signed   By: Caprice RenshawJacob  Kahn   On: 05/08/2021 09:48        Scheduled Meds:  Chlorhexidine Gluconate Cloth  6 each Topical Daily   docusate sodium  100 mg Oral BID   levothyroxine  50 mcg Intravenous Daily   lidocaine (PF)  5 mL Intradermal Once   metoprolol succinate  25 mg Oral Daily   Continuous Infusions:  citrate dextrose     dextrose 5 % and 0.45% NaCl 75 mL/hr at 05/09/21 0455   sodium chloride     thiamine injection 250 mg (05/09/21 0920)     LOS: 6 days    Time spent:  35    Burke Keels, MD [facility]  If 7PM-7AM, please contact night-coverage  05/09/2021, 2:15 PM

## 2021-05-09 NOTE — Progress Notes (Signed)
Occupational Therapy Treatment Patient Details Name: Diane Todd MRN: [id number] DOB: 05-25-2001 Today's Date: 05/09/2021    History of present illness Pt is 20 yo female who presents from [facility] on 05/03/21 with worsening blurry vision and difficulty walking. She has had 2 months of N&V and was dx with H pylori gastritis and treated at [facility]. 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 PMH: HTN, thyroid disorder.   OT comments  Pt seen in conjunction with PT.  Pt very lethargic throughout session requiring cues to participate.  She continues with blurry vision, but able to detect colors.  She was able to brush teeth with min A, overall, and was able to stand and take side steps up the edge of the bed.  Continue to recommend CIR.   Follow Up Recommendations  CIR    Equipment Recommendations       Recommendations for Other Services Rehab consult    Precautions / Restrictions Precautions Precautions: Fall Precaution Comments: pt fell at home PTA Restrictions Weight Bearing Restrictions: No       Mobility Bed Mobility Overal bed mobility: Needs Assistance Bed Mobility: Supine to Sit;Sit to Supine     Supine to sit: Max assist Sit to supine: Max assist   General bed mobility comments: maxA to assist with advancing LEs towards EOB and trunk elevation    Transfers Overall transfer level: Needs assistance Equipment used: 2 person hand held assist Transfers: Sit to/from Stand Sit to Stand: Max assist;+2 physical assistance;+2 safety/equipment         General transfer comment: maxA+2 to rise and steady with use of gait belt and bed pad    Balance Overall balance assessment: Needs assistance Sitting-balance support: Feet supported;Bilateral upper extremity supported Sitting balance-Leahy Scale: Poor Sitting balance - Comments: requires min guard-minA to maintain sitting balance. Cues for maintaining midline due to  patient reporting fatigue   Standing balance support: Bilateral upper extremity supported;During functional activity Standing balance-Leahy Scale: Poor Standing balance comment: maxA+2 to maintain standing with B UE support                           ADL either performed or assessed with clinical judgement   ADL Overall ADL's : Needs assistance/impaired     Grooming: Oral care;Minimal assistance;Sitting;Cueing for sequencing Grooming Details (indicate cue type and reason): with prompting, pt brushed teeth with assist to sequence and problem solve through task                 Toilet Transfer: +2 for physical assistance;+2 for safety/equipment;BSC;Maximal assistance   Toileting- Clothing Manipulation and Hygiene: Total assistance       Functional mobility during ADLs: Maximal assistance       Vision   Additional Comments: pt keeps eyes closed majority of the time.  continues to c/o blurriness.  Is able to detect colors accurately   Perception     Praxis      Cognition Arousal/Alertness: Lethargic Behavior During Therapy: Flat affect Overall Cognitive Status: Impaired/Different from baseline Area of Impairment: Safety/judgement;Following commands;Attention;Memory;Problem solving                   Current Attention Level: Selective;Sustained Memory: Decreased short-term memory Following Commands: Follows one step commands inconsistently Safety/Judgement: Decreased awareness of safety;Decreased awareness of deficits Awareness: Intellectual Problem Solving: Slow processing;Requires verbal cues;Requires tactile cues General Comments: Pt keeps eyes closed.  Frequently complains of feeing  fatigued and head feeling heavy.  she required cues to recall what her job was and how many siblings she has.        Exercises Exercises: General Lower Extremity General Exercises - Lower Extremity Long Arc Quad: Both;5 reps;Seated   Shoulder Instructions        General Comments      Pertinent Vitals/ Pain       Pain Assessment: 0-10 Pain Score: 6  Faces Pain Scale: No hurt Pain Location: head and neck Pain Intervention(s): Monitored during session;Limited activity within patient's tolerance;Patient requesting pain meds-RN notified  Home Living                                          Prior Functioning/Environment              Frequency  Min 2X/week        Progress Toward Goals  OT Goals(current goals can now be found in the care plan section)  Progress towards OT goals: Progressing toward goals  Acute Rehab OT Goals Patient Stated Goal: to lay down  Plan Discharge plan remains appropriate    Co-evaluation    PT/OT/SLP Co-Evaluation/Treatment: Yes Reason for Co-Treatment: Necessary to address cognition/behavior during functional activity;For patient/therapist safety;To address functional/ADL transfers PT goals addressed during session: Mobility/safety with mobility;Balance OT goals addressed during session: ADL's and self-care      AM-PAC OT "6 Clicks" Daily Activity     Outcome Measure   Help from another person eating meals?: A Lot Help from another person taking care of personal grooming?: A Lot Help from another person toileting, which includes using toliet, bedpan, or urinal?: A Lot Help from another person bathing (including washing, rinsing, drying)?: A Lot Help from another person to put on and taking off regular upper body clothing?: Total Help from another person to put on and taking off regular lower body clothing?: Total 6 Click Score: 10    End of Session    OT Visit Diagnosis: Unsteadiness on feet (R26.81);Muscle weakness (generalized) (M62.81);Other symptoms and signs involving cognitive function;Low vision, both eyes (H54.2);Pain Pain - part of body:  (headache)   Activity Tolerance Patient limited by lethargy   Patient Left in bed;with call bell/phone within reach;with bed  alarm set;with family/visitor present   Nurse Communication Mobility status        Time: 1225-1250 OT Time Calculation (min): 25 min  Charges: OT General Charges $OT Visit: 1 Visit OT Treatments $Self Care/Home Management : 8-22 mins  Eber Jones OTR/L Acute Rehabilitation Services Pager [phone] Office [phone]    Jeani Hawking M 05/09/2021, 7:08 PM

## 2021-05-09 NOTE — Progress Notes (Signed)
Physical Therapy Treatment Patient Details Name: Diane Todd MRN: [id number] DOB: 2001/11/07 Today's Date: 05/09/2021    History of Present Illness Pt is 20 yo female who presents from [facility] on 05/03/21 with worsening blurry vision and difficulty walking. She has had 2 months of N&V and was dx with H pylori gastritis and treated at [facility]. 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 PMH: HTN, thyroid disorder.    PT Comments    Patient limited by lethargy this session requesting to lay down throughout session. Patient required maxA to perform bed mobility and was able to sit EOB >10 minutes with min guard-minA. Patient performed sit to stand with maxA+2 and HHAx2. She was able to take sidesteps towards [facility] with maxA+2. Patient continues to be limited by weakness, impaired balance, and decreased activity tolerance. Continue to recommend comprehensive inpatient rehab (CIR) for post-acute therapy needs.     Follow Up Recommendations  CIR;Supervision/Assistance - 24 hour     Equipment Recommendations  Other (comment) (TBD)    Recommendations for Other Services       Precautions / Restrictions Precautions Precautions: Fall Precaution Comments: pt fell at home PTA Restrictions Weight Bearing Restrictions: No    Mobility  Bed Mobility Overal bed mobility: Needs Assistance Bed Mobility: Supine to Sit;Sit to Supine     Supine to sit: Max assist Sit to supine: Max assist   General bed mobility comments: maxA to assist with advancing LEs towards EOB and trunk elevation    Transfers Overall transfer level: Needs assistance Equipment used: 2 person hand held assist Transfers: Sit to/from Stand Sit to Stand: Max assist;+2 physical assistance;+2 safety/equipment         General transfer comment: maxA+2 to rise and steady with use of gait belt and bed pad  Ambulation/Gait Ambulation/Gait assistance: Max assist;+2 physical  assistance;+2 safety/equipment Gait Distance (Feet): 2 Feet Assistive device: 2 person hand held assist Gait Pattern/deviations: Step-to pattern;Decreased step length - right;Decreased step length - left;Decreased stride length;Decreased weight shift to right;Decreased weight shift to left;Shuffle;Wide base of support Gait velocity: decreased   General Gait Details: sidesteps towards HOB. Required maxA+2 to maintain balance and weight shift L/R. Cues for upright posture. Patient reporting "my head feels heavy"   Stairs             Wheelchair Mobility    Modified Rankin (Stroke Patients Only)       Balance Overall balance assessment: Needs assistance Sitting-balance support: Feet supported;Bilateral upper extremity supported Sitting balance-Leahy Scale: Poor Sitting balance - Comments: requires min guard-minA to maintain sitting balance. Cues for maintaining midline due to patient reporting fatigue   Standing balance support: Bilateral upper extremity supported;During functional activity Standing balance-Leahy Scale: Poor Standing balance comment: maxA+2 to maintain standing with B UE support                            Cognition Arousal/Alertness: Lethargic Behavior During Therapy: Flat affect Overall Cognitive Status: Difficult to assess Area of Impairment: Safety/judgement;Following commands;Attention;Memory                   Current Attention Level: Selective Memory: Decreased short-term memory Following Commands: Follows one step commands inconsistently Safety/Judgement: Decreased awareness of safety;Decreased awareness of deficits            Exercises General Exercises - Lower Extremity Long Arc Quad: Both;5 reps;Seated    General Comments  Pertinent Vitals/Pain Pain Assessment: Faces Faces Pain Scale: No hurt Pain Intervention(s): Monitored during session    Home Living                      Prior Function             PT Goals (current goals can now be found in the care plan section) Acute Rehab PT Goals Patient Stated Goal: to lay down PT Goal Formulation: With patient Time For Goal Achievement: 05/18/21 Potential to Achieve Goals: Good Progress towards PT goals: Progressing toward goals    Frequency    Min 3X/week      PT Plan Current plan remains appropriate    Co-evaluation PT/OT/SLP Co-Evaluation/Treatment: Yes Reason for Co-Treatment: For patient/therapist safety;To address functional/ADL transfers;Complexity of the patient's impairments (multi-system involvement) PT goals addressed during session: Mobility/safety with mobility;Balance        AM-PAC PT "6 Clicks" Mobility   Outcome Measure  Help needed turning from your back to your side while in a flat bed without using bedrails?: A Lot Help needed moving from lying on your back to sitting on the side of a flat bed without using bedrails?: A Lot Help needed moving to and from a bed to a chair (including a wheelchair)?: Total Help needed standing up from a chair using your arms (e.g., wheelchair or bedside chair)?: Total Help needed to walk in hospital room?: Total Help needed climbing 3-5 steps with a railing? : Total 6 Click Score: 8    End of Session Equipment Utilized During Treatment: Gait belt Activity Tolerance: Patient limited by lethargy Patient left: in bed;with call bell/phone within reach;with bed alarm set;with family/visitor present Nurse Communication: Mobility status PT Visit Diagnosis: Muscle weakness (generalized) (M62.81);History of falling (Z91.81);Difficulty in walking, not elsewhere classified (R26.2)     Time: 9983-3825 PT Time Calculation (min) (ACUTE ONLY): 25 min  Charges:  $Therapeutic Activity: 8-22 mins                     Rayansh Herbst A. Dan Humphreys PT, DPT Acute Rehabilitation Services Pager [phone] Office [phone]    Viviann Spare 05/09/2021, 4:34 PM

## 2021-05-10 LAB — CBC WITH DIFFERENTIAL/PLATELET
Abs Immature Granulocytes: 0.27 10*3/uL — ABNORMAL HIGH (ref 0.00–0.07)
Basophils Absolute: 0 10*3/uL (ref 0.0–0.1)
Basophils Relative: 0 %
Eosinophils Absolute: 0 10*3/uL (ref 0.0–0.5)
Eosinophils Relative: 0 %
HCT: 42.8 % (ref 36.0–46.0)
Hemoglobin: 13.7 g/dL (ref 12.0–15.0)
Immature Granulocytes: 3 %
Lymphocytes Relative: 20 %
Lymphs Abs: 1.8 10*3/uL (ref 0.7–4.0)
MCH: 28.1 pg (ref 26.0–34.0)
MCHC: 32 g/dL (ref 30.0–36.0)
MCV: 87.7 fL (ref 80.0–100.0)
Monocytes Absolute: 0.5 10*3/uL (ref 0.1–1.0)
Monocytes Relative: 6 %
Neutro Abs: 6.4 10*3/uL (ref 1.7–7.7)
Neutrophils Relative %: 71 %
Platelets: 130 10*3/uL — ABNORMAL LOW (ref 150–400)
RBC: 4.88 MIL/uL (ref 3.87–5.11)
RDW: 18.3 % — ABNORMAL HIGH (ref 11.5–15.5)
WBC: 9 10*3/uL (ref 4.0–10.5)
nRBC: 0 % (ref 0.0–0.2)

## 2021-05-10 LAB — COMPREHENSIVE METABOLIC PANEL
ALT: 28 U/L (ref 0–44)
AST: 33 U/L (ref 15–41)
Albumin: 3.2 g/dL — ABNORMAL LOW (ref 3.5–5.0)
Alkaline Phosphatase: 20 U/L — ABNORMAL LOW (ref 38–126)
Anion gap: 3 — ABNORMAL LOW (ref 5–15)
BUN: 20 mg/dL (ref 6–20)
CO2: 23 mmol/L (ref 22–32)
Calcium: 7.8 mg/dL — ABNORMAL LOW (ref 8.9–10.3)
Chloride: 113 mmol/L — ABNORMAL HIGH (ref 98–111)
Creatinine, Ser: 0.93 mg/dL (ref 0.44–1.00)
GFR, Estimated: >60 mL/min (ref >60)
Glucose, Bld: 87 mg/dL (ref 70–99)
Potassium: 4.7 mmol/L (ref 3.5–5.1)
Sodium: 139 mmol/L (ref 135–145)
Total Bilirubin: 1.2 mg/dL (ref 0.3–1.2)
Total Protein: 4.4 g/dL — ABNORMAL LOW (ref 6.5–8.1)

## 2021-05-10 LAB — URINE CULTURE: Culture: >=100000 — AB

## 2021-05-10 MED ORDER — ACD FORMULA A 0.73-2.45-2.2 GM/100ML VI SOLN
Status: AC
  Administered 2021-05-10: 1000 mL
  Filled 2021-05-10: qty 500

## 2021-05-10 MED ORDER — CLARITHROMYCIN 500 MG PO TABS
500.0000 mg | ORAL_TABLET | Freq: Two times a day (BID) | ORAL | Status: DC
  Administered 2021-05-10 – 2021-05-16 (×12): 500 mg via ORAL
  Filled 2021-05-10 (×17): qty 1

## 2021-05-10 MED ORDER — ACD FORMULA A 0.73-2.45-2.2 GM/100ML VI SOLN
1000.0000 mL | Status: DC
  Filled 2021-05-10: qty 1000

## 2021-05-10 MED ORDER — DIPHENHYDRAMINE HCL 25 MG PO CAPS: 25.0000 mg | ORAL_CAPSULE | Freq: Four times a day (QID) | ORAL | Status: DC | PRN

## 2021-05-10 MED ORDER — ADULT MULTIVITAMIN W/MINERALS CH
1.0000 | ORAL_TABLET | Freq: Every day | ORAL | Status: DC
  Administered 2021-05-11 – 2021-05-17 (×7): 1 via ORAL
  Filled 2021-05-10 (×7): qty 1

## 2021-05-10 MED ORDER — CALCIUM GLUCONATE-NACL 2-0.675 GM/100ML-% IV SOLN
INTRAVENOUS | Status: AC
  Filled 2021-05-10: qty 100

## 2021-05-10 MED ORDER — PANTOPRAZOLE SODIUM 40 MG PO TBEC
40.0000 mg | DELAYED_RELEASE_TABLET | Freq: Two times a day (BID) | ORAL | Status: DC
  Administered 2021-05-10 – 2021-05-17 (×13): 40 mg via ORAL
  Filled 2021-05-10 (×14): qty 1

## 2021-05-10 MED ORDER — CALCIUM GLUCONATE-NACL 2-0.675 GM/100ML-% IV SOLN
2.0000 g | Freq: Once | INTRAVENOUS | Status: AC
  Administered 2021-05-10: 2000 mg via INTRAVENOUS
  Filled 2021-05-10: qty 100

## 2021-05-10 MED ORDER — CALCIUM GLUCONATE-NACL 2-0.675 GM/100ML-% IV SOLN
INTRAVENOUS | Status: AC
  Administered 2021-05-10: 2000 mg via INTRAVENOUS
  Filled 2021-05-10: qty 100

## 2021-05-10 MED ORDER — CALCIUM CARBONATE ANTACID 500 MG PO CHEW
2.0000 | CHEWABLE_TABLET | ORAL | Status: DC
  Administered 2021-05-10: 400 mg via ORAL
  Filled 2021-05-10: qty 2

## 2021-05-10 MED ORDER — BOOST / RESOURCE BREEZE PO LIQD CUSTOM
1.0000 | Freq: Three times a day (TID) | ORAL | Status: DC
  Administered 2021-05-10 – 2021-05-13 (×9): 1 via ORAL

## 2021-05-10 MED ORDER — ANTICOAGULANT SODIUM CITRATE 4% (200MG/5ML) IV SOLN
5.0000 mL | Freq: Once | Status: AC
  Administered 2021-05-10: 5 mL
  Filled 2021-05-10 (×2): qty 5

## 2021-05-10 MED ORDER — ACETAMINOPHEN 325 MG PO TABS: 650.0000 mg | ORAL_TABLET | ORAL | Status: DC | PRN

## 2021-05-10 MED ORDER — SODIUM CHLORIDE 0.9 % IV SOLN
INTRAVENOUS | Status: AC
  Filled 2021-05-10 (×3): qty 200

## 2021-05-10 MED ORDER — AMOXICILLIN 500 MG PO CAPS
1000.0000 mg | ORAL_CAPSULE | Freq: Two times a day (BID) | ORAL | Status: DC
  Administered 2021-05-10 – 2021-05-15 (×10): 1000 mg via ORAL
  Filled 2021-05-10 (×13): qty 2

## 2021-05-10 NOTE — Plan of Care (Signed)

## 2021-05-10 NOTE — Progress Notes (Signed)
PT Cancellation Note  Patient Details Name: Diane Todd MRN: [id number] DOB: 2001/06/14   Cancelled Treatment:    Reason Eval/Treat Not Completed: Patient at procedure or test/unavailable Patient off unit. PT will re-attempt as time allows.   Nyemah Watton A. Dan Humphreys PT, DPT Acute Rehabilitation Services Pager [phone] Office [phone]    Diane Todd 05/10/2021, 4:21 PM

## 2021-05-10 NOTE — Progress Notes (Signed)
Pt noted to have HR non-sustained in 150's per telemetry. But HR sustaining in 140's. All while pt received HS meds. Pt's care nurse informed. Will conti to monitor.

## 2021-05-10 NOTE — Progress Notes (Signed)
Inpatient Rehabilitation Admissions Coordinator   I will follow up with her progress next week.  Ottie Glazier, RN, MSN Rehab Admissions Coordinator [phone] 05/10/2021 12:02 PM

## 2021-05-10 NOTE — Progress Notes (Signed)
NEUROLOGY CONSULTATION PROGRESS NOTE   Date of service: May 10, 2021 Patient Name: Diane Todd MRN:  [id number] DOB:  [birth year]  Brief HPI  Diane Todd is a 20 y.o. female with PMH significant for recent Hpylori infection, intractable nausea with poor po intake since April 2022, who initially presented to OSH on 6/16 with 2.5 weeks hx of blurred vision which progressed to ataxia. 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  MRI Brain and C spine with and w/o contrast with hyperintense T2-weighted signal and mild contrast enhancement within the medial thalami and  periaqueductal gray matter. MRI Orbits with no significant abnormality. 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  She did not have significant improvement with steroids and so was started on Plasma Exchange.  MOG Ab is negative.  AQP4 Ab is pending, IgG index is pending, OCB pending.   Interval Hx   Sister at bedside, some improvement in somnolence today and more interactive per sister. Willing to attempt PO intake today, reports she is scared.  Vitals   Vitals:   05/09/21 2102 05/09/21 2313 05/10/21 0346 05/10/21 0351  BP: 103/64 (!) 102/57 110/65   Pulse: (!) 49 (!) 50 (!) 49   Resp: 18 18 18    Temp: 98 F (36.7 C) 97.6 F (36.4 C) 98.4 F (36.9 C)   TempSrc: Oral Oral Oral   SpO2: 98% 99% 99%   Weight:    97.6 kg  Height:         Body mass index is 33.7 kg/m.  Physical Exam   General: Laying comfortably in bed; in no acute distress. HENT: Normal oropharynx and mucosa. Normal external appearance of ears and nose. Neck: Supple, no pain or tenderness CV: No JVD. No peripheral edema. Pulmonary: Symmetric Chest rise. Normal respiratory effort. Abdomen: Soft to touch, non-tender. Ext: No cyanosis, edema, or deformity Skin: No rash. Normal palpation of skin.   Musculoskeletal: Normal digits and nails by inspection. No clubbing.  Neurologic Examination  Mental status/Cognition: somnolent, oriented to self, slowed and soft speech.  Cranial nerves:   CN II Pupils equal and reactive to light, no VF deficits   CN III,IV,VI Dysconjugate eyes. Opthalmoplegia is mildly improved today with still pretty limited lateral gaze. Appears more like INO today.   CN V normal sensation in V1, V2, and V3 segments bilaterally   CN VII no asymmetry, no nasolabial fold flattening   CN VIII normal hearing to speech   CN IX & X normal palatal elevation, no uvular deviation   CN XI    CN XII midline tongue protrusion   Motor:  Muscle bulk: normal, tone normal Mvmt Root Nerve  Muscle Right Left Comments  SA C5/6 Ax Deltoid     EF C5/6 Mc Biceps 5 5   EE C6/7/8 Rad Triceps 5 5   WF C6/7 Med FCR     WE C7/8 PIN ECU     F Ab C8/T1 U ADM/FDI 4+ 4+   HF L1/2/3 Fem Illopsoas 4 4   KE L2/3/4 Fem Quad 4 4   DF L4/5 D Peron Tib Ant 5 5   PF S1/2 Tibial Grc/Sol 5 5    Sensation:  Light touch Intact throughout   Pin prick    Temperature    Vibration   Proprioception    Coordination/Complex Motor:  - Finger to Nose intact  BL with mild ataxia. - Heel to shin unable to do. - Rapid alternating movement are slowed. - Gait: Deferred.  Labs   Basic Metabolic Panel:  Lab Results  Component Value Date   NA 139 05/10/2021   K 4.7 05/10/2021   CO2 23 05/10/2021   GLUCOSE 87 05/10/2021   BUN 20 05/10/2021   CREATININE 0.93 05/10/2021   CALCIUM 7.8 (L) 05/10/2021   GFRNONAA >60 05/10/2021   HbA1c:  Lab Results  Component Value Date   HGBA1C 5.8 (H) 05/04/2021   LDL: No results found for: [facility] Urine Drug Screen: No results found for: LABOPIA, COCAINSCRNUR, LABBENZ, AMPHETMU, THCU, LABBARB  Alcohol Level No results found for: ETH No results found for: PHENYTOIN, ZONISAMIDE, LAMOTRIGINE, LEVETIRACETA No results found for: PHENYTOIN, PHENOBARB, VALPROATE,  CBMZ  Imaging and Diagnostic studies   MRI Orbits with and without contrast: 1. Normal MRI appearance of the orbits. 2. Subtle enhancement along the periaqueductal gray matter and medial thalami bilaterally. This corresponds to the abnormal signal changes on the MRI from yesterday. In addition to a demyelinating process, or Wernicke encephalopathy (thiamine deficiency encephalopathy) is also considered.  MRI Brain with and without cotnrast(personally reviewed): 1. Hyperintense T2-weighted signal and mild contrast enhancement within the medial thalami and periaqueductal gray matter. Demyelinating disease remains a primary consideration. MRI of the orbits with and without contrast might be helpful for better characterization of the optic nerves. CSF sampling should also be considered. 2. No demyelinating lesions of the cervical or thoracic spinal cord.  MRI C and T spine with and without contrast: No demyelinating lesions of the cervical or thoracic spinal cord.  CSF studies: Glucose: 80 Protein: 45 WBC: 8 OCB: pending IgG index: pending.  Impression   Diane Todd is a 20 y.o. female with PMH significant for recent Hpylori infection, intractable nausea with poor po intake since April 2022, who initially presented to OSH on 6/16 with 2.5 weeks hx of blurred vision which progressed to ataxia. 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  Involvement of the medial thalami might explain decreased arousal and involvement of the periaqueductal gray might explain her nausea.  There is some improvement in her somnolence but still continues to have significant nausea with minimal to no improvement and still has significant ophthalmoplegia with blurred vision.  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  Recommendations  - Completed 5 days of IV Solumderol. -  on PLEX, session 2 of 5 today - AQP4 pending. - Thiamine levels are pending. Continue Thiamine. - We will continue to follow along. - Hospitalist team to help with treatment of Hpylori.  Plan discussed with patient's sister at bedside and over phone. Plan discussed with Dr. Charissa Bash secure chat.  _______________________________________________________________   Thank you for the opportunity to take part in the care of this patient. If you have any further questions, please contact the neurology consultation attending.  Signed,  Erick Blinks [facility] Pager Number [phone]

## 2021-05-10 NOTE — Progress Notes (Signed)
Nutrition Follow-up  DOCUMENTATION CODES:  Severe malnutrition in context of acute illness/injury  INTERVENTION:  Add Boost Breeze po TID, each supplement provides 250 kcal and 9 grams of protein.  Add MVI with minerals daily.  Recommend Cortrak placement and starting tube feeding on Monday, 6/27 if pt does not tolerate past full liquids over the weekend.  Osmolite 1.5 @ 36ml/hr via Cortrak (Initiate at 82ml/hr and increase by 10 ml every 8 hours until goal rate is reached) ProSource TF 64ml BID via tube Free water flushes q4 hours  Regimen provides 2420 kcal, 120 gm protein, and 1908 ml free water.   Monitor magnesium, potassium, and phosphorus daily for at least 3 days, MD to replete as needed, as pt is at risk for refeeding syndrome.  NUTRITION DIAGNOSIS:  Severe Malnutrition related to acute illness as evidenced by energy intake < or equal to 50% for > or equal to 1 month, percent weight loss. - ongoing  GOAL:  Patient will meet greater than or equal to 90% of their needs - not meeting  MONITOR:  PO intake, Supplement acceptance, Labs, Weight trends, Skin, I & O's  REASON FOR ASSESSMENT:  Malnutrition Screening Tool    ASSESSMENT:  20 y.o. female with history of hypertension, hypothyroidism and H. pylori gastritis who presents as transfer from [facility] with concern for neuromyelitis optica spectrum disorder. 6/18 - regular diet with Magic Cups on all trays 6/19 - NPO 6/22 - Plex catheter placement 6/24 - advanced to clear liquids   RD working remotely.   Attempted to call pt. Pt's sister answered and reiterated information from previous RD's note.  Admit wt: 98.6 kg Current wt: 97.6 kg  Secure chatted MD and recommend consideration of post-pyloric Cortrak tube placement and nutrition support. MD and RD agreed to give her the weekend to allow her diet to advance and see if she tolerates it. If pt does not tolerate past full liquids, Cortrak tube will be  placed ordered to be placed on Monday.  In the meantime, continue advancing diet as tolerated. Add Boost Breeze TID and MVI with minerals daily.  Medications: reviewed; amoxicillin BID, TUMS every 3 hrs, colace BID, Synthroid injection, Protonix BID, thiamine via IV TID, anti-coagulant solutions via IV  Labs: reviewed  Diet Order:   Diet Order             Diet clear liquid Room service appropriate? Yes; Fluid consistency: Thin  Diet effective now                  EDUCATION NEEDS:  Education needs have been addressed  Skin:  Skin Assessment: Reviewed RN Assessment (ecchymosis)  Last BM:  05/03/21 - OBR in place  Height:  Ht Readings from Last 1 Encounters:  05/04/21 5\' 7"  (1.702 m)   Weight:  Wt Readings from Last 1 Encounters:  05/10/21 97.6 kg   Ideal Body Weight:  61.36 kg  BMI:  Body mass index is 33.7 kg/m.  Estimated Nutritional Needs:  Kcal:  2200-2500kcal/day Protein:  110-125g/day Fluid:  1.9-2.2L/day  05/12/21, RD, LDN (she/her/hers) Registered Dietitian I After-Hours/Weekend Pager # in [place]

## 2021-05-10 NOTE — Progress Notes (Signed)
  Speech Language Pathology Treatment: Dysphagia  Patient Details Name: Emoree Sasaki MRN: [id number] DOB: 08-05-2001 Today's Date: 05/10/2021 Time: 1020-1030 SLP Time Calculation (min) (ACUTE ONLY): 10 min  Assessment / Plan / Recommendation Clinical Impression  Pt seen with sister at bedside. Pt is awake and alert, responds to commands and questions appropriately. She is not interested in PO however. Pt needed encouragement to accept three sips of water. No signs of aspiration observed, Swallow appeared adequate. However, pt reported fear, said the water felt "foreign." Her sister explains that prior to this hospitalization pt was not eating well, had abdominal pain and had an endoscopy and was found to have H.Pylori. She has not been taking her medication however because she hasn't been swallowing. Overall, pt appears physically capable of an oropharyngeal swallow, though intake may be limited. Recommend starting out with clear liquids. Encouraged sister at bedside to offer sips often with the hope that pt will feel more confident.    HPI HPI: Kelleen Heide Guile is a 20 y.o. 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  Per review of chart patient has been to the [facility] ED multiple times over the past 2 months for range of issues.  Over this time she has consistently endorsed chronic nausea and vomiting.  Has also had work-up for gastritis and found to have H. pylori.  Also noted to have baseline creatinine of around 1.5 with unclear chronicity.  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  MRI of the brain was showing hyperintense T2-weighted signal and mild contrast enhancement within the  medial thalami and periaqueductal gray matter concering for demyelinating disease.      SLP Plan  Continue with current plan of care       Recommendations  Diet recommendations: Thin liquid Liquids provided via: Straw;Cup Medication Administration: Whole meds with liquid Supervision: Trained caregiver to feed patient;Staff to assist with self feeding                Oral Care Recommendations: Oral care BID Follow up Recommendations: 24 hour supervision/assistance Plan: Continue with current plan of care       GO                Treyvin Glidden, Riley Nearing 05/10/2021, 11:07 AM

## 2021-05-10 NOTE — Progress Notes (Signed)
PROGRESS NOTE    Diane Todd  JME:[id number] DOB: 09-28-2001 DOA: 05/03/2021 PCP: Pcp, No   Brief Narrative:  Diane Todd is an 20 y.o. female past medical history of essential hypertension hypothyroidism transferred from [facility] for concern after her MRI showed multiple brain abnormalities she has been having generalized weakness poor appetite and blurry vision.   Neurology was consulted and appreciate assistance, recommended serum for AQP4-IgG antibody and MOG-IgG antibody test which have been sent and are pending. She will need 5 days of high-dose steroids and 3 days of high-dose thiamine and then can be switched to orals. Physical therapy evaluated the patient recommended inpatient rehab. 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 Patient is not able to take orals currently NPO. Physical therapy evaluated the patient recommended inpatient rehab. Placed on IV fluids half-normal saline with D5, see below nutrition for further details.   Essential hypertension: Blood pressures well controlled, on metoprolol.   Acute Kidney injury on chronic kidney disease stage III: On IV fluids due to decreased oral intake. Basic metabolic panels  pending.   Hypothyroidism TSH of 0.3.   History of H. pylori: Treatment initated today 6/24 Amoxicillin 1g bid, clarithromycin 500mg  bid, ppi bid all for 14 days   Nutrition: Check albumin, if not tolerating her diet by Friday will place NG tube and start tube feeding.  DVT prophylaxis: SCD/Compression stockings  Code Status:     Code Status Orders  (From admission, onward)           Start     Ordered   05/04/21 0802  Full code  Continuous        05/04/21 0801           Code Status History     Date Active Date Inactive Code Status Order ID Comments User Context   05/04/2021 0309 05/04/2021 0801 Full Code 05/06/2021  [id number], MD Inpatient      Family Communication: with sister at bedside Disposition Plan:   anticipate rehab Consults called: None Admission status: Inpatient Status is: Inpatient   Remains inpatient appropriate because:Hemodynamically unstable   Dispo: The patient is from: Home              Anticipated d/c is to: Home              Patient currently is not medically stable to d/c.              Difficult to place patient No  Consultants:  neuro  Procedures:  MR BRAIN W WO CONTRAST  Result Date: 05/04/2021 CLINICAL DATA:  Weakness and blurry vision. EXAM: MRI HEAD WITH AND WITHOUT CONTRAST MRI CERVICAL AND THORACIC SPINE WITHOUT AND WITH CONTRAST TECHNIQUE: Multiplanar and multiecho pulse sequences of the head and cervical spine, to include the craniocervical junction and cervicothoracic junction, and the  thoracic spine, were obtained without and with intravenous contrast. CONTRAST:  73mL GADAVIST GADOBUTROL 1 MMOL/ML IV SOLN COMPARISON:  None. FINDINGS: MRI BRAIN FINDINGS Brain: No acute infarct, mass effect or extra-axial collection. No acute or chronic hemorrhage. There is hyperintense T2-weighted signal within the medial thalami and periaqueductal gray matter. There is mild contrast enhancement of the periaqueductal gray matter but no  other enhancing lesion. The midline structures are normal. Vascular: Major flow voids are preserved. Skull and upper cervical spine: Normal calvarium and skull base. Visualized upper cervical spine and soft tissues are normal. Sinuses/Orbits:No paranasal sinus fluid levels or advanced mucosal thickening. No mastoid or middle ear effusion. Normal orbits. MRI CERVICAL SPINE FINDINGS Alignment: Physiologic. Vertebrae: No fracture, evidence of discitis, or bone lesion. Cord: Normal signal and morphology. No abnormal contrast enhancement. Posterior Fossa, vertebral arteries, paraspinal tissues: Negative. Disc levels: No spinal canal or neural foraminal stenosis. MRI THORACIC SPINE FINDINGS Alignment:  Physiologic. Vertebrae: No fracture, evidence of discitis, or bone lesion. Cord: Normal signal and morphology. No abnormal contrast enhancement Paraspinal and other soft tissues: Negative. Disc levels: T7-8: Small right subarticular disc protrusion without stenosis. T8-9: Small left subarticular disc protrusion without stenosis. The other thoracic disc levels are normal. IMPRESSION: 1. Hyperintense T2-weighted signal and mild contrast enhancement within the medial thalami and periaqueductal gray matter. Demyelinating disease remains a primary consideration. MRI of the orbits with and without contrast might be helpful for better characterization of the optic nerves. CSF sampling should also be considered. 2. No demyelinating lesions of the cervical or thoracic spinal cord. Electronically Signed   By: Deatra Robinson M.D.   On: 05/04/2021 02:42   MR CERVICAL SPINE W WO CONTRAST  Result Date: 05/04/2021 CLINICAL DATA:  Weakness and blurry vision. EXAM: MRI HEAD WITH AND WITHOUT CONTRAST MRI CERVICAL AND THORACIC SPINE WITHOUT AND WITH CONTRAST TECHNIQUE: Multiplanar and multiecho pulse sequences of the head and cervical spine, to include the craniocervical junction and cervicothoracic junction, and the thoracic spine, were  obtained without and with intravenous contrast. CONTRAST:  93mL GADAVIST GADOBUTROL 1 MMOL/ML IV SOLN COMPARISON:  None. FINDINGS: MRI BRAIN FINDINGS Brain: No acute infarct, mass effect or extra-axial collection. No acute or chronic hemorrhage. There is hyperintense T2-weighted signal within the medial thalami and periaqueductal gray matter. There is mild contrast enhancement of the periaqueductal gray matter but no other enhancing lesion. The midline structures are normal. Vascular: Major flow voids are preserved. Skull and upper cervical spine: Normal calvarium and skull base. Visualized upper cervical spine and soft tissues are normal. Sinuses/Orbits:No paranasal sinus fluid levels or advanced mucosal thickening. No mastoid or middle ear effusion. Normal orbits. MRI CERVICAL SPINE FINDINGS Alignment: Physiologic. Vertebrae: No fracture, evidence of discitis, or bone lesion. Cord: Normal signal and morphology. No abnormal contrast enhancement. Posterior Fossa, vertebral arteries, paraspinal tissues: Negative. Disc levels: No spinal canal or neural foraminal stenosis. MRI THORACIC SPINE FINDINGS Alignment:  Physiologic. Vertebrae: No fracture, evidence of discitis, or bone lesion. Cord: Normal signal and morphology. No abnormal contrast enhancement Paraspinal and other soft tissues: Negative. Disc levels: T7-8: Small right subarticular disc protrusion without stenosis. T8-9: Small left subarticular disc protrusion without stenosis. The other thoracic disc levels are normal. IMPRESSION: 1. Hyperintense T2-weighted signal and mild contrast enhancement within the medial thalami and periaqueductal gray matter. Demyelinating disease remains a primary consideration. MRI of the orbits with and without contrast might be helpful for better characterization of the optic nerves. CSF sampling should also be considered. 2. No  demyelinating lesions of the cervical or thoracic spinal cord. Electronically Signed   By: Deatra Robinson M.D.   On: 05/04/2021 02:42   MR THORACIC SPINE W WO CONTRAST  Result Date: 05/04/2021 CLINICAL DATA:  Weakness and blurry vision. EXAM: MRI HEAD WITH AND WITHOUT CONTRAST MRI CERVICAL AND THORACIC SPINE WITHOUT AND WITH CONTRAST TECHNIQUE: Multiplanar and multiecho pulse sequences of the head and cervical spine, to include the craniocervical junction and cervicothoracic junction, and the thoracic spine, were obtained without and with intravenous contrast. CONTRAST:  10mL GADAVIST GADOBUTROL 1 MMOL/ML IV SOLN COMPARISON:  None. FINDINGS: MRI BRAIN FINDINGS Brain: No acute infarct, mass effect or extra-axial collection. No acute or chronic hemorrhage. There is hyperintense T2-weighted signal within the medial thalami and periaqueductal gray matter. There is mild contrast enhancement of the periaqueductal gray matter but no other enhancing lesion. The midline structures are normal. Vascular: Major flow voids are preserved. Skull and upper cervical spine: Normal calvarium and skull base. Visualized upper cervical spine and soft tissues are normal. Sinuses/Orbits:No paranasal sinus fluid levels or advanced mucosal thickening. No mastoid or middle ear effusion. Normal orbits. MRI CERVICAL SPINE FINDINGS Alignment: Physiologic. Vertebrae: No fracture, evidence of discitis, or bone lesion. Cord: Normal signal and morphology. No abnormal contrast enhancement. Posterior Fossa, vertebral arteries, paraspinal tissues: Negative. Disc levels: No spinal canal or neural foraminal stenosis. MRI THORACIC SPINE FINDINGS Alignment:  Physiologic. Vertebrae: No fracture, evidence of discitis, or bone lesion. Cord: Normal signal and morphology. No abnormal contrast enhancement Paraspinal and other soft tissues: Negative. Disc levels: T7-8: Small right subarticular disc protrusion without stenosis. T8-9: Small left subarticular disc protrusion without stenosis. The other thoracic disc levels are normal. IMPRESSION: 1.  Hyperintense T2-weighted signal and mild contrast enhancement within the medial thalami and periaqueductal gray matter. Demyelinating disease remains a primary consideration. MRI of the orbits with and without contrast might be helpful for better characterization of the optic nerves. CSF sampling should also be considered. 2. No demyelinating lesions of the cervical or thoracic spinal cord. Electronically Signed   By: Deatra Robinson M.D.   On: 05/04/2021 02:42   US RENAL  Result Date: 05/04/2021 CLINICAL DATA:  Chronic renal disease. EXAM: RENAL / URINARY TRACT ULTRASOUND COMPLETE COMPARISON:  CT abdomen and pelvis March 14, 2021. FINDINGS: Right Kidney: Renal measurements: 10.2 x 4.2 x 5.0 cm = volume: 112.0 mL. Mild renal cortical thinning. No hydronephrosis. Left Kidney: Renal measurements: May 0.4 x 4.5 x 3.9 cm = volume: 76.7 mL. Mild renal cortical thinning. No hydronephrosis. Bladder: Decompressed with Foley catheter. Other: None. IMPRESSION: No hydronephrosis. Electronically Signed   By: Annia Belt M.D.   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 Catheter needed for plasmapheresis. EXAM: FLUOROSCOPIC AND ULTRASOUND GUIDED PLACEMENT OF A NON-TUNNELED DIALYSIS CATHETER Physician: Rachelle Hora. Henn, MD MEDICATIONS: 1% lidocaine ANESTHESIA/SEDATION: None FLUOROSCOPY TIME:  Fluoroscopy Time: 12 seconds, 7 mGy COMPLICATIONS: None immediate. PROCEDURE: Informed consent was obtained for catheter placement. The patient was placed supine on the interventional table. Ultrasound confirmed a patent right internal jugular vein. Ultrasound images were obtained for documentation. The right neck was prepped and draped in a sterile fashion. The right neck was anesthetized with 1% lidocaine. Maximal barrier sterile technique was utilized including caps, mask, sterile gowns, sterile gloves, sterile drape, hand hygiene and skin  antiseptic. A small incision was made with #11 blade scalpel. A 21 gauge needle directed  into the right internal jugular vein with ultrasound guidance. A micropuncture dilator set was placed. A 15 cm Trialysis was selected. The catheter was advanced over a wire and positioned at the superior cavoatrial junction. Fluoroscopic images were obtained for documentation. Both dialysis lumens were found to aspirate and flush well. The proper amount of heparin was flushed in both lumens. The central venous lumen was flushed with normal saline. Catheter was sutured to skin. FINDINGS: Catheter tip at the superior cavoatrial junction. IMPRESSION: Successful placement of a right jugular non-tunneled dialysis/pheresis catheter using ultrasound and fluoroscopic guidance. Electronically Signed   By: Richarda Overlie M.D.   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 Catheter needed for plasmapheresis.   EXAM: FLUOROSCOPIC AND ULTRASOUND GUIDED PLACEMENT OF A NON-TUNNELED DIALYSIS CATHETER   Physician: Rachelle Hora. Henn, MD   MEDICATIONS: 1% lidocaine   ANESTHESIA/SEDATION: None   FLUOROSCOPY TIME:  Fluoroscopy Time: 12 seconds, 7 mGy   COMPLICATIONS: None immediate.   PROCEDURE: Informed consent was obtained for catheter placement. The patient was placed supine on the interventional table. Ultrasound confirmed a patent right internal jugular vein. Ultrasound images were obtained for documentation. The right neck was prepped and draped in a sterile fashion. The right neck was anesthetized with 1% lidocaine. Maximal barrier sterile technique was utilized including caps, mask, sterile gowns, sterile gloves, sterile drape, hand hygiene and skin antiseptic. A small incision was made with #11 blade scalpel. A 21 gauge needle directed into the right internal jugular vein with ultrasound guidance. A micropuncture dilator set was placed. A 15 cm Trialysis was selected. The  catheter was advanced over a wire and positioned at the superior cavoatrial junction. Fluoroscopic images were obtained for documentation. Both dialysis lumens were found to aspirate and flush well. The proper amount of heparin was flushed in both lumens. The central venous lumen was flushed with normal saline. Catheter was sutured to skin.   FINDINGS: Catheter tip at the superior cavoatrial junction.   IMPRESSION: Successful placement of a right jugular non-tunneled dialysis/pheresis catheter using ultrasound and fluoroscopic guidance.     Electronically Signed   By: Richarda Overlie M.D.   On: 05/08/2021 16:58    DG FL GUIDED LUMBAR PUNCTURE  Result Date: 05/08/2021 CLINICAL DATA:  Optic neuritis, altered mental status EXAM: DIAGNOSTIC LUMBAR PUNCTURE UNDER FLUOROSCOPIC GUIDANCE COMPARISON:  None FLUOROSCOPY TIME:  Fluoroscopy Time:  12 seconds Radiation Exposure Index (if provided by the fluoroscopic device): 1.5 mGy Number of Acquired Spot Images: 0 PROCEDURE: Informed consent was obtained from the patient prior to the procedure, including potential complications of headache, allergy, and pain. With the patient prone, the lower back was prepped with Betadine. 1% Lidocaine was used for local anesthesia. Lumbar puncture was performed at the L4-L5 level using a 20 gauge needle with return of clear CSF with an opening pressure of 17 cm water. Approximately 13 ml of CSF were obtained for laboratory studies. The patient tolerated the procedure well and there were no apparent complications. IMPRESSION: Successful lumbar puncture. Approximately 13 mL of clear CSF were obtained and sent to the laboratory for analysis. There were no immediate complications. Electronically Signed   By: Caprice Renshaw   On: 05/08/2021 09:48   MR ORBITS W WO CONTRAST  Result Date: 05/05/2021 CLINICAL DATA:  Optic neuritis suspected. EXAM: MRI OF THE ORBITS WITHOUT AND WITH CONTRAST TECHNIQUE: Multiplanar, multi-echo pulse sequences of the  orbits and surrounding structures were  acquired including fat saturation techniques, before and after intravenous contrast administration. CONTRAST:  10mL GADAVIST GADOBUTROL 1 MMOL/ML IV SOLN COMPARISON:  Abnormal MRI of the brain. FINDINGS: Orbits: Globes are within normal limits bilaterally. The optic nerves are unremarkable. Chiasm is within normal limits. Abnormal signal or enhancement is present. Extraocular muscles are within normal limits. Visualized sinuses: Polyp or mucous retention cyst is noted in the left maxillary sinus. The paranasal sinuses and mastoid air cells are otherwise clear. Soft tissues: Periorbital soft tissues and visualized facial soft tissues are within normal limits. 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 There is subtle enhancement along the periaqueductal gray matter and medial thalami. IMPRESSION: 1. Normal MRI appearance of the orbits. 2. Subtle enhancement along the periaqueductal gray matter and medial thalami bilaterally. This corresponds to the abnormal signal changes on the MRI from yesterday. In addition to a demyelinating process, or Wernicke encephalopathy (thiamine deficiency encephalopathy) is also considered. Electronically Signed   By: Marin Robertshristopher  Mattern M.D.   On: 05/05/2021 13:25       Subjective: More awake and alert, smiled answered questions, significant improvement  Objective: Vitals:   05/09/21 2102 05/09/21 2313 05/10/21 0346 05/10/21 0351  BP: 103/64 (!) 102/57 110/65   Pulse: (!) 49 (!) 50 (!) 49   Resp: 18 18 18    Temp: 98 F (36.7 C) 97.6 F (36.4 C) 98.4 F (36.9 C)   TempSrc: Oral Oral Oral   SpO2: 98% 99% 99%   Weight:    97.6 kg  Height:        Intake/Output Summary (Last 24 hours) at 05/10/2021 1111 Last data filed at 05/10/2021 1101 Gross per 24 hour  Intake --  Output 1875 ml  Net -1875 ml   Filed Weights   05/08/21 1700 05/09/21 0519 05/10/21 0351  Weight: 98.6 kg 99.6 kg  97.6 kg    Examination:  General exam: Appears calm and comfortable  Respiratory system: Clear to auscultation. Respiratory effort normal. Cardiovascular system: S1 & S2 heard, RRR. No JVD, murmurs, rubs, gallops or clicks. No pedal edema. Gastrointestinal system: Abdomen is nondistended, soft and nontender. No organomegaly or masses felt. Normal bowel sounds heard. Central nervous system: much more alert, 4/5 rue, . Extremities: WWP, n/v intact Skin: No rashes, lesions or ulcers Psychiatry: Judgement and insight appear normal. Mood & affect improved.     Data Reviewed: I have personally reviewed following labs and imaging studies  CBC: Recent Labs  Lab 05/04/21 0730 05/05/21 0223 05/10/21 0559  WBC 8.4 7.0 9.0  NEUTROABS  --   --  6.4  HGB 14.8 15.1* 13.7  HCT 45.4 46.7* 42.8  MCV 85.8 87.3 87.7  PLT 186 148* 130*   Basic Metabolic Panel: Recent Labs  Lab 05/04/21 0730 05/05/21 0223 05/07/21 0914 05/08/21 0325 05/10/21 0337  NA 144 144 144 142 139  K 3.9 4.2 3.7 4.0 4.7  CL 113* 114* 116* 113* 113*  CO2 19* 20* 22 24 23   GLUCOSE 134* 140* 143* 142* 87  BUN 25* 23* 21* 20 20  CREATININE 1.56* 1.23* 1.04* 1.00 0.93  CALCIUM 9.1 8.9 8.5* 8.8* 7.8*   GFR: Estimated Creatinine Clearance: 115.8 mL/min (by C-G formula based on SCr of 0.93 mg/dL). Liver Function Tests: Recent Labs  Lab 05/04/21 0730 05/05/21 0223 05/10/21 0337  AST 21 18 33  ALT 30 27 28   ALKPHOS 46 44 20*  BILITOT 1.5* 1.3* 1.2  PROT 6.7 6.8 4.4*  ALBUMIN 3.5  3.5 3.2*   No results for input(s): LIPASE, AMYLASE in the last 168 hours. No results for input(s): AMMONIA in the last 168 hours. Coagulation Profile: No results for input(s): INR, PROTIME in the last 168 hours. Cardiac Enzymes: No results for input(s): CKTOTAL, CKMB, CKMBINDEX, TROPONINI in the last 168 hours. BNP (last 3 results) No results for input(s): PROBNP in the last 8760 hours. HbA1C: No results for input(s): HGBA1C in  the last 72 hours. CBG: Recent Labs  Lab 05/04/21 1950  GLUCAP 134*   Lipid Profile: No results for input(s): CHOL, HDL, LDLCALC, TRIG, CHOLHDL, LDLDIRECT in the last 72 hours. Thyroid Function Tests: No results for input(s): TSH, T4TOTAL, FREET4, T3FREE, THYROIDAB in the last 72 hours. Anemia Panel: No results for input(s): VITAMINB12, FOLATE, FERRITIN, TIBC, IRON, RETICCTPCT in the last 72 hours. Sepsis Labs: No results for input(s): PROCALCITON, LATICACIDVEN in the last 168 hours.  Recent Results (from the past 240 hour(s))  Culture, Urine     Status: Abnormal   Collection Time: 05/07/21 11:31 PM   Specimen: Urine, Random  Result Value Ref Range Status   Specimen Description URINE, RANDOM  Final   Special Requests   Final    NONE Performed at [facility], [address], [place], [place] [address]    Culture (A)  Final    >=100,000 COLONIES/mL ESCHERICHIA COLI Confirmed Extended Spectrum Beta-Lactamase Producer (ESBL).  In bloodstream infections from ESBL organisms, carbapenems are preferred over piperacillin/tazobactam. They are shown to have a lower risk of mortality.    Report Status 05/10/2021 FINAL  Final   Organism ID, Bacteria ESCHERICHIA COLI (A)  Final      Susceptibility   Escherichia coli - MIC*    AMPICILLIN >=32 RESISTANT Resistant     CEFAZOLIN >=64 RESISTANT Resistant     CEFEPIME 2 SENSITIVE Sensitive     CEFTRIAXONE >=64 RESISTANT Resistant     CIPROFLOXACIN 0.5 SENSITIVE Sensitive     GENTAMICIN >=16 RESISTANT Resistant     IMIPENEM <=0.25 SENSITIVE Sensitive     NITROFURANTOIN <=16 SENSITIVE Sensitive     TRIMETH/SULFA >=320 RESISTANT Resistant     AMPICILLIN/SULBACTAM >=32 RESISTANT Resistant     PIP/TAZO <=4 SENSITIVE Sensitive     * >=100,000 COLONIES/mL ESCHERICHIA COLI  CSF culture w Gram Stain     Status: None (Preliminary result)   Collection Time: 05/08/21  9:21 AM   Specimen: PATH Cytology CSF; Cerebrospinal Fluid  Result Value  Ref Range Status   Specimen Description CSF  Final   Special Requests NONE  Final   Gram Stain CYTOSPIN SMEAR NO WBC SEEN NO ORGANISMS SEEN   Final   Culture   Final    NO GROWTH 2 DAYS Performed at [facility], [address] [place], [place] [address]    Report Status PENDING  Incomplete  Anaerobic culture w Gram Stain     Status: None (Preliminary result)   Collection Time: 05/08/21  9:21 AM   Specimen: PATH Cytology CSF; Cerebrospinal Fluid  Result Value Ref Range Status   Specimen Description CSF  Final   Special Requests NONE  Final   Gram Stain   Final    WBC PRESENT, PREDOMINANTLY MONONUCLEAR NO ORGANISMS SEEN CYTOSPIN SMEAR Performed at [facility], 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 Catheter needed for plasmapheresis. EXAM: FLUOROSCOPIC AND ULTRASOUND GUIDED PLACEMENT OF A NON-TUNNELED DIALYSIS CATHETER Physician: Rachelle Hora. Henn, MD MEDICATIONS: 1% lidocaine ANESTHESIA/SEDATION: None FLUOROSCOPY TIME:  Fluoroscopy Time: 12 seconds, 7 mGy COMPLICATIONS: None immediate. PROCEDURE: Informed consent was obtained for catheter placement. The patient was placed supine on the interventional table. Ultrasound confirmed a patent right internal jugular vein. Ultrasound images were obtained for documentation. The right neck was prepped and draped in a sterile fashion. The right neck was anesthetized with 1% lidocaine. Maximal barrier sterile technique was utilized including caps, mask, sterile gowns, sterile gloves, sterile drape, hand hygiene and skin antiseptic. A small incision was made with #11 blade scalpel. A 21 gauge needle directed into the right internal jugular vein with ultrasound guidance. A micropuncture dilator set was placed. A 15 cm Trialysis  was selected. The catheter was advanced over a wire and positioned at the superior cavoatrial junction. Fluoroscopic images were obtained for documentation. Both dialysis lumens were found to aspirate and flush well. The proper amount of heparin was flushed in both lumens. The central venous lumen was flushed with normal saline. Catheter was sutured to skin. FINDINGS: Catheter tip at the superior cavoatrial junction. IMPRESSION: Successful placement of a right jugular non-tunneled dialysis/pheresis catheter using ultrasound and fluoroscopic guidance. Electronically Signed   By: Richarda Overlie M.D.   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 Catheter needed for plasmapheresis.   EXAM: FLUOROSCOPIC AND ULTRASOUND GUIDED PLACEMENT OF A NON-TUNNELED DIALYSIS CATHETER   Physician: Rachelle Hora. Henn, MD   MEDICATIONS: 1% lidocaine   ANESTHESIA/SEDATION: None   FLUOROSCOPY TIME:  Fluoroscopy Time: 12 seconds, 7 mGy   COMPLICATIONS: None immediate.   PROCEDURE: Informed consent was obtained for catheter placement. The patient was placed supine on the interventional table. Ultrasound confirmed a patent right internal jugular vein. Ultrasound images were obtained for documentation. The right neck was prepped and draped in a sterile fashion. The right neck was anesthetized with 1% lidocaine. Maximal barrier sterile technique was utilized including caps, mask, sterile gowns, sterile gloves, sterile drape, hand hygiene and skin antiseptic. A small incision was made with #11 blade scalpel. A 21 gauge needle directed into the right internal jugular vein with ultrasound guidance. A micropuncture dilator set was placed. A 15 cm Trialysis was selected. The catheter was advanced over a wire and positioned at the superior cavoatrial junction. Fluoroscopic images were obtained for documentation. Both dialysis lumens were found to aspirate and flush well. The proper  amount of heparin was flushed in both lumens. The central venous lumen was flushed with normal saline. Catheter was sutured to skin.   FINDINGS: Catheter tip at the superior cavoatrial junction.   IMPRESSION: Successful placement of a right jugular non-tunneled dialysis/pheresis catheter using ultrasound and fluoroscopic guidance.     Electronically Signed   By: Richarda Overlie M.D.   On: 05/08/2021 16:58         Scheduled Meds:  amoxicillin  1,000 mg Oral Q12H   Chlorhexidine Gluconate Cloth  6 each Topical Daily   clarithromycin  500 mg Oral Q12H   docusate sodium  100 mg Oral BID   levothyroxine  50 mcg Intravenous Daily   lidocaine (PF)  5 mL Intradermal Once   metoprolol succinate  25 mg Oral Daily   pantoprazole  40 mg Oral BID   Continuous Infusions:  citrate dextrose     sodium chloride     thiamine injection 250 mg (05/09/21 2257)     LOS: 7 days    Time spent: 44    Burke Keels, MD [facility]  If 7PM-7AM, please contact night-coverage  05/10/2021, 11:11 AM

## 2021-05-11 LAB — CBC WITH DIFFERENTIAL/PLATELET
Abs Immature Granulocytes: 0.43 10*3/uL — ABNORMAL HIGH (ref 0.00–0.07)
Basophils Absolute: 0 10*3/uL (ref 0.0–0.1)
Basophils Relative: 0 %
Eosinophils Absolute: 0 10*3/uL (ref 0.0–0.5)
Eosinophils Relative: 0 %
HCT: 45.7 % (ref 36.0–46.0)
Hemoglobin: 15.2 g/dL — ABNORMAL HIGH (ref 12.0–15.0)
Immature Granulocytes: 3 %
Lymphocytes Relative: 6 %
Lymphs Abs: 0.9 10*3/uL (ref 0.7–4.0)
MCH: 28.4 pg (ref 26.0–34.0)
MCHC: 33.3 g/dL (ref 30.0–36.0)
MCV: 85.3 fL (ref 80.0–100.0)
Monocytes Absolute: 0.6 10*3/uL (ref 0.1–1.0)
Monocytes Relative: 4 %
Neutro Abs: 13.3 10*3/uL — ABNORMAL HIGH (ref 1.7–7.7)
Neutrophils Relative %: 87 %
Platelets: 205 10*3/uL (ref 150–400)
RBC: 5.36 MIL/uL — ABNORMAL HIGH (ref 3.87–5.11)
RDW: 18.8 % — ABNORMAL HIGH (ref 11.5–15.5)
WBC: 15.2 10*3/uL — ABNORMAL HIGH (ref 4.0–10.5)
nRBC: 0.1 % (ref 0.0–0.2)

## 2021-05-11 LAB — BASIC METABOLIC PANEL
Anion gap: 8 (ref 5–15)
BUN: 16 mg/dL (ref 6–20)
CO2: 24 mmol/L (ref 22–32)
Calcium: 8.9 mg/dL (ref 8.9–10.3)
Chloride: 106 mmol/L (ref 98–111)
Creatinine, Ser: 1.02 mg/dL — ABNORMAL HIGH (ref 0.44–1.00)
GFR, Estimated: >60 mL/min (ref >60)
Glucose, Bld: 116 mg/dL — ABNORMAL HIGH (ref 70–99)
Potassium: 3.8 mmol/L (ref 3.5–5.1)
Sodium: 138 mmol/L (ref 135–145)

## 2021-05-11 LAB — CSF CULTURE W GRAM STAIN: Culture: NO GROWTH

## 2021-05-11 NOTE — Progress Notes (Signed)
  Speech Language Pathology Treatment: Dysphagia  Patient Details Name: Diane Todd MRN: [id number] DOB: [birth year] Today's Date: 05/11/2021 Time: 3267-1245 SLP Time Calculation (min) (ACUTE ONLY): 20 min  Assessment / Plan / Recommendation Clinical Impression  Pt continues to present with functional swallowing as assessed clinically, but also exhibits fear and anxiety with swallowing.  Pt was lethargic, but able to rous.  Sister was present.  Pt had midday meal tray.  She consumed 3 or 4 bites of gelatin before requesting a break.  She says "this is a lot for me" and "I'm scared."  She could not pinpoint a specific fear with swallowing, but endorsed that it was a little bit of everything.  Pt tolerated a couple of bites of applesauce and exhibited good oral clearance.  Pt also tried regular solid graham cracker, and stated that it was good.  She continued to eat approximately 1.5 crackers, which was the most enthusiastic she was with PO intake today.  There were no clinical s/s of aspiration and pt exhibited good oral clearance of regular solids.  Sister reports that pt has done well with Boost Breeze juice supplements as well.  Per MD note, cortrak in consideration for early next week if PO intake does not improve.  Although pt's swallowing appears WNL and safe for any texture, I am wary of advancing diet too quickly and provoking additional anxiety resulting in decreased PO intake. Pt is agreeable to trial of puree diet.  SLP to follow for diet tolerance and possible advancement with hope of avoiding need for alternate means of nutrition.  Recommend advancing diet to puree with thin liquids.     HPI HPI: Diane Todd is a 20 y.o. 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  Per review of chart patient has been to the [facility] ED multiple times over the past 2 months for  range of issues.  Over this time she has consistently endorsed chronic nausea and vomiting.  Has also had work-up for gastritis and found to have H. pylori.  Also noted to have baseline creatinine of around 1.5 with unclear chronicity.  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  MRI of the brain was showing hyperintense T2-weighted signal and mild contrast enhancement within the medial thalami and periaqueductal gray matter concering for demyelinating disease.      SLP Plan  Continue with current plan of care       Recommendations  Diet recommendations: Dysphagia 1 (puree);Thin liquid Liquids provided via: Straw;Cup Medication Administration:  (As tolerated) Supervision: Trained caregiver to feed patient;Staff to assist with self feeding Compensations: Slow rate;Small sips/bites Postural Changes and/or Swallow Maneuvers: Seated upright 90 degrees                Oral Care Recommendations: Oral care BID Follow up Recommendations: 24 hour supervision/assistance SLP Visit Diagnosis: Dysphagia, unspecified (R13.10) Plan: Continue with current plan of care       GO                Kerrie Pleasure , MA, CCC-SLP Acute Rehabilitation Services Office: [phone]; Pager (6/24): [phone] 05/11/2021, 3:02 PM

## 2021-05-11 NOTE — Progress Notes (Signed)
PROGRESS NOTE    Diane Todd  RXV:[id number] DOB: 2001/04/15 DOA: 05/03/2021 PCP: Pcp, No   Brief Narrative:  Diane Todd is an 20 y.o. female past medical history of essential hypertension hypothyroidism transferred from [facility] for concern after her MRI showed multiple brain abnormalities she has been having generalized weakness poor appetite and blurry vision.   Neurology was consulted and appreciate assistance, recommended serum for AQP4-IgG antibody and MOG-IgG antibody test which have been sent and are pending. She will need 5 days of high-dose steroids and 3 days of high-dose thiamine and then can be switched to orals. Physical therapy evaluated the patient recommended inpatient rehab. 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 Physical therapy evaluated the patient recommended inpatient rehab. Placed on IV fluids half-normal saline with D5, see below nutrition for further details.   Essential hypertension: Blood pressures well controlled, on metoprolol.   Acute Kidney injury on chronic kidney disease stage III: On IV fluids due to decreased oral intake. Basic metabolic panels  pending.   Hypothyroidism TSH of 0.3.   History of H. pylori: Treatment initated today 6/24 Amoxicillin 1g bid, clarithromycin 500mg  bid, ppi bid all for 14 days, day 2/14   Nutrition: Advancing diet, prob cortrak Monday if po intake not sufficient.  DVT prophylaxis: SCD/Compression stockings  Code Status: full    Code Status Orders  (From admission, onward)           Start     Ordered   05/04/21 0802  Full code  Continuous        05/04/21 0801           Code Status History     Date Active Date Inactive Code Status Order ID Comments User Context   05/04/2021 0309 05/04/2021 0801 Full Code 05/06/2021  [id number], MD Inpatient      Family Communication: sister at bedside  Disposition Plan:   CIR vs SNF Consults called:  NEURO Admission status: Inpatient Status is: Inpatient   Remains inpatient appropriate because:Hemodynamically unstable   Dispo: The patient is from: Home              Anticipated d/c is to: Home              Patient currently is not medically stable to d/c.              Difficult to place patient No  Consultants:  AS ABOVE  Procedures:  MR BRAIN W WO CONTRAST  Result Date: 05/04/2021 CLINICAL DATA:  Weakness and blurry vision. EXAM: MRI HEAD WITH AND WITHOUT CONTRAST MRI CERVICAL AND THORACIC SPINE WITHOUT AND WITH CONTRAST TECHNIQUE: Multiplanar and multiecho pulse sequences of the head and cervical spine, to include the craniocervical junction and cervicothoracic junction, and the thoracic spine,  were obtained without and with intravenous contrast. CONTRAST:  10mL GADAVIST GADOBUTROL 1 MMOL/ML IV SOLN COMPARISON:  None. FINDINGS: MRI BRAIN FINDINGS Brain: No acute infarct, mass effect or extra-axial collection. No acute or chronic hemorrhage. There is hyperintense T2-weighted signal within the medial thalami and periaqueductal gray matter. There is mild contrast enhancement of the periaqueductal gray matter but no other enhancing lesion.  The midline structures are normal. Vascular: Major flow voids are preserved. Skull and upper cervical spine: Normal calvarium and skull base. Visualized upper cervical spine and soft tissues are normal. Sinuses/Orbits:No paranasal sinus fluid levels or advanced mucosal thickening. No mastoid or middle ear effusion. Normal orbits. MRI CERVICAL SPINE FINDINGS Alignment: Physiologic. Vertebrae: No fracture, evidence of discitis, or bone lesion. Cord: Normal signal and morphology. No abnormal contrast enhancement. Posterior Fossa, vertebral arteries, paraspinal tissues: Negative. Disc levels: No spinal canal or neural foraminal stenosis. MRI THORACIC SPINE FINDINGS Alignment:  Physiologic. Vertebrae: No fracture, evidence of discitis, or bone lesion. Cord: Normal signal and morphology. No abnormal contrast enhancement Paraspinal and other soft tissues: Negative. Disc levels: T7-8: Small right subarticular disc protrusion without stenosis. T8-9: Small left subarticular disc protrusion without stenosis. The other thoracic disc levels are normal. IMPRESSION: 1. Hyperintense T2-weighted signal and mild contrast enhancement within the medial thalami and periaqueductal gray matter. Demyelinating disease remains a primary consideration. MRI of the orbits with and without contrast might be helpful for better characterization of the optic nerves. CSF sampling should also be considered. 2. No demyelinating lesions of the cervical or thoracic spinal cord. Electronically Signed   By: Deatra RobinsonKevin  Herman M.D.   On: 05/04/2021 02:42   MR CERVICAL SPINE W WO CONTRAST  Result Date: 05/04/2021 CLINICAL DATA:  Weakness and blurry vision. EXAM: MRI HEAD WITH AND WITHOUT CONTRAST MRI CERVICAL AND THORACIC SPINE WITHOUT AND WITH CONTRAST TECHNIQUE: Multiplanar and multiecho pulse sequences of the head and cervical spine, to include the craniocervical junction and cervicothoracic junction, and the thoracic spine, were obtained without and with  intravenous contrast. CONTRAST:  10mL GADAVIST GADOBUTROL 1 MMOL/ML IV SOLN COMPARISON:  None. FINDINGS: MRI BRAIN FINDINGS Brain: No acute infarct, mass effect or extra-axial collection. No acute or chronic hemorrhage. There is hyperintense T2-weighted signal within the medial thalami and periaqueductal gray matter. There is mild contrast enhancement of the periaqueductal gray matter but no other enhancing lesion. The midline structures are normal. Vascular: Major flow voids are preserved. Skull and upper cervical spine: Normal calvarium and skull base. Visualized upper cervical spine and soft tissues are normal. Sinuses/Orbits:No paranasal sinus fluid levels or advanced mucosal thickening. No mastoid or middle ear effusion. Normal orbits. MRI CERVICAL SPINE FINDINGS Alignment: Physiologic. Vertebrae: No fracture, evidence of discitis, or bone lesion. Cord: Normal signal and morphology. No abnormal contrast enhancement. Posterior Fossa, vertebral arteries, paraspinal tissues: Negative. Disc levels: No spinal canal or neural foraminal stenosis. MRI THORACIC SPINE FINDINGS Alignment:  Physiologic. Vertebrae: No fracture, evidence of discitis, or bone lesion. Cord: Normal signal and morphology. No abnormal contrast enhancement Paraspinal and other soft tissues: Negative. Disc levels: T7-8: Small right subarticular disc protrusion without stenosis. T8-9: Small left subarticular disc protrusion without stenosis. The other thoracic disc levels are normal. IMPRESSION: 1. Hyperintense T2-weighted signal and mild contrast enhancement within the medial thalami and periaqueductal gray matter. Demyelinating disease remains a primary consideration. MRI of the orbits with and without contrast might be helpful for better characterization of the optic nerves. CSF sampling should also be considered. 2. No demyelinating lesions  of the cervical or thoracic spinal cord. Electronically Signed   By: Deatra Robinson M.D.   On: 05/04/2021  02:42   MR THORACIC SPINE W WO CONTRAST  Result Date: 05/04/2021 CLINICAL DATA:  Weakness and blurry vision. EXAM: MRI HEAD WITH AND WITHOUT CONTRAST MRI CERVICAL AND THORACIC SPINE WITHOUT AND WITH CONTRAST TECHNIQUE: Multiplanar and multiecho pulse sequences of the head and cervical spine, to include the craniocervical junction and cervicothoracic junction, and the thoracic spine, were obtained without and with intravenous contrast. CONTRAST:  10mL GADAVIST GADOBUTROL 1 MMOL/ML IV SOLN COMPARISON:  None. FINDINGS: MRI BRAIN FINDINGS Brain: No acute infarct, mass effect or extra-axial collection. No acute or chronic hemorrhage. There is hyperintense T2-weighted signal within the medial thalami and periaqueductal gray matter. There is mild contrast enhancement of the periaqueductal gray matter but no other enhancing lesion. The midline structures are normal. Vascular: Major flow voids are preserved. Skull and upper cervical spine: Normal calvarium and skull base. Visualized upper cervical spine and soft tissues are normal. Sinuses/Orbits:No paranasal sinus fluid levels or advanced mucosal thickening. No mastoid or middle ear effusion. Normal orbits. MRI CERVICAL SPINE FINDINGS Alignment: Physiologic. Vertebrae: No fracture, evidence of discitis, or bone lesion. Cord: Normal signal and morphology. No abnormal contrast enhancement. Posterior Fossa, vertebral arteries, paraspinal tissues: Negative. Disc levels: No spinal canal or neural foraminal stenosis. MRI THORACIC SPINE FINDINGS Alignment:  Physiologic. Vertebrae: No fracture, evidence of discitis, or bone lesion. Cord: Normal signal and morphology. No abnormal contrast enhancement Paraspinal and other soft tissues: Negative. Disc levels: T7-8: Small right subarticular disc protrusion without stenosis. T8-9: Small left subarticular disc protrusion without stenosis. The other thoracic disc levels are normal. IMPRESSION: 1. Hyperintense T2-weighted signal and  mild contrast enhancement within the medial thalami and periaqueductal gray matter. Demyelinating disease remains a primary consideration. MRI of the orbits with and without contrast might be helpful for better characterization of the optic nerves. CSF sampling should also be considered. 2. No demyelinating lesions of the cervical or thoracic spinal cord. Electronically Signed   By: Deatra Robinson M.D.   On: 05/04/2021 02:42   US RENAL  Result Date: 05/04/2021 CLINICAL DATA:  Chronic renal disease. EXAM: RENAL / URINARY TRACT ULTRASOUND COMPLETE COMPARISON:  CT abdomen and pelvis March 14, 2021. FINDINGS: Right Kidney: Renal measurements: 10.2 x 4.2 x 5.0 cm = volume: 112.0 mL. Mild renal cortical thinning. No hydronephrosis. Left Kidney: Renal measurements: May 0.4 x 4.5 x 3.9 cm = volume: 76.7 mL. Mild renal cortical thinning. No hydronephrosis. Bladder: Decompressed with Foley catheter. Other: None. IMPRESSION: No hydronephrosis. Electronically Signed   By: Annia Belt M.D.   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 Catheter needed for plasmapheresis. EXAM: FLUOROSCOPIC AND ULTRASOUND GUIDED PLACEMENT OF A NON-TUNNELED DIALYSIS CATHETER Physician: Rachelle Hora. Henn, MD MEDICATIONS: 1% lidocaine ANESTHESIA/SEDATION: None FLUOROSCOPY TIME:  Fluoroscopy Time: 12 seconds, 7 mGy COMPLICATIONS: None immediate. PROCEDURE: Informed consent was obtained for catheter placement. The patient was placed supine on the interventional table. Ultrasound confirmed a patent right internal jugular vein. Ultrasound images were obtained for documentation. The right neck was prepped and draped in a sterile fashion. The right neck was anesthetized with 1% lidocaine. Maximal barrier sterile technique was utilized including caps, mask, sterile gowns, sterile gloves, sterile drape, hand hygiene and skin antiseptic. A small incision was made  with #11 blade scalpel. A 21 gauge needle directed into the  right internal jugular vein with ultrasound guidance. A micropuncture dilator set was placed. A 15 cm Trialysis was selected. The catheter was advanced over a wire and positioned at the superior cavoatrial junction. Fluoroscopic images were obtained for documentation. Both dialysis lumens were found to aspirate and flush well. The proper amount of heparin was flushed in both lumens. The central venous lumen was flushed with normal saline. Catheter was sutured to skin. FINDINGS: Catheter tip at the superior cavoatrial junction. IMPRESSION: Successful placement of a right jugular non-tunneled dialysis/pheresis catheter using ultrasound and fluoroscopic guidance. Electronically Signed   By: Richarda Overlie M.D.   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 Catheter needed for plasmapheresis.   EXAM: FLUOROSCOPIC AND ULTRASOUND GUIDED PLACEMENT OF A NON-TUNNELED DIALYSIS CATHETER   Physician: Rachelle Hora. Henn, MD   MEDICATIONS: 1% lidocaine   ANESTHESIA/SEDATION: None   FLUOROSCOPY TIME:  Fluoroscopy Time: 12 seconds, 7 mGy   COMPLICATIONS: None immediate.   PROCEDURE: Informed consent was obtained for catheter placement. The patient was placed supine on the interventional table. Ultrasound confirmed a patent right internal jugular vein. Ultrasound images were obtained for documentation. The right neck was prepped and draped in a sterile fashion. The right neck was anesthetized with 1% lidocaine. Maximal barrier sterile technique was utilized including caps, mask, sterile gowns, sterile gloves, sterile drape, hand hygiene and skin antiseptic. A small incision was made with #11 blade scalpel. A 21 gauge needle directed into the right internal jugular vein with ultrasound guidance. A micropuncture dilator set was placed. A 15 cm Trialysis was selected. The catheter was advanced over a wire and  positioned at the superior cavoatrial junction. Fluoroscopic images were obtained for documentation. Both dialysis lumens were found to aspirate and flush well. The proper amount of heparin was flushed in both lumens. The central venous lumen was flushed with normal saline. Catheter was sutured to skin.   FINDINGS: Catheter tip at the superior cavoatrial junction.   IMPRESSION: Successful placement of a right jugular non-tunneled dialysis/pheresis catheter using ultrasound and fluoroscopic guidance.     Electronically Signed   By: Richarda Overlie M.D.   On: 05/08/2021 16:58    DG FL GUIDED LUMBAR PUNCTURE  Result Date: 05/08/2021 CLINICAL DATA:  Optic neuritis, altered mental status EXAM: DIAGNOSTIC LUMBAR PUNCTURE UNDER FLUOROSCOPIC GUIDANCE COMPARISON:  None FLUOROSCOPY TIME:  Fluoroscopy Time:  12 seconds Radiation Exposure Index (if provided by the fluoroscopic device): 1.5 mGy Number of Acquired Spot Images: 0 PROCEDURE: Informed consent was obtained from the patient prior to the procedure, including potential complications of headache, allergy, and pain. With the patient prone, the lower back was prepped with Betadine. 1% Lidocaine was used for local anesthesia. Lumbar puncture was performed at the L4-L5 level using a 20 gauge needle with return of clear CSF with an opening pressure of 17 cm water. Approximately 13 ml of CSF were obtained for laboratory studies. The patient tolerated the procedure well and there were no apparent complications. IMPRESSION: Successful lumbar puncture. Approximately 13 mL of clear CSF were obtained and sent to the laboratory for analysis. There were no immediate complications. Electronically Signed   By: Caprice Renshaw   On: 05/08/2021 09:48   MR ORBITS W WO CONTRAST  Result Date: 05/05/2021 CLINICAL DATA:  Optic neuritis suspected. EXAM: MRI OF THE ORBITS WITHOUT AND WITH CONTRAST TECHNIQUE: Multiplanar, multi-echo pulse sequences of the orbits and surrounding structures were  acquired including  fat saturation techniques, before and after intravenous contrast administration. CONTRAST:  10mL GADAVIST GADOBUTROL 1 MMOL/ML IV SOLN COMPARISON:  Abnormal MRI of the brain. FINDINGS: Orbits: Globes are within normal limits bilaterally. The optic nerves are unremarkable. Chiasm is within normal limits. Abnormal signal or enhancement is present. Extraocular muscles are within normal limits. Visualized sinuses: Polyp or mucous retention cyst is noted in the left maxillary sinus. The paranasal sinuses and mastoid air cells are otherwise clear. Soft tissues: Periorbital soft tissues and visualized facial soft tissues are within normal limits. 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 There is subtle enhancement along the periaqueductal gray matter and medial thalami. IMPRESSION: 1. Normal MRI appearance of the orbits. 2. Subtle enhancement along the periaqueductal gray matter and medial thalami bilaterally. This corresponds to the abnormal signal changes on the MRI from yesterday. In addition to a demyelinating process, or Wernicke encephalopathy (thiamine deficiency encephalopathy) is also considered. Electronically Signed   By: Marin Roberts M.D.   On: 05/05/2021 13:25    ABX: FOR HYPYLORI TX DAY 2/14   Subjective: LITTLE SLEEPIER TODAY, DID AWAKEN WITH SISTER THIS AM  Objective: Vitals:   05/11/21 0425 05/11/21 0747 05/11/21 1109 05/11/21 1126  BP: 104/65 (!) 94/55  120/83  Pulse: 76 63 72 70  Resp: Temp: 98.5 F (36.9 C) 98.2 F (36.8 C)  98.2 F (36.8 C)  TempSrc: Oral Oral  Oral  SpO2: 99% 100%  100%  Weight:      Height:        Intake/Output Summary (Last 24 hours) at 05/11/2021 1359 Last data filed at 05/11/2021 0515 Gross per 24 hour  Intake 250 ml  Output 1600 ml  Net -1350 ml   Filed Weights   05/09/21 0519 05/10/21 0351 05/10/21 1415  Weight: 99.6 kg 97.6 kg 96.4 kg    Examination:  General  exam: SLEEPING BUT AWAKENS Respiratory system: Clear to auscultation. Respiratory effort normal. Cardiovascular system: S1 & S2 heard, RRR. No JVD, murmurs, rubs, gallops or clicks. No pedal edema. Gastrointestinal system: Abdomen is nondistended, soft and nontender. No organomegaly or masses felt. Normal bowel sounds heard. Central nervous system: Alert and oriented. No focal neurological deficits. Extremities: wwp, nv in Skin: No rashes, lesions or ulcers Psychiatry: no acute decomp    Data Reviewed: I have personally reviewed following labs and imaging studies  CBC: Recent Labs  Lab 05/05/21 0223 05/10/21 0559 05/11/21 0437  WBC 7.0 9.0 15.2*  NEUTROABS  --  6.4 13.3*  HGB 15.1* 13.7 15.2*  HCT 46.7* 42.8 45.7  MCV 87.3 87.7 85.3  PLT 148* 130* 205   Basic Metabolic Panel: Recent Labs  Lab 05/05/21 0223 05/07/21 0914 05/08/21 0325 05/10/21 0337 05/11/21 0437  NA 144 144 142 139 138  K 4.2 3.7 4.0 4.7 3.8  CL 114* 116* 113* 113* 106  CO2 20* GLUCOSE 140* 143* 142* 87 116*  BUN 23* 21* CREATININE 1.23* 1.04* 1.00 0.93 1.02*  CALCIUM 8.9 8.5* 8.8* 7.8* 8.9   GFR: Estimated Creatinine Clearance: 104.9 mL/min (A) (by C-G formula based on SCr of 1.02 mg/dL (H)). Liver Function Tests: Recent Labs  Lab 05/05/21 0223 05/10/21 0337  AST 18 33  ALT 27 28  ALKPHOS 44 20*  BILITOT 1.3* 1.2  PROT 6.8 4.4*  ALBUMIN 3.5 3.2*   No results for input(s): LIPASE, AMYLASE in the last 168 hours. No  results for input(s): AMMONIA in the last 168 hours. Coagulation Profile: No results for input(s): INR, PROTIME in the last 168 hours. Cardiac Enzymes: No results for input(s): CKTOTAL, CKMB, CKMBINDEX, TROPONINI in the last 168 hours. BNP (last 3 results) No results for input(s): PROBNP in the last 8760 hours. HbA1C: No results for input(s): HGBA1C in the last 72 hours. CBG: Recent Labs  Lab 05/04/21 1950  GLUCAP 134*   Lipid Profile: No  results for input(s): CHOL, HDL, LDLCALC, TRIG, CHOLHDL, LDLDIRECT in the last 72 hours. Thyroid Function Tests: No results for input(s): TSH, T4TOTAL, FREET4, T3FREE, THYROIDAB in the last 72 hours. Anemia Panel: No results for input(s): VITAMINB12, FOLATE, FERRITIN, TIBC, IRON, RETICCTPCT in the last 72 hours. Sepsis Labs: No results for input(s): PROCALCITON, LATICACIDVEN in the last 168 hours.  Recent Results (from the past 240 hour(s))  Culture, Urine     Status: Abnormal   Collection Time: 05/07/21 11:31 PM   Specimen: Urine, Random  Result Value Ref Range Status   Specimen Description URINE, RANDOM  Final   Special Requests   Final    NONE Performed at [facility], [address], [place], [place] [address]    Culture (A)  Final    >=100,000 COLONIES/mL ESCHERICHIA COLI Confirmed Extended Spectrum Beta-Lactamase Producer (ESBL).  In bloodstream infections from ESBL organisms, carbapenems are preferred over piperacillin/tazobactam. They are shown to have a lower risk of mortality.    Report Status 05/10/2021 FINAL  Final   Organism ID, Bacteria ESCHERICHIA COLI (A)  Final      Susceptibility   Escherichia coli - MIC*    AMPICILLIN >=32 RESISTANT Resistant     CEFAZOLIN >=64 RESISTANT Resistant     CEFEPIME 2 SENSITIVE Sensitive     CEFTRIAXONE >=64 RESISTANT Resistant     CIPROFLOXACIN 0.5 SENSITIVE Sensitive     GENTAMICIN >=16 RESISTANT Resistant     IMIPENEM <=0.25 SENSITIVE Sensitive     NITROFURANTOIN <=16 SENSITIVE Sensitive     TRIMETH/SULFA >=320 RESISTANT Resistant     AMPICILLIN/SULBACTAM >=32 RESISTANT Resistant     PIP/TAZO <=4 SENSITIVE Sensitive     * >=100,000 COLONIES/mL ESCHERICHIA COLI  CSF culture w Gram Stain     Status: None (Preliminary result)   Collection Time: 05/08/21  9:21 AM   Specimen: PATH Cytology CSF; Cerebrospinal Fluid  Result Value Ref Range Status   Specimen Description CSF  Final   Special Requests NONE  Final   Gram Stain  CYTOSPIN SMEAR NO WBC SEEN NO ORGANISMS SEEN   Final   Culture   Final    NO GROWTH 3 DAYS Performed at [facility], [address], [place], [place] [address]    Report Status PENDING  Incomplete  Fungus Culture With Stain     Status: None (Preliminary result)   Collection Time: 05/08/21  9:21 AM   Specimen: PATH Cytology CSF; Cerebrospinal Fluid  Result Value Ref Range Status   Fungus Stain Final report  Final    Comment: (NOTE) Performed At: [facility] [address] [place], [place] [id number] Jolene Schimke MD AO:[id number]    Fungus (Mycology) Culture PENDING  Incomplete   Fungal Source CSF  Final    Comment: Performed at [facility], [address], [place], [place] [address]  Anaerobic culture w Gram Stain     Status: None (Preliminary result)   Collection Time: 05/08/21  9:21 AM   Specimen: PATH Cytology CSF; Cerebrospinal  Fluid  Result Value Ref Range Status   Specimen Description CSF  Final   Special Requests NONE  Final   Gram Stain   Final    WBC PRESENT, PREDOMINANTLY MONONUCLEAR NO ORGANISMS SEEN CYTOSPIN SMEAR Performed at [facility], [address], [place], [place] [address]    Culture   Final    NO ANAEROBES ISOLATED; CULTURE IN PROGRESS FOR 5 DAYS   Report Status PENDING  Incomplete  Fungus Culture Result     Status: None   Collection Time: 05/08/21  9:21 AM  Result Value Ref Range Status   Result 1 Comment  Final    Comment: (NOTE) KOH/Calcofluor preparation:  no fungus observed. Performed At: [facility] [address] [place], [place] [id number] Jolene Schimke MD ID:[id number]          Radiology Studies: No results found.      Scheduled Meds:  amoxicillin  1,000 mg Oral Q12H   Chlorhexidine Gluconate Cloth  6 each Topical Daily   clarithromycin  500 mg Oral Q12H   docusate sodium  100 mg Oral BID   feeding supplement  1 Container Oral TID BM   levothyroxine  50 mcg Intravenous Daily    lidocaine (PF)  5 mL Intradermal Once   metoprolol succinate  25 mg Oral Daily   multivitamin with minerals  1 tablet Oral Daily   pantoprazole  40 mg Oral BID   Continuous Infusions:  sodium chloride       LOS: 8 days    Time spent: 35 min intact    Burke Keels, MD [facility]  If 7PM-7AM, please contact night-coverage  05/11/2021, 1:59 PM

## 2021-05-12 NOTE — Progress Notes (Signed)
NEUROLOGY CONSULTATION PROGRESS NOTE   Date of service: May 12, 2021 Patient Name: Diane Todd MRN:  [id number] DOB:  2001/05/25  Brief HPI  Diane Todd is a 20 y.o. female with PMH significant for recent Hpylori infection, intractable nausea with poor po intake since April 2022, who initially presented to OSH on 6/16 with 2.5 weeks hx of blurred vision which progressed to ataxia. 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  MRI Brain and C spine with and w/o contrast with hyperintense T2-weighted signal and mild contrast enhancement within the medial thalami and  periaqueductal gray matter. MRI Orbits with no significant abnormality. 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  She did not have significant improvement with steroids and so was started on Plasma Exchange.  MOG Ab is negative.  AQP4 Ab is pending, IgG index is pending, OCB pending.   Interval Hx   Sister at bedside, ate PO pureed food yesterday per sister and tolerated it fine for the first time in a while. Also more awake and stayed awake for most of my evaluation today.  Vitals   Vitals:   05/11/21 2307 05/12/21 0304 05/12/21 0746 05/12/21 1210  BP: (!) 82/47 99/63 106/61 120/74  Pulse: 70 66 (!) 55 67  Resp: 16 18 18 18   Temp: 97.8 F (36.6 C) 98.8 F (37.1 C) 98.3 F (36.8 C) 98.5 F (36.9 C)  TempSrc: Oral Oral Oral Oral  SpO2: 98% 100% 100% 99%  Weight:      Height:         Body mass index is 33.29 kg/m.  Physical Exam   General: Laying comfortably in bed; in no acute distress. HENT: Normal oropharynx and mucosa. Normal external appearance of ears and nose. Neck: Supple, no pain or tenderness CV: No JVD. No peripheral edema. Pulmonary: Symmetric Chest rise. Normal respiratory effort. Abdomen: Soft to touch, non-tender. Ext: No cyanosis, edema, or  deformity Skin: No rash. Normal palpation of skin.  Musculoskeletal: Normal digits and nails by inspection. No clubbing.  Neurologic Examination  Mental status/Cognition: somnolent, oriented to self, slowed and soft speech.  Cranial nerves:   CN II Pupils equal and reactive to light, no VF deficits   CN III,IV,VI Dysconjugate eyes. Opthalmoplegia is mildly improved today with still pretty limited lateral gaze. Appears more like INO today.   CN V normal sensation in V1, V2, and V3 segments bilaterally   CN VII no asymmetry, no nasolabial fold flattening   CN VIII normal hearing to speech   CN IX & X normal palatal elevation, no uvular deviation   CN XI    CN XII midline tongue protrusion   Motor:  Muscle bulk: normal, tone normal Mvmt Root Nerve  Muscle Right Left Comments  SA C5/6 Ax Deltoid     EF C5/6 Mc Biceps 5 5   EE C6/7/8 Rad Triceps 5 5   WF C6/7 Med FCR     WE C7/8 PIN ECU     F Ab C8/T1 U ADM/FDI 4+ 4+   HF L1/2/3 Fem Illopsoas 4 4   KE L2/3/4 Fem Quad 4 4   DF L4/5 D Peron Tib Ant 5 5   PF S1/2 Tibial Grc/Sol 5 5    Sensation:  Light touch Intact throughout   Pin prick    Temperature    Vibration   Proprioception  Coordination/Complex Motor:  - Finger to Nose intact BL with mild ataxia. - Heel to shin unable to do. - Rapid alternating movement are slowed. - Gait: Deferred.  Labs   Basic Metabolic Panel:  Lab Results  Component Value Date   NA 138 05/11/2021   K 3.8 05/11/2021   CO2 24 05/11/2021   GLUCOSE 116 (H) 05/11/2021   BUN 16 05/11/2021   CREATININE 1.02 (H) 05/11/2021   CALCIUM 8.9 05/11/2021   GFRNONAA >60 05/11/2021   HbA1c:  Lab Results  Component Value Date   HGBA1C 5.8 (H) 05/04/2021   LDL: No results found for: [facility] Urine Drug Screen: No results found for: LABOPIA, COCAINSCRNUR, LABBENZ, AMPHETMU, THCU, LABBARB  Alcohol Level No results found for: ETH No results found for: PHENYTOIN, ZONISAMIDE, LAMOTRIGINE,  LEVETIRACETA No results found for: PHENYTOIN, PHENOBARB, VALPROATE, CBMZ  Imaging and Diagnostic studies   MRI Orbits with and without contrast: 1. Normal MRI appearance of the orbits. 2. Subtle enhancement along the periaqueductal gray matter and medial thalami bilaterally. This corresponds to the abnormal signal changes on the MRI from yesterday. In addition to a demyelinating process, or Wernicke encephalopathy (thiamine deficiency encephalopathy) is also considered.  MRI Brain with and without cotnrast(personally reviewed): 1. Hyperintense T2-weighted signal and mild contrast enhancement within the medial thalami and periaqueductal gray matter. Demyelinating disease remains a primary consideration. MRI of the orbits with and without contrast might be helpful for better characterization of the optic nerves. CSF sampling should also be considered. 2. No demyelinating lesions of the cervical or thoracic spinal cord.  MRI C and T spine with and without contrast: No demyelinating lesions of the cervical or thoracic spinal cord.  CSF studies: Glucose: 80 Protein: 45 WBC: 8 OCB: pending IgG index: pending.  Impression   Diane Todd is a 20 y.o. female with PMH significant for recent Hpylori infection, intractable nausea with poor po intake since April 2022, who initially presented to OSH on 6/16 with 2.5 weeks hx of blurred vision which progressed to ataxia. 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  Involvement of the medial thalami might explain decreased arousal and involvement of the periaqueductal gray might explain her nausea.  There is some improvement in her somnolence but still continues to have significant nausea with minimal to no improvement and still has significant ophthalmoplegia with blurred vision.  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  Recommendations  - Completed 5 days of IV Solumderol. - PLEX, session 3 of 5 tomorrow - AQP4 pending. - Thiamine levels are pending. Continue Thiamine. - We will continue to follow along.   Plan discussed with patient's sister at bedside and over phone.  _______________________________________________________________   Thank you for the opportunity to take part in the care of this patient. If you have any further questions, please contact the neurology consultation attending.  Signed,  Erick Blinks [facility] Pager Number [phone]

## 2021-05-12 NOTE — Progress Notes (Signed)
PROGRESS NOTE    Diane Todd  PXT:[id number] DOB: November 19, 2000 DOA: 05/03/2021 PCP: Pcp, No   Brief Narrative:  Diane Todd is an 20 y.o. female past medical history of essential hypertension hypothyroidism transferred from [facility] for concern after her MRI showed multiple brain abnormalities she has been having generalized weakness poor appetite and blurry vision.   Neurology was consulted and appreciate assistance, recommended serum for AQP4-IgG antibody and MOG-IgG antibody test which have been sent and are pending. She will need 5 days of high-dose steroids and 3 days of high-dose thiamine and then can be switched to orals. Physical therapy evaluated the patient recommended inpatient rehab. 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 Physical therapy evaluated the patient recommended inpatient rehab. Placed on IV fluids half-normal saline with D5, see below nutrition for further details.   Essential hypertension: Blood pressures well controlled, on metoprolol.   Acute Kidney injury on chronic kidney disease stage III: On IV fluids due to decreased oral intake. Basic metabolic panels  pending.   Hypothyroidism TSH of 0.3.   History of H. pylori: Treatment initated today 6/24 Amoxicillin 1g bid, clarithromycin 500mg  bid, ppi bid all for 14 days, day 3/14   Nutrition: Sister reports significant dietary intake saturday Advancing diet, poss cortrak Monday if po intake not sufficient.- although encouraging increase in PO intake satruday    DVT prophylaxis: SCD/Compression stockings  Code Status: full    Code Status Orders  (From admission, onward)           Start     Ordered   05/04/21 0802  Full code  Continuous        05/04/21 0801           Code Status History     Date Active Date Inactive Code Status Order ID Comments User Context   05/04/2021 0309 05/04/2021 0801 Full Code 05/06/2021  [id number], MD Inpatient      Family Communication: sister at bedside  Disposition Plan:   CIR vs SNF Consults called:  NEURO Admission status: Inpatient Status is: Inpatient   Remains inpatient appropriate because:Hemodynamically unstable   Dispo: The patient is from: Home              Anticipated d/c is to: Home              Patient currently is not medically stable to d/c.              Difficult to place patient No   Consultants:  AS ABOVE  Procedures:  MR BRAIN W WO CONTRAST  Result Date: 05/04/2021 CLINICAL DATA:  Weakness and blurry vision. EXAM: MRI HEAD WITH AND WITHOUT CONTRAST MRI CERVICAL AND THORACIC SPINE WITHOUT AND WITH CONTRAST TECHNIQUE: Multiplanar and multiecho pulse sequences of the  head and cervical spine, to include the craniocervical junction and cervicothoracic junction, and the thoracic spine, were obtained without and with intravenous contrast. CONTRAST:  10mL GADAVIST GADOBUTROL 1 MMOL/ML IV SOLN COMPARISON:  None. FINDINGS: MRI BRAIN FINDINGS Brain: No acute infarct, mass effect or extra-axial collection. No acute or chronic hemorrhage. There is hyperintense T2-weighted signal within the medial thalami and periaqueductal gray  matter. There is mild contrast enhancement of the periaqueductal gray matter but no other enhancing lesion. The midline structures are normal. Vascular: Major flow voids are preserved. Skull and upper cervical spine: Normal calvarium and skull base. Visualized upper cervical spine and soft tissues are normal. Sinuses/Orbits:No paranasal sinus fluid levels or advanced mucosal thickening. No mastoid or middle ear effusion. Normal orbits. MRI CERVICAL SPINE FINDINGS Alignment: Physiologic. Vertebrae: No fracture, evidence of discitis, or bone lesion. Cord: Normal signal and morphology. No abnormal contrast enhancement. Posterior Fossa, vertebral arteries, paraspinal tissues: Negative. Disc levels: No spinal canal or neural foraminal stenosis. MRI THORACIC SPINE FINDINGS Alignment:  Physiologic. Vertebrae: No fracture, evidence of discitis, or bone lesion. Cord: Normal signal and morphology. No abnormal contrast enhancement Paraspinal and other soft tissues: Negative. Disc levels: T7-8: Small right subarticular disc protrusion without stenosis. T8-9: Small left subarticular disc protrusion without stenosis. The other thoracic disc levels are normal. IMPRESSION: 1. Hyperintense T2-weighted signal and mild contrast enhancement within the medial thalami and periaqueductal gray matter. Demyelinating disease remains a primary consideration. MRI of the orbits with and without contrast might be helpful for better characterization of the optic nerves. CSF sampling should also be considered. 2. No demyelinating lesions of the cervical or thoracic spinal cord. Electronically Signed   By: Deatra Robinson M.D.   On: 05/04/2021 02:42   MR CERVICAL SPINE W WO CONTRAST  Result Date: 05/04/2021 CLINICAL DATA:  Weakness and blurry vision. EXAM: MRI HEAD WITH AND WITHOUT CONTRAST MRI CERVICAL AND THORACIC SPINE WITHOUT AND WITH CONTRAST TECHNIQUE: Multiplanar and multiecho pulse sequences of the head and cervical spine, to include the  craniocervical junction and cervicothoracic junction, and the thoracic spine, were obtained without and with intravenous contrast. CONTRAST:  10mL GADAVIST GADOBUTROL 1 MMOL/ML IV SOLN COMPARISON:  None. FINDINGS: MRI BRAIN FINDINGS Brain: No acute infarct, mass effect or extra-axial collection. No acute or chronic hemorrhage. There is hyperintense T2-weighted signal within the medial thalami and periaqueductal gray matter. There is mild contrast enhancement of the periaqueductal gray matter but no other enhancing lesion. The midline structures are normal. Vascular: Major flow voids are preserved. Skull and upper cervical spine: Normal calvarium and skull base. Visualized upper cervical spine and soft tissues are normal. Sinuses/Orbits:No paranasal sinus fluid levels or advanced mucosal thickening. No mastoid or middle ear effusion. Normal orbits. MRI CERVICAL SPINE FINDINGS Alignment: Physiologic. Vertebrae: No fracture, evidence of discitis, or bone lesion. Cord: Normal signal and morphology. No abnormal contrast enhancement. Posterior Fossa, vertebral arteries, paraspinal tissues: Negative. Disc levels: No spinal canal or neural foraminal stenosis. MRI THORACIC SPINE FINDINGS Alignment:  Physiologic. Vertebrae: No fracture, evidence of discitis, or bone lesion. Cord: Normal signal and morphology. No abnormal contrast enhancement Paraspinal and other soft tissues: Negative. Disc levels: T7-8: Small right subarticular disc protrusion without stenosis. T8-9: Small left subarticular disc protrusion without stenosis. The other thoracic disc levels are normal. IMPRESSION: 1. Hyperintense T2-weighted signal and mild contrast enhancement within the medial thalami and periaqueductal gray matter. Demyelinating disease remains a primary consideration. MRI of the orbits with and without contrast might be helpful for  better characterization of the optic nerves. CSF sampling should also be considered. 2. No demyelinating  lesions of the cervical or thoracic spinal cord. Electronically Signed   By: Deatra RobinsonKevin  Herman M.D.   On: 05/04/2021 02:42   MR THORACIC SPINE W WO CONTRAST  Result Date: 05/04/2021 CLINICAL DATA:  Weakness and blurry vision. EXAM: MRI HEAD WITH AND WITHOUT CONTRAST MRI CERVICAL AND THORACIC SPINE WITHOUT AND WITH CONTRAST TECHNIQUE: Multiplanar and multiecho pulse sequences of the head and cervical spine, to include the craniocervical junction and cervicothoracic junction, and the thoracic spine, were obtained without and with intravenous contrast. CONTRAST:  10mL GADAVIST GADOBUTROL 1 MMOL/ML IV SOLN COMPARISON:  None. FINDINGS: MRI BRAIN FINDINGS Brain: No acute infarct, mass effect or extra-axial collection. No acute or chronic hemorrhage. There is hyperintense T2-weighted signal within the medial thalami and periaqueductal gray matter. There is mild contrast enhancement of the periaqueductal gray matter but no other enhancing lesion. The midline structures are normal. Vascular: Major flow voids are preserved. Skull and upper cervical spine: Normal calvarium and skull base. Visualized upper cervical spine and soft tissues are normal. Sinuses/Orbits:No paranasal sinus fluid levels or advanced mucosal thickening. No mastoid or middle ear effusion. Normal orbits. MRI CERVICAL SPINE FINDINGS Alignment: Physiologic. Vertebrae: No fracture, evidence of discitis, or bone lesion. Cord: Normal signal and morphology. No abnormal contrast enhancement. Posterior Fossa, vertebral arteries, paraspinal tissues: Negative. Disc levels: No spinal canal or neural foraminal stenosis. MRI THORACIC SPINE FINDINGS Alignment:  Physiologic. Vertebrae: No fracture, evidence of discitis, or bone lesion. Cord: Normal signal and morphology. No abnormal contrast enhancement Paraspinal and other soft tissues: Negative. Disc levels: T7-8: Small right subarticular disc protrusion without stenosis. T8-9: Small left subarticular disc protrusion  without stenosis. The other thoracic disc levels are normal. IMPRESSION: 1. Hyperintense T2-weighted signal and mild contrast enhancement within the medial thalami and periaqueductal gray matter. Demyelinating disease remains a primary consideration. MRI of the orbits with and without contrast might be helpful for better characterization of the optic nerves. CSF sampling should also be considered. 2. No demyelinating lesions of the cervical or thoracic spinal cord. Electronically Signed   By: Deatra RobinsonKevin  Herman M.D.   On: 05/04/2021 02:42   US RENAL  Result Date: 05/04/2021 CLINICAL DATA:  Chronic renal disease. EXAM: RENAL / URINARY TRACT ULTRASOUND COMPLETE COMPARISON:  CT abdomen and pelvis March 14, 2021. FINDINGS: Right Kidney: Renal measurements: 10.2 x 4.2 x 5.0 cm = volume: 112.0 mL. Mild renal cortical thinning. No hydronephrosis. Left Kidney: Renal measurements: May 0.4 x 4.5 x 3.9 cm = volume: 76.7 mL. Mild renal cortical thinning. No hydronephrosis. Bladder: Decompressed with Foley catheter. Other: None. IMPRESSION: No hydronephrosis. Electronically Signed   By: Annia Beltrew  Davis M.D.   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 Catheter needed for plasmapheresis. EXAM: FLUOROSCOPIC AND ULTRASOUND GUIDED PLACEMENT OF A NON-TUNNELED DIALYSIS CATHETER Physician: Rachelle HoraAdam R. Henn, MD MEDICATIONS: 1% lidocaine ANESTHESIA/SEDATION: None FLUOROSCOPY TIME:  Fluoroscopy Time: 12 seconds, 7 mGy COMPLICATIONS: None immediate. PROCEDURE: Informed consent was obtained for catheter placement. The patient was placed supine on the interventional table. Ultrasound confirmed a patent right internal jugular vein. Ultrasound images were obtained for documentation. The right neck was prepped and draped in a sterile fashion. The right neck was anesthetized with 1% lidocaine. Maximal barrier sterile technique was utilized including caps,  mask, sterile gowns, sterile gloves, sterile drape, hand hygiene and skin antiseptic.  A small incision was made with #11 blade scalpel. A 21 gauge needle directed into the right internal jugular vein with ultrasound guidance. A micropuncture dilator set was placed. A 15 cm Trialysis was selected. The catheter was advanced over a wire and positioned at the superior cavoatrial junction. Fluoroscopic images were obtained for documentation. Both dialysis lumens were found to aspirate and flush well. The proper amount of heparin was flushed in both lumens. The central venous lumen was flushed with normal saline. Catheter was sutured to skin. FINDINGS: Catheter tip at the superior cavoatrial junction. IMPRESSION: Successful placement of a right jugular non-tunneled dialysis/pheresis catheter using ultrasound and fluoroscopic guidance. Electronically Signed   By: Richarda Overlie M.D.   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 Catheter needed for plasmapheresis.   EXAM: FLUOROSCOPIC AND ULTRASOUND GUIDED PLACEMENT OF A NON-TUNNELED DIALYSIS CATHETER   Physician: Rachelle Hora. Henn, MD   MEDICATIONS: 1% lidocaine   ANESTHESIA/SEDATION: None   FLUOROSCOPY TIME:  Fluoroscopy Time: 12 seconds, 7 mGy   COMPLICATIONS: None immediate.   PROCEDURE: Informed consent was obtained for catheter placement. The patient was placed supine on the interventional table. Ultrasound confirmed a patent right internal jugular vein. Ultrasound images were obtained for documentation. The right neck was prepped and draped in a sterile fashion. The right neck was anesthetized with 1% lidocaine. Maximal barrier sterile technique was utilized including caps, mask, sterile gowns, sterile gloves, sterile drape, hand hygiene and skin antiseptic. A small incision was made with #11 blade scalpel. A 21 gauge needle directed into the right internal jugular vein with ultrasound guidance. A  micropuncture dilator set was placed. A 15 cm Trialysis was selected. The catheter was advanced over a wire and positioned at the superior cavoatrial junction. Fluoroscopic images were obtained for documentation. Both dialysis lumens were found to aspirate and flush well. The proper amount of heparin was flushed in both lumens. The central venous lumen was flushed with normal saline. Catheter was sutured to skin.   FINDINGS: Catheter tip at the superior cavoatrial junction.   IMPRESSION: Successful placement of a right jugular non-tunneled dialysis/pheresis catheter using ultrasound and fluoroscopic guidance.     Electronically Signed   By: Richarda Overlie M.D.   On: 05/08/2021 16:58    DG FL GUIDED LUMBAR PUNCTURE  Result Date: 05/08/2021 CLINICAL DATA:  Optic neuritis, altered mental status EXAM: DIAGNOSTIC LUMBAR PUNCTURE UNDER FLUOROSCOPIC GUIDANCE COMPARISON:  None FLUOROSCOPY TIME:  Fluoroscopy Time:  12 seconds Radiation Exposure Index (if provided by the fluoroscopic device): 1.5 mGy Number of Acquired Spot Images: 0 PROCEDURE: Informed consent was obtained from the patient prior to the procedure, including potential complications of headache, allergy, and pain. With the patient prone, the lower back was prepped with Betadine. 1% Lidocaine was used for local anesthesia. Lumbar puncture was performed at the L4-L5 level using a 20 gauge needle with return of clear CSF with an opening pressure of 17 cm water. Approximately 13 ml of CSF were obtained for laboratory studies. The patient tolerated the procedure well and there were no apparent complications. IMPRESSION: Successful lumbar puncture. Approximately 13 mL of clear CSF were obtained and sent to the laboratory for analysis. There were no immediate complications. Electronically Signed   By: Caprice Renshaw   On: 05/08/2021 09:48   MR ORBITS W WO CONTRAST  Result Date: 05/05/2021 CLINICAL DATA:  Optic neuritis suspected. EXAM: MRI OF THE ORBITS WITHOUT AND  WITH CONTRAST TECHNIQUE: Multiplanar, multi-echo pulse sequences of the orbits and surrounding structures were acquired including fat saturation techniques, before and after intravenous contrast administration. CONTRAST:  10mL GADAVIST GADOBUTROL 1 MMOL/ML IV SOLN COMPARISON:  Abnormal MRI of the brain. FINDINGS: Orbits: Globes are within normal limits bilaterally. The optic nerves are unremarkable. Chiasm is within normal limits. Abnormal signal or enhancement is present. Extraocular muscles are within normal limits. Visualized sinuses: Polyp or mucous retention cyst is noted in the left maxillary sinus. The paranasal sinuses and mastoid air cells are otherwise clear. Soft tissues: Periorbital soft tissues and visualized facial soft tissues are within normal limits. 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 There is subtle enhancement along the periaqueductal gray matter and medial thalami. IMPRESSION: 1. Normal MRI appearance of the orbits. 2. Subtle enhancement along the periaqueductal gray matter and medial thalami bilaterally. This corresponds to the abnormal signal changes on the MRI from yesterday. In addition to a demyelinating process, or Wernicke encephalopathy (thiamine deficiency encephalopathy) is also considered. Electronically Signed   By: Marin Roberts M.D.   On: 05/05/2021 13:25    Antimicrobials:  ABX for hypylori day 3   Subjective: Sister reports significant increase in PO intake  Objective: Vitals:   05/11/21 2307 05/12/21 0304 05/12/21 0746 05/12/21 1210  BP: (!) 82/47 99/63 106/61 120/74  Pulse: 70 66 (!) 55 67  Resp: Temp: 97.8 F (36.6 C) 98.8 F (37.1 C) 98.3 F (36.8 C) 98.5 F (36.9 C)  TempSrc: Oral Oral Oral Oral  SpO2: 98% 100% 100% 99%  Weight:      Height:        Intake/Output Summary (Last 24 hours) at 05/12/2021 1217 Last data filed at 05/12/2021 0600 Gross per 24 hour  Intake --  Output 600  ml  Net -600 ml   Filed Weights   05/09/21 0519 05/10/21 0351 05/10/21 1415  Weight: 99.6 kg 97.6 kg 96.4 kg    Examination:  General exam: SLEEPING BUT AWAKENS Respiratory system: Clear to auscultation. Respiratory effort normal. Cardiovascular system: S1 & S2 heard, RRR. No JVD, murmurs, rubs, gallops or clicks. No pedal edema. Gastrointestinal system: Abdomen is nondistended, soft and nontender. No organomegaly or masses felt. Normal bowel sounds heard. Central nervous system: awakens,  still with dysconjugate gaze Extremities: wwp, nv in Skin: No rashes, lesions or ulcers Psychiatry: no acute decomp     Data Reviewed: I have personally reviewed following labs and imaging studies  CBC: Recent Labs  Lab 05/10/21 0559 05/11/21 0437  WBC 9.0 15.2*  NEUTROABS 6.4 13.3*  HGB 13.7 15.2*  HCT 42.8 45.7  MCV 87.7 85.3  PLT 130* 205   Basic Metabolic Panel: Recent Labs  Lab 05/07/21 0914 05/08/21 0325 05/10/21 0337 05/11/21 0437  NA 144 142 139 138  K 3.7 4.0 4.7 3.8  CL 116* 113* 113* 106  CO2 GLUCOSE 143* 142* 87 116*  BUN 21* CREATININE 1.04* 1.00 0.93 1.02*  CALCIUM 8.5* 8.8* 7.8* 8.9   GFR: Estimated Creatinine Clearance: 104.9 mL/min (A) (by C-G formula based on SCr of 1.02 mg/dL (H)). Liver Function Tests: Recent Labs  Lab 05/10/21 0337  AST 33  ALT 28  ALKPHOS 20*  BILITOT 1.2  PROT 4.4*  ALBUMIN 3.2*   No results for input(s): LIPASE, AMYLASE in the last 168 hours. No results for input(s): AMMONIA in the last 168 hours. Coagulation  Profile: No results for input(s): INR, PROTIME in the last 168 hours. Cardiac Enzymes: No results for input(s): CKTOTAL, CKMB, CKMBINDEX, TROPONINI in the last 168 hours. BNP (last 3 results) No results for input(s): PROBNP in the last 8760 hours. HbA1C: No results for input(s): HGBA1C in the last 72 hours. CBG: No results for input(s): GLUCAP in the last 168 hours. Lipid Profile: No  results for input(s): CHOL, HDL, LDLCALC, TRIG, CHOLHDL, LDLDIRECT in the last 72 hours. Thyroid Function Tests: No results for input(s): TSH, T4TOTAL, FREET4, T3FREE, THYROIDAB in the last 72 hours. Anemia Panel: No results for input(s): VITAMINB12, FOLATE, FERRITIN, TIBC, IRON, RETICCTPCT in the last 72 hours. Sepsis Labs: No results for input(s): PROCALCITON, LATICACIDVEN in the last 168 hours.  Recent Results (from the past 240 hour(s))  Culture, Urine     Status: Abnormal   Collection Time: 05/07/21 11:31 PM   Specimen: Urine, Random  Result Value Ref Range Status   Specimen Description URINE, RANDOM  Final   Special Requests   Final    NONE Performed at [facility], [address], [place], [place] [address]    Culture (A)  Final    >=100,000 COLONIES/mL ESCHERICHIA COLI Confirmed Extended Spectrum Beta-Lactamase Producer (ESBL).  In bloodstream infections from ESBL organisms, carbapenems are preferred over piperacillin/tazobactam. They are shown to have a lower risk of mortality.    Report Status 05/10/2021 FINAL  Final   Organism ID, Bacteria ESCHERICHIA COLI (A)  Final      Susceptibility   Escherichia coli - MIC*    AMPICILLIN >=32 RESISTANT Resistant     CEFAZOLIN >=64 RESISTANT Resistant     CEFEPIME 2 SENSITIVE Sensitive     CEFTRIAXONE >=64 RESISTANT Resistant     CIPROFLOXACIN 0.5 SENSITIVE Sensitive     GENTAMICIN >=16 RESISTANT Resistant     IMIPENEM <=0.25 SENSITIVE Sensitive     NITROFURANTOIN <=16 SENSITIVE Sensitive     TRIMETH/SULFA >=320 RESISTANT Resistant     AMPICILLIN/SULBACTAM >=32 RESISTANT Resistant     PIP/TAZO <=4 SENSITIVE Sensitive     * >=100,000 COLONIES/mL ESCHERICHIA COLI  CSF culture w Gram Stain     Status: None   Collection Time: 05/08/21  9:21 AM   Specimen: PATH Cytology CSF; Cerebrospinal Fluid  Result Value Ref Range Status   Specimen Description CSF  Final   Special Requests NONE  Final   Gram Stain CYTOSPIN SMEAR NO  WBC SEEN NO ORGANISMS SEEN   Final   Culture   Final    NO GROWTH 3 DAYS Performed at [facility], [address] [place], [place] [address]    Report Status 05/11/2021 FINAL  Final  Fungus Culture With Stain     Status: None (Preliminary result)   Collection Time: 05/08/21  9:21 AM   Specimen: PATH Cytology CSF; Cerebrospinal Fluid  Result Value Ref Range Status   Fungus Stain Final report  Final    Comment: (NOTE) Performed At: [facility] [address] [place], [place] [id number] Jolene Schimke MD XF:[id number]    Fungus (Mycology) Culture PENDING  Incomplete   Fungal Source CSF  Final    Comment: Performed at [facility], [address], [place], [place] [address]  Anaerobic culture w Gram Stain     Status: None (Preliminary result)   Collection Time: 05/08/21  9:21 AM   Specimen: PATH Cytology CSF; Cerebrospinal Fluid  Result Value Ref Range Status   Specimen Description CSF  Final   Special Requests NONE  Final   Gram Stain   Final    WBC PRESENT, PREDOMINANTLY MONONUCLEAR NO ORGANISMS SEEN CYTOSPIN SMEAR Performed at [facility], [address] [place], [place] [address]    Culture   Final    NO ANAEROBES ISOLATED; CULTURE IN PROGRESS FOR 5 DAYS   Report Status PENDING  Incomplete  Fungus Culture Result     Status: None   Collection Time: 05/08/21  9:21 AM  Result Value Ref Range Status   Result 1 Comment  Final    Comment: (NOTE) KOH/Calcofluor preparation:  no fungus observed. Performed At: [facility] [address] [place], [place] [id number] Jolene Schimke MD SH:[id number]          Radiology Studies: No results found.      Scheduled Meds:  amoxicillin  1,000 mg Oral Q12H   Chlorhexidine Gluconate Cloth  6 each Topical Daily   clarithromycin  500 mg Oral Q12H   docusate sodium  100 mg Oral BID   feeding supplement  1 Container Oral TID BM   levothyroxine  50 mcg Intravenous Daily   lidocaine (PF)  5  mL Intradermal Once   metoprolol succinate  25 mg Oral Daily   multivitamin with minerals  1 tablet Oral Daily   pantoprazole  40 mg Oral BID   Continuous Infusions:  sodium chloride       LOS: 9 days    Time spent: 35 min    Burke Keels, MD [facility]  If 7PM-7AM, please contact night-coverage  05/12/2021, 12:17 PM

## 2021-05-13 DIAGNOSIS — I959 Hypotension, unspecified: Secondary | ICD-10-CM

## 2021-05-13 LAB — URINALYSIS, ROUTINE W REFLEX MICROSCOPIC
Bilirubin Urine: NEGATIVE
Glucose, UA: NEGATIVE mg/dL
Ketones, ur: NEGATIVE mg/dL
Nitrite: NEGATIVE
Protein, ur: 100 mg/dL — AB
RBC / HPF: >50 RBC/hpf — ABNORMAL HIGH (ref 0–5)
Specific Gravity, Urine: 1.013 (ref 1.005–1.030)
WBC, UA: >50 WBC/hpf — ABNORMAL HIGH (ref 0–5)
pH: 6 (ref 5.0–8.0)

## 2021-05-13 LAB — CBC WITH DIFFERENTIAL/PLATELET
Abs Immature Granulocytes: 0.33 10*3/uL — ABNORMAL HIGH (ref 0.00–0.07)
Basophils Absolute: 0 10*3/uL (ref 0.0–0.1)
Basophils Relative: 0 %
Eosinophils Absolute: 0.1 10*3/uL (ref 0.0–0.5)
Eosinophils Relative: 1 %
HCT: 41.5 % (ref 36.0–46.0)
Hemoglobin: 13.3 g/dL (ref 12.0–15.0)
Immature Granulocytes: 3 %
Lymphocytes Relative: 15 %
Lymphs Abs: 1.6 10*3/uL (ref 0.7–4.0)
MCH: 28.1 pg (ref 26.0–34.0)
MCHC: 32 g/dL (ref 30.0–36.0)
MCV: 87.6 fL (ref 80.0–100.0)
Monocytes Absolute: 0.8 10*3/uL (ref 0.1–1.0)
Monocytes Relative: 7 %
Neutro Abs: 8.3 10*3/uL — ABNORMAL HIGH (ref 1.7–7.7)
Neutrophils Relative %: 74 %
Platelets: 212 10*3/uL (ref 150–400)
RBC: 4.74 MIL/uL (ref 3.87–5.11)
RDW: 18.9 % — ABNORMAL HIGH (ref 11.5–15.5)
WBC: 11.1 10*3/uL — ABNORMAL HIGH (ref 4.0–10.5)
nRBC: 0 % (ref 0.0–0.2)

## 2021-05-13 LAB — COMPREHENSIVE METABOLIC PANEL
ALT: 40 U/L (ref 0–44)
AST: 24 U/L (ref 15–41)
Albumin: 3.4 g/dL — ABNORMAL LOW (ref 3.5–5.0)
Alkaline Phosphatase: 36 U/L — ABNORMAL LOW (ref 38–126)
Anion gap: 6 (ref 5–15)
BUN: 16 mg/dL (ref 6–20)
CO2: 27 mmol/L (ref 22–32)
Calcium: 8.8 mg/dL — ABNORMAL LOW (ref 8.9–10.3)
Chloride: 109 mmol/L (ref 98–111)
Creatinine, Ser: 1.01 mg/dL — ABNORMAL HIGH (ref 0.44–1.00)
GFR, Estimated: >60 mL/min (ref >60)
Glucose, Bld: 85 mg/dL (ref 70–99)
Potassium: 3.2 mmol/L — ABNORMAL LOW (ref 3.5–5.1)
Sodium: 142 mmol/L (ref 135–145)
Total Bilirubin: 1.4 mg/dL — ABNORMAL HIGH (ref 0.3–1.2)
Total Protein: 5 g/dL — ABNORMAL LOW (ref 6.5–8.1)

## 2021-05-13 LAB — OLIGOCLONAL BANDS, CSF + SERM

## 2021-05-13 LAB — IGG CSF INDEX
Albumin CSF-mCnc: 24 mg/dL (ref 7–29)
Albumin: 3.8 g/dL — ABNORMAL LOW (ref 3.9–5.0)
CSF IgG Index: 2 — ABNORMAL HIGH (ref 0.0–0.7)
IgG (Immunoglobin G), Serum: 314 mg/dL — ABNORMAL LOW (ref 586–1602)
IgG, CSF: 3.9 mg/dL (ref 0.0–6.7)
IgG/Alb Ratio, CSF: 0.16 (ref 0.00–0.25)

## 2021-05-13 LAB — ANAEROBIC CULTURE W GRAM STAIN

## 2021-05-13 LAB — VITAMIN B1: Vitamin B1 (Thiamine): 38.3 nmol/L — ABNORMAL LOW (ref 66.5–200.0)

## 2021-05-13 MED ORDER — ACETAMINOPHEN 325 MG PO TABS: 650.0000 mg | ORAL_TABLET | ORAL | Status: DC | PRN

## 2021-05-13 MED ORDER — BOOST / RESOURCE BREEZE PO LIQD CUSTOM
1.0000 | Freq: Three times a day (TID) | ORAL | Status: DC
  Administered 2021-05-13 – 2021-05-17 (×10): 1 via ORAL

## 2021-05-13 MED ORDER — LACTATED RINGERS IV BOLUS
1000.0000 mL | Freq: Once | INTRAVENOUS | Status: AC
  Administered 2021-05-13: 1000 mL via INTRAVENOUS

## 2021-05-13 MED ORDER — ALBUMIN HUMAN 25 % IV SOLN
INTRAVENOUS | Status: AC
  Filled 2021-05-13 (×3): qty 200

## 2021-05-13 MED ORDER — DIPHENHYDRAMINE HCL 25 MG PO CAPS: 25.0000 mg | ORAL_CAPSULE | Freq: Four times a day (QID) | ORAL | Status: DC | PRN

## 2021-05-13 MED ORDER — SODIUM CHLORIDE 0.9 % IV SOLN
1.0000 g | Freq: Three times a day (TID) | INTRAVENOUS | Status: DC
  Administered 2021-05-13 – 2021-05-15 (×5): 1 g via INTRAVENOUS
  Filled 2021-05-13 (×7): qty 1

## 2021-05-13 MED ORDER — CALCIUM GLUCONATE-NACL 2-0.675 GM/100ML-% IV SOLN
2.0000 g | Freq: Once | INTRAVENOUS | Status: AC
  Administered 2021-05-13: 2000 mg via INTRAVENOUS
  Filled 2021-05-13: qty 100

## 2021-05-13 MED ORDER — CALCIUM GLUCONATE 10 % IV SOLN
2.0000 g | Freq: Once | INTRAVENOUS | Status: DC
  Filled 2021-05-13 (×2): qty 20

## 2021-05-13 MED ORDER — ACD FORMULA A 0.73-2.45-2.2 GM/100ML VI SOLN
Status: AC
  Administered 2021-05-13: 1000 mL
  Filled 2021-05-13: qty 500

## 2021-05-13 MED ORDER — ANTICOAGULANT SODIUM CITRATE 4% (200MG/5ML) IV SOLN
5.0000 mL | Freq: Once | Status: DC
  Filled 2021-05-13: qty 5

## 2021-05-13 MED ORDER — ENSURE ENLIVE PO LIQD
237.0000 mL | Freq: Three times a day (TID) | ORAL | Status: DC
  Administered 2021-05-13: 237 mL via ORAL

## 2021-05-13 MED ORDER — SODIUM CHLORIDE 0.9 % IV SOLN: 1.0000 g | INTRAVENOUS | Status: DC

## 2021-05-13 MED ORDER — POTASSIUM CHLORIDE CRYS ER 20 MEQ PO TBCR
40.0000 meq | EXTENDED_RELEASE_TABLET | Freq: Once | ORAL | Status: AC
  Administered 2021-05-13: 40 meq via ORAL
  Filled 2021-05-13: qty 2

## 2021-05-13 MED ORDER — ACD FORMULA A 0.73-2.45-2.2 GM/100ML VI SOLN
1000.0000 mL | Status: DC
  Filled 2021-05-13 (×2): qty 1000

## 2021-05-13 NOTE — Progress Notes (Addendum)
Occupational Therapy Treatment Patient Details Name: Diane Todd MRN: [id number] DOB: [birth year] Today's Date: 05/13/2021    History of present illness 20 yo female who presents from [facility] on 05/03/21 with worsening blurry vision and difficulty walking. She has had 2 months of N&V and was dx with H pylori gastritis and treated at [facility]. 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 PMH: HTN, thyroid disorder.   OT comments  Pt progressing towards established OT goals and continues to present with motivation to participate in therapy. Pt performing sit<>stand from EOB with Max A +2 and use of sara stedy. Pt completing grooming tasks at sink with Min A for balance while sitting on stedy. Pt completing peri care with Mod A while standing (using stedy). Noting pt's catheter leaking and urine milky white color; notified RN. Continue to recommend dc to CIR for intensive OT and will continue to follow acutely as admitted.    Follow Up Recommendations  CIR    Equipment Recommendations  3 in 1 bedside commode;Tub/shower seat;Wheelchair (measurements OT);Wheelchair cushion (measurements OT)    Recommendations for Other Services Rehab consult    Precautions / Restrictions Precautions Precautions: Fall Precaution Comments: pt fell at home PTA Restrictions Weight Bearing Restrictions: No       Mobility Bed Mobility Overal bed mobility: Needs Assistance Bed Mobility: Supine to Sit     Supine to sit: Min assist;+2 for physical assistance     General bed mobility comments: vc's for sequencing. Pt able to slide LE's off bed with cues. Min A for initiation of shoulder to roll and lift and min HHA for wt shift to L from elevated HOB. Pt needed mod A to scoot hips to EOB evenly    Transfers Overall transfer level: Needs assistance Equipment used: Ambulation equipment used Transfers: Sit to/from Stand Sit to Stand: Max assist;+2 physical  assistance;+2 safety/equipment;Min assist;From elevated surface         General transfer comment: max A +2 from bed, min A +2 from stedy flaps at sink and when transitioning to chair. Pt unable to control descent to chair.    Balance Overall balance assessment: Needs assistance Sitting-balance support: Feet supported;Bilateral upper extremity supported Sitting balance-Leahy Scale: Fair Sitting balance - Comments: able to maintain sitting EOB with superivison in midline, unable to self correct for LOB, in part due to fatigue   Standing balance support: Bilateral upper extremity supported;During functional activity Standing balance-Leahy Scale: Poor Standing balance comment: mod to maintain standing with single UE support                           ADL either performed or assessed with clinical judgement   ADL Overall ADL's : Needs assistance/impaired     Grooming: Wash/dry face;Wash/dry hands;Minimal assistance;Sitting;Moderate assistance Grooming Details (indicate cue type and reason): Min-Mod A for balance while sitting on sara stedy at sink. Cues for sequencing washing her face and hands.                     Toileting- Clothing Manipulation and Hygiene: Moderate assistance;+2 for physical assistance;Sit to/from stand Toileting - Clothing Manipulation Details (indicate cue type and reason): Mod A for posterior peri care. Min-Mod A for balance in standing. Pt particiapting in performing both naterioer and posterior peri care     Functional mobility during ADLs: Maximal assistance;Minimal assistance;+2 for physical assistance (sit<>stand with stedy) General ADL Comments: Pt performing  sit<>stand with stedy, grooming tasks at sink, and washing up peri area at sink.     Vision   Vision Assessment?: Vision impaired- to be further tested in functional context;Yes Additional Comments: Use of occlusion glasses (tap on nasal portion of L eye   Perception     Praxis       Cognition Arousal/Alertness: Lethargic Behavior During Therapy: Flat affect Overall Cognitive Status: Impaired/Different from baseline Area of Impairment: Safety/judgement;Following commands;Attention;Memory;Problem solving                   Current Attention Level: Selective;Sustained Memory: Decreased short-term memory Following Commands: Follows one step commands inconsistently Safety/Judgement: Decreased awareness of safety;Decreased awareness of deficits Awareness: Intellectual Problem Solving: Slow processing;Requires verbal cues;Requires tactile cues General Comments: Frequently complains of feeing fatigued and head feeling heavy.  Benefiting from increased time and cues.        Exercises Exercises: General Lower Extremity General Exercises - Lower Extremity Ankle Circles/Pumps: AROM;Both;10 reps;Supine Long Arc Quad: Both;5 reps;Seated Other Exercises Other Exercises: cervical retraction x5 Other Exercises: cervical rotation to each side Other Exercises: lateral flexion to each side   Shoulder Instructions       General Comments Noted pt had leaking from catheter that was milky with foul odor. RN notified.    Pertinent Vitals/ Pain       Pain Assessment: Faces Faces Pain Scale: Hurts little more Pain Location: neck Pain Descriptors / Indicators: Sore Pain Intervention(s): Monitored during session;Limited activity within patient's tolerance;Repositioned  Home Living                                          Prior Functioning/Environment              Frequency  Min 2X/week        Progress Toward Goals  OT Goals(current goals can now be found in the care plan section)  Progress towards OT goals: Progressing toward goals  Acute Rehab OT Goals Patient Stated Goal: be able to get up OT Goal Formulation: With patient/family Time For Goal Achievement: 05/19/21 Potential to Achieve Goals: Good ADL Goals Pt Will Perform  Grooming: with min assist;sitting Pt Will Perform Upper Body Dressing: with min assist;sitting Pt Will Transfer to Toilet: with max assist;with +2 assist;squat pivot transfer;bedside commode Pt/caregiver will Perform Home Exercise Program: Increased strength;Both right and left upper extremity;With minimal assist;With written HEP provided Additional ADL Goal #1: Pt will use compensatory strategies to increase visual competence in order to perform ADL and mobility tasks with minimal cues. Additional ADL Goal #2: Pt will increase to x3 mins sitting at EOB with modA for stability supports in prep for OOB ADL.  Plan Discharge plan remains appropriate    Co-evaluation    PT/OT/SLP Co-Evaluation/Treatment: Yes Reason for Co-Treatment: For patient/therapist safety;To address functional/ADL transfers PT goals addressed during session: Mobility/safety with mobility;Balance;Proper use of DME;Strengthening/ROM OT goals addressed during session: ADL's and self-care      AM-PAC OT "6 Clicks" Daily Activity     Outcome Measure   Help from another person eating meals?: A Lot Help from another person taking care of personal grooming?: A Lot Help from another person toileting, which includes using toliet, bedpan, or urinal?: A Lot Help from another person bathing (including washing, rinsing, drying)?: A Lot Help from another person to put on and taking off regular upper body clothing?: Total  Help from another person to put on and taking off regular lower body clothing?: Total 6 Click Score: 10    End of Session Equipment Utilized During Treatment: Gait belt  OT Visit Diagnosis: Unsteadiness on feet (R26.81);Muscle weakness (generalized) (M62.81);Other symptoms and signs involving cognitive function;Low vision, both eyes (H54.2);Pain Pain - Right/Left: Right Pain - part of body: Arm   Activity Tolerance Patient tolerated treatment well   Patient Left in chair;with call bell/phone within  reach;with family/visitor present   Nurse Communication Mobility status        Time: 8315-1761 OT Time Calculation (min): 36 min  Charges: OT General Charges $OT Visit: 1 Visit OT Treatments $Self Care/Home Management : 8-22 mins  Tyreek Clabo MSOT, OTR/L Acute Rehab Pager: [phone] Office: [phone]   Theodoro Grist Legrand Lasser 05/13/2021, 2:44 PM

## 2021-05-13 NOTE — Progress Notes (Signed)
Pharmacy Antibiotic Note  Diane Todd is a 20 y.o. female admitted on 05/03/2021 with ESBL Ecoli UTI and on plasmapheresis.  Pharmacy has been consulted for meropenem dosing. Patient is also on amoxicillin + Clarithromycin for H. Pylori treatment.  Previously patient was asymptomatic so decision was made to not treat. UA and WBC appear to be worsening so will treat now.   Plan: Stop ceftriaxone Start meropenem 1g q8 hr F/u duration, renal fx   Height: 5\' 7"  (170.2 cm) Weight: 90.6 kg (199 lb 11.8 oz) IBW/kg (Calculated) : 61.6  Temp (24hrs), Avg:98.5 F (36.9 C), Min:98 F (36.7 C), Max:99.1 F (37.3 C)  Recent Labs  Lab 05/07/21 0914 05/08/21 0325 05/10/21 0337 05/10/21 0559 05/11/21 0437 05/13/21 0650  WBC  --   --   --  9.0 15.2* 11.1*  CREATININE 1.04* 1.00 0.93  --  1.02* 1.01*    Estimated Creatinine Clearance: 102.7 mL/min (A) (by C-G formula based on SCr of 1.01 mg/dL (H)).    No Known Allergies  Antimicrobials this admission: Amox 6/24 >> 7/7 Clarithro 6/24 >> 7/7 Meropenem 6/27 >>    Microbiology results: 6/22 CSF ngtd 6/21 UCx: ESBL Ecoli   Thank you for allowing pharmacy to be a part of this patient's care.  7/21, PharmD, BCPS, BCCP Clinical Pharmacist  Please check AMION for all [facility] Pharmacy phone numbers After 10:00 PM, call Main Pharmacy [phone]

## 2021-05-13 NOTE — Progress Notes (Addendum)
PROGRESS NOTE    Diane Todd  GDJ:[id number] DOB: [birth year] DOA: 05/03/2021 PCP: Pcp, No   Brief Narrative:  Diane Todd is an 20 y.o. female past medical history of essential hypertension hypothyroidism transferred from [facility] for concern after her MRI showed multiple brain abnormalities she has been having generalized weakness poor appetite and blurry vision.   Neurology was consulted and appreciate assistance, recommended serum for AQP4-IgG antibody pending, MOG-IgG antibody negative. Completed 5 days of high-dose steroids and 3 days of high-dose thiamine, c/w thiamine [place] Monday 3 of 5 sessions, on a MWF schedule. Physical therapy evaluated the patient recommended inpatient rehab. 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 Physical therapy evaluated the patient recommended inpatient rehab. Placed on IV fluids half-normal saline with D5, see below nutrition for further details.   Essential hypertension: Blood pressures well controlled, on metoprolol.   Acute Kidney injury on chronic kidney disease stage III: On IV fluids due to decreased oral intake. Basic metabolic  panels pending.   Hypothyroidism TSH of 0.3.   History of H. pylori: Treatment initated today 6/24 Amoxicillin 1g bid, clarithromycin 500mg  bid, ppi bid all for 14 days, day 4/14   Cloudy urine asx Ordered u/a and cx Immunosuppressed  Nutrition: Sister reports significant dietary intake saturday Advancing diet, poss cortrak Monday if po intake not sufficient.- although encouraging increase in PO intake satruday     DVT prophylaxis: SCD/Compression stockings  Code Status: full      Code Status Orders  (From admission, onward)           Start     Ordered   05/04/21 0802  Full code  Continuous        05/04/21 0801           Code Status History     Date Active Date Inactive Code Status Order ID Comments User Context   05/04/2021 0309 05/04/2021 0801 Full Code 05/06/2021  [id number], MD Inpatient      Family Communication: sister at bedside  Disposition Plan:   CIR vs SNF Consults called:  NEURO Admission status: Inpatient Status is: Inpatient   Remains inpatient appropriate because:Hemodynamically unstable   Dispo: The patient is from: Home              Anticipated d/c is to: Home              Patient currently is not medically stable to d/c.              Difficult to place patient No   Consultants:  AS ABOVE  Procedures:  MR BRAIN W WO CONTRAST  Result Date: 05/04/2021 CLINICAL DATA:  Weakness and blurry vision. EXAM: MRI HEAD WITH AND WITHOUT CONTRAST MRI CERVICAL AND THORACIC SPINE WITHOUT AND WITH CONTRAST TECHNIQUE: Multiplanar  and multiecho pulse sequences of the head and cervical spine, to include the craniocervical junction and cervicothoracic junction, and the thoracic spine, were obtained without and with intravenous contrast. CONTRAST:  10mL GADAVIST GADOBUTROL 1 MMOL/ML IV SOLN COMPARISON:  None. FINDINGS: MRI BRAIN FINDINGS Brain: No acute infarct, mass effect or extra-axial collection. No acute or chronic hemorrhage. There is  hyperintense T2-weighted signal within the medial thalami and periaqueductal gray matter. There is mild contrast enhancement of the periaqueductal gray matter but no other enhancing lesion. The midline structures are normal. Vascular: Major flow voids are preserved. Skull and upper cervical spine: Normal calvarium and skull base. Visualized upper cervical spine and soft tissues are normal. Sinuses/Orbits:No paranasal sinus fluid levels or advanced mucosal thickening. No mastoid or middle ear effusion. Normal orbits. MRI CERVICAL SPINE FINDINGS Alignment: Physiologic. Vertebrae: No fracture, evidence of discitis, or bone lesion. Cord: Normal signal and morphology. No abnormal contrast enhancement. Posterior Fossa, vertebral arteries, paraspinal tissues: Negative. Disc levels: No spinal canal or neural foraminal stenosis. MRI THORACIC SPINE FINDINGS Alignment:  Physiologic. Vertebrae: No fracture, evidence of discitis, or bone lesion. Cord: Normal signal and morphology. No abnormal contrast enhancement Paraspinal and other soft tissues: Negative. Disc levels: T7-8: Small right subarticular disc protrusion without stenosis. T8-9: Small left subarticular disc protrusion without stenosis. The other thoracic disc levels are normal. IMPRESSION: 1. Hyperintense T2-weighted signal and mild contrast enhancement within the medial thalami and periaqueductal gray matter. Demyelinating disease remains a primary consideration. MRI of the orbits with and without contrast might be helpful for better characterization of the optic nerves. CSF sampling should also be considered. 2. No demyelinating lesions of the cervical or thoracic spinal cord. Electronically Signed   By: Deatra Robinson M.D.   On: 05/04/2021 02:42   MR CERVICAL SPINE W WO CONTRAST  Result Date: 05/04/2021 CLINICAL DATA:  Weakness and blurry vision. EXAM: MRI HEAD WITH AND WITHOUT CONTRAST MRI CERVICAL AND THORACIC SPINE WITHOUT AND WITH CONTRAST TECHNIQUE:  Multiplanar and multiecho pulse sequences of the head and cervical spine, to include the craniocervical junction and cervicothoracic junction, and the thoracic spine, were obtained without and with intravenous contrast. CONTRAST:  10mL GADAVIST GADOBUTROL 1 MMOL/ML IV SOLN COMPARISON:  None. FINDINGS: MRI BRAIN FINDINGS Brain: No acute infarct, mass effect or extra-axial collection. No acute or chronic hemorrhage. There is hyperintense T2-weighted signal within the medial thalami and periaqueductal gray matter. There is mild contrast enhancement of the periaqueductal gray matter but no other enhancing lesion. The midline structures are normal. Vascular: Major flow voids are preserved. Skull and upper cervical spine: Normal calvarium and skull base. Visualized upper cervical spine and soft tissues are normal. Sinuses/Orbits:No paranasal sinus fluid levels or advanced mucosal thickening. No mastoid or middle ear effusion. Normal orbits. MRI CERVICAL SPINE FINDINGS Alignment: Physiologic. Vertebrae: No fracture, evidence of discitis, or bone lesion. Cord: Normal signal and morphology. No abnormal contrast enhancement. Posterior Fossa, vertebral arteries, paraspinal tissues: Negative. Disc levels: No spinal canal or neural foraminal stenosis. MRI THORACIC SPINE FINDINGS Alignment:  Physiologic. Vertebrae: No fracture, evidence of discitis, or bone lesion. Cord: Normal signal and morphology. No abnormal contrast enhancement Paraspinal and other soft tissues: Negative. Disc levels: T7-8: Small right subarticular disc protrusion without stenosis. T8-9: Small left subarticular disc protrusion without stenosis. The other thoracic disc levels are normal. IMPRESSION: 1. Hyperintense T2-weighted signal and mild contrast enhancement within the medial thalami and periaqueductal gray matter. Demyelinating disease remains a primary consideration. MRI of the orbits with and  without contrast might be helpful for better  characterization of the optic nerves. CSF sampling should also be considered. 2. No demyelinating lesions of the cervical or thoracic spinal cord. Electronically Signed   By: Deatra Robinson M.D.   On: 05/04/2021 02:42   MR THORACIC SPINE W WO CONTRAST  Result Date: 05/04/2021 CLINICAL DATA:  Weakness and blurry vision. EXAM: MRI HEAD WITH AND WITHOUT CONTRAST MRI CERVICAL AND THORACIC SPINE WITHOUT AND WITH CONTRAST TECHNIQUE: Multiplanar and multiecho pulse sequences of the head and cervical spine, to include the craniocervical junction and cervicothoracic junction, and the thoracic spine, were obtained without and with intravenous contrast. CONTRAST:  54mL GADAVIST GADOBUTROL 1 MMOL/ML IV SOLN COMPARISON:  None. FINDINGS: MRI BRAIN FINDINGS Brain: No acute infarct, mass effect or extra-axial collection. No acute or chronic hemorrhage. There is hyperintense T2-weighted signal within the medial thalami and periaqueductal gray matter. There is mild contrast enhancement of the periaqueductal gray matter but no other enhancing lesion. The midline structures are normal. Vascular: Major flow voids are preserved. Skull and upper cervical spine: Normal calvarium and skull base. Visualized upper cervical spine and soft tissues are normal. Sinuses/Orbits:No paranasal sinus fluid levels or advanced mucosal thickening. No mastoid or middle ear effusion. Normal orbits. MRI CERVICAL SPINE FINDINGS Alignment: Physiologic. Vertebrae: No fracture, evidence of discitis, or bone lesion. Cord: Normal signal and morphology. No abnormal contrast enhancement. Posterior Fossa, vertebral arteries, paraspinal tissues: Negative. Disc levels: No spinal canal or neural foraminal stenosis. MRI THORACIC SPINE FINDINGS Alignment:  Physiologic. Vertebrae: No fracture, evidence of discitis, or bone lesion. Cord: Normal signal and morphology. No abnormal contrast enhancement Paraspinal and other soft tissues: Negative. Disc levels: T7-8: Small  right subarticular disc protrusion without stenosis. T8-9: Small left subarticular disc protrusion without stenosis. The other thoracic disc levels are normal. IMPRESSION: 1. Hyperintense T2-weighted signal and mild contrast enhancement within the medial thalami and periaqueductal gray matter. Demyelinating disease remains a primary consideration. MRI of the orbits with and without contrast might be helpful for better characterization of the optic nerves. CSF sampling should also be considered. 2. No demyelinating lesions of the cervical or thoracic spinal cord. Electronically Signed   By: Deatra Robinson M.D.   On: 05/04/2021 02:42   US RENAL  Result Date: 05/04/2021 CLINICAL DATA:  Chronic renal disease. EXAM: RENAL / URINARY TRACT ULTRASOUND COMPLETE COMPARISON:  CT abdomen and pelvis March 14, 2021. FINDINGS: Right Kidney: Renal measurements: 10.2 x 4.2 x 5.0 cm = volume: 112.0 mL. Mild renal cortical thinning. No hydronephrosis. Left Kidney: Renal measurements: May 0.4 x 4.5 x 3.9 cm = volume: 76.7 mL. Mild renal cortical thinning. No hydronephrosis. Bladder: Decompressed with Foley catheter. Other: None. IMPRESSION: No hydronephrosis. Electronically Signed   By: Annia Belt M.D.   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 Catheter needed for plasmapheresis. EXAM: FLUOROSCOPIC AND ULTRASOUND GUIDED PLACEMENT OF A NON-TUNNELED DIALYSIS CATHETER Physician: Rachelle Hora. Henn, MD MEDICATIONS: 1% lidocaine ANESTHESIA/SEDATION: None FLUOROSCOPY TIME:  Fluoroscopy Time: 12 seconds, 7 mGy COMPLICATIONS: None immediate. PROCEDURE: Informed consent was obtained for catheter placement. The patient was placed supine on the interventional table. Ultrasound confirmed a patent right internal jugular vein. Ultrasound images were obtained for documentation. The right neck was prepped and draped in a sterile fashion. The right neck  was anesthetized with 1% lidocaine. Maximal barrier sterile technique was utilized including caps, mask, sterile gowns, sterile gloves, sterile  drape, hand hygiene and skin antiseptic. A small incision was made with #11 blade scalpel. A 21 gauge needle directed into the right internal jugular vein with ultrasound guidance. A micropuncture dilator set was placed. A 15 cm Trialysis was selected. The catheter was advanced over a wire and positioned at the superior cavoatrial junction. Fluoroscopic images were obtained for documentation. Both dialysis lumens were found to aspirate and flush well. The proper amount of heparin was flushed in both lumens. The central venous lumen was flushed with normal saline. Catheter was sutured to skin. FINDINGS: Catheter tip at the superior cavoatrial junction. IMPRESSION: Successful placement of a right jugular non-tunneled dialysis/pheresis catheter using ultrasound and fluoroscopic guidance. Electronically Signed   By: Richarda Overlie M.D.   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 Catheter needed for plasmapheresis.   EXAM: FLUOROSCOPIC AND ULTRASOUND GUIDED PLACEMENT OF A NON-TUNNELED DIALYSIS CATHETER   Physician: Rachelle Hora. Henn, MD   MEDICATIONS: 1% lidocaine   ANESTHESIA/SEDATION: None   FLUOROSCOPY TIME:  Fluoroscopy Time: 12 seconds, 7 mGy   COMPLICATIONS: None immediate.   PROCEDURE: Informed consent was obtained for catheter placement. The patient was placed supine on the interventional table. Ultrasound confirmed a patent right internal jugular vein. Ultrasound images were obtained for documentation. The right neck was prepped and draped in a sterile fashion. The right neck was anesthetized with 1% lidocaine. Maximal barrier sterile technique was utilized including caps, mask, sterile gowns, sterile gloves, sterile drape, hand hygiene and skin antiseptic. A small incision was made with #11 blade  scalpel. A 21 gauge needle directed into the right internal jugular vein with ultrasound guidance. A micropuncture dilator set was placed. A 15 cm Trialysis was selected. The catheter was advanced over a wire and positioned at the superior cavoatrial junction. Fluoroscopic images were obtained for documentation. Both dialysis lumens were found to aspirate and flush well. The proper amount of heparin was flushed in both lumens. The central venous lumen was flushed with normal saline. Catheter was sutured to skin.   FINDINGS: Catheter tip at the superior cavoatrial junction.   IMPRESSION: Successful placement of a right jugular non-tunneled dialysis/pheresis catheter using ultrasound and fluoroscopic guidance.     Electronically Signed   By: Richarda Overlie M.D.   On: 05/08/2021 16:58    DG FL GUIDED LUMBAR PUNCTURE  Result Date: 05/08/2021 CLINICAL DATA:  Optic neuritis, altered mental status EXAM: DIAGNOSTIC LUMBAR PUNCTURE UNDER FLUOROSCOPIC GUIDANCE COMPARISON:  None FLUOROSCOPY TIME:  Fluoroscopy Time:  12 seconds Radiation Exposure Index (if provided by the fluoroscopic device): 1.5 mGy Number of Acquired Spot Images: 0 PROCEDURE: Informed consent was obtained from the patient prior to the procedure, including potential complications of headache, allergy, and pain. With the patient prone, the lower back was prepped with Betadine. 1% Lidocaine was used for local anesthesia. Lumbar puncture was performed at the L4-L5 level using a 20 gauge needle with return of clear CSF with an opening pressure of 17 cm water. Approximately 13 ml of CSF were obtained for laboratory studies. The patient tolerated the procedure well and there were no apparent complications. IMPRESSION: Successful lumbar puncture. Approximately 13 mL of clear CSF were obtained and sent to the laboratory for analysis. There were no immediate complications. Electronically Signed   By: Caprice Renshaw   On: 05/08/2021 09:48   MR ORBITS W WO  CONTRAST  Result Date: 05/05/2021 CLINICAL DATA:  Optic neuritis suspected. EXAM:  MRI OF THE ORBITS WITHOUT AND WITH CONTRAST TECHNIQUE: Multiplanar, multi-echo pulse sequences of the orbits and surrounding structures were acquired including fat saturation techniques, before and after intravenous contrast administration. CONTRAST:  10mL GADAVIST GADOBUTROL 1 MMOL/ML IV SOLN COMPARISON:  Abnormal MRI of the brain. FINDINGS: Orbits: Globes are within normal limits bilaterally. The optic nerves are unremarkable. Chiasm is within normal limits. Abnormal signal or enhancement is present. Extraocular muscles are within normal limits. Visualized sinuses: Polyp or mucous retention cyst is noted in the left maxillary sinus. The paranasal sinuses and mastoid air cells are otherwise clear. Soft tissues: Periorbital soft tissues and visualized facial soft tissues are within normal limits. 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 There is subtle enhancement along the periaqueductal gray matter and medial thalami. IMPRESSION: 1. Normal MRI appearance of the orbits. 2. Subtle enhancement along the periaqueductal gray matter and medial thalami bilaterally. This corresponds to the abnormal signal changes on the MRI from yesterday. In addition to a demyelinating process, or Wernicke encephalopathy (thiamine deficiency encephalopathy) is also considered. Electronically Signed   By: Marin Roberts M.D.   On: 05/05/2021 13:25    Antimicrobials:  ABX for hypylori day 4/14    Subjective: Awake and interactive this AM answering questions appropriatekly Still maintaining good PO intake  Objective: Vitals:   05/12/21 2039 05/12/21 2313 05/13/21 0440 05/13/21 0806  BP: (!) 132/55 128/73 130/79 109/75  Pulse: 88 79 76 64  Resp: Temp: 98.7 F (37.1 C) 98.3 F (36.8 C) 98 F (36.7 C) 98.7 F (37.1 C)  TempSrc: Oral Oral  Oral  SpO2: 99% 100% 100% 100%   Weight:      Height:        Intake/Output Summary (Last 24 hours) at 05/13/2021 1055 Last data filed at 05/13/2021 0500 Gross per 24 hour  Intake 120 ml  Output 800 ml  Net -680 ml   Filed Weights   05/09/21 0519 05/10/21 0351 05/10/21 1415  Weight: 99.6 kg 97.6 kg 96.4 kg    Examination:  General exam: Awake this AM, answering questions appropriately Respiratory system: Clear to auscultation. Respiratory effort normal. Cardiovascular system: S1 & S2 heard, RRR. No JVD, murmurs, rubs, gallops or clicks. No pedal edema. Gastrointestinal system: Abdomen is nondistended, soft and nontender. No organomegaly or masses felt. Normal bowel sounds heard. Central nervous system: awake and alert  still with dysconjugate gaze Extremities: wwp, nv intact Skin: No rashes, lesions or ulcers Psychiatry: no acute decomp     Data Reviewed: I have personally reviewed following labs and imaging studies  CBC: Recent Labs  Lab 05/10/21 0559 05/11/21 0437 05/13/21 0650  WBC 9.0 15.2* 11.1*  NEUTROABS 6.4 13.3* 8.3*  HGB 13.7 15.2* 13.3  HCT 42.8 45.7 41.5  MCV 87.7 85.3 87.6  PLT 130* 205 212   Basic Metabolic Panel: Recent Labs  Lab 05/07/21 0914 05/08/21 0325 05/10/21 0337 05/11/21 0437 05/13/21 0650  NA 144 142 139 138 142  K 3.7 4.0 4.7 3.8 3.2*  CL 116* 113* 113* 106 109  CO2 GLUCOSE 143* 142* 87 116* 85  BUN 21* CREATININE 1.04* 1.00 0.93 1.02* 1.01*  CALCIUM 8.5* 8.8* 7.8* 8.9 8.8*   GFR: Estimated Creatinine Clearance: 105.9 mL/min (A) (by C-G formula based on SCr of 1.01 mg/dL (H)). Liver Function Tests: Recent Labs  Lab 05/10/21 0337 05/13/21 0650  AST 33 24  ALT 28 40  ALKPHOS 20* 36*  BILITOT 1.2 1.4*  PROT 4.4* 5.0*  ALBUMIN 3.2* 3.4*   No results for input(s): LIPASE, AMYLASE in the last 168 hours. No results for input(s): AMMONIA in the last 168 hours. Coagulation Profile: No results for input(s): INR, PROTIME in the  last 168 hours. Cardiac Enzymes: No results for input(s): CKTOTAL, CKMB, CKMBINDEX, TROPONINI in the last 168 hours. BNP (last 3 results) No results for input(s): PROBNP in the last 8760 hours. HbA1C: No results for input(s): HGBA1C in the last 72 hours. CBG: No results for input(s): GLUCAP in the last 168 hours. Lipid Profile: No results for input(s): CHOL, HDL, LDLCALC, TRIG, CHOLHDL, LDLDIRECT in the last 72 hours. Thyroid Function Tests: No results for input(s): TSH, T4TOTAL, FREET4, T3FREE, THYROIDAB in the last 72 hours. Anemia Panel: No results for input(s): VITAMINB12, FOLATE, FERRITIN, TIBC, IRON, RETICCTPCT in the last 72 hours. Sepsis Labs: No results for input(s): PROCALCITON, LATICACIDVEN in the last 168 hours.  Recent Results (from the past 240 hour(s))  Culture, Urine     Status: Abnormal   Collection Time: 05/07/21 11:31 PM   Specimen: Urine, Random  Result Value Ref Range Status   Specimen Description URINE, RANDOM  Final   Special Requests   Final    NONE Performed at [facility], [address], [place], [place] [address]    Culture (A)  Final    >=100,000 COLONIES/mL ESCHERICHIA COLI Confirmed Extended Spectrum Beta-Lactamase Producer (ESBL).  In bloodstream infections from ESBL organisms, carbapenems are preferred over piperacillin/tazobactam. They are shown to have a lower risk of mortality.    Report Status 05/10/2021 FINAL  Final   Organism ID, Bacteria ESCHERICHIA COLI (A)  Final      Susceptibility   Escherichia coli - MIC*    AMPICILLIN >=32 RESISTANT Resistant     CEFAZOLIN >=64 RESISTANT Resistant     CEFEPIME 2 SENSITIVE Sensitive     CEFTRIAXONE >=64 RESISTANT Resistant     CIPROFLOXACIN 0.5 SENSITIVE Sensitive     GENTAMICIN >=16 RESISTANT Resistant     IMIPENEM <=0.25 SENSITIVE Sensitive     NITROFURANTOIN <=16 SENSITIVE Sensitive     TRIMETH/SULFA >=320 RESISTANT Resistant     AMPICILLIN/SULBACTAM >=32 RESISTANT Resistant      PIP/TAZO <=4 SENSITIVE Sensitive     * >=100,000 COLONIES/mL ESCHERICHIA COLI  CSF culture w Gram Stain     Status: None   Collection Time: 05/08/21  9:21 AM   Specimen: PATH Cytology CSF; Cerebrospinal Fluid  Result Value Ref Range Status   Specimen Description CSF  Final   Special Requests NONE  Final   Gram Stain CYTOSPIN SMEAR NO WBC SEEN NO ORGANISMS SEEN   Final   Culture   Final    NO GROWTH 3 DAYS Performed at [facility], [address], [place], [place] [address]    Report Status 05/11/2021 FINAL  Final  Fungus Culture With Stain     Status: None (Preliminary result)   Collection Time: 05/08/21  9:21 AM   Specimen: PATH Cytology CSF; Cerebrospinal Fluid  Result Value Ref Range Status   Fungus Stain Final report  Final    Comment: (NOTE) Performed At: [facility] [address] [place], [place] [id number] Jolene Schimke MD QM:[id number]    Fungus (Mycology) Culture PENDING  Incomplete   Fungal Source CSF  Final    Comment: Performed at [facility], [address] [place],  [place] [id number]  Anaerobic culture w Gram Stain     Status: None (Preliminary result)   Collection Time: 05/08/21  9:21 AM   Specimen: PATH Cytology CSF; Cerebrospinal Fluid  Result Value Ref Range Status   Specimen Description CSF  Final   Special Requests NONE  Final   Gram Stain   Final    WBC PRESENT, PREDOMINANTLY MONONUCLEAR NO ORGANISMS SEEN CYTOSPIN SMEAR Performed at [facility], [address] [place], [place] [id number]    Culture   Final    NO ANAEROBES ISOLATED; CULTURE IN PROGRESS FOR 5 DAYS   Report Status PENDING  Incomplete  Fungus Culture Result     Status: None   Collection Time: 05/08/21  9:21 AM  Result Value Ref Range Status   Result 1 Comment  Final    Comment: (NOTE) KOH/Calcofluor preparation:  no fungus observed. Performed At: [facility] [address] [place], [place] [id number] Jolene SchimkeNagendra Sanjai MD WG:[id number]:[phone]           Radiology Studies: No results found.      Scheduled Meds:  amoxicillin  1,000 mg Oral Q12H   Chlorhexidine Gluconate Cloth  6 each Topical Daily   clarithromycin  500 mg Oral Q12H   docusate sodium  100 mg Oral BID   feeding supplement  237 mL Oral TID BM   levothyroxine  50 mcg Intravenous Daily   lidocaine (PF)  5 mL Intradermal Once   metoprolol succinate  25 mg Oral Daily   multivitamin with minerals  1 tablet Oral Daily   pantoprazole  40 mg Oral BID   Continuous Infusions:  sodium chloride       LOS: 10 days    Time spent: 35  min    Burke Keelshristopher Jacory Kamel, MD [facility]  If 7PM-7AM, please contact night-coverage  05/13/2021, 10:55 AM

## 2021-05-13 NOTE — Progress Notes (Signed)
Physical Therapy Treatment Patient Details Name: Diane Todd MRN: [id number] DOB: 06-30-2001 Today's Date: 05/13/2021    History of Present Illness 20 yo female who presents from [facility] on 05/03/21 with worsening blurry vision and difficulty walking. She has had 2 months of N&V and was dx with H pylori gastritis and treated at [facility]. 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 PMH: HTN, thyroid disorder.    PT Comments    Pt making progress with mobility but it is slow. She is complaining of neck pain in upright sitting and has difficulty holding head up. Encouraged her that she needs to be upright more and gave neck exercises for ROM and strengthening. Educated her sister with these exercises as well. Pt stood to stedy from bed with max A +2 but was able to stand from flaps of stedy with min A +2. Noted foul smelling, milky brown urine leaking at catheter site. RN notified. PT will continue to follow.    Follow Up Recommendations  CIR;Supervision/Assistance - 24 hour     Equipment Recommendations  Other (comment) (TBD)    Recommendations for Other Services Rehab consult;OT consult;Speech consult     Precautions / Restrictions Precautions Precautions: Fall Precaution Comments: pt fell at home PTA Restrictions Weight Bearing Restrictions: No    Mobility  Bed Mobility Overal bed mobility: Needs Assistance Bed Mobility: Supine to Sit     Supine to sit: Min assist;+2 for physical assistance Sit to supine: Max assist   General bed mobility comments: vc's for sequencing. Pt able to slide LE's off bed with cues. Min A for initiation of shoulder to roll and lift and min HHA for wt shift to L from elevated HOB. Pt needed mod A to scoot hips to EOB evenly    Transfers Overall transfer level: Needs assistance Equipment used: Ambulation equipment used Transfers: Sit to/from Stand Sit to Stand: Max assist;+2 physical  assistance;+2 safety/equipment;Min assist;From elevated surface        Lateral/Scoot Transfers: Max assist General transfer comment: max A +2 from bed, min A +2 from stedy flaps at sink and when transitioning to chair. Pt unable to control descent to chair.  Ambulation/Gait             General Gait Details: worked on standing tolerance in stedy for pregait. Pt initially had LE discomfort with WBing but seemed to improve each time up   Stairs             Wheelchair Mobility    Modified Rankin (Stroke Patients Only)       Balance Overall balance assessment: Needs assistance Sitting-balance support: Feet supported;Bilateral upper extremity supported Sitting balance-Leahy Scale: Fair Sitting balance - Comments: able to maintain sitting EOB with superivison in midline, unable to self correct for LOB, in part due to fatigue   Standing balance support: Bilateral upper extremity supported;During functional activity Standing balance-Leahy Scale: Poor Standing balance comment: mod to maintain standing with single UE support                            Cognition Arousal/Alertness: Lethargic Behavior During Therapy: Flat affect Overall Cognitive Status: Impaired/Different from baseline Area of Impairment: Safety/judgement;Following commands;Attention;Memory;Problem solving                   Current Attention Level: Selective;Sustained Memory: Decreased short-term memory Following Commands: Follows one step commands inconsistently Safety/Judgement: Decreased awareness of safety;Decreased awareness  of deficits Awareness: Intellectual Problem Solving: Slow processing;Requires verbal cues;Requires tactile cues General Comments: Pt keeps eyes closed.  Frequently complains of feeing fatigued and head feeling heavy.  Cannot recall events of yesterday      Exercises General Exercises - Lower Extremity Ankle Circles/Pumps: AROM;Both;10 reps;Supine Long Arc  Quad: Both;5 reps;Seated Other Exercises Other Exercises: cervical retraction x5 Other Exercises: cervical rotation to each side Other Exercises: lateral flexion to each side    General Comments General comments (skin integrity, edema, etc.): patched glasses used for activities at sink. Noted pt had leaking from catheter that was milky with foul odor. RN notified.      Pertinent Vitals/Pain Pain Assessment: Faces Faces Pain Scale: Hurts little more Pain Location: neck Pain Descriptors / Indicators: Sore Pain Intervention(s): Limited activity within patient's tolerance;Monitored during session    Home Living                      Prior Function            PT Goals (current goals can now be found in the care plan section) Acute Rehab PT Goals Patient Stated Goal: be able to get up PT Goal Formulation: With patient Time For Goal Achievement: 05/18/21 Potential to Achieve Goals: Good Progress towards PT goals: Progressing toward goals    Frequency    Min 3X/week      PT Plan Current plan remains appropriate    Co-evaluation PT/OT/SLP Co-Evaluation/Treatment: Yes Reason for Co-Treatment: Complexity of the patient's impairments (multi-system involvement);Necessary to address cognition/behavior during functional activity;For patient/therapist safety PT goals addressed during session: Mobility/safety with mobility;Balance;Proper use of DME;Strengthening/ROM OT goals addressed during session: ADL's and self-care      AM-PAC PT "6 Clicks" Mobility   Outcome Measure  Help needed turning from your back to your side while in a flat bed without using bedrails?: A Lot Help needed moving from lying on your back to sitting on the side of a flat bed without using bedrails?: A Lot Help needed moving to and from a bed to a chair (including a wheelchair)?: Total Help needed standing up from a chair using your arms (e.g., wheelchair or bedside chair)?: Total Help needed to  walk in hospital room?: Total Help needed climbing 3-5 steps with a railing? : Total 6 Click Score: 8    End of Session Equipment Utilized During Treatment: Gait belt Activity Tolerance: Patient limited by lethargy Patient left: with call bell/phone within reach;with family/visitor present;with chair alarm set;in chair Nurse Communication: Mobility status (catheter leaking) PT Visit Diagnosis: Muscle weakness (generalized) (M62.81);History of falling (Z91.81);Difficulty in walking, not elsewhere classified (R26.2)     Time: 7672-0947 PT Time Calculation (min) (ACUTE ONLY): 33 min  Charges:  $Therapeutic Activity: 8-22 mins                     Lyanne Co, PT  Acute Rehab Services  Pager [phone] Office [phone]    Lawana Chambers Kamaryn Grimley 05/13/2021, 11:34 AM

## 2021-05-13 NOTE — Progress Notes (Signed)
[facility] night shift MedSurg coverage note.  The patient was seen due to hypotension of 77/40 mmHg.The rest of the vital signs were as follows temperature 98.2 F, pulse 106, respirations 17 and O2 sat 98% on room air.  Meropenem was just recently started for ESBL E. coli UTI.  Please see pharmacy antibiotic note. She is in no acute distress, her lips are dry, but her oral mucosa is moist.  Lungs are CTA.  Cardiovascular S1-S2 at 96 bpm, no edema.  No flank or suprapubic tenderness.  After ordering at 1000 mL of LR bolus her heart rate decreased to 93 bpm and her blood pressure was 101/52 mmHg.    Sanda Klein, MD.  05/14/2021 addendum: 0130 I added LR 125 mL x 8 hours.

## 2021-05-13 NOTE — Progress Notes (Signed)
  Speech Language Pathology Treatment: Dysphagia  Patient Details Name: Diane Todd MRN: [id number] DOB: 2000-11-27 Today's Date: 05/13/2021 Time: 1914-7829 SLP Time Calculation (min) (ACUTE ONLY): 18 min  Assessment / Plan / Recommendation Clinical Impression  Pt seen for ongoing dysphagia management.  Pt was very tired on SLP arrival following completion of OT, but was agreeable to trials of more advanced solids.  Pt tolerated mechanical soft fruit with no clinical s/s of aspiration and exhibited excellent oral clearance.  Despite fatigue, oral transit and swallow reflex appeared prompt.  Sister reports good intake with puree diet and reports that pt has been requesting french fries.  Pt had cupcake with family to celebrate another sister's birthday yesterday.  Pt's swallow still appears functional and concerns are related to maximizing PO intake rather than safety with swallow.  Discussed diet preferences with pt and her sister and will advance to mechanical soft solids to allow a wider range of foods and hopefully stimulate appetite.  Recommend mechanical soft solids with thin liquids.     HPI HPI: Diane Todd is a 20 y.o. 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  Per review of chart patient has been to the [facility] ED multiple times over the past 2 months for range of issues.  Over this time she has consistently endorsed chronic nausea and vomiting.  Has also had work-up for gastritis and found to have H. pylori.  Also noted to have baseline creatinine of around 1.5 with unclear chronicity.  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  MRI of the brain was showing  hyperintense T2-weighted signal and mild contrast enhancement within the medial thalami and periaqueductal gray matter concering for demyelinating disease.      SLP Plan  Continue with current plan of care       Recommendations  Diet recommendations: Dysphagia 3 (mechanical soft);Thin liquid Liquids provided via: Cup;Straw Medication Administration:  (As tolerated) Supervision: Trained caregiver to feed patient;Staff to assist with self feeding Compensations: Slow rate;Small sips/bites Postural Changes and/or Swallow Maneuvers: Seated upright 90 degrees                Oral Care Recommendations: Oral care BID Follow up Recommendations: 24 hour supervision/assistance SLP Visit Diagnosis: Dysphagia, unspecified (R13.10) Plan: Continue with current plan of care       GO                Kerrie Pleasure, MA, CCC-SLP Acute Rehabilitation Services Office: [phone] 05/13/2021, 11:41 AM

## 2021-05-13 NOTE — Progress Notes (Signed)
Neurology Progress Note  Brief HPI: Diane Todd is a 20 y.o. female with PMH significant for recent Hpylori infection, intractable nausea with poor po intake since April 2022, who initially presented to OSH on 6/16 with 2.5 weeks hx of blurred vision which progressed to ataxia. 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 MRI Brain and C spine with and w/o contrast with hyperintense T2-weighted signal and mild contrast enhancement within the medial thalami and  periaqueductal gray matter. MRI Orbits with no significant abnormality. 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 There was no significant improvement with steroids so she was started on PLEX- to be complete 05/17/2021.   Subjective: No acute overnight events Patient with reports of improved oral intake without vomiting  PLEX day 3/5 to be completed today, last dose 05/17/2021  Exam: Vitals:   05/13/21 0440 05/13/21 0806  BP: 130/79 109/75  Pulse: 76 64  Resp: 18 20  Temp: 98 F (36.7 C) 98.7 F (37.1 C)  SpO2: 100% 100%   Gen: Laying comfortably in bed, sleeping initially, in no acute distress Resp: non-labored breathing on room air, no respiratory distress Abd: soft, non-distended, non-tender  Neuro: Mental Status: Lethargic, wakes to voice. She is oriented to person, age, month, year, and place. Her speech is slowed and soft but is without dysarthria or aphasia.  She is intermittently tearful throughout assessment and expresses gratitude for health care team. No neglect is noted.  Cranial Nerves: PERRL, no visual field deficits. Eyes remain dysconjugate with ophthalmoplegia and persistent limited lateral gaze. There are 3-4 beats of nystagmus present in each eye with right and left lateral gaze with the ipsilateral eye nystagmus more pronounced-bilateral INO.  Facial sensation intact and symmetric to  light touch, face is symmetric resting and smiling, hearing is intact to voice, palate elevates symmetrically, tongue protrudes midline.  Motor: 4+/5 bilateral upper extremity triceps, biceps without vertical drift. Bilateral lower extremities and ankle plantar/dorsiflexion 4/5 strength without vertical drift on assessment.  Tone and bulk are normal Sensory: Sensation to light touch intact and symmetric in upper and lower extremities DTR: 2+ and symmetric patellae and biceps Gait: Deferred  Pertinent Labs: CBC    Component Value Date/Time   WBC 11.1 (H) 05/13/2021 0650   RBC 4.74 05/13/2021 0650   HGB 13.3 05/13/2021 0650   HCT 41.5 05/13/2021 0650   PLT 212 05/13/2021 0650   MCV 87.6 05/13/2021 0650   MCH 28.1 05/13/2021 0650   MCHC 32.0 05/13/2021 0650   RDW 18.9 (H) 05/13/2021 0650   LYMPHSABS 1.6 05/13/2021 0650   MONOABS 0.8 05/13/2021 0650   EOSABS 0.1 05/13/2021 0650   BASOSABS 0.0 05/13/2021 0650   CMP     Component Value Date/Time   NA 142 05/13/2021 0650   K 3.2 (L) 05/13/2021 0650   CL 109 05/13/2021 0650   CO2 27 05/13/2021 0650   GLUCOSE 85 05/13/2021 0650   BUN 16 05/13/2021 0650   CREATININE 1.01 (H) 05/13/2021 0650   CALCIUM 8.8 (L) 05/13/2021 0650   PROT 5.0 (L) 05/13/2021 0650   ALBUMIN 3.4 (L) 05/13/2021 0650   AST 24 05/13/2021 0650   ALT 40 05/13/2021 0650   ALKPHOS 36 (L) 05/13/2021 0650   BILITOT 1.4 (H) 05/13/2021 0650   GFRNONAA >60 05/13/2021 0650   CSF studies: Glucose: 80 Protein: 45 WBC: 8 OCB: pending IgG index: pending.  MOG Ab is negative. Culture NO GROWTH 3 DAYS    AQP4 Ab is pending, IgG index is pending, OCB pending.  Thiamine level: pending  Imaging Reviewed: MRI Orbits with and without contrast: 1. Normal MRI appearance of the orbits. 2. Subtle enhancement along the periaqueductal gray matter and medial thalami bilaterally. This corresponds to the abnormal signal changes on the MRI from yesterday. In addition to a  demyelinating process, or Wernicke encephalopathy (thiamine deficiency encephalopathy) is also considered.   MRI Brain with and without cotnrast(personally reviewed): 1. Hyperintense T2-weighted signal and mild contrast enhancement within the medial thalami and periaqueductal gray matter. Demyelinating disease remains a primary consideration. MRI of the orbits with and without contrast might be helpful for better characterization of the optic nerves. CSF sampling should also be considered. 2. No demyelinating lesions of the cervical or thoracic spinal cord.   MRI C and T spine with and without contrast: No demyelinating lesions of the cervical or thoracic spinal cord.  Assessment: Diane Todd is a 20 y.o. female with PMH significant for recent Hpylori infection, intractable nausea with poor po intake since April 2022, who initially presented to OSH on 6/16 with 2.5 weeks hx of blurred vision which progressed to ataxia. 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 - Examination reveals continued improvement in her somnolence but with persistent ophthalmoplegia and blurred vision. Sister at bedside reports improved oral intake without recent episodes of vomiting.  - Involvement of the medial thalami might explain decreased arousal and involvement of the periaqueductal gray might explain her nausea. - Initially treated with IV solumedrol without significant improvement. PLEX initiated 05/09/2021 to be completed 05/17/2021. Thiamine level, AQP4-Ab, IgG index, and OCB pending.   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  Recommendations: - Completed 5/5 days of IV solumedrol without significant improvement - PLEX, session 3/5 to be completed today with final round planned 05/17/2021 - Thiamine level, AQP4-Ab, IgG index, and OCB pending.  - Continue Thiamine replacement -  Neurology will continue to follow  Lanae Boast, AGACNP-BC [facility] [phone]  Attending Neurohospitalist Addendum Patient seen and examined with APP/Resident. Agree with the history and physical as documented above. Agree with the plan as documented, which I helped formulate. I have independently reviewed the chart, obtained history, review of systems and examined the patient.I have personally reviewed pertinent head/neck/spine imaging (CT/MRI). Please feel free to call with any questions.  -- Milon Dikes, MD Neurologist [facility] Pager: [phone]

## 2021-05-13 NOTE — Progress Notes (Signed)
Nutrition Follow-up  DOCUMENTATION CODES:  Severe malnutrition in context of acute illness/injury  INTERVENTION:  Continue current diet as tolerated. Encourage PO intake. Advance as able per SLP recommendations Boost Breeze po TID, each supplement provides 250 kcal and 9 grams of protein Magic cup TID with meals, each supplement provides 290 kcal and 9 grams of protein MVI with minerals daily. If intake declines or pt begins to refuse nutrition supplements, recommend Cortrak placement and starting tube feeding on 6/29. If tube feeds are needed, recommend the following: Osmolite 1.5 @ 64ml/hr via Cortrak (Initiate at 75ml/hr and increase by 10 ml every 8 hours until goal rate is reached) ProSource TF 33ml BID via tube Free water flushes q4 hours  Regimen provides 2420 kcal, 120 gm protein, and 1908 ml free water.   NUTRITION DIAGNOSIS:  Severe Malnutrition related to acute illness as evidenced by energy intake < or equal to 50% for > or equal to 1 month, percent weight loss. - ongoing  GOAL:  Patient will meet greater than or equal to 90% of their needs - not meeting  MONITOR:  PO intake, Supplement acceptance, Labs, Weight trends, Skin, I & O's  REASON FOR ASSESSMENT:  Malnutrition Screening Tool    ASSESSMENT:  20 y.o. female with history of hypertension, hypothyroidism and H. 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  6/18 pt had change in mental status and difficult to arouse. Was made NPO at that time when code stroke was called as pt unable to follow commands and swallow safety. Neurology determined clinical presentation and MRI findings were most consistent with NMO.   Attempted LP 6/21, unsuccessful due to body habitus. Successful 6/22 in radiology.  Admit wt: 98.6 kg Current wt: 97.6 kg  Discussed intake with RN this AM as pt is current scheduled to have cortrak tube placed today if  intake has been inadequate. RN reports that sister has been at bedside with pt for most of admission and assisting pt with meals. Sister reported to RN that pt has been consuming the majority of her puree tray and consistently drinking boost breeze supplements. Noted meal intake % is limited in flowsheet, but two recorded 50% intakes. Diet advanced further to DYS 3 diet this AM.   Called and spoke with pt's sister on phone. Also reiterates that pt has been doing well with her intake. States that pt is consistently eating each meal and is putting in a lot of effort to try and increase her intake. Like boost breeze and magic up. Has been consuming both each time they are received.   Sister also reports that pt reported feeling hungry today which is a vast improvement from the last several months. States that she has not vomited in several days and that she has also not been complaining of nausea. Did discuss changing supplements to ensure enlive to provide additional kcal and protein. Sister states pt did receive one this AM but does not like the taste (this was noted by RD in previous note as well). Will leave boost breeze as pt tolerates and intake is improving.   Given pt's success in advancing diet and increasing oral intake, would defer placement of cortrak tube today. Could continue to monitor and re-evaluate need on Wednesday if pt is not tolerating diet or has declined. Discussed nutrition plan with sister and pt, agreeable to plan no questions at this time.   Diet Order Hx: 6/18: Regular (SLP  evaluation) 6/19-6/23: NPO 6/24: Clear Liquids (SLP evaluation) 6/25-6/26: DYS 1 (SLP evaluation) 6/27: DYS 3  Average Meal Intake: 6/26-6/27: 50% intake x 2 recorded meals  Nutritionally Relevant Medications: Scheduled Meds:  amoxicillin  1,000 mg Oral Q12H   clarithromycin  500 mg Oral Q12H   docusate sodium  100 mg Oral BID   feeding supplement  237 mL Oral TID BM   multivitamin with minerals   1 tablet Oral Daily   pantoprazole  40 mg Oral BID   PRN Meds: ondansetron  Labs Reviewed: K - 3.2  Diet Order:   Diet Order             DIET DYS 3 Room service appropriate? Yes; Fluid consistency: Thin  Diet effective now                  EDUCATION NEEDS:  Education needs have been addressed  Skin:  Skin Assessment: Reviewed RN Assessment (ecchymosis)  Last BM:  PTA - bowel regimen in place  Height:  Ht Readings from Last 1 Encounters:  05/04/21 5\' 7"  (1.702 m)   Weight:  Wt Readings from Last 1 Encounters:  05/10/21 96.4 kg   Ideal Body Weight:  61.36 kg  BMI:  Body mass index is 33.29 kg/m.  Estimated Nutritional Needs:  Kcal:  2200-2500kcal/day Protein:  110-125g/day Fluid:  1.9-2.2L/day  05/12/21, RD, LDN Clinical Dietitian Pager on Amion

## 2021-05-14 LAB — BASIC METABOLIC PANEL
Anion gap: 5 (ref 5–15)
BUN: 11 mg/dL (ref 6–20)
CO2: 23 mmol/L (ref 22–32)
Calcium: 8.2 mg/dL — ABNORMAL LOW (ref 8.9–10.3)
Chloride: 115 mmol/L — ABNORMAL HIGH (ref 98–111)
Creatinine, Ser: 0.88 mg/dL (ref 0.44–1.00)
GFR, Estimated: >60 mL/min (ref >60)
Glucose, Bld: 130 mg/dL — ABNORMAL HIGH (ref 70–99)
Potassium: 2.9 mmol/L — ABNORMAL LOW (ref 3.5–5.1)
Sodium: 143 mmol/L (ref 135–145)

## 2021-05-14 LAB — CBC
HCT: 40.2 % (ref 36.0–46.0)
Hemoglobin: 13.1 g/dL (ref 12.0–15.0)
MCH: 28.9 pg (ref 26.0–34.0)
MCHC: 32.6 g/dL (ref 30.0–36.0)
MCV: 88.5 fL (ref 80.0–100.0)
Platelets: 203 10*3/uL (ref 150–400)
RBC: 4.54 MIL/uL (ref 3.87–5.11)
RDW: 19.9 % — ABNORMAL HIGH (ref 11.5–15.5)
WBC: 14.3 10*3/uL — ABNORMAL HIGH (ref 4.0–10.5)
nRBC: 0 % (ref 0.0–0.2)

## 2021-05-14 LAB — NEUROMYELITIS OPTICA AUTOAB, IGG: NMO-IgG: <1.5 U/mL (ref 0.0–3.0)

## 2021-05-14 MED ORDER — POTASSIUM CHLORIDE 2 MEQ/ML IV SOLN
INTRAVENOUS | Status: DC
  Filled 2021-05-14: qty 1000

## 2021-05-14 MED ORDER — LACTATED RINGERS IV SOLN: INTRAVENOUS | Status: AC

## 2021-05-14 MED ORDER — POTASSIUM CHLORIDE CRYS ER 20 MEQ PO TBCR
40.0000 meq | EXTENDED_RELEASE_TABLET | Freq: Once | ORAL | Status: AC
  Administered 2021-05-14: 40 meq via ORAL
  Filled 2021-05-14: qty 2

## 2021-05-14 NOTE — Progress Notes (Signed)
Physical Therapy Treatment Patient Details Name: Diane Todd MRN: [id number] DOB: 31-Aug-2001 Today's Date: 05/14/2021    History of Present Illness 20 yo female who presents from [facility] on 05/03/21 with worsening blurry vision and difficulty walking. She has had 2 months of N&V and was dx with H pylori gastritis and treated at [facility]. 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 PMH: HTN, thyroid disorder.    PT Comments    Pt more alert today and fed herself her full breakfast and does not have c/o of nausea or stomach pain. Worked on standing and gait activities in stedy including standing reaching, wt shifting, and stepping feet in place. Pt maintained standing for 5 mins while listening to her favorite song and singing along for much of it. She continues to have STM deficits but can recall events from yesterday when given clues. Spoke with her sister about other activities to do in room to help stimulate her. PT will continue to follow.    Follow Up Recommendations  CIR;Supervision/Assistance - 24 hour     Equipment Recommendations  Other (comment) (TBD)    Recommendations for Other Services Rehab consult;OT consult;Speech consult     Precautions / Restrictions Precautions Precautions: Fall Precaution Comments: pt fell at home PTA Restrictions Weight Bearing Restrictions: No    Mobility  Bed Mobility Overal bed mobility: Needs Assistance Bed Mobility: Supine to Sit     Supine to sit: Mod assist     General bed mobility comments: increased time and mod A of 1 for elevation of trunk into sitting. Pt able to move LE's off EOB on her own.    Transfers Overall transfer level: Needs assistance Equipment used: Ambulation equipment used Transfers: Sit to/from Stand Sit to Stand: From elevated surface;Min assist;+2 physical assistance;+2 safety/equipment;Mod assist         General transfer comment: pt needed  encouragement due to fear of not being able to get up. Mod A+2 from bed, min A +2 from stedy.  Ambulation/Gait Ambulation/Gait assistance: +2 physical assistance;+2 safety/equipment;Mod assist   Assistive device:  (stedy) Gait Pattern/deviations: Step-to pattern;Wide base of support Gait velocity: decreased   General Gait Details: pt began with wt shifting and progressed to stepping feet in place and then lifting heels one at a time after she fatigued from stepping. Maintained standing 5 mins while her favorite song played and sang along to nearly the whole song.   Stairs             Wheelchair Mobility    Modified Rankin (Stroke Patients Only)       Balance Overall balance assessment: Needs assistance Sitting-balance support: Feet supported;Bilateral upper extremity supported Sitting balance-Leahy Scale: Fair Sitting balance - Comments: pt sat with better posture today and supervision   Standing balance support: Bilateral upper extremity supported;During functional activity Standing balance-Leahy Scale: Poor Standing balance comment: min-guard to stand with BUE support, min to stand with unilateral UE support. Worked on reaching in all planes with each hand.                            Cognition Arousal/Alertness: Awake/alert Behavior During Therapy:  (pt emotional today) Overall Cognitive Status: Impaired/Different from baseline Area of Impairment: Safety/judgement;Following commands;Attention;Memory;Problem solving;Orientation                 Orientation Level: Time Current Attention Level: Selective Memory: Decreased short-term memory Following Commands: Follows one  step commands consistently;Follows one step commands with increased time   Awareness: Emergent Problem Solving: Slow processing;Requires verbal cues;Requires tactile cues General Comments: pt not complaining of pain today but is very tired. Still having difficulty with day to day  memory but could tell me her favorite song and the singer immediately. Was able to recall the events of the past few days with cues      Exercises Other Exercises Other Exercises: cervical retraction x10 with resistance Other Exercises: shoulder shrugs x10 Other Exercises: scapular retraction x10    General Comments General comments (skin integrity, edema, etc.): Wore patched glasses throughout session. Reports she has been able to watch some TV. Spoke to her sister about bringing some games such as UNO for them to play together.      Pertinent Vitals/Pain Pain Assessment: Faces Faces Pain Scale: Hurts a little bit Pain Location: neck Pain Descriptors / Indicators: Sore Pain Intervention(s): Limited activity within patient's tolerance;Monitored during session    Home Living                      Prior Function            PT Goals (current goals can now be found in the care plan section) Acute Rehab PT Goals Patient Stated Goal: be able to get up PT Goal Formulation: With patient Time For Goal Achievement: 05/18/21 Potential to Achieve Goals: Good Progress towards PT goals: Progressing toward goals    Frequency    Min 3X/week      PT Plan Current plan remains appropriate    Co-evaluation              AM-PAC PT "6 Clicks" Mobility   Outcome Measure  Help needed turning from your back to your side while in a flat bed without using bedrails?: A Lot Help needed moving from lying on your back to sitting on the side of a flat bed without using bedrails?: A Lot Help needed moving to and from a bed to a chair (including a wheelchair)?: Total Help needed standing up from a chair using your arms (e.g., wheelchair or bedside chair)?: Total Help needed to walk in hospital room?: Total Help needed climbing 3-5 steps with a railing? : Total 6 Click Score: 8    End of Session Equipment Utilized During Treatment: Gait belt Activity Tolerance: Patient tolerated  treatment well Patient left: with call bell/phone within reach;with family/visitor present;in chair Nurse Communication: Mobility status PT Visit Diagnosis: Muscle weakness (generalized) (M62.81);History of falling (Z91.81);Difficulty in walking, not elsewhere classified (R26.2)     Time: 2671-2458 PT Time Calculation (min) (ACUTE ONLY): 33 min  Charges:  $Gait Training: 8-22 mins $Therapeutic Activity: 8-22 mins                     Lyanne Co, PT  Acute Rehab Services  Pager [phone] Office [phone]    Lawana Chambers Libby Goehring 05/14/2021, 12:25 PM

## 2021-05-14 NOTE — Progress Notes (Signed)
Neurology Progress Note  Brief HPI: Kellis Heide Guile is a 20 y.o. female with PMH significant for recent Hpylori infection, intractable nausea with poor po intake since April 2022, who initially presented to OSH on 6/16 with 2.5 weeks hx of blurred vision which progressed to ataxia. 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 MRI Brain and C spine with and w/o contrast with hyperintense T2-weighted signal and mild contrast enhancement within the medial thalami and  periaqueductal gray matter. MRI Orbits with no significant abnormality. 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 There was no significant improvement with steroids so she was started on PLEX- to be complete 05/17/2021.  Subjective: Hypotension overnight with slight tachycardia. Vital signs with improvement s/p 1L IVF bolus. Patient remained asymptomatic. No other overnight events noted.  Placed on meropenem yesterday for leukocyte + urinary tract infection and worsening leukocytosis  PLEX 3/5 completed 05/13/2021 CSF IgG index elevated at 2.0, thiamine level low at 38.3  Exam: Vitals:   05/14/21 0432 05/14/21 0733  BP: (!) 89/52 (!) 105/54  Pulse: 72 72  Resp:  19  Temp: 97.6 F (36.4 C) 98.2 F (36.8 C)  SpO2: 100% 100%   Gen: Laying in bed, repositioned for neck comfort, in no acute distress Resp: non-labored breathing, no respiratory distress, on room air Abd: soft, non-distended, non-tender  Neuro: Mental Status: Awake and alert on assessment. She is oriented to person, age, month, year, and place.  Speech remains slowed and soft but is without dysarthria or aphasia. Follows commands. No neglect is noted.  Cranial Nerves: PERRL, no visual field deficits. Eyes remain dysconjugate with ophthalmoplegia and persistent limited lateral gaze. There are 3-4 beats of nystagmus present in each eye with  right and left lateral gaze with the ipsilateral eye nystagmus more pronounced-bilateral INO, left worse than right on examination this morning. Face is symmetric resting and smiling, hearing is intact to voice, tongue protrudes midline.  Motor: Patient endorses generalized weakness due to deconditioning and malnutrition. 4+/5 bilateral upper extremity strength throughout. Bilateral lower extremities 4/5 strength throughout without vertical drift on assessment. Tone and bulk are normal Sensory: Sensation to light touch intact and symmetric in upper and lower extremities Gait: Deferred  Pertinent Labs: CBC    Component Value Date/Time   WBC 11.1 (H) 05/13/2021 0650   RBC 4.74 05/13/2021 0650   HGB 13.3 05/13/2021 0650   HCT 41.5 05/13/2021 0650   PLT 212 05/13/2021 0650   MCV 87.6 05/13/2021 0650   MCH 28.1 05/13/2021 0650   MCHC 32.0 05/13/2021 0650   RDW 18.9 (H) 05/13/2021 0650   LYMPHSABS 1.6 05/13/2021 0650   MONOABS 0.8 05/13/2021 0650   EOSABS 0.1 05/13/2021 0650   BASOSABS 0.0 05/13/2021 0650   CMP     Component Value Date/Time   NA 142 05/13/2021 0650   K 3.2 (L) 05/13/2021 0650   CL 109 05/13/2021 0650   CO2 27 05/13/2021 0650   GLUCOSE 85 05/13/2021 0650   BUN 16 05/13/2021 0650   CREATININE 1.01 (H) 05/13/2021 0650   CALCIUM 8.8 (L) 05/13/2021 0650   PROT 5.0 (L) 05/13/2021 0650   ALBUMIN 3.4 (L) 05/13/2021 0650   ALBUMIN 3.8 (L) 05/09/2021 0830   AST 24 05/13/2021 0650   ALT 40 05/13/2021 0650   ALKPHOS 36 (L) 05/13/2021 0650   BILITOT 1.4 (H) 05/13/2021 0650   GFRNONAA >60  05/13/2021 0650   Urinalysis    Component Value Date/Time   COLORURINE AMBER (A) 05/13/2021 1012   APPEARANCEUR TURBID (A) 05/13/2021 1012   LABSPEC 1.013 05/13/2021 1012   PHURINE 6.0 05/13/2021 1012   GLUCOSEU NEGATIVE 05/13/2021 1012   HGBUR LARGE (A) 05/13/2021 1012   BILIRUBINUR NEGATIVE 05/13/2021 1012   KETONESUR NEGATIVE 05/13/2021 1012   PROTEINUR 100 (A) 05/13/2021  1012   NITRITE NEGATIVE 05/13/2021 1012   LEUKOCYTESUR MODERATE (A) 05/13/2021 1012   CSF studies: Glucose: 80 Protein: 45 WBC: 8 OCB: 0 IgG index: 2.0  MOG Ab is negative. Culture NO GROWTH 3 DAYS     Ref. Range 05/09/2021 08:30  Albumin CSF-mCnc Latest Ref Range: 7 - 29 mg/dL 24  IgG, CSF Latest Ref Range: 0.0 - 6.7 mg/dL 3.9  IgG/Alb Ratio, CSF Latest Ref Range: 0.00 - 0.25  0.16  CSF IgG Index Latest Ref Range: 0.0 - 0.7  2.0 (H)   AQP4 Ab is pending  Thiamine level: 38.3   Results for orders placed or performed during the hospital encounter of 05/03/21  Culture, Urine     Status: Abnormal   Collection Time: 05/07/21 11:31 PM   Specimen: Urine, Random  Result Value Ref Range Status   Specimen Description URINE, RANDOM  Final   Special Requests   Final    NONE Performed at [facility], [address], [place], [place] [address]    Culture (A)  Final    >=100,000 COLONIES/mL ESCHERICHIA COLI Confirmed Extended Spectrum Beta-Lactamase Producer (ESBL).  In bloodstream infections from ESBL organisms, carbapenems are preferred over piperacillin/tazobactam. They are shown to have a lower risk of mortality.    Report Status 05/10/2021 FINAL  Final   Organism ID, Bacteria ESCHERICHIA COLI (A)  Final      Susceptibility   Escherichia coli - MIC*    AMPICILLIN >=32 RESISTANT Resistant     CEFAZOLIN >=64 RESISTANT Resistant     CEFEPIME 2 SENSITIVE Sensitive     CEFTRIAXONE >=64 RESISTANT Resistant     CIPROFLOXACIN 0.5 SENSITIVE Sensitive     GENTAMICIN >=16 RESISTANT Resistant     IMIPENEM <=0.25 SENSITIVE Sensitive     NITROFURANTOIN <=16 SENSITIVE Sensitive     TRIMETH/SULFA >=320 RESISTANT Resistant     AMPICILLIN/SULBACTAM >=32 RESISTANT Resistant     PIP/TAZO <=4 SENSITIVE Sensitive     * >=100,000 COLONIES/mL ESCHERICHIA COLI  CSF culture w Gram Stain     Status: None   Collection Time: 05/08/21  9:21 AM   Specimen: PATH Cytology CSF; Cerebrospinal Fluid   Result Value Ref Range Status   Specimen Description CSF  Final   Special Requests NONE  Final   Gram Stain CYTOSPIN SMEAR NO WBC SEEN NO ORGANISMS SEEN   Final   Culture   Final    NO GROWTH 3 DAYS Performed at [facility], [address], [place], [place] [address]    Report Status 05/11/2021 FINAL  Final  Fungus Culture With Stain     Status: None (Preliminary result)   Collection Time: 05/08/21  9:21 AM   Specimen: PATH Cytology CSF; Cerebrospinal Fluid  Result Value Ref Range Status   Fungus Stain Final report  Final    Comment: (NOTE) Performed At: [facility] [address] [place], [place] [id number] Jolene Schimke MD VO:[id number]    Fungus (Mycology) Culture PENDING  Incomplete   Fungal Source CSF  Final    Comment: Performed at [facility], [address] [place], [place] [address]  Anaerobic culture w Gram Stain     Status: None   Collection Time: 05/08/21  9:21 AM   Specimen: PATH Cytology CSF; Cerebrospinal Fluid  Result Value Ref Range Status   Specimen Description CSF  Final   Special Requests NONE  Final   Gram Stain   Final    WBC PRESENT, PREDOMINANTLY MONONUCLEAR NO ORGANISMS SEEN CYTOSPIN SMEAR    Culture   Final    NO ANAEROBES ISOLATED Performed at [facility], [address] [place], [place] [address]    Report Status 05/13/2021 FINAL  Final  Fungus Culture Result     Status: None   Collection Time: 05/08/21  9:21 AM  Result Value Ref Range Status   Result 1 Comment  Final    Comment: (NOTE) KOH/Calcofluor preparation:  no fungus observed. Performed At: [facility] [address] [place], [place] [id number] Jolene Schimke MD DG:[id number]    Imaging Reviewed: MRI Orbits with and without contrast: 1. Normal MRI appearance of the orbits. 2. Subtle enhancement along the periaqueductal gray matter and medial thalami bilaterally. This corresponds to the abnormal signal changes on the MRI from  yesterday. In addition to a demyelinating process, or Wernicke encephalopathy (thiamine deficiency encephalopathy) is also considered.   MRI Brain with and without cotnrast(personally reviewed): 1. Hyperintense T2-weighted signal and mild contrast enhancement within the medial thalami and periaqueductal gray matter. Demyelinating disease remains a primary consideration. MRI of the orbits with and without contrast might be helpful for better characterization of the optic nerves. CSF sampling should also be considered. 2. No demyelinating lesions of the cervical or thoracic spinal cord.   MRI C and T spine with and without contrast: No demyelinating lesions of the cervical or thoracic spinal cord.  Assessment: Domonique Heide Guile is a 20 y.o. female with PMH significant for recent Hpylori infection, intractable nausea with poor po intake since April 2022, who initially presented to OSH on 6/16 with 2.5 weeks hx of blurred vision which progressed to ataxia. 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 She endorses generalized weakness from deconditioning also with a component of malnutrition with constant nausea and vomiting preceding hospitalization.  - Involvement of the medial thalami might explain decreased arousal and involvement of the periaqueductal gray might explain her nausea. - Initially treated with IV solumedrol without significant improvement. PLEX initiated 05/09/2021 to be completed 05/17/2021.  - Thiamine level low at 38.3, CSF IgG index elevated at 2.0, OCB negative.  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  Recommendations: - Completed 5/5 days of IV solumedrol without significant improvement -  PLEX, session 3/5 to be completed today with final round planned 05/17/2021 - Thiamine level low at 38.3, continue supplementation  - AQP4-Ab pending - Neurology will continue to follow  Lanae Boast, AGACNP-BC [facility] [phone]   Attending Neurohospitalist Addendum Patient seen and examined with APP/Resident. Agree with the history and physical as documented above. Agree with the plan as documented, which I helped formulate. I have independently reviewed the chart, obtained history, review of systems and examined the patient.I have personally reviewed pertinent head/neck/spine imaging (CT/MRI). Please feel free to call with any questions.  -- Milon Dikes, MD  Neurologist [facility] Pager: [phone]

## 2021-05-14 NOTE — Progress Notes (Signed)
Inpatient Rehabilitation Admissions Coordinator   I continue to follow pt' progress to assist with planning dispo when appropriate. Noted PLEX until 7/1.  Ottie Glazier, RN, MSN Rehab Admissions Coordinator [phone] 05/14/2021 2:11 PM

## 2021-05-14 NOTE — Progress Notes (Signed)
PROGRESS NOTE    Diane Todd  AVW:[id number] DOB: [birth year] DOA: 05/03/2021 PCP: Pcp, No   Brief Narrative:  Diane Todd is an 20 y.o. female past medical history of essential hypertension hypothyroidism [facility] after her MRI showed multiple brain abnormalities she has been having generalized weakness poor appetite and blurry vision.   Neurology was consulted and appreciate assistance, recommended serum for AQP4-IgG antibody pending, MOG-IgG antibody negative. Completed 5 days of high-dose steroids and 3 days of high-dose thiamine, c/w thiamine without significant improvement. Started on plasmapheresis with plans for 5 total sessions, last session will be on 7/1 Physical therapy evaluated the patient recommended inpatient rehab. 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 Physical therapy evaluated the patient recommended inpatient rehab.   Essential hypertension: Blood pressures well controlled, on metoprolol.   Acute Kidney injury  Patient presented  with a creatinine of 1.5 Is receiving IV fluids, creatinine has now improved to 1.0 Continue to follow   Hypothyroidism TSH of 0.3.   History of H. pylori: Treatment initated today 6/24 Amoxicillin 1g bid, clarithromycin 500mg  bid, ppi bid all for 14 days, day 5 of 14   Cloudy urine asx Urinalysis indicates possible infection Since she is immunosuppressed, she has been started on meropenem Urine culture in process Last urine culture from 05/07/21 was positive for ESBL E. Coli I cannot confirm if this was treated with antibiotics  Nutrition: Patient has had poor p.o. intake for months and there was consideration for cortrak placement for nutrition Within the past few days, p.o. intake has started to improve, will continue to monitor     DVT prophylaxis: SCD/Compression stockings  Code Status: full      Code Status Orders  (From admission, onward)           Start     Ordered   05/04/21 0802  Full code  Continuous        05/04/21 0801           Code Status History     Date Active Date Inactive Code Status Order ID Comments User Context   05/04/2021 0309 05/04/2021 0801 Full Code [id number]  Rica Moteollins, Hunter J, MD Inpatient      Family Communication: sister at bedside  Disposition Plan:   CIR vs SNF Consults called:  NEURO Admission status: Inpatient Status is: Inpatient   Remains inpatient appropriate because:Hemodynamically unstable   Dispo: The patient is from: Home              Anticipated d/c is to: CIR vs SNF              Patient currently is not medically stable to d/c.  Difficult to place patient No   Consultants:  AS ABOVE  Procedures:  MR BRAIN W WO CONTRAST  Result Date: 05/04/2021 CLINICAL DATA:  Weakness and blurry vision. EXAM: MRI HEAD WITH AND WITHOUT CONTRAST MRI CERVICAL AND THORACIC SPINE WITHOUT AND WITH CONTRAST TECHNIQUE: Multiplanar and multiecho pulse sequences of the head and cervical spine, to include the  craniocervical junction and cervicothoracic junction, and the thoracic spine, were obtained without and with intravenous contrast. CONTRAST:  10mL GADAVIST GADOBUTROL 1 MMOL/ML IV SOLN COMPARISON:  None. FINDINGS: MRI BRAIN FINDINGS Brain: No acute infarct, mass effect or extra-axial collection. No acute or chronic hemorrhage. There is hyperintense T2-weighted signal within the medial thalami and periaqueductal gray matter. There is mild contrast enhancement of the periaqueductal gray matter but no other enhancing lesion. The midline structures are normal. Vascular: Major flow voids are preserved. Skull and upper cervical spine: Normal calvarium and skull base. Visualized upper cervical spine and soft tissues are normal. Sinuses/Orbits:No paranasal sinus fluid levels or advanced mucosal thickening. No mastoid or middle ear effusion. Normal orbits. MRI CERVICAL SPINE FINDINGS Alignment: Physiologic. Vertebrae: No fracture, evidence of discitis, or bone lesion. Cord: Normal signal and morphology. No abnormal contrast enhancement. Posterior Fossa, vertebral arteries, paraspinal tissues: Negative. Disc levels: No spinal canal or neural foraminal stenosis. MRI THORACIC SPINE FINDINGS Alignment:  Physiologic. Vertebrae: No fracture, evidence of discitis, or bone lesion. Cord: Normal signal and morphology. No abnormal contrast enhancement Paraspinal and other soft tissues: Negative. Disc levels: T7-8: Small right subarticular disc protrusion without stenosis. T8-9: Small left subarticular disc protrusion without stenosis. The other thoracic disc levels are normal. IMPRESSION: 1. Hyperintense T2-weighted signal and mild contrast enhancement within the medial thalami and periaqueductal gray matter. Demyelinating disease remains a primary consideration. MRI of the orbits with and without contrast might be helpful for better characterization of the optic nerves. CSF sampling should also be considered. 2. No demyelinating  lesions of the cervical or thoracic spinal cord. Electronically Signed   By: Deatra Robinson M.D.   On: 05/04/2021 02:42   MR CERVICAL SPINE W WO CONTRAST  Result Date: 05/04/2021 CLINICAL DATA:  Weakness and blurry vision. EXAM: MRI HEAD WITH AND WITHOUT CONTRAST MRI CERVICAL AND THORACIC SPINE WITHOUT AND WITH CONTRAST TECHNIQUE: Multiplanar and multiecho pulse sequences of the head and cervical spine, to include the craniocervical junction and cervicothoracic junction, and the thoracic spine, were obtained without and with intravenous contrast. CONTRAST:  10mL GADAVIST GADOBUTROL 1 MMOL/ML IV SOLN COMPARISON:  None. FINDINGS: MRI BRAIN FINDINGS Brain: No acute infarct, mass effect or extra-axial collection. No acute or chronic hemorrhage. There is hyperintense T2-weighted signal within the medial thalami and periaqueductal gray matter. There is mild contrast enhancement of the periaqueductal gray matter but no other enhancing lesion. The midline structures are normal. Vascular: Major flow voids are preserved. Skull and upper cervical spine: Normal calvarium and skull base. Visualized upper cervical spine and soft tissues are normal. Sinuses/Orbits:No paranasal sinus fluid levels or advanced mucosal thickening. No mastoid or middle ear effusion. Normal orbits. MRI CERVICAL SPINE FINDINGS Alignment: Physiologic. Vertebrae: No fracture, evidence of discitis, or bone lesion. Cord: Normal signal and morphology. No abnormal contrast enhancement. Posterior Fossa, vertebral arteries, paraspinal tissues: Negative. Disc levels: No spinal canal or neural foraminal stenosis. MRI THORACIC SPINE FINDINGS Alignment:  Physiologic. Vertebrae: No fracture, evidence of discitis, or bone lesion. Cord: Normal signal and morphology. No abnormal contrast enhancement Paraspinal and other soft tissues: Negative. Disc levels: T7-8: Small right subarticular  disc protrusion without stenosis. T8-9: Small left subarticular disc protrusion  without stenosis. The other thoracic disc levels are normal. IMPRESSION: 1. Hyperintense T2-weighted signal and mild contrast enhancement within the medial thalami and periaqueductal gray matter. Demyelinating disease remains a primary consideration. MRI of the orbits with and without contrast might be helpful for better characterization of the optic nerves. CSF sampling should also be considered. 2. No demyelinating lesions of the cervical or thoracic spinal cord. Electronically Signed   By: Deatra Robinson M.D.   On: 05/04/2021 02:42   MR THORACIC SPINE W WO CONTRAST  Result Date: 05/04/2021 CLINICAL DATA:  Weakness and blurry vision. EXAM: MRI HEAD WITH AND WITHOUT CONTRAST MRI CERVICAL AND THORACIC SPINE WITHOUT AND WITH CONTRAST TECHNIQUE: Multiplanar and multiecho pulse sequences of the head and cervical spine, to include the craniocervical junction and cervicothoracic junction, and the thoracic spine, were obtained without and with intravenous contrast. CONTRAST:  105mL GADAVIST GADOBUTROL 1 MMOL/ML IV SOLN COMPARISON:  None. FINDINGS: MRI BRAIN FINDINGS Brain: No acute infarct, mass effect or extra-axial collection. No acute or chronic hemorrhage. There is hyperintense T2-weighted signal within the medial thalami and periaqueductal gray matter. There is mild contrast enhancement of the periaqueductal gray matter but no other enhancing lesion. The midline structures are normal. Vascular: Major flow voids are preserved. Skull and upper cervical spine: Normal calvarium and skull base. Visualized upper cervical spine and soft tissues are normal. Sinuses/Orbits:No paranasal sinus fluid levels or advanced mucosal thickening. No mastoid or middle ear effusion. Normal orbits. MRI CERVICAL SPINE FINDINGS Alignment: Physiologic. Vertebrae: No fracture, evidence of discitis, or bone lesion. Cord: Normal signal and morphology. No abnormal contrast enhancement. Posterior Fossa, vertebral arteries, paraspinal tissues:  Negative. Disc levels: No spinal canal or neural foraminal stenosis. MRI THORACIC SPINE FINDINGS Alignment:  Physiologic. Vertebrae: No fracture, evidence of discitis, or bone lesion. Cord: Normal signal and morphology. No abnormal contrast enhancement Paraspinal and other soft tissues: Negative. Disc levels: T7-8: Small right subarticular disc protrusion without stenosis. T8-9: Small left subarticular disc protrusion without stenosis. The other thoracic disc levels are normal. IMPRESSION: 1. Hyperintense T2-weighted signal and mild contrast enhancement within the medial thalami and periaqueductal gray matter. Demyelinating disease remains a primary consideration. MRI of the orbits with and without contrast might be helpful for better characterization of the optic nerves. CSF sampling should also be considered. 2. No demyelinating lesions of the cervical or thoracic spinal cord. Electronically Signed   By: Deatra Robinson M.D.   On: 05/04/2021 02:42   US RENAL  Result Date: 05/04/2021 CLINICAL DATA:  Chronic renal disease. EXAM: RENAL / URINARY TRACT ULTRASOUND COMPLETE COMPARISON:  CT abdomen and pelvis March 14, 2021. FINDINGS: Right Kidney: Renal measurements: 10.2 x 4.2 x 5.0 cm = volume: 112.0 mL. Mild renal cortical thinning. No hydronephrosis. Left Kidney: Renal measurements: May 0.4 x 4.5 x 3.9 cm = volume: 76.7 mL. Mild renal cortical thinning. No hydronephrosis. Bladder: Decompressed with Foley catheter. Other: None. IMPRESSION: No hydronephrosis. Electronically Signed   By: Annia Belt M.D.   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 Catheter needed for plasmapheresis. EXAM: FLUOROSCOPIC AND ULTRASOUND GUIDED PLACEMENT OF A NON-TUNNELED DIALYSIS CATHETER Physician: Rachelle Hora. Henn, MD MEDICATIONS: 1% lidocaine ANESTHESIA/SEDATION: None FLUOROSCOPY TIME:  Fluoroscopy Time: 12 seconds, 7 mGy COMPLICATIONS:  None immediate. PROCEDURE: Informed consent was obtained for catheter placement. The patient was placed supine on the interventional  table. Ultrasound confirmed a patent right internal jugular vein. Ultrasound images were obtained for documentation. The right neck was prepped and draped in a sterile fashion. The right neck was anesthetized with 1% lidocaine. Maximal barrier sterile technique was utilized including caps, mask, sterile gowns, sterile gloves, sterile drape, hand hygiene and skin antiseptic. A small incision was made with #11 blade scalpel. A 21 gauge needle directed into the right internal jugular vein with ultrasound guidance. A micropuncture dilator set was placed. A 15 cm Trialysis was selected. The catheter was advanced over a wire and positioned at the superior cavoatrial junction. Fluoroscopic images were obtained for documentation. Both dialysis lumens were found to aspirate and flush well. The proper amount of heparin was flushed in both lumens. The central venous lumen was flushed with normal saline. Catheter was sutured to skin. FINDINGS: Catheter tip at the superior cavoatrial junction. IMPRESSION: Successful placement of a right jugular non-tunneled dialysis/pheresis catheter using ultrasound and fluoroscopic guidance. Electronically Signed   By: Richarda Overlie M.D.   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 Catheter needed for plasmapheresis.   EXAM: FLUOROSCOPIC AND ULTRASOUND GUIDED PLACEMENT OF A NON-TUNNELED DIALYSIS CATHETER   Physician: Rachelle Hora. Henn, MD   MEDICATIONS: 1% lidocaine   ANESTHESIA/SEDATION: None   FLUOROSCOPY TIME:  Fluoroscopy Time: 12 seconds, 7 mGy   COMPLICATIONS: None immediate.   PROCEDURE: Informed consent was obtained for catheter placement. The patient was placed supine on the interventional table. Ultrasound confirmed a patent right internal jugular vein. Ultrasound images were obtained  for documentation. The right neck was prepped and draped in a sterile fashion. The right neck was anesthetized with 1% lidocaine. Maximal barrier sterile technique was utilized including caps, mask, sterile gowns, sterile gloves, sterile drape, hand hygiene and skin antiseptic. A small incision was made with #11 blade scalpel. A 21 gauge needle directed into the right internal jugular vein with ultrasound guidance. A micropuncture dilator set was placed. A 15 cm Trialysis was selected. The catheter was advanced over a wire and positioned at the superior cavoatrial junction. Fluoroscopic images were obtained for documentation. Both dialysis lumens were found to aspirate and flush well. The proper amount of heparin was flushed in both lumens. The central venous lumen was flushed with normal saline. Catheter was sutured to skin.   FINDINGS: Catheter tip at the superior cavoatrial junction.   IMPRESSION: Successful placement of a right jugular non-tunneled dialysis/pheresis catheter using ultrasound and fluoroscopic guidance.     Electronically Signed   By: Richarda Overlie M.D.   On: 05/08/2021 16:58    DG FL GUIDED LUMBAR PUNCTURE  Result Date: 05/08/2021 CLINICAL DATA:  Optic neuritis, altered mental status EXAM: DIAGNOSTIC LUMBAR PUNCTURE UNDER FLUOROSCOPIC GUIDANCE COMPARISON:  None FLUOROSCOPY TIME:  Fluoroscopy Time:  12 seconds Radiation Exposure Index (if provided by the fluoroscopic device): 1.5 mGy Number of Acquired Spot Images: 0 PROCEDURE: Informed consent was obtained from the patient prior to the procedure, including potential complications of headache, allergy, and pain. With the patient prone, the lower back was prepped with Betadine. 1% Lidocaine was used for local anesthesia. Lumbar puncture was performed at the L4-L5 level using a 20 gauge needle with return of clear CSF with an opening pressure of 17 cm water. Approximately 13 ml of CSF were obtained for laboratory studies. The patient tolerated the  procedure well and there were no apparent complications. IMPRESSION: Successful lumbar puncture. Approximately 13 mL  of clear CSF were obtained and sent to the laboratory for analysis. There were no immediate complications. Electronically Signed   By: Caprice Renshaw   On: 05/08/2021 09:48   MR ORBITS W WO CONTRAST  Result Date: 05/05/2021 CLINICAL DATA:  Optic neuritis suspected. EXAM: MRI OF THE ORBITS WITHOUT AND WITH CONTRAST TECHNIQUE: Multiplanar, multi-echo pulse sequences of the orbits and surrounding structures were acquired including fat saturation techniques, before and after intravenous contrast administration. CONTRAST:  10mL GADAVIST GADOBUTROL 1 MMOL/ML IV SOLN COMPARISON:  Abnormal MRI of the brain. FINDINGS: Orbits: Globes are within normal limits bilaterally. The optic nerves are unremarkable. Chiasm is within normal limits. Abnormal signal or enhancement is present. Extraocular muscles are within normal limits. Visualized sinuses: Polyp or mucous retention cyst is noted in the left maxillary sinus. The paranasal sinuses and mastoid air cells are otherwise clear. Soft tissues: Periorbital soft tissues and visualized facial soft tissues are within normal limits. 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 There is subtle enhancement along the periaqueductal gray matter and medial thalami. IMPRESSION: 1. Normal MRI appearance of the orbits. 2. Subtle enhancement along the periaqueductal gray matter and medial thalami bilaterally. This corresponds to the abnormal signal changes on the MRI from yesterday. In addition to a demyelinating process, or Wernicke encephalopathy (thiamine deficiency encephalopathy) is also considered. Electronically Signed   By: Marin Roberts M.D.   On: 05/05/2021 13:25    Antimicrobials:  ABX for hypylori day 5 of 14  Meropenem 6/27 >   Subjective: Sleeping on my arrival, but wakes up to voice.  Her p.o. intake has  improved, nausea appears to be improving.  Feels that her blurry vision is also improving  Objective: Vitals:   05/13/21 2128 05/14/21 0432 05/14/21 0733 05/14/21 1128  BP: (!) 101/52 (!) 89/52 (!) 105/54 114/65  Pulse: 93 72 72 (!) 101  Resp: Temp: 98.4 F (36.9 C) 97.6 F (36.4 C) 98.2 F (36.8 C) 98.2 F (36.8 C)  TempSrc: Temporal Temporal Oral Oral  SpO2: 99% 100% 100%   Weight:      Height:        Intake/Output Summary (Last 24 hours) at 05/14/2021 1718 Last data filed at 05/14/2021 1400 Gross per 24 hour  Intake 4356.42 ml  Output 700 ml  Net 3656.42 ml   Filed Weights   05/10/21 0351 05/10/21 1415 05/13/21 1502  Weight: 97.6 kg 96.4 kg 90.6 kg    Examination:  General exam: Alert, awake, oriented x 3 Respiratory system: Clear to auscultation. Respiratory effort normal. Cardiovascular system:RRR. No murmurs, rubs, gallops. Gastrointestinal system: Abdomen is nondistended, soft and nontender. No organomegaly or masses felt. Normal bowel sounds heard. Central nervous system: Alert and oriented. 4/5 strength in upper and lower extremities bilaterally. Disconjugate gaze. Extremities: No C/C/E, +pedal pulses Skin: No rashes, lesions or ulcers Psychiatry: Judgement and insight appear normal. Mood & affect appropriate.      Data Reviewed: I have personally reviewed following labs and imaging studies  CBC: Recent Labs  Lab 05/10/21 0559 05/11/21 0437 05/13/21 0650  WBC 9.0 15.2* 11.1*  NEUTROABS 6.4 13.3* 8.3*  HGB 13.7 15.2* 13.3  HCT 42.8 45.7 41.5  MCV 87.7 85.3 87.6  PLT 130* 205 212   Basic Metabolic Panel: Recent Labs  Lab 05/08/21 0325 05/10/21 0337 05/11/21 0437 05/13/21 0650  NA 142 139 138 142  K 4.0 4.7 3.8 3.2*  CL 113* 113* 106 109  CO2 24 23 24 27   GLUCOSE 142* 87 116* 85  BUN 20 20 16 16   CREATININE 1.00 0.93 1.02* 1.01*  CALCIUM 8.8* 7.8* 8.9 8.8*   GFR: Estimated Creatinine Clearance: 102.7 mL/min (A) (by C-G  formula based on SCr of 1.01 mg/dL (H)). Liver Function Tests: Recent Labs  Lab 05/09/21 0830 05/10/21 0337 05/13/21 0650  AST  --  33 24  ALT  --  28 40  ALKPHOS  --  20* 36*  BILITOT  --  1.2 1.4*  PROT  --  4.4* 5.0*  ALBUMIN 3.8* 3.2* 3.4*   No results for input(s): LIPASE, AMYLASE in the last 168 hours. No results for input(s): AMMONIA in the last 168 hours. Coagulation Profile: No results for input(s): INR, PROTIME in the last 168 hours. Cardiac Enzymes: No results for input(s): CKTOTAL, CKMB, CKMBINDEX, TROPONINI in the last 168 hours. BNP (last 3 results) No results for input(s): PROBNP in the last 8760 hours. HbA1C: No results for input(s): HGBA1C in the last 72 hours. CBG: No results for input(s): GLUCAP in the last 168 hours. Lipid Profile: No results for input(s): CHOL, HDL, LDLCALC, TRIG, CHOLHDL, LDLDIRECT in the last 72 hours. Thyroid Function Tests: No results for input(s): TSH, T4TOTAL, FREET4, T3FREE, THYROIDAB in the last 72 hours. Anemia Panel: No results for input(s): VITAMINB12, FOLATE, FERRITIN, TIBC, IRON, RETICCTPCT in the last 72 hours. Sepsis Labs: No results for input(s): PROCALCITON, LATICACIDVEN in the last 168 hours.  Recent Results (from the past 240 hour(s))  Culture, Urine     Status: Abnormal   Collection Time: 05/07/21 11:31 PM   Specimen: Urine, Random  Result Value Ref Range Status   Specimen Description URINE, RANDOM  Final   Special Requests   Final    NONE Performed at [facility], [address] [place], [address] [place]    Culture (A)  Final    >=100,000 COLONIES/mL ESCHERICHIA COLI Confirmed Extended Spectrum Beta-Lactamase Producer (ESBL).  In bloodstream infections from ESBL organisms, carbapenems are preferred over piperacillin/tazobactam. They are shown to have a lower risk of mortality.    Report Status 05/10/2021 FINAL  Final   Organism ID, Bacteria ESCHERICHIA COLI (A)  Final      Susceptibility    Escherichia coli - MIC*    AMPICILLIN >=32 RESISTANT Resistant     CEFAZOLIN >=64 RESISTANT Resistant     CEFEPIME 2 SENSITIVE Sensitive     CEFTRIAXONE >=64 RESISTANT Resistant     CIPROFLOXACIN 0.5 SENSITIVE Sensitive     GENTAMICIN >=16 RESISTANT Resistant     IMIPENEM <=0.25 SENSITIVE Sensitive     NITROFURANTOIN <=16 SENSITIVE Sensitive     TRIMETH/SULFA >=320 RESISTANT Resistant     AMPICILLIN/SULBACTAM >=32 RESISTANT Resistant     PIP/TAZO <=4 SENSITIVE Sensitive     * >=100,000 COLONIES/mL ESCHERICHIA COLI  CSF culture w Gram Stain     Status: None   Collection Time: 05/08/21  9:21 AM   Specimen: PATH Cytology CSF; Cerebrospinal Fluid  Result Value Ref Range Status   Specimen Description CSF  Final   Special Requests NONE  Final   Gram Stain CYTOSPIN SMEAR NO WBC SEEN NO ORGANISMS SEEN   Final   Culture   Final    NO GROWTH 3 DAYS Performed at [facility], [address], [place], [address] [place]    Report Status 05/11/2021 FINAL  Final  Fungus Culture With Stain     Status: None (Preliminary  result)   Collection Time: 05/08/21  9:21 AM   Specimen: PATH Cytology CSF; Cerebrospinal Fluid  Result Value Ref Range Status   Fungus Stain Final report  Final    Comment: (NOTE) Performed At: [facility] [address] [place], [place] [id number] Jolene Schimke MD WU:[id number]    Fungus (Mycology) Culture PENDING  Incomplete   Fungal Source CSF  Final    Comment: Performed at [facility], [address], [place], [place] [address]  Anaerobic culture w Gram Stain     Status: None   Collection Time: 05/08/21  9:21 AM   Specimen: PATH Cytology CSF; Cerebrospinal Fluid  Result Value Ref Range Status   Specimen Description CSF  Final   Special Requests NONE  Final   Gram Stain   Final    WBC PRESENT, PREDOMINANTLY MONONUCLEAR NO ORGANISMS SEEN CYTOSPIN SMEAR    Culture   Final    NO ANAEROBES ISOLATED Performed at [facility], [address], [place], [place] [address]    Report Status 05/13/2021 FINAL  Final  Fungus Culture Result     Status: None   Collection Time: 05/08/21  9:21 AM  Result Value Ref Range Status   Result 1 Comment  Final    Comment: (NOTE) KOH/Calcofluor preparation:  no fungus observed. Performed At: [facility] [address] [place], [place] [id number] Jolene Schimke MD MW:[id number]          Radiology Studies: No results found.      Scheduled Meds:  amoxicillin  1,000 mg Oral Q12H   calcium gluconate  2 g Intravenous Once   Chlorhexidine Gluconate Cloth  6 each Topical Daily   clarithromycin  500 mg Oral Q12H   docusate sodium  100 mg Oral BID   feeding supplement  1 Container Oral TID BM   levothyroxine  50 mcg Intravenous Daily   lidocaine (PF)  5 mL Intradermal Once   metoprolol succinate  25 mg Oral Daily   multivitamin with minerals  1 tablet Oral Daily   pantoprazole  40 mg Oral BID   Continuous Infusions:  anticoagulant sodium citrate     citrate dextrose     meropenem (MERREM) IV 1 g (05/14/21 0945)     LOS: 11 days    Time spent: 35  min    Erick Blinks, MD [facility]  If 7PM-7AM, please contact night-coverage  05/14/2021, 5:18 PM

## 2021-05-15 LAB — COMPREHENSIVE METABOLIC PANEL
ALT: 27 U/L (ref 0–44)
AST: 27 U/L (ref 15–41)
Albumin: 2.8 g/dL — ABNORMAL LOW (ref 3.5–5.0)
Alkaline Phosphatase: 35 U/L — ABNORMAL LOW (ref 38–126)
Anion gap: 4 — ABNORMAL LOW (ref 5–15)
BUN: 6 mg/dL (ref 6–20)
CO2: 23 mmol/L (ref 22–32)
Calcium: 8.4 mg/dL — ABNORMAL LOW (ref 8.9–10.3)
Chloride: 117 mmol/L — ABNORMAL HIGH (ref 98–111)
Creatinine, Ser: 0.81 mg/dL (ref 0.44–1.00)
GFR, Estimated: >60 mL/min (ref >60)
Glucose, Bld: 94 mg/dL (ref 70–99)
Potassium: 3.8 mmol/L (ref 3.5–5.1)
Sodium: 144 mmol/L (ref 135–145)
Total Bilirubin: 0.5 mg/dL (ref 0.3–1.2)
Total Protein: 4.1 g/dL — ABNORMAL LOW (ref 6.5–8.1)

## 2021-05-15 LAB — MAGNESIUM: Magnesium: 1.6 mg/dL — ABNORMAL LOW (ref 1.7–2.4)

## 2021-05-15 LAB — CBC
HCT: 35 % — ABNORMAL LOW (ref 36.0–46.0)
Hemoglobin: 11.3 g/dL — ABNORMAL LOW (ref 12.0–15.0)
MCH: 28.8 pg (ref 26.0–34.0)
MCHC: 32.3 g/dL (ref 30.0–36.0)
MCV: 89.1 fL (ref 80.0–100.0)
Platelets: 170 10*3/uL (ref 150–400)
RBC: 3.93 MIL/uL (ref 3.87–5.11)
RDW: 20 % — ABNORMAL HIGH (ref 11.5–15.5)
WBC: 10.7 10*3/uL — ABNORMAL HIGH (ref 4.0–10.5)
nRBC: 0 % (ref 0.0–0.2)

## 2021-05-15 MED ORDER — MAGNESIUM SULFATE 2 GM/50ML IV SOLN
2.0000 g | Freq: Once | INTRAVENOUS | Status: AC
  Administered 2021-05-15: 2 g via INTRAVENOUS
  Filled 2021-05-15: qty 50

## 2021-05-15 MED ORDER — FOSFOMYCIN TROMETHAMINE 3 G PO PACK
3.0000 g | PACK | Freq: Once | ORAL | Status: AC
  Administered 2021-05-15: 3 g via ORAL
  Filled 2021-05-15: qty 3

## 2021-05-15 MED ORDER — SODIUM CHLORIDE 0.9 % IV SOLN
1.0000 g | Freq: Three times a day (TID) | INTRAVENOUS | Status: DC
  Filled 2021-05-15 (×2): qty 1

## 2021-05-15 NOTE — Progress Notes (Addendum)
Occupational Therapy Treatment Patient Details Name: Ifrah Vest MRN: [id number] DOB: 2001-06-25 Today's Date: 05/15/2021    History of present illness 20 yo female who presents from [facility] on 05/03/21 with worsening blurry vision and difficulty walking. She has had 2 months of N&V and was dx with H pylori gastritis and treated at [facility]. 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 PMH: HTN, thyroid disorder.   OT comments  Patient continues to make progress toward patient focused OT goals.  Patient was able to perform HEP in supine and seated at edge of bed with demo, and occasional min guard for sitting balance.  The Patient was able to roll to her L side with verbal cueing with Min A of one, and the HOB elevated to 30 degrees.  Patient sat for up to 5 min for seated HEP, then performed sit to supine with cueing, and again Min A of one.  She was able to grasp the head board rails and boost herself higher in bed with no assist.  Patient to be given upper body HEP for homework to engage her, and give her something to work on with her family.  She admits to not doing much unless prompted.  She shows great potential to improve overall mobility, and thus ADL independence.  CIR continues to be recommended, by OT will continue to follow in the acute setting to maximize her functional status.    Follow Up Recommendations  CIR    Equipment Recommendations       Recommendations for Other Services      Precautions / Restrictions Precautions Precautions: Fall Restrictions Weight Bearing Restrictions: No       Mobility Bed Mobility Overal bed mobility: Needs Assistance Bed Mobility: Supine to Sit;Sidelying to Sit;Sit to Sidelying   Sidelying to sit: Min assist     Sit to sidelying: Min assist      Transfers                      Balance Overall balance assessment: Needs assistance Sitting-balance support: Feet  supported;Bilateral upper extremity supported Sitting balance-Leahy Scale: Fair                                     ADL either performed or assessed with clinical judgement   ADL   Eating/Feeding: Set up;Bed level                                           Vision Patient Visual Report: Diplopia Additional Comments: able to wear taped glass frames more.       Exercises General Exercises - Upper Extremity Shoulder Flexion: AAROM;Both;10 reps;Supine;Seated Shoulder Extension: AAROM;Both;10 reps;Seated;Supine Elbow Flexion: AAROM;Both;15 reps;Supine;Seated Elbow Extension: AAROM;Both;15 reps;Supine;Seated   Shoulder Instructions       General Comments      Pertinent Vitals/ Pain       Pain Assessment: No/denies pain Faces Pain Scale: No hurt Pain Intervention(s): Monitored during session  Frequency  Min 2X/week        Progress Toward Goals  OT Goals(current goals can now be found in the care plan section)  Progress towards OT goals: Progressing toward goals  Acute Rehab OT Goals Patient Stated Goal: move a little better OT Goal Formulation: With patient/family Time For Goal Achievement: 05/19/21 Potential to Achieve Goals: Good  Plan Discharge plan remains appropriate    Co-evaluation                 AM-PAC OT "6 Clicks" Daily Activity     Outcome Measure   Help from another person eating meals?: A Little Help from another person taking care of personal grooming?: A Little Help from another person toileting, which includes using toliet, bedpan, or urinal?: A Lot Help from another person bathing (including washing, rinsing, drying)?: A Lot Help from another person to put on and taking off regular upper body clothing?: A Lot Help from another person to put on and taking off regular lower body clothing?: Total 6 Click Score: 13    End  of Session    OT Visit Diagnosis: Unsteadiness on feet (R26.81);Muscle weakness (generalized) (M62.81);Other symptoms and signs involving cognitive function;Low vision, both eyes (H54.2)   Activity Tolerance Patient tolerated treatment well   Patient Left in bed;with call bell/phone within reach;with bed alarm set;with family/visitor present   Nurse Communication          Time: 6734-1937 OT Time Calculation (min): 18 min  Charges: OT General Charges $OT Visit: 1 Visit OT Evaluation $OT Eval Moderate Complexity: 1 Mod OT Treatments $Therapeutic Activity: 8-22 mins  05/15/2021  Rich, OTR/L  Acute Rehabilitation Services  Office:  [phone]    Suzanna Obey 05/15/2021, 4:06 PM

## 2021-05-15 NOTE — Progress Notes (Signed)
Inpatient Rehabilitation Admissions Coordinator   I will begin insurance Auth with [place] medicaid [employer] for possible admit Friday after completion of PLEX pending insurance approval. I spoke with pt's sister, by phone at pt's bedside and she is in agreement to this plan.  Ottie Glazier, RN, MSN Rehab Admissions Coordinator [phone] 05/15/2021 10:51 AM

## 2021-05-15 NOTE — Progress Notes (Signed)
PROGRESS NOTE    AmeLie Hollars  QTM:[id number] DOB: Jul 04, 2001 DOA: 05/03/2021 PCP: Pcp, No   Brief Narrative:  Cathyrn Deas is an 20 y.o. female past medical history of essential hypertension, hypothyroidism transferred from [facility] for concern after her MRI showed multiple brain abnormalities and she had been having generalized weakness poor appetite and blurry vision.   Neurology was consulted and appreciate assistance, recommended serum for AQP4-IgG antibody pending, MOG-IgG antibody negative. Completed 5 days of high-dose steroids and 3 days of high-dose thiamine, c/w thiamine without significant improvement. Started on plasmapheresis with plans for 5 total sessions, last session will be on 7/1 (this will be clarified by neurology today) Physical therapy evaluated the patient recommended inpatient rehab. 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  I discussed with Dr.Arora 6/29 who will clarify if the last dose is 6/30 or 7/1. Pregnancy test negative. Physical therapy evaluated the patient recommended  inpatient rehab.  CIR has initiated insurance authorization with [place] [employer] for possible admission 7/1 pending insurance approval. Speech therapy evaluated and recommend regular diet.   Essential hypertension: Blood pressures well controlled, on metoprolol.   Acute Kidney injury  Patient presented with a creatinine of 1.5 Resolved.   Hypothyroidism TSH of 0.3.   History of H. pylori: Treatment initated today 6/24 Amoxicillin 1g bid, clarithromycin 500mg  bid, ppi bid all for 14 days, day 6 of 14   Cloudy urine, asymptomatic Urinalysis indicates possible infection Since she is immunosuppressed, she was started on meropenem Urine culture in process Last urine culture from 05/07/21 was positive for ESBL E. Coli Prior hospitalist MD could not confirm if this was treated with antibiotics As per communication with ID pharmacist 6/29, felt appropriate to change from meropenem to a dose of fosfomycin-completed treatment course  Nutrition Status: Nutrition Problem: Severe Malnutrition Etiology: acute illness Signs/Symptoms: energy intake < or equal to 50% for > or equal to 1 month, percent weight loss Percent weight loss: 17 % Interventions: Tube feeding, Prostat Patient has had poor p.o. intake for months and there was consideration for cortrak placement for nutrition Within the past few days, p.o. intake has started to improve, will continue to monitor  Acute urinary retention Foley catheter placed early on in admission.  Plasmaphoresis team confirmed no need for Foley catheter for them.  DC Foley catheter 6/29 and monitor for voiding.  Hypomagnesemia: Replace IV and follow.  Body mass index is 31.28 kg/m./Obesity      DVT prophylaxis: SCD/Compression stockings  Code Status: Full Family Communication: Discussed in detail with sister at bedside, updated care and answered all questions. Disposition Plan:    CIR.  Consults called:  NEURO Admission status:  Inpatient Status is:  Inpatient   Remains inpatient appropriate because: Ongoing plasmaphoresis.   Dispo: The patient is from: Home              Anticipated d/c is to: CIR              Patient currently is not medically stable to d/c.              Difficult to place patient No    Procedures:  MR BRAIN W WO CONTRAST  Result Date: 05/04/2021 CLINICAL DATA:  Weakness and blurry vision. EXAM: MRI HEAD WITH AND WITHOUT CONTRAST MRI CERVICAL AND THORACIC SPINE WITHOUT AND WITH CONTRAST TECHNIQUE: Multiplanar and multiecho pulse sequences of the head and cervical spine, to include the craniocervical junction and cervicothoracic junction, and the thoracic spine, were obtained without and with intravenous contrast. CONTRAST:  24mL GADAVIST GADOBUTROL 1 MMOL/ML IV SOLN COMPARISON:  None. FINDINGS: MRI BRAIN FINDINGS Brain: No acute infarct, mass effect or extra-axial collection. No acute or chronic hemorrhage. There is hyperintense T2-weighted signal within the medial thalami and periaqueductal gray matter. There is mild contrast enhancement of the periaqueductal gray matter but no other enhancing lesion. The midline structures are normal. Vascular: Major flow voids are preserved. Skull and upper cervical spine: Normal calvarium and skull base. Visualized upper cervical spine and soft tissues are normal. Sinuses/Orbits:No paranasal sinus fluid levels or advanced mucosal thickening. No mastoid or middle ear effusion. Normal orbits. MRI CERVICAL SPINE FINDINGS Alignment: Physiologic. Vertebrae: No fracture, evidence of discitis, or bone lesion. Cord: Normal signal and morphology. No abnormal contrast enhancement. Posterior Fossa, vertebral arteries, paraspinal tissues: Negative. Disc levels: No spinal canal or neural foraminal stenosis. MRI THORACIC SPINE FINDINGS Alignment:  Physiologic. Vertebrae: No fracture, evidence of discitis, or bone lesion. Cord: Normal signal and morphology. No abnormal contrast  enhancement Paraspinal and other soft tissues: Negative. Disc levels: T7-8: Small right subarticular disc protrusion without stenosis. T8-9: Small left subarticular disc protrusion without stenosis. The other thoracic disc levels are normal. IMPRESSION: 1. Hyperintense T2-weighted signal and mild contrast enhancement within the medial thalami and periaqueductal gray matter. Demyelinating disease remains a primary consideration. MRI of the orbits with and without contrast might be helpful for better characterization of the optic nerves. CSF sampling should also be considered. 2. No demyelinating lesions of the cervical or thoracic spinal cord. Electronically Signed   By: Deatra Robinson M.D.   On: 05/04/2021 02:42   MR CERVICAL SPINE W WO CONTRAST  Result Date: 05/04/2021 CLINICAL DATA:  Weakness and blurry vision. EXAM: MRI HEAD WITH AND WITHOUT CONTRAST MRI CERVICAL AND THORACIC SPINE WITHOUT AND WITH CONTRAST TECHNIQUE: Multiplanar and multiecho pulse sequences of the head and cervical spine, to include the craniocervical junction and cervicothoracic junction, and the thoracic spine, were obtained without and with intravenous contrast. CONTRAST:  4mL GADAVIST GADOBUTROL 1 MMOL/ML IV SOLN COMPARISON:  None. FINDINGS: MRI BRAIN FINDINGS Brain: No acute infarct, mass effect or extra-axial collection. No acute or chronic hemorrhage. There is hyperintense T2-weighted signal within the medial thalami and periaqueductal gray matter. There is mild contrast enhancement of the periaqueductal gray matter but no other enhancing lesion. The midline structures are normal. Vascular: Major flow voids are preserved. Skull and upper cervical spine: Normal calvarium and skull base. Visualized upper cervical spine and soft tissues are normal. Sinuses/Orbits:No paranasal sinus fluid levels or advanced mucosal thickening. No mastoid or middle ear effusion. Normal orbits. MRI CERVICAL SPINE FINDINGS Alignment: Physiologic. Vertebrae:  No fracture, evidence of discitis,  or bone lesion. Cord: Normal signal and morphology. No abnormal contrast enhancement. Posterior Fossa, vertebral arteries, paraspinal tissues: Negative. Disc levels: No spinal canal or neural foraminal stenosis. MRI THORACIC SPINE FINDINGS Alignment:  Physiologic. Vertebrae: No fracture, evidence of discitis, or bone lesion. Cord: Normal signal and morphology. No abnormal contrast enhancement Paraspinal and other soft tissues: Negative. Disc levels: T7-8: Small right subarticular disc protrusion without stenosis. T8-9: Small left subarticular disc protrusion without stenosis. The other thoracic disc levels are normal. IMPRESSION: 1. Hyperintense T2-weighted signal and mild contrast enhancement within the medial thalami and periaqueductal gray matter. Demyelinating disease remains a primary consideration. MRI of the orbits with and without contrast might be helpful for better characterization of the optic nerves. CSF sampling should also be considered. 2. No demyelinating lesions of the cervical or thoracic spinal cord. Electronically Signed   By: Deatra RobinsonKevin  Herman M.D.   On: 05/04/2021 02:42   MR THORACIC SPINE W WO CONTRAST  Result Date: 05/04/2021 CLINICAL DATA:  Weakness and blurry vision. EXAM: MRI HEAD WITH AND WITHOUT CONTRAST MRI CERVICAL AND THORACIC SPINE WITHOUT AND WITH CONTRAST TECHNIQUE: Multiplanar and multiecho pulse sequences of the head and cervical spine, to include the craniocervical junction and cervicothoracic junction, and the thoracic spine, were obtained without and with intravenous contrast. CONTRAST:  10mL GADAVIST GADOBUTROL 1 MMOL/ML IV SOLN COMPARISON:  None. FINDINGS: MRI BRAIN FINDINGS Brain: No acute infarct, mass effect or extra-axial collection. No acute or chronic hemorrhage. There is hyperintense T2-weighted signal within the medial thalami and periaqueductal gray matter. There is mild contrast enhancement of the periaqueductal gray matter but no  other enhancing lesion. The midline structures are normal. Vascular: Major flow voids are preserved. Skull and upper cervical spine: Normal calvarium and skull base. Visualized upper cervical spine and soft tissues are normal. Sinuses/Orbits:No paranasal sinus fluid levels or advanced mucosal thickening. No mastoid or middle ear effusion. Normal orbits. MRI CERVICAL SPINE FINDINGS Alignment: Physiologic. Vertebrae: No fracture, evidence of discitis, or bone lesion. Cord: Normal signal and morphology. No abnormal contrast enhancement. Posterior Fossa, vertebral arteries, paraspinal tissues: Negative. Disc levels: No spinal canal or neural foraminal stenosis. MRI THORACIC SPINE FINDINGS Alignment:  Physiologic. Vertebrae: No fracture, evidence of discitis, or bone lesion. Cord: Normal signal and morphology. No abnormal contrast enhancement Paraspinal and other soft tissues: Negative. Disc levels: T7-8: Small right subarticular disc protrusion without stenosis. T8-9: Small left subarticular disc protrusion without stenosis. The other thoracic disc levels are normal. IMPRESSION: 1. Hyperintense T2-weighted signal and mild contrast enhancement within the medial thalami and periaqueductal gray matter. Demyelinating disease remains a primary consideration. MRI of the orbits with and without contrast might be helpful for better characterization of the optic nerves. CSF sampling should also be considered. 2. No demyelinating lesions of the cervical or thoracic spinal cord. Electronically Signed   By: Deatra RobinsonKevin  Herman M.D.   On: 05/04/2021 02:42   US RENAL  Result Date: 05/04/2021 CLINICAL DATA:  Chronic renal disease. EXAM: RENAL / URINARY TRACT ULTRASOUND COMPLETE COMPARISON:  CT abdomen and pelvis March 14, 2021. FINDINGS: Right Kidney: Renal measurements: 10.2 x 4.2 x 5.0 cm = volume: 112.0 mL. Mild renal cortical thinning. No hydronephrosis. Left Kidney: Renal measurements: May 0.4 x 4.5 x 3.9 cm = volume: 76.7 mL. Mild  renal cortical thinning. No hydronephrosis. Bladder: Decompressed with Foley catheter. Other: None. IMPRESSION: No hydronephrosis. Electronically Signed   By: Annia Beltrew  Davis M.D.   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 Catheter needed for plasmapheresis. EXAM: FLUOROSCOPIC AND ULTRASOUND GUIDED PLACEMENT OF A NON-TUNNELED DIALYSIS CATHETER Physician: Rachelle Hora. Henn, MD MEDICATIONS: 1% lidocaine ANESTHESIA/SEDATION: None FLUOROSCOPY TIME:  Fluoroscopy Time: 12 seconds, 7 mGy COMPLICATIONS: None immediate. PROCEDURE: Informed consent was obtained for catheter placement. The patient was placed supine on the interventional table. Ultrasound confirmed a patent right internal jugular vein. Ultrasound images were obtained for documentation. The right neck was prepped and draped in a sterile fashion. The right neck was anesthetized with 1% lidocaine. Maximal barrier sterile technique was utilized including caps, mask, sterile gowns, sterile gloves, sterile drape, hand hygiene and skin antiseptic. A small incision was made with #11 blade scalpel. A 21 gauge needle directed into the right internal jugular vein with ultrasound guidance. A micropuncture dilator set was placed. A 15 cm Trialysis was selected. The catheter was advanced over a wire and positioned at the superior cavoatrial junction. Fluoroscopic images were obtained for documentation. Both dialysis lumens were found to aspirate and flush well. The proper amount of heparin was flushed in both lumens. The central venous lumen was flushed with normal saline. Catheter was sutured to skin. FINDINGS: Catheter tip at the superior cavoatrial junction. IMPRESSION: Successful placement of a right jugular non-tunneled dialysis/pheresis catheter using ultrasound and fluoroscopic guidance. Electronically Signed   By: Richarda Overlie M.D.   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 Catheter needed for plasmapheresis.   EXAM: FLUOROSCOPIC AND ULTRASOUND GUIDED PLACEMENT OF A NON-TUNNELED DIALYSIS CATHETER   Physician: Rachelle Hora. Henn, MD   MEDICATIONS: 1% lidocaine   ANESTHESIA/SEDATION: None   FLUOROSCOPY TIME:  Fluoroscopy Time: 12 seconds, 7 mGy   COMPLICATIONS: None immediate.   PROCEDURE: Informed consent was obtained for catheter placement. The patient was placed supine on the interventional table. Ultrasound confirmed a patent right internal jugular vein. Ultrasound images were obtained for documentation. The right neck was prepped and draped in a sterile fashion. The right neck was anesthetized with 1% lidocaine. Maximal barrier sterile technique was utilized including caps, mask, sterile gowns, sterile gloves, sterile drape, hand hygiene and skin antiseptic. A small incision was made with #11 blade scalpel. A 21 gauge needle directed into the right internal jugular vein with ultrasound guidance. A micropuncture dilator set was placed. A 15 cm Trialysis was selected. The catheter was advanced over a wire and positioned at the superior cavoatrial junction. Fluoroscopic images were obtained for documentation. Both dialysis lumens were found to aspirate and flush well. The proper amount of heparin was flushed in both lumens. The central venous lumen was flushed with normal saline. Catheter was sutured to skin.   FINDINGS: Catheter tip at the superior cavoatrial junction.   IMPRESSION: Successful placement of a right jugular non-tunneled dialysis/pheresis catheter using ultrasound and fluoroscopic guidance.     Electronically Signed   By: Richarda Overlie M.D.   On: 05/08/2021 16:58    DG FL GUIDED LUMBAR PUNCTURE  Result Date: 05/08/2021 CLINICAL DATA:  Optic neuritis, altered mental status EXAM: DIAGNOSTIC LUMBAR PUNCTURE UNDER FLUOROSCOPIC GUIDANCE COMPARISON:  None FLUOROSCOPY TIME:  Fluoroscopy Time:  12 seconds  Radiation Exposure Index (if provided by the fluoroscopic device): 1.5 mGy Number of Acquired Spot Images: 0 PROCEDURE: Informed consent was obtained from the patient prior to the procedure, including potential complications of headache, allergy, and pain. With the patient prone, the lower back was prepped  with Betadine. 1% Lidocaine was used for local anesthesia. Lumbar puncture was performed at the L4-L5 level using a 20 gauge needle with return of clear CSF with an opening pressure of 17 cm water. Approximately 13 ml of CSF were obtained for laboratory studies. The patient tolerated the procedure well and there were no apparent complications. IMPRESSION: Successful lumbar puncture. Approximately 13 mL of clear CSF were obtained and sent to the laboratory for analysis. There were no immediate complications. Electronically Signed   By: Caprice Renshaw   On: 05/08/2021 09:48   MR ORBITS W WO CONTRAST  Result Date: 05/05/2021 CLINICAL DATA:  Optic neuritis suspected. EXAM: MRI OF THE ORBITS WITHOUT AND WITH CONTRAST TECHNIQUE: Multiplanar, multi-echo pulse sequences of the orbits and surrounding structures were acquired including fat saturation techniques, before and after intravenous contrast administration. CONTRAST:  10mL GADAVIST GADOBUTROL 1 MMOL/ML IV SOLN COMPARISON:  Abnormal MRI of the brain. FINDINGS: Orbits: Globes are within normal limits bilaterally. The optic nerves are unremarkable. Chiasm is within normal limits. Abnormal signal or enhancement is present. Extraocular muscles are within normal limits. Visualized sinuses: Polyp or mucous retention cyst is noted in the left maxillary sinus. The paranasal sinuses and mastoid air cells are otherwise clear. Soft tissues: Periorbital soft tissues and visualized facial soft tissues are within normal limits. 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 There is subtle enhancement along the periaqueductal gray matter and  medial thalami. IMPRESSION: 1. Normal MRI appearance of the orbits. 2. Subtle enhancement along the periaqueductal gray matter and medial thalami bilaterally. This corresponds to the abnormal signal changes on the MRI from yesterday. In addition to a demyelinating process, or Wernicke encephalopathy (thiamine deficiency encephalopathy) is also considered. Electronically Signed   By: Marin Roberts M.D.   On: 05/05/2021 13:25    Antimicrobials:  ABX for hypylori day 6 of 14  Meropenem 6/27 > 6/29 Fosfomycin x1 dose on 6/29.   Subjective: Patient reports that her right eye is back to normal without blurring vision or diplopia.  Left eye with mild blurring and no diplopia.  Both improved since admission but she cannot recollect much from admission.  States that she has only been able to stand at bedside with PT and has not ambulated.  Has been sitting up in reclining chair.  Objective: Vitals:   05/15/21 0400 05/15/21 0835 05/15/21 1000 05/15/21 1352  BP: (!) 101/59 122/72 121/71 113/69  Pulse: 84 73 (!) 103 94  Resp: Temp: 98 F (36.7 C) 98.7 F (37.1 C)  98.4 F (36.9 C)  TempSrc: Temporal Oral  Oral  SpO2: 99% 99%  100%  Weight:      Height:        Intake/Output Summary (Last 24 hours) at 05/15/2021 1519 Last data filed at 05/15/2021 1500 Gross per 24 hour  Intake 1059.13 ml  Output 2350 ml  Net -1290.87 ml   Filed Weights   05/10/21 0351 05/10/21 1415 05/13/21 1502  Weight: 97.6 kg 96.4 kg 90.6 kg    Examination:  General exam: Pleasant young female, moderately built and obese lying comfortably propped up in bed without distress.  Has eyeglasses on with left side taped. Respiratory system: Clear to auscultation. Respiratory effort normal. Cardiovascular system: S1 and S2 heard, RRR.  No JVD, murmurs or pedal edema.  Telemetry personally reviewed: Sinus rhythm.  Occasional mild ST. Gastrointestinal system: Abdomen is nondistended, soft and nontender. No  organomegaly or masses felt.  Normal bowel sounds heard. Central nervous system: Alert and oriented.  Mild dish conjugate gaze but otherwise no cranial nerve deficits. Extremities: No C/C/E, +pedal pulses.  Symmetric 5 x 5 power. Skin: No rashes, lesions or ulcers Psychiatry: Judgement and insight appear normal. Mood & affect flat     Data Reviewed: I have personally reviewed following labs and imaging studies  CBC: Recent Labs  Lab 05/10/21 0559 05/11/21 0437 05/13/21 0650 05/14/21 1539 05/15/21 0659  WBC 9.0 15.2* 11.1* 14.3* 10.7*  NEUTROABS 6.4 13.3* 8.3*  --   --   HGB 13.7 15.2* 13.3 13.1 11.3*  HCT 42.8 45.7 41.5 40.2 35.0*  MCV 87.7 85.3 87.6 88.5 89.1  PLT 130* 205 212 203 170   Basic Metabolic Panel: Recent Labs  Lab 05/10/21 0337 05/11/21 0437 05/13/21 0650 05/14/21 1539 05/15/21 0659  NA 139 138 142 143 144  K 4.7 3.8 3.2* 2.9* 3.8  CL 113* 106 109 115* 117*  CO2 GLUCOSE 87 116* 85 130* 94  BUN CREATININE 0.93 1.02* 1.01* 0.88 0.81  CALCIUM 7.8* 8.9 8.8* 8.2* 8.4*  MG  --   --   --   --  1.6*   GFR: Estimated Creatinine Clearance: 128 mL/min (by C-G formula based on SCr of 0.81 mg/dL). Liver Function Tests: Recent Labs  Lab 05/09/21 0830 05/10/21 0337 05/13/21 0650 05/15/21 0659  AST  --  33 24 27  ALT  --  28 40 27  ALKPHOS  --  20* 36* 35*  BILITOT  --  1.2 1.4* 0.5  PROT  --  4.4* 5.0* 4.1*  ALBUMIN 3.8* 3.2* 3.4* 2.8*     Recent Results (from the past 240 hour(s))  Culture, Urine     Status: Abnormal   Collection Time: 05/07/21 11:31 PM   Specimen: Urine, Random  Result Value Ref Range Status   Specimen Description URINE, RANDOM  Final   Special Requests   Final    NONE Performed at [facility], [address] [place], [place] [address]    Culture (A)  Final    >=100,000 COLONIES/mL ESCHERICHIA COLI Confirmed Extended Spectrum Beta-Lactamase Producer (ESBL).  In bloodstream infections from  ESBL organisms, carbapenems are preferred over piperacillin/tazobactam. They are shown to have a lower risk of mortality.    Report Status 05/10/2021 FINAL  Final   Organism ID, Bacteria ESCHERICHIA COLI (A)  Final      Susceptibility   Escherichia coli - MIC*    AMPICILLIN >=32 RESISTANT Resistant     CEFAZOLIN >=64 RESISTANT Resistant     CEFEPIME 2 SENSITIVE Sensitive     CEFTRIAXONE >=64 RESISTANT Resistant     CIPROFLOXACIN 0.5 SENSITIVE Sensitive     GENTAMICIN >=16 RESISTANT Resistant     IMIPENEM <=0.25 SENSITIVE Sensitive     NITROFURANTOIN <=16 SENSITIVE Sensitive     TRIMETH/SULFA >=320 RESISTANT Resistant     AMPICILLIN/SULBACTAM >=32 RESISTANT Resistant     PIP/TAZO <=4 SENSITIVE Sensitive     * >=100,000 COLONIES/mL ESCHERICHIA COLI  CSF culture w Gram Stain     Status: None   Collection Time: 05/08/21  9:21 AM   Specimen: PATH Cytology CSF; Cerebrospinal Fluid  Result Value Ref Range Status   Specimen Description CSF  Final   Special Requests NONE  Final   Gram Stain CYTOSPIN SMEAR NO WBC SEEN NO ORGANISMS SEEN   Final   Culture  Final    NO GROWTH 3 DAYS Performed at [facility], [address], [place], [place] [address]    Report Status 05/11/2021 FINAL  Final  Fungus Culture With Stain     Status: None (Preliminary result)   Collection Time: 05/08/21  9:21 AM   Specimen: PATH Cytology CSF; Cerebrospinal Fluid  Result Value Ref Range Status   Fungus Stain Final report  Final    Comment: (NOTE) Performed At: [facility] [address] [place], [place] [id number] Jolene Schimke MD VW:[id number]    Fungus (Mycology) Culture PENDING  Incomplete   Fungal Source CSF  Final    Comment: Performed at [facility], [address], [place], [place] [address]  Anaerobic culture w Gram Stain     Status: None   Collection Time: 05/08/21  9:21 AM   Specimen: PATH Cytology CSF; Cerebrospinal Fluid  Result Value Ref Range Status   Specimen  Description CSF  Final   Special Requests NONE  Final   Gram Stain   Final    WBC PRESENT, PREDOMINANTLY MONONUCLEAR NO ORGANISMS SEEN CYTOSPIN SMEAR    Culture   Final    NO ANAEROBES ISOLATED Performed at [facility], [address], [place], [place] [address]    Report Status 05/13/2021 FINAL  Final  Fungus Culture Result     Status: None   Collection Time: 05/08/21  9:21 AM  Result Value Ref Range Status   Result 1 Comment  Final    Comment: (NOTE) KOH/Calcofluor preparation:  no fungus observed. Performed At: [facility] [address] [place], [place] [id number] Jolene Schimke MD IX:[id number]          Radiology Studies: No results found.      Scheduled Meds:  amoxicillin  1,000 mg Oral Q12H   calcium gluconate  2 g Intravenous Once   Chlorhexidine Gluconate Cloth  6 each Topical Daily   clarithromycin  500 mg Oral Q12H   docusate sodium  100 mg Oral BID   feeding supplement  1 Container Oral TID BM   levothyroxine  50 mcg Intravenous Daily   metoprolol succinate  25 mg Oral Daily   multivitamin with minerals  1 tablet Oral Daily   pantoprazole  40 mg Oral BID   Continuous Infusions:  anticoagulant sodium citrate     citrate dextrose       LOS: 12 days    Time spent: 35  min  Marcellus Scott, MD, [place], [facility]. [facility]  To contact the attending provider between 7A-7P or the covering provider during after hours 7P-7A, please log into the web site [email or web address] and access using universal [place] password for that web site. If you do not have the password, please call the hospital operator.

## 2021-05-15 NOTE — Progress Notes (Signed)
  Speech Language Pathology Treatment: Dysphagia  Patient Details Name: Diane Todd MRN: [id number] DOB: Jun 28, 2001 Today's Date: 05/15/2021 Time: 7619-5093 SLP Time Calculation (min) (ACUTE ONLY): 22 min  Assessment / Plan / Recommendation Clinical Impression  Pt has been continuing to increase amount of PO intake. RN reported and pt also reported difficulty swallowing pills today which has made her feel discouraged.  Pt was able to take medications crushed with puree where permissible. Today pt consumed around half a muffin independently with no clinical s/s of aspiration.  Sister reports good PO intake.  Pt has been eating outside food ([employer], [employer]) without difficulty and these foods have been appealing to her.  Pt had meal tray on table on SLP arrival and majority of meal had been consumed.    Recommend advancing pt to regular diet.  Pt has no further ST needs.  SLP will sign off at this time.    HPI HPI: Diane Todd is a 20 y.o. 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  Per review of chart patient has been to the [facility] ED multiple times over the past 2 months for range of issues.  Over this time she has consistently endorsed chronic nausea and vomiting.  Has also had work-up for gastritis and found to have H. pylori.  Also noted to have baseline creatinine of around 1.5 with unclear chronicity.  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  MRI of the brain was showing hyperintense T2-weighted signal and mild contrast enhancement within the medial thalami and periaqueductal gray matter concering for demyelinating disease.      SLP Plan  All goals met        Recommendations  Diet recommendations: Regular Liquids provided via: Cup;Straw Medication Administration:  (as tolerated) Supervision: Staff to assist with self feeding Compensations: Slow rate;Small sips/bites Postural Changes and/or Swallow Maneuvers: Seated upright 90 degrees                Oral Care Recommendations: Oral care BID Follow up Recommendations: Inpatient Rehab SLP Visit Diagnosis: Dysphagia, unspecified (R13.10) Plan: All goals met       [place], [place], [place] Office: [phone] 05/15/2021, 12:31 PM

## 2021-05-16 LAB — CBC
HCT: 35.4 % — ABNORMAL LOW (ref 36.0–46.0)
Hemoglobin: 11.2 g/dL — ABNORMAL LOW (ref 12.0–15.0)
MCH: 28.6 pg (ref 26.0–34.0)
MCHC: 31.6 g/dL (ref 30.0–36.0)
MCV: 90.3 fL (ref 80.0–100.0)
Platelets: 190 10*3/uL (ref 150–400)
RBC: 3.92 MIL/uL (ref 3.87–5.11)
RDW: 20.6 % — ABNORMAL HIGH (ref 11.5–15.5)
WBC: 8.9 10*3/uL (ref 4.0–10.5)
nRBC: 0 % (ref 0.0–0.2)

## 2021-05-16 LAB — BASIC METABOLIC PANEL
Anion gap: 6 (ref 5–15)
BUN: 6 mg/dL (ref 6–20)
CO2: 25 mmol/L (ref 22–32)
Calcium: 8.4 mg/dL — ABNORMAL LOW (ref 8.9–10.3)
Chloride: 112 mmol/L — ABNORMAL HIGH (ref 98–111)
Creatinine, Ser: 0.76 mg/dL (ref 0.44–1.00)
GFR, Estimated: >60 mL/min (ref >60)
Glucose, Bld: 94 mg/dL (ref 70–99)
Potassium: 3.4 mmol/L — ABNORMAL LOW (ref 3.5–5.1)
Sodium: 143 mmol/L (ref 135–145)

## 2021-05-16 LAB — MAGNESIUM: Magnesium: 2.1 mg/dL (ref 1.7–2.4)

## 2021-05-16 MED ORDER — ACD FORMULA A 0.73-2.45-2.2 GM/100ML VI SOLN: 1000.0000 mL | Status: DC

## 2021-05-16 MED ORDER — ANTICOAGULANT SODIUM CITRATE 4% (200MG/5ML) IV SOLN
5.0000 mL | Status: DC
  Filled 2021-05-16: qty 5

## 2021-05-16 MED ORDER — CALCIUM CARBONATE ANTACID 500 MG PO CHEW
CHEWABLE_TABLET | ORAL | Status: AC
  Filled 2021-05-16: qty 2

## 2021-05-16 MED ORDER — SODIUM CHLORIDE 0.9 % IV SOLN
INTRAVENOUS | Status: AC
  Filled 2021-05-16 (×3): qty 200

## 2021-05-16 MED ORDER — DIPHENHYDRAMINE HCL 25 MG PO CAPS: 25.0000 mg | ORAL_CAPSULE | Freq: Four times a day (QID) | ORAL | Status: DC | PRN

## 2021-05-16 MED ORDER — POTASSIUM CHLORIDE CRYS ER 20 MEQ PO TBCR
40.0000 meq | EXTENDED_RELEASE_TABLET | Freq: Once | ORAL | Status: AC
  Administered 2021-05-16: 40 meq via ORAL
  Filled 2021-05-16: qty 2

## 2021-05-16 MED ORDER — CALCIUM GLUCONATE-NACL 2-0.675 GM/100ML-% IV SOLN
2.0000 g | INTRAVENOUS | Status: AC
  Filled 2021-05-16: qty 100

## 2021-05-16 MED ORDER — AMOXICILLIN 250 MG/5ML PO SUSR
1000.0000 mg | Freq: Two times a day (BID) | ORAL | Status: DC
  Administered 2021-05-16 – 2021-05-17 (×3): 1000 mg via ORAL
  Filled 2021-05-16 (×5): qty 20

## 2021-05-16 MED ORDER — ACETAMINOPHEN 325 MG PO TABS: 650.0000 mg | ORAL_TABLET | ORAL | Status: DC | PRN

## 2021-05-16 MED ORDER — CALCIUM GLUCONATE-NACL 2-0.675 GM/100ML-% IV SOLN
INTRAVENOUS | Status: AC
  Administered 2021-05-16: 2000 mg via INTRAVENOUS
  Filled 2021-05-16: qty 100

## 2021-05-16 MED ORDER — ACD FORMULA A 0.73-2.45-2.2 GM/100ML VI SOLN
Status: AC
  Administered 2021-05-16: 1000 mL
  Filled 2021-05-16: qty 500

## 2021-05-16 MED ORDER — CALCIUM CARBONATE ANTACID 500 MG PO CHEW: 2.0000 | CHEWABLE_TABLET | ORAL | Status: DC

## 2021-05-16 NOTE — H&P (Signed)
Physical Medicine and Rehabilitation Admission H&P     HPI: Roshanna Heide Guile is a 20 year old right-handed female with history of hypertension, tobacco use, CKD stage III,  as well as thyroid disorder.  Per chart review patient lives with parent.  Independent prior to admission.  1 level home 5 steps to entry.  Prior to 4/22 was working at [employer] going to [employer]  Presented 05/03/2021 with generalized weakness poor appetite/nausea and vomiting as well as blurred vision x2 weeks.  Admission chemistries BUN 22 creatinine 1.65, sedimentation rate of 1, hemoglobin 18.8, RBC 6.71, TSH 5.224.  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  MRI of the brain showed hyperintense T2 weighted signal and mild contrast-enhancement within the medial thalami and periaqueductal gray matter.  Demyelinating disease remained a primary consideration.  MRI of the orbits with and without contrast were considered.  CSF sampling also considered.  No demyelinating lesion of the cervical thoracic cord noted.  MRI of the orbits with and without contrast showed normal appearance of the orbits.  MRI cervical thoracic spine showed T7-8 small right subarticular disc protrusion without stenosis.  T8-9 small left subarticular disc protrusion without stenosis.  Renal ultrasound showed no hydronephrosis.  Neurology follow-up work-up for NMO recommended serum for AQ P4-IgG antibody pending, MOG-IgG antibody negative.  Lumbar puncture completed 05/08/2021-unremarkable no growth.  She did complete a 5-day course of high-dose steroids and 3 days of high-dose thiamine.  Started on plasmapheresis with plans of 5 total sessions last session 05/17/2021.  Ophthalmology was consulted Dr. Luciana Axe with a funduscopic exam completed that showed papillary retinal edema appeared concerning for elevated ICP and they recommended no further intervention at this time given  minimal fundus findings they did recommend a follow-up neuro-ophthalmology at a tertiary center.  Patient with history of H. pylori treatment initiated 05/10/2021 with amoxicillin/Biaxin twice daily, PPI twice daily for 14 days total.  A follow-up urine study 05/07/2021 positive for ESBL E. coli discussed with infectious disease pharmacy initially on meropenem to 1 dose of fosfomycin and completed treatment and remains on contact precautions.  Patient has had bouts of urinary retention requiring in and out catheterization with Foley catheter tube recently removed.  Therapy evaluations completed due to patient decreased functional mobility was admitted for a comprehensive rehab program.  Review of Systems  Constitutional:  Positive for malaise/fatigue. Negative for fever.       Decreased appetite  HENT:  Negative for hearing loss.   Eyes:  Positive for blurred vision.  Respiratory:  Negative for cough and shortness of breath.   Cardiovascular:  Negative for chest pain, palpitations and leg swelling.  Gastrointestinal:  Positive for nausea and vomiting.  Genitourinary:  Negative for dysuria, flank pain and hematuria.  Musculoskeletal:  Positive for joint pain and myalgias.  Skin:  Negative for rash.  Neurological:  Positive for weakness and headaches. Negative for seizures.  All other systems reviewed and are negative. Past Medical History:  Diagnosis Date   Hypertension    Hypothyroidism    Past Surgical History:  Procedure Laterality Date   IR FLUORO GUIDE CV LINE RIGHT  05/08/2021   IR US GUIDE VASC ACCESS RIGHT  05/08/2021   History reviewed. No pertinent family history. Social History:  reports that she has been smoking e-cigarettes. She has never used smokeless tobacco. She reports previous alcohol use. She reports previous drug use. Allergies: No Known Allergies Medications Prior to Admission  Medication  Sig Dispense Refill   [EXPIRED] amoxicillin (AMOXIL) 500 MG capsule Take 1,000 mg  by mouth every 12 (twelve) hours. Take for 14 days. (Patient not taking: Reported on 05/03/2021)     [EXPIRED] clarithromycin (BIAXIN) 500 MG tablet Take 500 mg by mouth every 12 (twelve) hours. Take for 14 days. (Patient not taking: Reported on 05/03/2021)     levothyroxine (SYNTHROID) 100 MCG tablet Take 100 mcg by mouth daily. (Patient not taking: Reported on 05/03/2021)     metoprolol succinate (TOPROL-XL) 25 MG 24 hr tablet Take 25 mg by mouth daily. (Patient not taking: Reported on 05/03/2021)     ondansetron (ZOFRAN ODT) 4 MG disintegrating tablet Take 1 tablet (4 mg total) by mouth every 8 (eight) hours as needed for nausea or vomiting. (Patient not taking: Reported on 05/03/2021) 10 tablet 0   pantoprazole (PROTONIX) 40 MG tablet Take 40 mg by mouth 2 (two) times daily. (Patient not taking: Reported on 05/03/2021)     sucralfate (CARAFATE) 1 g tablet Take 1 g by mouth every 6 (six) hours. Take for 14 days. (Patient not taking: Reported on 05/03/2021)      Drug Regimen Review Drug regimen was reviewed and remains appropriate with no significant issues identified  Home: Home Living Family/patient expects to be discharged to:: Private residence Living Arrangements: Parent, Other relatives (sister) Available Help at Discharge: Family, Available 24 hours/day Type of Home: House Home Access: Stairs to enter [employer] of Steps: 5 Home Layout: One level Bathroom Shower/Tub: Tub/shower unit Home Equipment: Wheelchair - manual, Shower seat Additional Comments: pt's older sister was present on eval and answered many questions as pt was very lethargic.   Functional History: Prior Function Level of Independence: Independent Comments: prior to 4/22 pt was working at [employer] and going to [employer] Since 4/22 pt has been mostly in bed and getting weaker and weaker, family has been helping with ambulation and ADL's  Functional Status:  Mobility: Bed Mobility Overal bed mobility: Needs  Assistance Bed Mobility: Rolling, Sidelying to Sit, Sit to Sidelying Rolling: Min assist Sidelying to sit: Min assist, Mod assist Supine to sit: Mod assist Sit to supine: Max assist Sit to sidelying: Min assist General bed mobility comments: required minA to roll and bring LEs off bed then trunk elevation. Increased time to perform due to fatigue Transfers Overall transfer level: Needs assistance Equipment used: Rolling walker (2 wheeled) Transfer via Lift Equipment: Stedy Transfers: Sit to/from [employer] to Stand: Min assist, +2 physical assistance Stand pivot transfers: Min assist, +2 physical assistance  Lateral/Scoot Transfers: Max assist General transfer comment: minA+2 to rise into stand. Cues for hand placement. Increased time to performa and max encouragement due to fear of falling Ambulation/Gait Ambulation/Gait assistance: Min assist, +2 physical assistance, +2 safety/equipment Gait Distance (Feet): 10 Feet (x5', x5') Assistive device: Rolling walker (2 wheeled) Gait Pattern/deviations: Step-through pattern, Decreased stride length, Decreased weight shift to right, Decreased weight shift to left, Shuffle, Trunk flexed, Wide base of support General Gait Details: Ambulated fwd/bwd 10' with RW requiring minA+2 for balance and RW management. Short bouts 2 x 5' with minA+2 for similar set up. Prolonged rest break required between each bout but patient motivated at progress made Gait velocity: decreased    ADL: ADL Overall ADL's : Needs assistance/impaired Eating/Feeding: Set up, Bed level Grooming: Wash/dry face, Wash/dry hands, Minimal assistance, Sitting, Moderate assistance Grooming Details (indicate cue type and reason): Min-Mod A for balance while sitting on sara stedy at sink. Cues for  sequencing washing her face and hands. Upper Body Bathing: Total assistance, Bed level Lower Body Bathing: Total assistance Upper Body Dressing : Maximal assistance Lower Body Dressing:  Total assistance Toilet Transfer: +2 for physical assistance, +2 for safety/equipment, BSC, Maximal assistance Toilet Transfer Details (indicate cue type and reason): staff to use lift equip Toileting- Clothing Manipulation and Hygiene: Moderate assistance, +2 for physical assistance, Sit to/from stand Toileting - Clothing Manipulation Details (indicate cue type and reason): Mod A for posterior peri care. Min-Mod A for balance in standing. Pt particiapting in performing both naterioer and posterior peri care Functional mobility during ADLs: Maximal assistance, Minimal assistance, +2 for physical assistance (sit<>stand with stedy) General ADL Comments: Pt performing sit<>stand with stedy, grooming tasks at sink, and washing up peri area at sink.  Cognition: Cognition Overall Cognitive Status: Impaired/Different from baseline Orientation Level: Oriented X4 Cognition Arousal/Alertness: Awake/alert Behavior During Therapy: Flat affect Overall Cognitive Status: Impaired/Different from baseline Area of Impairment: Safety/judgement, Following commands, Attention, Memory, Problem solving Orientation Level: Time Current Attention Level: Selective Memory: Decreased short-term memory Following Commands: Follows one step commands consistently, Follows one step commands with increased time Safety/Judgement: Decreased awareness of safety, Decreased awareness of deficits Awareness: Emergent Problem Solving: Slow processing, Requires verbal cues, Requires tactile cues General Comments: requiring increased time to process commands. Improved awareness of deficits and need for assistance. STM deficits still prevalent with patient having difficulty remembering what she ordered for lunch Difficult to assess due to: Level of arousal  Physical Exam: Blood pressure 134/70, pulse 83, temperature 99.2 F (37.3 C), temperature source Axillary, resp. rate 17, height 5\' 7"  (1.702 m), weight 94 kg, SpO2 98  %. Physical Exam Constitutional:      Appearance: She is obese.  HENT:     Head: Normocephalic and atraumatic.     Right Ear: External ear normal.     Left Ear: External ear normal.     Nose: Nose normal.  Eyes:     Conjunctiva/sclera: Conjunctivae normal.     Pupils: Pupils are equal, round, and reactive to light.  Cardiovascular:     Rate and Rhythm: Normal rate and regular rhythm.     Heart sounds: No murmur heard.   No gallop.  Pulmonary:     Effort: Pulmonary effort is normal. No respiratory distress.     Breath sounds: No wheezing.  Abdominal:     General: Bowel sounds are normal. There is no distension.     Tenderness: There is no abdominal tenderness.  Musculoskeletal:        General: Swelling (LUE) present.     Cervical back: Normal range of motion.  Skin:    General: Skin is warm.  Neurological:     Mental Status: She is alert.     Comments: Patient is alert sitting up in bed.  Oriented to person and place.  Follows simple commands.  She does have complaints of blurred vision left greater than right. Left lens of glasses taped. C/o diplopia during visual exam but gaze not grossly dysconjugate. Fair insight and awareness. Functional memory. Slightly delayed. Speech ?dysarthric. Motor 4- to 4+/5 UE and 3-4/5 prox to distal in LE's. DTR's 3+. Sensory 1+/2 all 4 limbs, senses pain and light touch.   Psychiatric:     Comments: Pt pleasant and cooperative    Results for orders placed or performed during the hospital encounter of 05/03/21 (from the past 48 hour(s))  CBC     Status: Abnormal   Collection Time:  05/16/21  5:00 AM  Result Value Ref Range   WBC 8.9 4.0 - 10.5 K/uL   RBC 3.92 3.87 - 5.11 MIL/uL   Hemoglobin 11.2 (L) 12.0 - 15.0 g/dL   HCT 78.2 (L) 95.6 - 21.3 %   MCV 90.3 80.0 - 100.0 fL   MCH 28.6 26.0 - 34.0 pg   MCHC 31.6 30.0 - 36.0 g/dL   RDW 08.6 (H) 57.8 - 46.9 %   Platelets 190 150 - 400 K/uL   nRBC 0.0 0.0 - 0.2 %    Comment: Performed at [facility], [address], [place], [place] [address]  Basic metabolic panel     Status: Abnormal   Collection Time: 05/16/21  5:00 AM  Result Value Ref Range   Sodium 143 135 - 145 mmol/L   Potassium 3.4 (L) 3.5 - 5.1 mmol/L   Chloride 112 (H) 98 - 111 mmol/L   CO2 25 22 - 32 mmol/L   Glucose, Bld 94 70 - 99 mg/dL    Comment: Glucose reference range applies only to samples taken after fasting for at least 8 hours.   BUN 6 6 - 20 mg/dL   Creatinine, Ser 8.41 0.44 - 1.00 mg/dL   Calcium 8.4 (L) 8.9 - 10.3 mg/dL   GFR, Estimated >32 >44 mL/min    Comment: (NOTE) Calculated using the CKD-EPI Creatinine Equation (2021)    Anion gap 6 5 - 15    Comment: Performed at [facility], [address] [place], [place] [address]  Magnesium     Status: None   Collection Time: 05/16/21  5:00 AM  Result Value Ref Range   Magnesium 2.1 1.7 - 2.4 mg/dL    Comment: Performed at [facility], [address] [place], [place] [address]  Basic metabolic panel     Status: Abnormal   Collection Time: 05/17/21  4:30 AM  Result Value Ref Range   Sodium 143 135 - 145 mmol/L   Potassium 4.0 3.5 - 5.1 mmol/L   Chloride 114 (H) 98 - 111 mmol/L   CO2 23 22 - 32 mmol/L   Glucose, Bld 100 (H) 70 - 99 mg/dL    Comment: Glucose reference range applies only to samples taken after fasting for at least 8 hours.   BUN 9 6 - 20 mg/dL   Creatinine, Ser 4.40 0.44 - 1.00 mg/dL   Calcium 8.7 (L) 8.9 - 10.3 mg/dL   GFR, Estimated >34 >74 mL/min    Comment: (NOTE) Calculated using the CKD-EPI Creatinine Equation (2021)    Anion gap 6 5 - 15    Comment: Performed at [facility], [address] [place], [place] [address]  CBC     Status: Abnormal   Collection Time: 05/17/21  4:30 AM  Result Value Ref Range   WBC 11.6 (H) 4.0 - 10.5 K/uL   RBC 3.98 3.87 - 5.11 MIL/uL   Hemoglobin 11.5 (L) 12.0 - 15.0 g/dL   HCT 38.7 (L) 56.4 - 33.2 %   MCV 90.2 80.0 - 100.0 fL   MCH 28.9 26.0 - 34.0 pg    MCHC 32.0 30.0 - 36.0 g/dL   RDW 95.1 (H) 88.4 - 16.6 %   Platelets 186 150 - 400 K/uL   nRBC 0.0 0.0 - 0.2 %    Comment: Performed at [facility], [address], [place], [place] [address]   No results found.  Medical Problem List and Plan: 1.  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  Patient received 5 days IV Solu-Medrol/5 treatments and plasmapheresis  -patient may shower  -ELOS/Goals: 14 days, supervision to min assist with PT and OT 2.  Antithrombotics: -DVT/anticoagulation: SCDs  -antiplatelet therapy: N/A 3. Pain Management: Tylenol as needed 4. Mood: Provide emotional support  -antipsychotic agents: N/A 5. Neuropsych: This patient is capable of making decisions on her own behalf. 6. Skin/Wound Care: Routine skin checks 7. Fluids/Electrolytes/Nutrition: Routine in and outs with follow-up chemistries 8.  Hypertension.  Toprol-XL 25 mg daily.  Monitor with increased mobility 9.  Hypothyroidism.  Continue Synthroid. 10.  H. pylori.  Treatment initiated 6/24.  Amoxicillin 1 g, Biaxin 500 mg twice daily, PPI twice daily x14 days total 11.  UTI/ESBL/E. Coli/urinary retention.  Contact precautions.  Patient completed course of fosfomycin.  Check PVRs 12.  Obesity.  BMI 31.28.  Dietary follow-up 13.  CKD stage III.  Follow-up chemistries 14.  Tobacco abuse.  Counseling   Charlton AmorDaniel J Angiulli, PA-C 05/17/2021

## 2021-05-16 NOTE — Progress Notes (Signed)
PROGRESS NOTE    Diane Todd  ZOX:[id number] DOB: 2001-10-07 DOA: 05/03/2021 PCP: Pcp, No   Brief Narrative:  Diane Todd is an 20 y.o. female past medical history of essential hypertension, hypothyroidism transferred from [facility] for concern after her MRI showed multiple brain abnormalities and she had been having generalized weakness poor appetite and blurry vision.   Neurology was consulted and appreciate assistance, recommended serum for AQP4-IgG antibody pending, MOG-IgG antibody negative. Completed 5 days of high-dose steroids and 3 days of high-dose thiamine, c/w thiamine without significant improvement. Started on plasmapheresis with plans for 5 total sessions, last session will be on 7/1 (this will be clarified by neurology today) Physical therapy evaluated the patient recommended inpatient rehab. 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  She was supposed to have a treatment yesterday but missed that due to staffing issues.   Pregnancy test negative. Physical therapy  evaluated the patient recommended inpatient rehab.  CIR has initiated insurance authorization with [employer] [place] for possible admission 7/1 pending insurance approval. Speech therapy evaluated and recommend regular diet. Improved.   Essential hypertension: Blood pressures well controlled, on metoprolol.   Acute Kidney injury  Patient presented with a creatinine of 1.5 Resolved.   Hypothyroidism TSH of 0.3.   History of H. pylori: Treatment initated today 6/24 Amoxicillin 1g bid, clarithromycin  bid, ppi bid all for 14 days, day 7 of 14   Cloudy urine, asymptomatic Urinalysis indicates possible infection Since she is immunosuppressed, she was started on meropenem Urine culture in process Last urine culture from 05/07/21 was positive for ESBL E. Coli Prior hospitalist MD could not confirm if this was treated with antibiotics As per communication with ID pharmacist 6/29, felt appropriate to change from meropenem to a dose of fosfomycin-completed treatment course  Nutrition Status: Nutrition Problem: Severe Malnutrition Etiology: acute illness Signs/Symptoms: energy intake < or equal to 50% for > or equal to 1 month, percent weight loss Percent weight loss: 17 % Interventions: Tube feeding, Prostat Patient has had poor p.o. intake for months and there was consideration for cortrak placement for nutrition Within the past few days, p.o. intake has started to improve, will continue to monitor  Acute urinary retention Foley catheter placed early on in admission.  Plasmaphoresis team confirmed no need for Foley catheter for them.  Discontinued Foley catheter and voiding spontaneously.  Hypomagnesemia/hypomagnesemia: Replaced.  Replace low potassium.  Anemia: Unclear etiology.  Hemoglobin dropped from 13 range to 11 in the absence of overt bleeding.?  Related to plasmaphoresis.  Stable.  Body mass index is 32.46 kg/m./Obesity      DVT prophylaxis: SCD/Compression  stockings  Code Status: Full  Family Communication: Discussed in detail with sister at bedside on 6/29, updated care and answered all questions.  None at bedside today Disposition Plan:    CIR.  Consults called:  NEURO Admission status: Inpatient Status is: Inpatient   Remains inpatient appropriate because: Ongoing plasmaphoresis.   Dispo: The patient is from: Home              Anticipated d/c is to: CIR              Patient currently is not medically stable to d/c.              Difficult to place patient No    Procedures:  MR BRAIN W WO CONTRAST  Result Date: 05/04/2021 CLINICAL DATA:  Weakness and blurry vision. EXAM: MRI HEAD WITH AND WITHOUT CONTRAST MRI CERVICAL AND THORACIC SPINE WITHOUT AND WITH CONTRAST TECHNIQUE: Multiplanar and multiecho pulse sequences of the head and cervical spine, to include the craniocervical junction and cervicothoracic junction, and the thoracic spine, were obtained without and with intravenous contrast. CONTRAST:  49mL GADAVIST GADOBUTROL 1 MMOL/ML IV SOLN COMPARISON:  None. FINDINGS: MRI BRAIN FINDINGS Brain: No acute infarct, mass effect or extra-axial collection. No acute or chronic hemorrhage. There is hyperintense T2-weighted signal within the medial thalami and periaqueductal gray matter. There is mild contrast enhancement of the periaqueductal gray matter but no other enhancing lesion. The midline structures are normal. Vascular: Major flow voids are preserved. Skull and upper cervical spine: Normal calvarium and skull base. Visualized upper cervical spine and soft tissues are normal. Sinuses/Orbits:No paranasal sinus fluid levels or advanced mucosal thickening. No mastoid or middle ear effusion. Normal orbits. MRI CERVICAL SPINE FINDINGS Alignment: Physiologic. Vertebrae: No fracture, evidence of discitis, or bone lesion. Cord: Normal signal and morphology. No abnormal contrast enhancement. Posterior Fossa, vertebral arteries, paraspinal tissues:  Negative. Disc levels: No spinal canal or neural foraminal stenosis. MRI THORACIC SPINE FINDINGS Alignment:  Physiologic. Vertebrae: No fracture, evidence of discitis, or bone lesion. Cord: Normal signal and morphology. No abnormal contrast enhancement Paraspinal and other soft tissues: Negative. Disc levels: T7-8: Small right subarticular disc protrusion without stenosis. T8-9: Small left subarticular disc protrusion without stenosis. The other thoracic disc levels are normal. IMPRESSION: 1. Hyperintense T2-weighted signal and mild contrast enhancement within the medial thalami and periaqueductal gray matter. Demyelinating disease remains a primary consideration. MRI of the orbits with and without contrast might be helpful for better characterization of the optic nerves. CSF sampling should also be considered. 2. No demyelinating lesions of the cervical or thoracic spinal cord. Electronically Signed   By: Deatra Robinson M.D.   On: 05/04/2021 02:42   MR CERVICAL SPINE W WO CONTRAST  Result Date: 05/04/2021 CLINICAL DATA:  Weakness and blurry vision. EXAM: MRI HEAD WITH AND WITHOUT CONTRAST MRI CERVICAL AND THORACIC SPINE WITHOUT AND WITH CONTRAST TECHNIQUE: Multiplanar and multiecho pulse sequences of the head and cervical spine, to include the craniocervical junction and cervicothoracic junction, and the thoracic spine, were obtained without and with intravenous contrast. CONTRAST:  35mL GADAVIST GADOBUTROL 1 MMOL/ML IV SOLN COMPARISON:  None. FINDINGS: MRI BRAIN FINDINGS Brain: No acute infarct, mass effect or extra-axial collection. No acute or chronic hemorrhage. There is hyperintense T2-weighted signal within the medial thalami and periaqueductal gray matter. There is mild contrast enhancement of the periaqueductal gray matter but no other enhancing lesion. The midline structures are normal. Vascular: Major flow voids are preserved. Skull and upper cervical spine: Normal calvarium and skull base. Visualized  upper cervical spine and soft tissues are normal. Sinuses/Orbits:No paranasal sinus fluid levels or advanced mucosal thickening. No mastoid or middle ear effusion. Normal orbits. MRI CERVICAL SPINE FINDINGS Alignment: Physiologic. Vertebrae: No fracture, evidence of discitis, or bone lesion. Cord: Normal signal and morphology. No abnormal contrast enhancement. Posterior Fossa, vertebral arteries, paraspinal tissues: Negative. Disc levels: No spinal canal or neural foraminal stenosis. MRI THORACIC SPINE FINDINGS Alignment:  Physiologic. Vertebrae: No fracture, evidence of discitis, or bone lesion. Cord: Normal signal and morphology. No abnormal contrast enhancement Paraspinal and other soft tissues: Negative. Disc levels: T7-8: Small right subarticular disc protrusion without stenosis. T8-9: Small left subarticular disc protrusion without stenosis. The other thoracic disc levels are normal. IMPRESSION: 1. Hyperintense T2-weighted signal and mild contrast enhancement within the medial thalami and periaqueductal gray matter. Demyelinating disease remains a primary consideration. MRI of the orbits with and without contrast might be helpful for better characterization of the optic nerves. CSF sampling should also be considered. 2. No demyelinating lesions of the cervical or thoracic spinal cord. Electronically Signed   By: Deatra Robinson M.D.   On: 05/04/2021 02:42   MR THORACIC SPINE W WO CONTRAST  Result Date: 05/04/2021 CLINICAL DATA:  Weakness and blurry vision. EXAM: MRI HEAD WITH AND WITHOUT CONTRAST MRI CERVICAL AND THORACIC SPINE WITHOUT AND WITH CONTRAST TECHNIQUE: Multiplanar and multiecho pulse sequences of the head and cervical spine, to include the craniocervical junction and cervicothoracic junction, and the thoracic spine, were obtained without and with intravenous contrast. CONTRAST:  34mL GADAVIST GADOBUTROL 1 MMOL/ML IV SOLN COMPARISON:  None. FINDINGS: MRI BRAIN FINDINGS Brain: No acute infarct,  mass effect or extra-axial collection. No acute or chronic hemorrhage. There is hyperintense T2-weighted signal within the medial thalami and periaqueductal gray matter. There is mild contrast enhancement of the periaqueductal gray matter but no other enhancing lesion. The midline structures are normal. Vascular: Major flow voids are preserved. Skull and upper cervical spine: Normal calvarium and skull base. Visualized upper cervical spine and soft tissues are normal. Sinuses/Orbits:No paranasal sinus fluid levels or advanced mucosal thickening. No mastoid or middle ear effusion. Normal orbits. MRI CERVICAL SPINE FINDINGS Alignment: Physiologic. Vertebrae: No fracture, evidence of discitis, or bone lesion. Cord: Normal signal and morphology. No abnormal contrast enhancement. Posterior Fossa, vertebral arteries, paraspinal tissues: Negative. Disc levels: No spinal canal or neural foraminal stenosis. MRI THORACIC SPINE FINDINGS Alignment:  Physiologic. Vertebrae: No fracture, evidence of discitis, or bone lesion. Cord: Normal signal and morphology. No abnormal contrast enhancement Paraspinal and other soft tissues: Negative. Disc levels: T7-8: Small right subarticular disc protrusion without stenosis. T8-9: Small left subarticular disc protrusion without stenosis. The other thoracic disc levels are normal. IMPRESSION: 1. Hyperintense T2-weighted signal and mild contrast enhancement within the medial thalami and periaqueductal gray matter. Demyelinating disease remains a primary consideration. MRI of the orbits with and without contrast might be helpful for better characterization of the optic nerves. CSF sampling should also be considered. 2. No demyelinating lesions of the cervical or thoracic spinal cord. Electronically Signed   By: Deatra Robinson M.D.   On: 05/04/2021 02:42   US RENAL  Result Date: 05/04/2021 CLINICAL DATA:  Chronic renal disease. EXAM: RENAL / URINARY TRACT ULTRASOUND COMPLETE COMPARISON:  CT  abdomen and pelvis March 14, 2021. FINDINGS: Right Kidney: Renal measurements: 10.2 x 4.2 x 5.0 cm = volume: 112.0 mL. Mild renal cortical thinning. No hydronephrosis. Left Kidney: Renal measurements: May 0.4 x 4.5 x 3.9 cm = volume: 76.7 mL.  Mild renal cortical thinning. No hydronephrosis. Bladder: Decompressed with Foley catheter. Other: None. IMPRESSION: No hydronephrosis. Electronically Signed   By: Annia Belt M.D.   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 Catheter needed for plasmapheresis. EXAM: FLUOROSCOPIC AND ULTRASOUND GUIDED PLACEMENT OF A NON-TUNNELED DIALYSIS CATHETER Physician: Rachelle Hora. Henn, MD MEDICATIONS: 1% lidocaine ANESTHESIA/SEDATION: None FLUOROSCOPY TIME:  Fluoroscopy Time: 12 seconds, 7 mGy COMPLICATIONS: None immediate. PROCEDURE: Informed consent was obtained for catheter placement. The patient was placed supine on the interventional table. Ultrasound confirmed a patent right internal jugular vein. Ultrasound images were obtained for documentation. The right neck was prepped and draped in a sterile fashion. The right neck was anesthetized with 1% lidocaine. Maximal barrier sterile technique was utilized including caps, mask, sterile gowns, sterile gloves, sterile drape, hand hygiene and skin antiseptic. A small incision was made with #11 blade scalpel. A 21 gauge needle directed into the right internal jugular vein with ultrasound guidance. A micropuncture dilator set was placed. A 15 cm Trialysis was selected. The catheter was advanced over a wire and positioned at the superior cavoatrial junction. Fluoroscopic images were obtained for documentation. Both dialysis lumens were found to aspirate and flush well. The proper amount of heparin was flushed in both lumens. The central venous lumen was flushed with normal saline. Catheter was sutured to skin. FINDINGS: Catheter tip at the superior  cavoatrial junction. IMPRESSION: Successful placement of a right jugular non-tunneled dialysis/pheresis catheter using ultrasound and fluoroscopic guidance. Electronically Signed   By: Richarda Overlie M.D.   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 Catheter needed for plasmapheresis.   EXAM: FLUOROSCOPIC AND ULTRASOUND GUIDED PLACEMENT OF A NON-TUNNELED DIALYSIS CATHETER   Physician: Rachelle Hora. Henn, MD   MEDICATIONS: 1% lidocaine   ANESTHESIA/SEDATION: None   FLUOROSCOPY TIME:  Fluoroscopy Time: 12 seconds, 7 mGy   COMPLICATIONS: None immediate.   PROCEDURE: Informed consent was obtained for catheter placement. The patient was placed supine on the interventional table. Ultrasound confirmed a patent right internal jugular vein. Ultrasound images were obtained for documentation. The right neck was prepped and draped in a sterile fashion. The right neck was anesthetized with 1% lidocaine. Maximal barrier sterile technique was utilized including caps, mask, sterile gowns, sterile gloves, sterile drape, hand hygiene and skin antiseptic. A small incision was made with #11 blade scalpel. A 21 gauge needle directed into the right internal jugular vein with ultrasound guidance. A micropuncture dilator set was placed. A 15 cm Trialysis was selected. The catheter was advanced over a wire and positioned at the superior cavoatrial junction. Fluoroscopic images were obtained for documentation. Both dialysis lumens were found to aspirate and flush well. The proper amount of heparin was flushed in both lumens. The central venous lumen was flushed with normal saline. Catheter was sutured to skin.   FINDINGS: Catheter tip at the superior cavoatrial junction.   IMPRESSION: Successful placement of a right jugular non-tunneled dialysis/pheresis catheter using ultrasound and fluoroscopic guidance.     Electronically Signed   By: Richarda Overlie M.D.   On: 05/08/2021 16:58     DG FL GUIDED LUMBAR PUNCTURE  Result Date: 05/08/2021 CLINICAL DATA:  Optic neuritis, altered mental status EXAM: DIAGNOSTIC LUMBAR PUNCTURE UNDER FLUOROSCOPIC GUIDANCE COMPARISON:  None FLUOROSCOPY TIME:  Fluoroscopy Time:  12 seconds Radiation Exposure Index (if provided by the fluoroscopic device):  1.5 mGy Number of Acquired Spot Images: 0 PROCEDURE: Informed consent was obtained from the patient prior to the procedure, including potential complications of headache, allergy, and pain. With the patient prone, the lower back was prepped with Betadine. 1% Lidocaine was used for local anesthesia. Lumbar puncture was performed at the L4-L5 level using a 20 gauge needle with return of clear CSF with an opening pressure of 17 cm water. Approximately 13 ml of CSF were obtained for laboratory studies. The patient tolerated the procedure well and there were no apparent complications. IMPRESSION: Successful lumbar puncture. Approximately 13 mL of clear CSF were obtained and sent to the laboratory for analysis. There were no immediate complications. Electronically Signed   By: Caprice Renshaw   On: 05/08/2021 09:48   MR ORBITS W WO CONTRAST  Result Date: 05/05/2021 CLINICAL DATA:  Optic neuritis suspected. EXAM: MRI OF THE ORBITS WITHOUT AND WITH CONTRAST TECHNIQUE: Multiplanar, multi-echo pulse sequences of the orbits and surrounding structures were acquired including fat saturation techniques, before and after intravenous contrast administration. CONTRAST:  10mL GADAVIST GADOBUTROL 1 MMOL/ML IV SOLN COMPARISON:  Abnormal MRI of the brain. FINDINGS: Orbits: Globes are within normal limits bilaterally. The optic nerves are unremarkable. Chiasm is within normal limits. Abnormal signal or enhancement is present. Extraocular muscles are within normal limits. Visualized sinuses: Polyp or mucous retention cyst is noted in the left maxillary sinus. The paranasal sinuses and mastoid air cells are otherwise clear. Soft  tissues: Periorbital soft tissues and visualized facial soft tissues are within normal limits. 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 There is subtle enhancement along the periaqueductal gray matter and medial thalami. IMPRESSION: 1. Normal MRI appearance of the orbits. 2. Subtle enhancement along the periaqueductal gray matter and medial thalami bilaterally. This corresponds to the abnormal signal changes on the MRI from yesterday. In addition to a demyelinating process, or Wernicke encephalopathy (thiamine deficiency encephalopathy) is also considered. Electronically Signed   By: Marin Roberts M.D.   On: 05/05/2021 13:25    Antimicrobials:  ABX for hypylori day 6 of 14  Meropenem 6/27 > 6/29 Fosfomycin x1 dose on 6/29.   Subjective: Patient seen this morning in HD unit while she was getting plasmapheresis.  No change in visual symptoms since yesterday.  No new complaints reported  Objective: Vitals:   05/16/21 1011 05/16/21 1026 05/16/21 1109 05/16/21 1155  BP: 99/62 102/66 (!) 102/59 109/72  Pulse: (!) 113 (!) 111 (!) 104 (!) 103  Resp: (!) 21 (!) 22 (!) 21 18  Temp:   99.6 F (37.6 C)   TempSrc:   Oral   SpO2:   99% 99%  Weight:      Height:        Intake/Output Summary (Last 24 hours) at 05/16/2021 1800 Last data filed at 05/16/2021 1700 Gross per 24 hour  Intake 120 ml  Output 900 ml  Net -780 ml   Filed Weights   05/10/21 1415 05/13/21 1502 05/16/21 0849  Weight: 96.4 kg 90.6 kg 94 kg    Examination:  General exam: Pleasant young female, moderately built and obese lying comfortably propped up in bed without distress.  Has eyeglasses on with left side taped.  Ongoing plasmaphoresis. Respiratory system: Clear to auscultation.  No increased work of breathing. Cardiovascular system: S1 and S2 heard, RRR.  No JVD, murmurs or pedal edema.   Gastrointestinal system: Abdomen is nondistended, soft and nontender. No  organomegaly or masses felt. Normal bowel  sounds heard. Central nervous system: Alert and oriented.  Mild disconjugate gaze but otherwise no cranial nerve deficits.  Stable without change. Extremities: No C/C/E, +pedal pulses.  Symmetric 5 x 5 power. Skin: No rashes, lesions or ulcers Psychiatry: Judgement and insight appear normal. Mood & affect flat     Data Reviewed: I have personally reviewed following labs and imaging studies  CBC: Recent Labs  Lab 05/10/21 0559 05/11/21 0437 05/13/21 0650 05/14/21 1539 05/15/21 0659 05/16/21 0500  WBC 9.0 15.2* 11.1* 14.3* 10.7* 8.9  NEUTROABS 6.4 13.3* 8.3*  --   --   --   HGB 13.7 15.2* 13.3 13.1 11.3* 11.2*  HCT 42.8 45.7 41.5 40.2 35.0* 35.4*  MCV 87.7 85.3 87.6 88.5 89.1 90.3  PLT 130* 205 212 203 170 190   Basic Metabolic Panel: Recent Labs  Lab 05/11/21 0437 05/13/21 0650 05/14/21 1539 05/15/21 0659 05/16/21 0500  NA 138 142 143 144 143  K 3.8 3.2* 2.9* 3.8 3.4*  CL 106 109 115* 117* 112*  CO2 GLUCOSE 116* 85 130* 94 94  BUN CREATININE 1.02* 1.01* 0.88 0.81 0.76  CALCIUM 8.9 8.8* 8.2* 8.4* 8.4*  MG  --   --   --  1.6* 2.1   GFR: Estimated Creatinine Clearance: 132.1 mL/min (by C-G formula based on SCr of 0.76 mg/dL). Liver Function Tests: Recent Labs  Lab 05/10/21 0337 05/13/21 0650 05/15/21 0659  AST 33 24 27  ALT 28 40 27  ALKPHOS 20* 36* 35*  BILITOT 1.2 1.4* 0.5  PROT 4.4* 5.0* 4.1*  ALBUMIN 3.2* 3.4* 2.8*     Recent Results (from the past 240 hour(s))  Culture, Urine     Status: Abnormal   Collection Time: 05/07/21 11:31 PM   Specimen: Urine, Random  Result Value Ref Range Status   Specimen Description URINE, RANDOM  Final   Special Requests   Final    NONE Performed at [facility], [address], [place], [place] [address]    Culture (A)  Final    >=100,000 COLONIES/mL ESCHERICHIA COLI Confirmed Extended Spectrum Beta-Lactamase Producer (ESBL).  In  bloodstream infections from ESBL organisms, carbapenems are preferred over piperacillin/tazobactam. They are shown to have a lower risk of mortality.    Report Status 05/10/2021 FINAL  Final   Organism ID, Bacteria ESCHERICHIA COLI (A)  Final      Susceptibility   Escherichia coli - MIC*    AMPICILLIN >=32 RESISTANT Resistant     CEFAZOLIN >=64 RESISTANT Resistant     CEFEPIME 2 SENSITIVE Sensitive     CEFTRIAXONE >=64 RESISTANT Resistant     CIPROFLOXACIN 0.5 SENSITIVE Sensitive     GENTAMICIN >=16 RESISTANT Resistant     IMIPENEM <=0.25 SENSITIVE Sensitive     NITROFURANTOIN <=16 SENSITIVE Sensitive     TRIMETH/SULFA >=320 RESISTANT Resistant     AMPICILLIN/SULBACTAM >=32 RESISTANT Resistant     PIP/TAZO <=4 SENSITIVE Sensitive     * >=100,000 COLONIES/mL ESCHERICHIA COLI  CSF culture w Gram Stain     Status: None   Collection Time: 05/08/21  9:21 AM   Specimen: PATH Cytology CSF; Cerebrospinal Fluid  Result Value Ref Range Status   Specimen Description CSF  Final   Special Requests NONE  Final   Gram Stain CYTOSPIN SMEAR NO WBC SEEN NO ORGANISMS SEEN   Final   Culture   Final    NO GROWTH 3 DAYS  Performed at [facility], [address] [place], [place] [address]    Report Status 05/11/2021 FINAL  Final  Fungus Culture With Stain     Status: None (Preliminary result)   Collection Time: 05/08/21  9:21 AM   Specimen: PATH Cytology CSF; Cerebrospinal Fluid  Result Value Ref Range Status   Fungus Stain Final report  Final    Comment: (NOTE) Performed At: [facility] [address] [place], [place] [id number] Jolene SchimkeNagendra Sanjai MD AO:[id number]:[phone]    Fungus (Mycology) Culture PENDING  Incomplete   Fungal Source CSF  Final    Comment: Performed at [facility], [address] [place], [place] [address]  Anaerobic culture w Gram Stain     Status: None   Collection Time: 05/08/21  9:21 AM   Specimen: PATH Cytology CSF; Cerebrospinal Fluid  Result Value Ref  Range Status   Specimen Description CSF  Final   Special Requests NONE  Final   Gram Stain   Final    WBC PRESENT, PREDOMINANTLY MONONUCLEAR NO ORGANISMS SEEN CYTOSPIN SMEAR    Culture   Final    NO ANAEROBES ISOLATED Performed at [facility], [address] [place], [place] [address]    Report Status 05/13/2021 FINAL  Final  Fungus Culture Result     Status: None   Collection Time: 05/08/21  9:21 AM  Result Value Ref Range Status   Result 1 Comment  Final    Comment: (NOTE) KOH/Calcofluor preparation:  no fungus observed. Performed At: [facility] [address] [place], [place] [id number] Jolene SchimkeNagendra Sanjai MD ZD:[id number]:[phone]          Radiology Studies: No results found.      Scheduled Meds:  amoxicillin  1,000 mg Oral Q12H   calcium carbonate       Chlorhexidine Gluconate Cloth  6 each Topical Daily   clarithromycin  500 mg Oral Q12H   docusate sodium  100 mg Oral BID   feeding supplement  1 Container Oral TID BM   levothyroxine  50 mcg Intravenous Daily   metoprolol succinate  25 mg Oral Daily   multivitamin with minerals  1 tablet Oral Daily   pantoprazole  40 mg Oral BID   Continuous Infusions:     LOS: 13 days    Time spent: 35  min  Marcellus ScottAnand Jaliya Siegmann, MD, MissionFACP, [facility]FHM. [facility]  To contact the attending provider between 7A-7P or the covering provider during after hours 7P-7A, please log into the web site [email or web address] and access using universal [place] password for that web site. If you do not have the password, please call the hospital operator.

## 2021-05-16 NOTE — Progress Notes (Signed)
Physical Therapy Treatment Patient Details Name: Diane Todd MRN: [id number] DOB: 2001-04-22 Today's Date: 05/16/2021    History of Present Illness 20 yo female who presents from [facility] on 05/03/21 with worsening blurry vision and difficulty walking. She has had 2 months of N&V and was dx with H pylori gastritis and treated at [facility]. 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 PMH: HTN, thyroid disorder.    PT Comments    Patient making progress towards physical therapy goals. Patient able to perform sit to stand x4 during session with minA+2. Patient ambulated 10' and 2 x 5' with RW and minA+2. Easily fatigued and required extended rest breaks to recover. Patient encouraged and motivated by progress made thus far. Continue to recommend comprehensive inpatient rehab (CIR) for post-acute therapy needs.    Follow Up Recommendations  CIR;Supervision/Assistance - 24 hour     Equipment Recommendations  Rolling Diane Todd with 5" wheels    Recommendations for Other Services       Precautions / Restrictions Precautions Precautions: Fall Precaution Comments: pt fell at home PTA Restrictions Weight Bearing Restrictions: No    Mobility  Bed Mobility Overal bed mobility: Needs Assistance Bed Mobility: Rolling;Sidelying to Sit;Sit to Sidelying Rolling: Min assist Sidelying to sit: Min assist;Mod assist     Sit to sidelying: Min assist General bed mobility comments: required minA to roll and bring LEs off bed then trunk elevation. Increased time to perform due to fatigue    Transfers Overall transfer level: Needs assistance Equipment used: Rolling Dymin Dingledine (2 wheeled) Transfers: Sit to/from Stand Sit to Stand: Min assist;+2 physical assistance Stand pivot transfers: Min assist;+2 physical assistance       General transfer comment: minA+2 to rise into stand. Cues for hand placement. Increased time to performa and max encouragement  due to fear of falling  Ambulation/Gait Ambulation/Gait assistance: Min assist;+2 physical assistance;+2 safety/equipment Gait Distance (Feet): 10 Feet (x5', x5') Assistive device: Rolling Diane Todd (2 wheeled) Gait Pattern/deviations: Step-through pattern;Decreased stride length;Decreased weight shift to right;Decreased weight shift to left;Shuffle;Trunk flexed;Wide base of support Gait velocity: decreased   General Gait Details: Ambulated fwd/bwd 10' with RW requiring minA+2 for balance and RW management. Short bouts 2 x 5' with minA+2 for similar set up. Prolonged rest break required between each bout but patient motivated at progress made   Stairs             Wheelchair Mobility    Modified Rankin (Stroke Patients Only)       Balance Overall balance assessment: Needs assistance Sitting-balance support: Feet supported;Bilateral upper extremity supported Sitting balance-Leahy Scale: Fair Sitting balance - Comments: close supervision for safety   Standing balance support: Bilateral upper extremity supported;During functional activity Standing balance-Leahy Scale: Poor Standing balance comment: reliant on UE support and external assist                            Cognition Arousal/Alertness: Awake/alert Behavior During Therapy: Flat affect Overall Cognitive Status: Impaired/Different from baseline Area of Impairment: Safety/judgement;Following commands;Attention;Memory;Problem solving                   Current Attention Level: Selective Memory: Decreased short-term memory Following Commands: Follows one step commands consistently;Follows one step commands with increased time Safety/Judgement: Decreased awareness of safety;Decreased awareness of deficits Awareness: Emergent Problem Solving: Slow processing;Requires verbal cues;Requires tactile cues General Comments: requiring increased time to process commands. Improved awareness of deficits  and need  for assistance. STM deficits still prevalent with patient having difficulty remembering what she ordered for lunch      Exercises      General Comments        Pertinent Vitals/Pain Pain Assessment: No/denies pain Pain Intervention(s): Monitored during session    Home Living                      Prior Function            PT Goals (current goals can now be found in the care plan section) Acute Rehab PT Goals Patient Stated Goal: I want to get stronger PT Goal Formulation: With patient Time For Goal Achievement: 05/18/21 Potential to Achieve Goals: Good Progress towards PT goals: Progressing toward goals    Frequency    Min 3X/week      PT Plan Current plan remains appropriate    Co-evaluation PT/OT/SLP Co-Evaluation/Treatment: Yes Reason for Co-Treatment: For patient/therapist safety;To address functional/ADL transfers PT goals addressed during session: Mobility/safety with mobility;Balance;Proper use of DME        AM-PAC PT "6 Clicks" Mobility   Outcome Measure  Help needed turning from your back to your side while in a flat bed without using bedrails?: A Lot Help needed moving from lying on your back to sitting on the side of a flat bed without using bedrails?: A Lot Help needed moving to and from a bed to a chair (including a wheelchair)?: Total Help needed standing up from a chair using your arms (e.g., wheelchair or bedside chair)?: Total Help needed to walk in hospital room?: Total Help needed climbing 3-5 steps with a railing? : Total 6 Click Score: 8    End of Session Equipment Utilized During Treatment: Gait belt Activity Tolerance: Patient tolerated treatment well Patient left: in bed;with call bell/phone within reach;with bed alarm set;with family/visitor present Nurse Communication: Mobility status PT Visit Diagnosis: Muscle weakness (generalized) (M62.81);History of falling (Z91.81);Difficulty in walking, not elsewhere classified  (R26.2)     Time: 5621-3086 PT Time Calculation (min) (ACUTE ONLY): 26 min  Charges:  $Gait Training: 8-22 mins                     Diane Todd A. Dan Humphreys PT, DPT Acute Rehabilitation Services Pager [phone] Office [phone]    Diane Todd 05/16/2021, 4:14 PM

## 2021-05-16 NOTE — Progress Notes (Signed)
Bladder scanned 779cc..  In and out cath done 900 cc of clear yellow urine drained.

## 2021-05-16 NOTE — Progress Notes (Signed)
Occupational Therapy Treatment Patient Details Name: Diane Todd MRN: [id number] DOB: 18-Oct-2001 Today's Date: 05/16/2021    History of present illness 20 yo female who presents from [facility] on 05/03/21 with worsening blurry vision and difficulty walking. She has had 2 months of N&V and was dx with H pylori gastritis and treated at [facility]. 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 PMH: HTN, thyroid disorder.   OT comments  Patient making nice progress toward patient focused OT goals.  She needed up to Mod A for sideling to sit to the R.  Patient able to tolerate sitting edge of the bed for up to 10 min with supervision only.  She donned a hospital gown like a jacket seated with Min Guard for balance.  Patient able to stand and walk approximately 74' with Min A of two and a RW for balance support.  Patient with 3/4 complaints of dyspnea, but continued to work hard.  Given the nice progress and ability to tolerate aggressive rehab, CIR continues to be recommended.  OT continues to be warranted in the acute setting to maximize her functional status.  Lower body ADL goal added.    Follow Up Recommendations       Equipment Recommendations  3 in 1 bedside commode;Tub/shower seat;Wheelchair (measurements OT);Wheelchair cushion (measurements OT)    Recommendations for Other Services      Precautions / Restrictions Precautions Precautions: Fall Restrictions Weight Bearing Restrictions: No       Mobility Bed Mobility Overal bed mobility: Needs Assistance Bed Mobility: Rolling;Sidelying to Sit Rolling: Min assist Sidelying to sit: Min assist;Mod assist       General bed mobility comments: patient needing increased assist to Roll to the R compared to previous session rolling L Patient Response: Anxious  Transfers Overall transfer level: Needs assistance   Transfers: Sit to/from Stand;Stand Pivot Transfers Sit to Stand: Min  assist;+2 physical assistance Stand pivot transfers: Min assist;+2 physical assistance       General transfer comment: patient needing encouragement for sit to stand and to take steps at RW level.    Balance Overall balance assessment: Needs assistance Sitting-balance support: Feet supported;Bilateral upper extremity supported Sitting balance-Leahy Scale: Fair     Standing balance support: Bilateral upper extremity supported;During functional activity Standing balance-Leahy Scale: Poor Standing balance comment: needing external support                              Vision Patient Visual Report: Diplopia Alignment/Gaze Preference: Head tilt;Gaze left Tracking/Visual Pursuits: Requires cues, head turns, or add eye shifts to track   Perception     Praxis      Cognition Arousal/Alertness: Awake/alert   Overall Cognitive Status: Impaired/Different from baseline                       Memory: Decreased short-term memory Following Commands: Follows one step commands consistently;Follows one step commands with increased time   Awareness: Emergent Problem Solving: Slow processing;Requires verbal cues;Requires tactile cues                            Pertinent Vitals/ Pain       Pain Assessment: No/denies pain Pain Intervention(s): Monitored during session  Frequency  Min 2X/week        Progress Toward Goals  OT Goals(current goals can now be found in the care plan section)  Progress towards OT goals: Progressing toward goals  Acute Rehab OT Goals Patient Stated Goal: I want to get stronger OT Goal Formulation: With patient Time For Goal Achievement: 05/31/21 Potential to Achieve Goals: Good ADL Goals Pt Will Perform Grooming: sitting;standing;with supervision Pt Will Perform Lower Body Bathing: with supervision;sitting/lateral leans Pt Will Perform  Upper Body Dressing: with set-up;sitting Pt Will Perform Lower Body Dressing: with supervision;with adaptive equipment;sit to/from stand Pt Will Transfer to Toilet: with mod assist;ambulating;regular height toilet Additional ADL Goal #2: Patient will sit EOB for up to 15 min with supervision for increased lower body ADL  Plan Discharge plan remains appropriate    Co-evaluation    PT/OT/SLP Co-Evaluation/Treatment: Yes Reason for Co-Treatment: Complexity of the patient's impairments (multi-system involvement);For patient/therapist safety          AM-PAC OT "6 Clicks" Daily Activity     Outcome Measure   Help from another person eating meals?: None Help from another person taking care of personal grooming?: A Little Help from another person toileting, which includes using toliet, bedpan, or urinal?: A Lot Help from another person bathing (including washing, rinsing, drying)?: A Lot Help from another person to put on and taking off regular upper body clothing?: A Lot Help from another person to put on and taking off regular lower body clothing?: A Lot 6 Click Score: 15    End of Session Equipment Utilized During Treatment: Gait belt;Rolling walker  OT Visit Diagnosis: Unsteadiness on feet (R26.81);Muscle weakness (generalized) (M62.81);Other symptoms and signs involving cognitive function;Low vision, both eyes (H54.2)   Activity Tolerance Patient tolerated treatment well   Patient Left in bed;with call bell/phone within reach;with bed alarm set;with family/visitor present   Nurse Communication Mobility status        Time: 1328-1400 OT Time Calculation (min): 32 min  Charges: OT General Charges $OT Visit: 1 Visit OT Treatments $Therapeutic Activity: 8-22 mins  05/16/2021  Rich, OTR/L  Acute Rehabilitation Services  Office:  [phone]    Suzanna Obey 05/16/2021, 2:28 PM

## 2021-05-17 ENCOUNTER — Encounter (HOSPITAL_COMMUNITY): Payer: Self-pay | Admitting: Physical Medicine and Rehabilitation

## 2021-05-17 ENCOUNTER — Inpatient Hospital Stay (HOSPITAL_COMMUNITY)
Admission: RE | Admit: 2021-05-17 | Discharge: 2021-06-05 | DRG: 059 | Disposition: A | Discharge status: Home Health | Payer: Medicaid Other | Source: Intra-hospital | Attending: Physical Medicine and Rehabilitation | Admitting: Physical Medicine and Rehabilitation

## 2021-05-17 ENCOUNTER — Other Ambulatory Visit: Payer: Self-pay

## 2021-05-17 DIAGNOSIS — G360 Neuromyelitis optica [Devic]: Secondary | ICD-10-CM | POA: Diagnosis present

## 2021-05-17 DIAGNOSIS — N1831 Chronic kidney disease, stage 3a: Secondary | ICD-10-CM | POA: Diagnosis not present

## 2021-05-17 DIAGNOSIS — R000 Tachycardia, unspecified: Secondary | ICD-10-CM | POA: Diagnosis not present

## 2021-05-17 DIAGNOSIS — A048 Other specified bacterial intestinal infections: Secondary | ICD-10-CM | POA: Diagnosis present

## 2021-05-17 DIAGNOSIS — E876 Hypokalemia: Secondary | ICD-10-CM | POA: Diagnosis not present

## 2021-05-17 DIAGNOSIS — I959 Hypotension, unspecified: Secondary | ICD-10-CM | POA: Diagnosis not present

## 2021-05-17 DIAGNOSIS — E669 Obesity, unspecified: Secondary | ICD-10-CM | POA: Diagnosis present

## 2021-05-17 DIAGNOSIS — R3915 Urgency of urination: Secondary | ICD-10-CM | POA: Diagnosis present

## 2021-05-17 DIAGNOSIS — E039 Hypothyroidism, unspecified: Secondary | ICD-10-CM | POA: Diagnosis present

## 2021-05-17 DIAGNOSIS — R339 Retention of urine, unspecified: Secondary | ICD-10-CM | POA: Diagnosis not present

## 2021-05-17 DIAGNOSIS — E519 Thiamine deficiency, unspecified: Secondary | ICD-10-CM | POA: Diagnosis present

## 2021-05-17 DIAGNOSIS — N183 Chronic kidney disease, stage 3 unspecified: Secondary | ICD-10-CM | POA: Diagnosis present

## 2021-05-17 DIAGNOSIS — B9620 Unspecified Escherichia coli [E. coli] as the cause of diseases classified elsewhere: Secondary | ICD-10-CM | POA: Diagnosis present

## 2021-05-17 DIAGNOSIS — N390 Urinary tract infection, site not specified: Secondary | ICD-10-CM | POA: Diagnosis present

## 2021-05-17 DIAGNOSIS — H538 Other visual disturbances: Secondary | ICD-10-CM | POA: Diagnosis not present

## 2021-05-17 DIAGNOSIS — G4700 Insomnia, unspecified: Secondary | ICD-10-CM | POA: Diagnosis not present

## 2021-05-17 DIAGNOSIS — Z6831 Body mass index (BMI) 31.0-31.9, adult: Secondary | ICD-10-CM | POA: Diagnosis not present

## 2021-05-17 DIAGNOSIS — F419 Anxiety disorder, unspecified: Secondary | ICD-10-CM | POA: Diagnosis not present

## 2021-05-17 DIAGNOSIS — I10 Essential (primary) hypertension: Secondary | ICD-10-CM

## 2021-05-17 DIAGNOSIS — R5381 Other malaise: Secondary | ICD-10-CM | POA: Diagnosis present

## 2021-05-17 DIAGNOSIS — M7918 Myalgia, other site: Secondary | ICD-10-CM | POA: Diagnosis not present

## 2021-05-17 DIAGNOSIS — M7989 Other specified soft tissue disorders: Secondary | ICD-10-CM | POA: Diagnosis not present

## 2021-05-17 DIAGNOSIS — I129 Hypertensive chronic kidney disease with stage 1 through stage 4 chronic kidney disease, or unspecified chronic kidney disease: Secondary | ICD-10-CM | POA: Diagnosis present

## 2021-05-17 DIAGNOSIS — F411 Generalized anxiety disorder: Secondary | ICD-10-CM | POA: Diagnosis not present

## 2021-05-17 LAB — CBC
HCT: 35.9 % — ABNORMAL LOW (ref 36.0–46.0)
Hemoglobin: 11.5 g/dL — ABNORMAL LOW (ref 12.0–15.0)
MCH: 28.9 pg (ref 26.0–34.0)
MCHC: 32 g/dL (ref 30.0–36.0)
MCV: 90.2 fL (ref 80.0–100.0)
Platelets: 186 10*3/uL (ref 150–400)
RBC: 3.98 MIL/uL (ref 3.87–5.11)
RDW: 21.1 % — ABNORMAL HIGH (ref 11.5–15.5)
WBC: 11.6 10*3/uL — ABNORMAL HIGH (ref 4.0–10.5)
nRBC: 0 % (ref 0.0–0.2)

## 2021-05-17 LAB — BASIC METABOLIC PANEL
Anion gap: 6 (ref 5–15)
BUN: 9 mg/dL (ref 6–20)
CO2: 23 mmol/L (ref 22–32)
Calcium: 8.7 mg/dL — ABNORMAL LOW (ref 8.9–10.3)
Chloride: 114 mmol/L — ABNORMAL HIGH (ref 98–111)
Creatinine, Ser: 0.87 mg/dL (ref 0.44–1.00)
GFR, Estimated: >60 mL/min (ref >60)
Glucose, Bld: 100 mg/dL — ABNORMAL HIGH (ref 70–99)
Potassium: 4 mmol/L (ref 3.5–5.1)
Sodium: 143 mmol/L (ref 135–145)

## 2021-05-17 MED ORDER — BOOST / RESOURCE BREEZE PO LIQD CUSTOM
1.0000 | Freq: Three times a day (TID) | ORAL | Status: DC
  Administered 2021-05-17 – 2021-05-23 (×16): 1 via ORAL

## 2021-05-17 MED ORDER — SENNOSIDES-DOCUSATE SODIUM 8.6-50 MG PO TABS: 2.0000 | ORAL_TABLET | Freq: Every day | ORAL | Status: DC

## 2021-05-17 MED ORDER — AMOXICILLIN 500 MG PO CAPS: 1000.0000 mg | ORAL_CAPSULE | Freq: Two times a day (BID) | ORAL | Status: DC

## 2021-05-17 MED ORDER — SENNOSIDES-DOCUSATE SODIUM 8.6-50 MG PO TABS
2.0000 | ORAL_TABLET | Freq: Every day | ORAL | Status: DC
  Administered 2021-05-17: 2 via ORAL
  Filled 2021-05-17: qty 2

## 2021-05-17 MED ORDER — DIPHENHYDRAMINE HCL 25 MG PO CAPS: 25.0000 mg | ORAL_CAPSULE | Freq: Four times a day (QID) | ORAL | Status: DC | PRN

## 2021-05-17 MED ORDER — ACETAMINOPHEN 325 MG PO TABS
650.0000 mg | ORAL_TABLET | ORAL | Status: DC | PRN
  Administered 2021-05-23 – 2021-06-05 (×8): 650 mg via ORAL
  Filled 2021-05-17 (×8): qty 2

## 2021-05-17 MED ORDER — SENNOSIDES-DOCUSATE SODIUM 8.6-50 MG PO TABS
2.0000 | ORAL_TABLET | Freq: Every day | ORAL | Status: DC
  Administered 2021-05-18 – 2021-06-05 (×16): 2 via ORAL
  Filled 2021-05-17 (×19): qty 2

## 2021-05-17 MED ORDER — DIPHENHYDRAMINE HCL 25 MG PO CAPS
ORAL_CAPSULE | ORAL | Status: AC
  Filled 2021-05-17: qty 1

## 2021-05-17 MED ORDER — ACETAMINOPHEN 325 MG PO TABS: 650.0000 mg | ORAL_TABLET | ORAL | Status: DC | PRN

## 2021-05-17 MED ORDER — POLYETHYLENE GLYCOL 3350 17 G PO PACK: 17.0000 g | PACK | Freq: Two times a day (BID) | ORAL | Status: DC

## 2021-05-17 MED ORDER — ONDANSETRON HCL 4 MG/2ML IJ SOLN: 4.0000 mg | Freq: Four times a day (QID) | INTRAMUSCULAR | Status: DC | PRN

## 2021-05-17 MED ORDER — LEVOTHYROXINE SODIUM 100 MCG/5ML IV SOLN
50.0000 ug | Freq: Every day | INTRAVENOUS | Status: DC
  Filled 2021-05-17: qty 5

## 2021-05-17 MED ORDER — AMOXICILLIN 250 MG/5ML PO SUSR
1000.0000 mg | Freq: Two times a day (BID) | ORAL | Status: AC
  Administered 2021-05-17 – 2021-05-24 (×14): 1000 mg via ORAL
  Filled 2021-05-17 (×21): qty 20

## 2021-05-17 MED ORDER — CLARITHROMYCIN 500 MG PO TABS
500.0000 mg | ORAL_TABLET | Freq: Two times a day (BID) | ORAL | Status: AC
  Administered 2021-05-17 – 2021-05-24 (×14): 500 mg via ORAL
  Filled 2021-05-17 (×16): qty 1

## 2021-05-17 MED ORDER — POLYETHYLENE GLYCOL 3350 17 G PO PACK
17.0000 g | PACK | Freq: Two times a day (BID) | ORAL | Status: DC
  Administered 2021-05-17: 17 g via ORAL
  Filled 2021-05-17: qty 1

## 2021-05-17 MED ORDER — PANTOPRAZOLE SODIUM 40 MG PO TBEC
40.0000 mg | DELAYED_RELEASE_TABLET | Freq: Two times a day (BID) | ORAL | Status: DC
  Administered 2021-05-17 – 2021-06-05 (×38): 40 mg via ORAL
  Filled 2021-05-17 (×38): qty 1

## 2021-05-17 MED ORDER — CALCIUM CARBONATE ANTACID 500 MG PO CHEW
2.0000 | CHEWABLE_TABLET | ORAL | Status: AC
  Administered 2021-05-17: 400 mg via ORAL
  Filled 2021-05-17: qty 2

## 2021-05-17 MED ORDER — CALCIUM CARBONATE ANTACID 500 MG PO CHEW: 2.0000 | CHEWABLE_TABLET | ORAL | Status: DC

## 2021-05-17 MED ORDER — SODIUM CHLORIDE 0.9 % IV SOLN
INTRAVENOUS | Status: AC
  Filled 2021-05-17 (×3): qty 200

## 2021-05-17 MED ORDER — HEPARIN SODIUM (PORCINE) 1000 UNIT/ML IJ SOLN: 1000.0000 [IU] | Freq: Once | INTRAMUSCULAR | Status: DC

## 2021-05-17 MED ORDER — TRAZODONE HCL 50 MG PO TABS: 25.0000 mg | ORAL_TABLET | Freq: Every evening | ORAL | Status: DC | PRN

## 2021-05-17 MED ORDER — CALCIUM CARBONATE ANTACID 500 MG PO CHEW
CHEWABLE_TABLET | ORAL | Status: AC
  Filled 2021-05-17: qty 2

## 2021-05-17 MED ORDER — ADULT MULTIVITAMIN W/MINERALS CH
1.0000 | ORAL_TABLET | Freq: Every day | ORAL | Status: DC
  Administered 2021-05-18 – 2021-06-05 (×19): 1 via ORAL
  Filled 2021-05-17 (×19): qty 1

## 2021-05-17 MED ORDER — ACETAMINOPHEN 325 MG PO TABS
ORAL_TABLET | ORAL | Status: AC
  Filled 2021-05-17: qty 2

## 2021-05-17 MED ORDER — ACD FORMULA A 0.73-2.45-2.2 GM/100ML VI SOLN: 1000.0000 mL | Status: DC

## 2021-05-17 MED ORDER — BISACODYL 10 MG RE SUPP
10.0000 mg | Freq: Once | RECTAL | Status: AC
  Administered 2021-05-17: 10 mg via RECTAL
  Filled 2021-05-17: qty 1

## 2021-05-17 MED ORDER — TAMSULOSIN HCL 0.4 MG PO CAPS
0.4000 mg | ORAL_CAPSULE | Freq: Every day | ORAL | Status: DC
  Administered 2021-05-17 – 2021-06-04 (×19): 0.4 mg via ORAL
  Filled 2021-05-17 (×19): qty 1

## 2021-05-17 MED ORDER — ONDANSETRON HCL 4 MG PO TABS: 4.0000 mg | ORAL_TABLET | Freq: Four times a day (QID) | ORAL | Status: DC | PRN

## 2021-05-17 MED ORDER — METOPROLOL SUCCINATE ER 25 MG PO TB24
25.0000 mg | ORAL_TABLET | Freq: Every day | ORAL | Status: DC
  Administered 2021-05-18 – 2021-05-21 (×4): 25 mg via ORAL
  Filled 2021-05-17 (×4): qty 1

## 2021-05-17 MED ORDER — BETHANECHOL CHLORIDE 10 MG PO TABS
10.0000 mg | ORAL_TABLET | Freq: Three times a day (TID) | ORAL | Status: DC
  Administered 2021-05-17 – 2021-06-03 (×50): 10 mg via ORAL
  Filled 2021-05-17 (×51): qty 1

## 2021-05-17 MED ORDER — POLYETHYLENE GLYCOL 3350 17 G PO PACK
17.0000 g | PACK | Freq: Two times a day (BID) | ORAL | Status: DC
  Administered 2021-05-17 – 2021-06-05 (×34): 17 g via ORAL
  Filled 2021-05-17 (×37): qty 1

## 2021-05-17 MED ORDER — CALCIUM GLUCONATE-NACL 2-0.675 GM/100ML-% IV SOLN
2.0000 g | INTRAVENOUS | Status: DC
  Filled 2021-05-17: qty 100

## 2021-05-17 MED ORDER — ACD FORMULA A 0.73-2.45-2.2 GM/100ML VI SOLN
Status: AC
  Administered 2021-05-17: 1000 mL
  Filled 2021-05-17: qty 500

## 2021-05-17 MED ORDER — AMOXICILLIN 250 MG/5ML PO SUSR
1000.0000 mg | Freq: Two times a day (BID) | ORAL | Status: DC
  Filled 2021-05-17 (×2): qty 20

## 2021-05-17 MED ORDER — ADULT MULTIVITAMIN W/MINERALS CH: 1.0000 | ORAL_TABLET | Freq: Every day | ORAL | Status: DC

## 2021-05-17 MED ORDER — CLARITHROMYCIN 500 MG PO TABS: 500.0000 mg | ORAL_TABLET | Freq: Two times a day (BID) | ORAL | Status: DC

## 2021-05-17 MED ORDER — CALCIUM GLUCONATE-NACL 2-0.675 GM/100ML-% IV SOLN
INTRAVENOUS | Status: AC
  Administered 2021-05-17: 2000 mg via INTRAVENOUS
  Filled 2021-05-17: qty 100

## 2021-05-17 MED ORDER — HEPARIN SODIUM (PORCINE) 1000 UNIT/ML IJ SOLN
INTRAMUSCULAR | Status: AC
  Administered 2021-05-17: 1000 [IU]
  Filled 2021-05-17: qty 3

## 2021-05-17 NOTE — Progress Notes (Signed)
Inpatient Rehabilitation Medication Review by a Pharmacist  A complete drug regimen review was completed for this patient to identify any potential clinically significant medication issues.  Clinically significant medication issues were identified:  no  Check AMION for pharmacist assigned to patient if future medication questions/issues arise during this admission.  Pharmacist comments:   Time spent performing this drug regimen review (minutes):  20   Signe Colt, PharmD 05/17/2021 8:41 PM

## 2021-05-17 NOTE — Progress Notes (Signed)
Standley BrookingBoyette, Arinze Rivadeneira G, RN  Rehab Admission Coordinator  Physical Medicine and Rehabilitation  PMR Pre-admission     Signed  Date of Service:  05/17/2021 11:03 AM       Related encounter: Admission (Current) from 05/03/2021 in [facility]       Signed          Show:Clear all [x] Written[x] Templated[x] Copied  Added by: [x] Standley BrookingBoyette, Elyas Villamor G, RN   [] Hover for details                                                                                                                                                                                                              PMR Admission Coordinator Pre-Admission Assessment   Patient: Diane Todd is an 20 y.o., female MRN: [id number] DOB: January 14, 2001 Height: 5\' 7"  (170.2 cm) Weight: 96 kg                                                                                                                                                  Insurance Information HMO:     PPO:      PCP:      IPA:      80/20:      OTHER: PRIMARY: [employer] Medicaid      Policy# [id number]    Subscriber: pt CM Name: vis fax approval      Phone#: [phone]     Fax#: [phone] Pre-Cert#: [id number] approved for 7 days      Employer: Benefits:  Phone #: [phone]     Name: 6/29 Eff. Date: active     Deduct:       Out of Pocket Max:         CIR: per medicaid guidelines      SNF: per medicaid Outpatient: per medicaid     Co-Pay: [facility]  Co-Pay: DME: per medicaid     Co-Pay: Providers: in network  SECONDARY: none      Policy#:       Phone#:   Artist:       Phone#:    The Data processing manager" for patients in Inpatient Rehabilitation Facilities with attached "Privacy Act Statement-Health Care Records" was provided and verbally reviewed with: N/A   Emergency Contact Information Contact Information       Name  Relation Home Work Mobile    olguin,isis Sister     [phone]         Current Medical History  Patient Admitting Diagnosis: neuromyelitis optica   History of present illness: Diane Todd is a 20 year old right-handed female with history of hypertension, tobacco use, CKD stage III,  as well as thyroid disorder.  Prior to 4/22 was working at [employer] going to [employer]  Presented 05/03/2021 with generalized weakness poor appetite/nausea and vomiting as well as blurred vision x2 weeks.  Admission chemistries BUN 22 creatinine 1.65, sedimentation rate of 1, hemoglobin 18.8, RBC 6.71, TSH 5.224.  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  MRI of the brain showed hyperintense T2 weighted signal and mild contrast-enhancement within the medial thalami and periaqueductal gray matter.  Demyelinating disease remained a primary consideration.  MRI of the orbits with and without contrast were considered.  CSF sampling also considered.  No demyelinating lesion of the cervical thoracic cord noted.  MRI of the orbits with and without contrast showed normal appearance of the orbits.  MRI cervical thoracic spine showed T7-8 small right subarticular disc protrusion without stenosis.  T8-9 small left subarticular disc protrusion without stenosis.  Renal ultrasound showed no hydronephrosis.  Neurology follow-up work-up for NMO recommended serum for AQ P4-IgG antibody pending, MOG-IgG antibody negative.  Lumbar puncture completed 05/08/2021-unremarkable no growth.  She did complete a 5-day course of high-dose steroids and 3 days of high-dose thiamine.  Started on plasmapheresis with plans of 5 total sessions last session 05/17/2021.  Ophthalmology was consulted Dr. Luciana Axe with a funduscopic exam completed that showed papillary retinal edema appeared concerning for elevated ICP and they recommended no further intervention at this  time given minimal fundus findings they did recommend a follow-up neuro-ophthalmology at a tertiary center.  Patient with history of H. pylori treatment initiated 05/10/2021 with amoxicillin/Biaxin twice daily, PPI twice daily for 14 days total.  A follow-up urine study 05/07/2021 positive for ESBL E. coli discussed with infectious disease pharmacy initially on meropenem to 1 dose of fosfomycin and completed treatment and remains on contact precautions.  Patient has had bouts of urinary retention requiring in and out catheterization with Foley catheter tube recently removed.  ve rehab program.   Past Medical History      Past Medical History:  Diagnosis Date   Hypertension     Hypothyroidism      Family History  family history is not on file.   Prior Rehab/Hospitalizations:  Has the patient had prior rehab or hospitalizations prior to admission? Yes   Has the patient had major surgery during 100 days prior to admission? No   Current Medications    Current Facility-Administered Medications:   heparin sodium (porcine) 1000 UNIT/ML injection, , , ,   acetaminophen (TYLENOL) 325 MG tablet, , , ,   acetaminophen (TYLENOL) tablet 650 mg, 650 mg, Oral, Q4H PRN, Erick Blinks, MD   amoxicillin (AMOXIL) 250 MG/5ML suspension 1,000 mg,  1,000 mg, Oral, Q12H, Hongalgi, Anand D, MD, 1,000 mg at 05/16/21 2305   bisacodyl (DULCOLAX) suppository 10 mg, 10 mg, Rectal, Once, Hongalgi, Maximino Greenland, MD   calcium carbonate (TUMS - dosed in mg elemental calcium) 500 MG chewable tablet, , , ,   calcium carbonate (TUMS - dosed in mg elemental calcium) chewable tablet 400 mg of elemental calcium, 2 tablet, Oral, Q3H, Erick Blinks, MD   calcium gluconate 2 g/ 100 mL sodium chloride IVPB, 2 g, Intravenous, To Hemo, Erick Blinks, MD   calcium gluconate 2-0.675 GM/100ML-% IVPB, , , ,   Chlorhexidine Gluconate Cloth 2 % PADS 6 each, 6 each, Topical, Daily, Venora Maples, MD, 6 each at 05/15/21 1800    citrate dextrose (ACD-A anticoagulant) 0.73-2.45-2.2 GM/100ML solution, , , ,   citrate dextrose (ACD-A anticoagulant) solution 1,000 mL, 1,000 mL, Other, Continuous, Derry Lory, Salman, MD   clarithromycin (BIAXIN) tablet 500 mg, 500 mg, Oral, Q12H, Spongberg, Susy Frizzle, MD, 500 mg at 05/16/21 2114   diphenhydrAMINE (BENADRYL) 25 mg capsule, , , ,   diphenhydrAMINE (BENADRYL) capsule 25 mg, 25 mg, Oral, Q6H PRN, Erick Blinks, MD   feeding supplement (BOOST / RESOURCE BREEZE) liquid 1 Container, 1 Container, Oral, TID BM, Spongberg, Susy Frizzle, MD, 1 Container at 05/16/21 2114   heparin sodium (porcine) injection 1,000 Units, 1,000 Units, Intracatheter, Once, Erick Blinks, MD   levothyroxine (SYNTHROID, LEVOTHROID) injection 50 mcg, 50 mcg, Intravenous, Daily, Marinda Elk, MD, 50 mcg at 05/16/21 1249   metoprolol succinate (TOPROL-XL) 24 hr tablet 25 mg, 25 mg, Oral, Daily, Marinda Elk, MD, 25 mg at 05/16/21 1209   multivitamin with minerals tablet 1 tablet, 1 tablet, Oral, Daily, Spongberg, Susy Frizzle, MD, 1 tablet at 05/16/21 1210   ondansetron (ZOFRAN) tablet 4 mg, 4 mg, Oral, Q6H PRN **OR** ondansetron (ZOFRAN) injection 4 mg, 4 mg, Intravenous, Q6H PRN, Venora Maples, MD, 4 mg at 05/14/21 2321   pantoprazole (PROTONIX) EC tablet 40 mg, 40 mg, Oral, BID, Spongberg, Susy Frizzle, MD, 40 mg at 05/16/21 2113   polyethylene glycol (MIRALAX / GLYCOLAX) packet 17 g, 17 g, Oral, BID, Hongalgi, Maximino Greenland, MD   senna-docusate (Senokot-S) tablet 2 tablet, 2 tablet, Oral, Daily, Hongalgi, Anand D, MD   traZODone (DESYREL) tablet 25 mg, 25 mg, Oral, QHS PRN, Venora Maples, MD   Patients Current Diet:  Diet Order                  DIET DYS 3 Room service appropriate? Yes; Fluid consistency: Thin  Diet effective now                       Precautions / Restrictions Precautions Precautions: Fall Precaution Comments: pt fell at home  PTA Restrictions Weight Bearing Restrictions: No    Has the patient had 2 or more falls or a fall with injury in the past year?No   Prior Activity Level Community (5-7x/wk): decline in function over past 2 months pta. Required extensive help 2 weeks pta.    Prior Functional Level Prior Function Level of Independence: Independent Comments: prior to 4/22 pt was working at [employer] and going to [employer] Since 4/22 pt has been mostly in bed and getting weaker and weaker, family has been helping with ambulation and ADL's   Self Care: Did the patient need help bathing, dressing, using the toilet or eating?  Independent   Indoor Mobility: Did the patient need assistance with walking  from room to room (with or without device)? Independent   Stairs: Did the patient need assistance with internal or external stairs (with or without device)? Independent   Functional Cognition: Did the patient need help planning regular tasks such as shopping or remembering to take medications? Independent   Home Assistive Devices / Equipment Home Assistive Devices/Equipment: Wheelchair Home Equipment: Wheelchair - manual, Shower seat   Prior Device Use: Indicate devices/aids used by the patient prior to current illness, exacerbation or injury? None of the above   Current Functional Level Cognition   Overall Cognitive Status: Impaired/Different from baseline Difficult to assess due to: Level of arousal Current Attention Level: Selective Orientation Level: Oriented X4 Following Commands: Follows one step commands consistently, Follows one step commands with increased time Safety/Judgement: Decreased awareness of safety, Decreased awareness of deficits General Comments: requiring increased time to process commands. Improved awareness of deficits and need for assistance. STM deficits still prevalent with patient having difficulty remembering what she ordered for lunch    Extremity Assessment (includes  Sensation/Coordination)   Upper Extremity Assessment: Generalized weakness RUE Deficits / Details: Limited to 80* FF, elbow ROM limited by IV, wrist and hand flex/ext, WFLs, poor grip; 3+/5  strength RUE Coordination: decreased fine motor, decreased gross motor LUE Deficits / Details: shoulder FF limited to 80*; AROM elbow through digits, WFLs fair grip strength 4-/5 throughout LUE Coordination: decreased fine motor, decreased gross motor  Lower Extremity Assessment: Defer to PT evaluation RLE Deficits / Details: decreased ROM RLE Sensation:  ("tingling") RLE Coordination: decreased gross motor, decreased fine motor LLE Deficits / Details: hip flex 2+/5, knee ext 2+/5, ankle df 2+/5 LLE Sensation:  ("tingling") LLE Coordination: decreased fine motor, decreased gross motor     ADLs   Overall ADL's : Needs assistance/impaired Eating/Feeding: Set up, Bed level Grooming: Wash/dry face, Wash/dry hands, Minimal assistance, Sitting, Moderate assistance Grooming Details (indicate cue type and reason): Min-Mod A for balance while sitting on sara stedy at sink. Cues for sequencing washing her face and hands. Upper Body Bathing: Total assistance, Bed level Lower Body Bathing: Total assistance Upper Body Dressing : Maximal assistance Lower Body Dressing: Total assistance Toilet Transfer: +2 for physical assistance, +2 for safety/equipment, BSC, Maximal assistance Toilet Transfer Details (indicate cue type and reason): staff to use lift equip Toileting- Clothing Manipulation and Hygiene: Moderate assistance, +2 for physical assistance, Sit to/from stand Toileting - Clothing Manipulation Details (indicate cue type and reason): Mod A for posterior peri care. Min-Mod A for balance in standing. Pt particiapting in performing both naterioer and posterior peri care Functional mobility during ADLs: Maximal assistance, Minimal assistance, +2 for physical assistance (sit<>stand with stedy) General ADL  Comments: Pt performing sit<>stand with stedy, grooming tasks at sink, and washing up peri area at sink.     Mobility   Overal bed mobility: Needs Assistance Bed Mobility: Rolling, Sidelying to Sit, Sit to Sidelying Rolling: Min assist Sidelying to sit: Min assist, Mod assist Supine to sit: Mod assist Sit to supine: Max assist Sit to sidelying: Min assist General bed mobility comments: required minA to roll and bring LEs off bed then trunk elevation. Increased time to perform due to fatigue     Transfers   Overall transfer level: Needs assistance Equipment used: Rolling walker (2 wheeled) Transfer via Lift Equipment: Stedy Transfers: Sit to/from [employer] to Stand: Min assist, +2 physical assistance Stand pivot transfers: Min assist, +2 physical assistance  Lateral/Scoot Transfers: Max assist General transfer comment: minA+2 to rise  into stand. Cues for hand placement. Increased time to performa and max encouragement due to fear of falling     Ambulation / Gait / Stairs / Wheelchair Mobility   Ambulation/Gait Ambulation/Gait assistance: Min assist, +2 physical assistance, +2 safety/equipment Gait Distance (Feet): 10 Feet (x5', x5') Assistive device: Rolling walker (2 wheeled) Gait Pattern/deviations: Step-through pattern, Decreased stride length, Decreased weight shift to right, Decreased weight shift to left, Shuffle, Trunk flexed, Wide base of support General Gait Details: Ambulated fwd/bwd 10' with RW requiring minA+2 for balance and RW management. Short bouts 2 x 5' with minA+2 for similar set up. Prolonged rest break required between each bout but patient motivated at progress made Gait velocity: decreased     Posture / Balance Dynamic Sitting Balance Sitting balance - Comments: close supervision for safety Balance Overall balance assessment: Needs assistance Sitting-balance support: Feet supported, Bilateral upper extremity supported Sitting balance-Leahy Scale:  Fair Sitting balance - Comments: close supervision for safety Standing balance support: Bilateral upper extremity supported, During functional activity Standing balance-Leahy Scale: Poor Standing balance comment: reliant on UE support and external assist     Special needs/care consideration I and O caths Patient English speaking, Mom requires interpreter Completed PLEX 7/1 Sister staying 24/7 for support at bedside    Previous Home Environment  Living Arrangements: Parent, Other relatives (sister)  Lives With: Family (parents and siblings) Available Help at Discharge: Family, Available 24 hours/day Type of Home: House Home Layout: One level Home Access: Stairs to enter [employer] of Steps: 5 Bathroom Shower/Tub: Engineer, manufacturing systems: Standard Bathroom Accessibility: Yes How Accessible: Accessible via walker Home Care Services: No Additional Comments: pt's older sister was present on eval and answered many questions as pt was very lethargic.   Discharge Living Setting Plans for Discharge Living Setting: Patient's home, Lives with (comment) (parents and siblings) Type of Home at Discharge: House Discharge Home Layout: One level Discharge Home Access: Stairs to enter Entrance Stairs-Rails: None Entrance Stairs-Number of Steps: 5 Discharge Bathroom Shower/Tub: Tub/shower unit Discharge Bathroom Toilet: Standard Discharge Bathroom Accessibility: Yes How Accessible: Accessible via walker Does the patient have any problems obtaining your medications?: No   Social/Family/Support Systems Contact Information: sister, Isis speaks [place]. Call Mom with update with spanish interpreter Anticipated Caregiver: family Anticipated Caregiver's Contact Information: see above Ability/Limitations of Caregiver: parents work, but family will arrnage 24/7 min assist Caregiver Availability: 24/7 Discharge Plan Discussed with Primary Caregiver: Yes Is Caregiver In Agreement  with Plan?: Yes Does Caregiver/Family have Issues with Lodging/Transportation while Pt is in Rehab?: No   Goals Patient/Family Goal for Rehab: supervision to min assist with PT and OT Expected length of stay: ELOS 2 weeks Cultural Considerations: SPanish but patient is ENglish speaking, Mom requires interpreter Additional Information: Call Mom with updates with interpreter Pt/Family Agrees to Admission and willing to participate: Yes Program Orientation Provided & Reviewed with Pt/Caregiver Including Roles  & Responsibilities: Yes   Decrease burden of Care through IP rehab admission: n/a   Possible need for SNF placement upon discharge:not anticipated   Patient Condition: This patient's medical and functional status has changed since the consult dated: 05/06/2021 in which the Rehabilitation Physician determined and documented that the patient's condition is appropriate for intensive rehabilitative care in an inpatient rehabilitation facility. See "History of Present Illness" (above) for medical update. Functional changes are: min to mod assist. Patient's medical and functional status update has been discussed with the Rehabilitation physician and patient remains appropriate for inpatient rehabilitation. Will  admit to inpatient rehab today.   Preadmission Screen Completed By:  Clois Dupes, RN, 05/17/2021 11:03 AM ______________________________________________________________________   Discussed status with Dr. Riley Kill on 05/17/2021 at  1115 and received approval for admission today.   Admission Coordinator:  Clois Dupes, time 0454 Date 05/17/2021           Cosigned by: Ranelle Oyster, MD at 05/17/2021 11:24 AM    Revision History                    Note Details  Author Standley Brooking, RN File Time 05/17/2021 11:19 AM  Author Type Rehab Admission Coordinator Status Signed  Last Editor Standley Brooking, RN Service Physical Medicine and Rehabilitation

## 2021-05-17 NOTE — Progress Notes (Signed)
Horton Chin, MD   Physician  Physical Medicine and Rehabilitation  Consult Note     Signed  Date of Service:  05/06/2021  9:57 AM       Related encounter: Admission (Current) from 05/03/2021 in [facility]       Signed      Expand All Collapse All     Show:Clear all [x] Written[x] Templated[] Copied  Added by: [x] Angiulli, , PA-C[x] Raulkar, , MD   [] Hover for details                                                                                                                                                                                                                                                                                                                       Physical Medicine and Rehabilitation Consult Reason for Consult: Blurred vision Referring Physician: Dr. Mcarthur Rossetti     HPI: Diane Todd is a 20 y.o. right-handed female with history of hypertension, CKD stage III as well as thyroid disorder.  Per chart review patient lives with parent.  Independent prior to admission.  1 level home 5 steps to entry.  Prior to 4/22 was working at [employer] going to [employer].  Presented 05/03/2021 with generalized weakness poor appetite /nausea and vomiting as well as blurred vision x2 weeks.  Initial work-up at outside hospital [facility] showed lesions highly suggestive of NMSOD.  MRI of the brain showed hyperintense T2 weighted signal and mild contrast-enhancement within the medial thalami and periaqueductal gray matter.  Demyelinating disease remained a primary consideration.  MRI of the orbits with and without contrast were considered.  CSF sampling was considered.  No demyelinating lesion of the cervical thoracic cord noted.  MRI of the orbits with and without contrast showed normal appearance of the orbits.  MRI cervical thoracic  spine showed T7-8 small right subarticular disc protrusion without stenosis.  T8-9 small left subarticular disc protrusion without stenosis.  Renal ultrasound showed no hydronephrosis.  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  Therapy evaluations completed due to patient decreased functional mobility recommendations of physical medicine rehab consult. Sister at bedside would prefer CIR for patient.      Review of Systems Constitutional:  Positive for malaise/fatigue. Negative for fever. Eyes:  Positive for blurred vision. Negative for photophobia. Respiratory:  Negative for cough and shortness of breath.   Cardiovascular:  Negative for chest pain, palpitations and leg swelling. Gastrointestinal:  Positive for constipation, nausea and vomiting. Genitourinary:  Negative for dysuria, flank pain and hematuria. Musculoskeletal:  Positive for myalgias. Skin:  Negative for rash. Neurological:  Positive for weakness.  All other systems reviewed and are negative.     Past Medical History:  Diagnosis Date   Hypertension     Hypothyroidism      History reviewed. No pertinent surgical history. History reviewed. No pertinent family history. Social History:  reports that she has been smoking e-cigarettes. She has never used smokeless tobacco. She reports previous alcohol use. She reports previous drug use. Allergies: No Known Allergies       Medications Prior to Admission  Medication Sig Dispense Refill   amoxicillin (AMOXIL) 500 MG capsule Take 1,000 mg by mouth every 12 (twelve) hours. Take for 14 days. (Patient not taking: Reported on 05/03/2021)       clarithromycin (BIAXIN) 500 MG tablet Take 500 mg by mouth every 12 (twelve) hours. Take for 14 days. (Patient not taking: Reported on 05/03/2021)       levothyroxine (SYNTHROID) 100 MCG tablet Take 100 mcg by mouth daily. (Patient not taking: Reported on 05/03/2021)       metoprolol succinate (TOPROL-XL) 25 MG 24 hr  tablet Take 25 mg by mouth daily. (Patient not taking: Reported on 05/03/2021)       ondansetron (ZOFRAN ODT) 4 MG disintegrating tablet Take 1 tablet (4 mg total) by mouth every 8 (eight) hours as needed for nausea or vomiting. (Patient not taking: Reported on 05/03/2021) 10 tablet 0   pantoprazole (PROTONIX) 40 MG tablet Take 40 mg by mouth 2 (two) times daily. (Patient not taking: Reported on 05/03/2021)       sucralfate (CARAFATE) 1 g tablet Take 1 g by mouth every 6 (six) hours. Take for 14 days. (Patient not taking: Reported on 05/03/2021)          Home: Home Living Family/patient expects to be discharged to:: Private residence Living Arrangements: Parent, Other relatives (sister) Available Help at Discharge: Family, Available 24 hours/day Type of Home: House Home Access: Stairs to enter [employer] of Steps: 5 Home Layout: One level Bathroom Shower/Tub: Tub/shower unit Home Equipment: Wheelchair - manual, Shower seat Additional Comments: pt's older sister was present on eval and answered many questions as pt was very lethargic.  Functional History: Prior Function Level of Independence: Independent Comments: prior to 4/22 pt was working at [employer] and going to [employer] Since 4/22 pt has been mostly in bed and getting weaker and weaker, family has been helping with ambulation and ADL's Functional Status:  Mobility: Bed Mobility Overal bed mobility: Needs Assistance Bed Mobility: Supine to Sit Supine to sit: Max assist General bed mobility comments: Unable to test; pt fatigued and about to go for lumbar puncture/MRI. Transfers Overall transfer level: Needs assistance Transfers: Lateral/Scoot Transfers, Sit to/from Stand Sit to Stand: Max assist  Lateral/Scoot Transfers: Max assist General transfer comment: DNT Ambulation/Gait General Gait Details: unable   ADL: ADL Overall ADL's : Needs assistance/impaired  Eating/Feeding: NPO Grooming: Maximal assistance, Bed  level Upper Body Bathing: Total assistance, Bed level Lower Body Bathing: Total assistance Upper Body Dressing : Maximal assistance Lower Body Dressing: Total assistance Toilet Transfer: Total assistance, +2 for physical assistance, +2 for safety/equipment Toilet Transfer Details (indicate cue type and reason): staff to use lift equip Toileting- Clothing Manipulation and Hygiene: Total assistance Functional mobility during ADLs: Total assistance, +2 for physical assistance, +2 for safety/equipment General ADL Comments: Pt very limited by decreased arousal, decreased visual acuity/perception, decreased strength and decreased ability to care for self. Pt's sister in room and very knowledgable about pt's PLOF.   Cognition: Cognition Overall Cognitive Status: Difficult to assess Orientation Level: Oriented to person, Oriented to place, Disoriented to time, Disoriented to situation Cognition Arousal/Alertness: Lethargic Behavior During Therapy: Flat affect Overall Cognitive Status: Difficult to assess Area of Impairment: Attention, Memory, Following commands, Safety/judgement, Awareness, Problem solving Current Attention Level: Focused Memory: Decreased short-term memory Following Commands: Follows one step commands inconsistently, Follows one step commands with increased time Safety/Judgement: Decreased awareness of safety Awareness: Intellectual Problem Solving: Slow processing, Decreased initiation, Difficulty sequencing, Requires verbal cues, Requires tactile cues General Comments: Pt keeping eyes closed, but following commands. Difficult to assess due to: Level of arousal   Blood pressure 117/79, pulse (!) 55, temperature 97.6 F (36.4 C), temperature source Oral, resp. rate 20, height 5\' 7"  (1.702 m), weight 98.6 kg, SpO2 97 %. Physical Exam Gen: no distress, normal appearing HEENT:  Bilateral ptosis, impaired EOM Cardio: Bradycardic Chest: normal effort, normal rate of  breathing Abd: soft, non-distended Ext: no edema Psych: pleasant, normal affect Skin: intact Neurological:    Comments: Patient is alert in no acute distress.  Makes eye contact with examiner and follows commands.  Oriented to person and place. LE 4/5, UE 5/5. Impaired FTN b/l.    Lab Results Last 24 Hours  No results found for this or any previous visit (from the past 24 hour(s)).    Imaging Results (Last 48 hours)  RENAL   Result Date: 05/04/2021 CLINICAL DATA:  Chronic renal disease. EXAM: RENAL / URINARY TRACT ULTRASOUND COMPLETE COMPARISON:  CT abdomen and pelvis March 14, 2021. FINDINGS: Right Kidney: Renal measurements: 10.2 x 4.2 x 5.0 cm = volume: 112.0 mL. Mild renal cortical thinning. No hydronephrosis. Left Kidney: Renal measurements: May 0.4 x 4.5 x 3.9 cm = volume: 76.7 mL. Mild renal cortical thinning. No hydronephrosis. Bladder: Decompressed with Foley catheter. Other: None. IMPRESSION: No hydronephrosis. Electronically Signed   By: March 16, 2021 M.D.   On: 05/04/2021 12:58   MR ORBITS W WO CONTRAST   Result Date: 05/05/2021 CLINICAL DATA:  Optic neuritis suspected. EXAM: MRI OF THE ORBITS WITHOUT AND WITH CONTRAST TECHNIQUE: Multiplanar, multi-echo pulse sequences of the orbits and surrounding structures were acquired including fat saturation techniques, before and after intravenous contrast administration. CONTRAST:  34mL GADAVIST GADOBUTROL 1 MMOL/ML IV SOLN COMPARISON:  Abnormal MRI of the brain. FINDINGS: Orbits: Globes are within normal limits bilaterally. The optic nerves are unremarkable. Chiasm is within normal limits. Abnormal signal or enhancement is present. Extraocular muscles are within normal limits. Visualized sinuses: Polyp or mucous retention cyst is noted in the left maxillary sinus. The paranasal sinuses and mastoid air cells are otherwise clear. Soft tissues: Periorbital soft tissues and visualized facial soft tissues are within normal limits. 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 There is subtle enhancement along the periaqueductal gray matter and medial thalami. IMPRESSION:  1. Normal MRI appearance of the orbits. 2. Subtle enhancement along the periaqueductal gray matter and medial thalami bilaterally. This corresponds to the abnormal signal changes on the MRI from yesterday. In addition to a demyelinating process, or Wernicke encephalopathy (thiamine deficiency encephalopathy) is also considered. Electronically Signed   By: Marin Robertshristopher  Mattern M.D.   On: 05/05/2021 13:25         Assessment/Plan: Diagnosis: NMO Does the need for close, 24 hr/day medical supervision in concert with the patient's rehab needs make it unreasonable for this patient to be served in a less intensive setting? Yes Co-Morbidities requiring supervision/potential complications: bilateral ophtlmoparesis, slowed mentation, ataxia, vision loss, RLE spasticity Due to bladder management, bowel management, safety, skin/wound care, disease management, medication administration, pain management, and patient education, does the patient require 24 hr/day rehab nursing? Yes Does the patient require coordinated care of a physician, rehab nurse, therapy disciplines of PT, OT, SLP to address physical and functional deficits in the context of the above medical diagnosis(es)? Yes Addressing deficits in the following areas: balance, endurance, locomotion, strength, transferring, bowel/bladder control, bathing, dressing, feeding, grooming, toileting, cognition, speech, language, and psychosocial support Can the patient actively participate in an intensive therapy program of at least 3 hrs of therapy per day at least 5 days per week? Yes The potential for patient to make measurable gains while on inpatient rehab is excellent Anticipated functional outcomes upon discharge from inpatient rehab are min assist  with PT, min assist with OT, min assist  with SLP. Estimated rehab length of stay to reach the above functional goals is: 10-14 days Anticipated discharge destination: Home Overall Rehab/Functional Prognosis: good   RECOMMENDATIONS: This patient's condition is appropriate for continued rehabilitative care in the following setting: CIR Patient has agreed to participate in recommended program. Yes Note that insurance prior authorization may be required for reimbursement for recommended care.   Comment: Thank you for this consult. Admission coordinator to follow.   I have personally performed a face to face diagnostic evaluation, including, but not limited to relevant history and physical exam findings, of this patient and developed relevant assessment and plan.  Additionally, I have reviewed and concur with the physician assistant's documentation above.   Sula SodaKrutika Raulkar, MD     Charlton Amoraniel J Angiulli, PA-C 05/06/2021           Revision History                        Note Details  Author Horton Chinaulkar, Krutika P, MD File Time 05/06/2021  4:01 PM  Author Type Physician Status Signed  Last Editor Horton Chinaulkar, Krutika P, MD Service Physical Medicine and Rehabilitation

## 2021-05-17 NOTE — Discharge Summary (Addendum)
Physician Discharge Summary  Jacaria Colburn YQM:[id number] DOB: [birth year]  PCP: Pcp, No  Admitted from: Home Discharged to: [facility]  Admit date: 05/03/2021 Discharge date: 05/17/2021  Recommendations for Outpatient Follow-up:    Follow-up Information     MD at CIR Follow up.   Why: Will continue to follow.        Sater, Pearletha Furl, MD. Schedule an appointment as soon as possible for a visit in 4 week(s).   Specialty: Neurology Contact information: [address] [place] [address] [phone]         [facility]. Schedule an appointment as soon as possible for a visit in 2 week(s).   Contact information: [address] [place] [address] [phone]               Patient will need a PCP to follow upon discharge from [facility].  Social work at [employer] to [employer]. Also upon discharge from [employer], kindly assist with arranging follow-up with neuro ophthalmology at a tertiary center.  Home Health: None    Equipment/Devices: TBD at CIR    Discharge Condition: Improved and stable   Code Status: Full Code Diet recommendation:  Discharge Diet Orders (From admission, onward)     Start     Ordered   05/17/21 0000  Diet - low sodium heart healthy        05/17/21 1440             Discharge Diagnoses:  Active Problems:   Optic neuritis   Hypothyroidism   Helicobacter pylori gastritis   History of hypertension   Renal insufficiency   Protein-calorie malnutrition, severe   Brief Summary: Diane Todd is an 20 y.o. female past medical history of essential hypertension, hypothyroidism transferred from [facility] for concern after her MRI showed multiple brain abnormalities and she had been having generalized weakness poor appetite and blurry vision.   Neurology was consulted and appreciate assistance, recommended serum for AQP4-IgG antibody negative, MOG-IgG antibody negative. Completed 5 days of high-dose  steroids and 3 days of high-dose thiamine, c/w thiamine without significant improvement. Completed 5 days of Plex treatment on 05/17/2021 with clinical improvement. Physical therapy evaluated the patient recommended inpatient rehab. 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 Pregnancy test negative. Physical therapy evaluated the patient recommended inpatient rehab.   Speech therapy evaluated and recommend regular diet. Discussed with neurology and follow-up appreciated.  Patient endorsed some improvement in blurry vision at short distance, continued blurry vision and intermittent diplopia when trying to focus on distant objects.  They have cleared her for discharge to CIR and recommended outpatient follow-up with Dr. Epimenio Foot, Neurology in 4 weeks.   Essential hypertension: Blood pressures well controlled, on metoprolol.   Acute Kidney injury  Patient presented with a creatinine of 1.5 Resolved.   Hypothyroidism TSH of 0.3.  Continue Synthroid.  It appears that patient was not compliant with  any of her medications.  Advised compliance with all.   History of H. pylori: Treatment initated today 6/24 Amoxicillin 1g bid, clarithromycin  bid, ppi bid all for 14 days.  Outpatient follow-up with GI.   Cloudy urine, asymptomatic Urinalysis  indicates possible infection Since she is immunosuppressed, she was started on meropenem Does not appear that her repeat urine culture was done. Last urine culture from 05/07/21 was positive for ESBL E. Coli Prior hospitalist MD could not confirm if this was treated with antibiotics As per communication with ID pharmacist 6/29, felt appropriate to change from meropenem to a dose of fosfomycin-completed treatment course   Nutrition Status: Nutrition Problem: Severe Malnutrition Etiology: acute illness Signs/Symptoms: energy intake < or equal to 50% for > or equal to 1 month, percent weight loss Percent weight loss: 17 % Interventions: Tube feeding, Prostat Patient has had poor p.o. intake for months and there was consideration for cortrak placement for nutrition Within the past few days, p.o. intake has started to improve, will continue to monitor   Acute urinary retention Foley catheter placed early on in admission.  Plasmaphoresis team confirmed no need for Foley catheter for them.  Discontinued Foley catheter approximately 48 hours ago.  However as per nursing report today, since then patient has had ongoing urinary retention and had 3 in and out catheterizations every 6 hours with volume between 650-900 mL.  I was not informed of this until this morning.  Recommended placing back Foley catheter.  Etiology unsure.?  Related to profound weakness.  Discussed with neurology, less likely neurologic.  Could also be due to constipation.  Consider Flomax at CIR.  Recommend urology consultation.  Constipation Unclear when patient last had her BM.  She indicates that it has been several days.  Patient is a very poor historian.  Unclear if there is some underlying cognitive impairment issue.  Volunteers to passing flatus.  Denies abdominal pain, nausea or vomiting.  Has been tolerating diet.  Provided a dose of Dulcolax suppository today and initiated MiraLAX and Colace.  Monitor at CIR    Hypomagnesemia/hypomagnesemia: Replaced.  Replace low potassium.   Anemia: Unclear etiology.  Hemoglobin dropped from 13 range to 11 in the absence of overt bleeding.?  Related to plasmaphoresis.  Stable.  Follow CBC as outpatient.  Microscopic hematuria: May be related to recent UTI versus Foley catheter.  Recommend repeating urine microscopy in a couple of weeks and if this persist then may need further evaluation.  Addendum Thiamine Deficiency Post discharge to CIR, her thiamine levels sent from recent hospitalization came back as low at 31.  Reviewed chart.  This lab was sent on 6/18.  She did receive IV thiamine to 50 mg twice daily x3 days between June 22 - June 24.  She may need maintenance thiamine and subsequent follow-up of levels.  I sent an in basket message to CIR attending currently caring for her.  I have also forwarded the discharge summary to her PCP and the Neurologist that she will be seeing.   Body mass index is 32.46 kg/m./Obesity         Consults called:  NEURO, rehab MD  Procedures: As noted above.  Central line to be removed prior to DC.   Discharge Instructions  Discharge Instructions     Call MD for:   Complete by: As directed    Recurrent or worsening visual symptoms.   Diet - low sodium heart healthy   Complete by: As directed  Discharge instructions   Complete by: As directed    1) continue indwelling Foley catheter at discharge due to ongoing urinary retention.  Recommend urology consultation. 2) feeding supplement (BOOST / RESOURCE BREEZE) liquid 1 Container, Oral, 3 times daily between meals   Increase activity slowly   Complete by: As directed    No wound care   Complete by: As directed         Medication List     STOP taking these medications    ondansetron 4 MG disintegrating tablet Commonly known as: Zofran ODT   sucralfate 1 g tablet Commonly known as: CARAFATE       TAKE these medications    amoxicillin 500 MG  capsule Commonly known as: AMOXIL Take 2 capsules (1,000 mg total) by mouth every 12 (twelve) hours for 8 days. What changed: additional instructions   clarithromycin 500 MG tablet Commonly known as: BIAXIN Take 1 tablet (500 mg total) by mouth every 12 (twelve) hours for 14 days. What changed: additional instructions   levothyroxine 100 MCG tablet Commonly known as: SYNTHROID Take 100 mcg by mouth daily.   metoprolol succinate 25 MG 24 hr tablet Commonly known as: TOPROL-XL Take 25 mg by mouth daily.   multivitamin with minerals Tabs tablet Take 1 tablet by mouth daily. Start taking on: May 18, 2021   pantoprazole 40 MG tablet Commonly known as: PROTONIX Take 40 mg by mouth 2 (two) times daily.   polyethylene glycol 17 g packet Commonly known as: MIRALAX / GLYCOLAX Take 17 g by mouth 2 (two) times daily.   senna-docusate 8.6-50 MG tablet Commonly known as: Senokot-S Take 2 tablets by mouth daily. Start taking on: May 18, 2021       No Known Allergies    Procedures/Studies: MR BRAIN W WO CONTRAST  Result Date: 05/04/2021 CLINICAL DATA:  Weakness and blurry vision. EXAM: MRI HEAD WITH AND WITHOUT CONTRAST MRI CERVICAL AND THORACIC SPINE WITHOUT AND WITH CONTRAST TECHNIQUE: Multiplanar and multiecho pulse sequences of the head and cervical spine, to include the craniocervical junction and cervicothoracic junction, and the thoracic spine, were obtained without and with intravenous contrast. CONTRAST:  74mL GADAVIST GADOBUTROL 1 MMOL/ML IV SOLN COMPARISON:  None. FINDINGS: MRI BRAIN FINDINGS Brain: No acute infarct, mass effect or extra-axial collection. No acute or chronic hemorrhage. There is hyperintense T2-weighted signal within the medial thalami and periaqueductal gray matter. There is mild contrast enhancement of the periaqueductal gray matter but no other enhancing lesion. The midline structures are normal. Vascular: Major flow voids are preserved. Skull and upper  cervical spine: Normal calvarium and skull base. Visualized upper cervical spine and soft tissues are normal. Sinuses/Orbits:No paranasal sinus fluid levels or advanced mucosal thickening. No mastoid or middle ear effusion. Normal orbits. MRI CERVICAL SPINE FINDINGS Alignment: Physiologic. Vertebrae: No fracture, evidence of discitis, or bone lesion. Cord: Normal signal and morphology. No abnormal contrast enhancement. Posterior Fossa, vertebral arteries, paraspinal tissues: Negative. Disc levels: No spinal canal or neural foraminal stenosis. MRI THORACIC SPINE FINDINGS Alignment:  Physiologic. Vertebrae: No fracture, evidence of discitis, or bone lesion. Cord: Normal signal and morphology. No abnormal contrast enhancement Paraspinal and other soft tissues: Negative. Disc levels: T7-8: Small right subarticular disc protrusion without stenosis. T8-9: Small left subarticular disc protrusion without stenosis. The other thoracic disc levels are normal. IMPRESSION: 1. Hyperintense T2-weighted signal and mild contrast enhancement within the medial thalami and periaqueductal gray matter. Demyelinating disease remains a primary consideration. MRI of the orbits with and  without contrast might be helpful for better characterization of the optic nerves. CSF sampling should also be considered. 2. No demyelinating lesions of the cervical or thoracic spinal cord. Electronically Signed   By: Deatra Robinson M.D.   On: 05/04/2021 02:42   MR CERVICAL SPINE W WO CONTRAST  Result Date: 05/04/2021 CLINICAL DATA:  Weakness and blurry vision. EXAM: MRI HEAD WITH AND WITHOUT CONTRAST MRI CERVICAL AND THORACIC SPINE WITHOUT AND WITH CONTRAST TECHNIQUE: Multiplanar and multiecho pulse sequences of the head and cervical spine, to include the craniocervical junction and cervicothoracic junction, and the thoracic spine, were obtained without and with intravenous contrast. CONTRAST:  64mL GADAVIST GADOBUTROL 1 MMOL/ML IV SOLN COMPARISON:   None. FINDINGS: MRI BRAIN FINDINGS Brain: No acute infarct, mass effect or extra-axial collection. No acute or chronic hemorrhage. There is hyperintense T2-weighted signal within the medial thalami and periaqueductal gray matter. There is mild contrast enhancement of the periaqueductal gray matter but no other enhancing lesion. The midline structures are normal. Vascular: Major flow voids are preserved. Skull and upper cervical spine: Normal calvarium and skull base. Visualized upper cervical spine and soft tissues are normal. Sinuses/Orbits:No paranasal sinus fluid levels or advanced mucosal thickening. No mastoid or middle ear effusion. Normal orbits. MRI CERVICAL SPINE FINDINGS Alignment: Physiologic. Vertebrae: No fracture, evidence of discitis, or bone lesion. Cord: Normal signal and morphology. No abnormal contrast enhancement. Posterior Fossa, vertebral arteries, paraspinal tissues: Negative. Disc levels: No spinal canal or neural foraminal stenosis. MRI THORACIC SPINE FINDINGS Alignment:  Physiologic. Vertebrae: No fracture, evidence of discitis, or bone lesion. Cord: Normal signal and morphology. No abnormal contrast enhancement Paraspinal and other soft tissues: Negative. Disc levels: T7-8: Small right subarticular disc protrusion without stenosis. T8-9: Small left subarticular disc protrusion without stenosis. The other thoracic disc levels are normal. IMPRESSION: 1. Hyperintense T2-weighted signal and mild contrast enhancement within the medial thalami and periaqueductal gray matter. Demyelinating disease remains a primary consideration. MRI of the orbits with and without contrast might be helpful for better characterization of the optic nerves. CSF sampling should also be considered. 2. No demyelinating lesions of the cervical or thoracic spinal cord. Electronically Signed   By: Deatra Robinson M.D.   On: 05/04/2021 02:42   MR THORACIC SPINE W WO CONTRAST  Result Date: 05/04/2021 CLINICAL DATA:   Weakness and blurry vision. EXAM: MRI HEAD WITH AND WITHOUT CONTRAST MRI CERVICAL AND THORACIC SPINE WITHOUT AND WITH CONTRAST TECHNIQUE: Multiplanar and multiecho pulse sequences of the head and cervical spine, to include the craniocervical junction and cervicothoracic junction, and the thoracic spine, were obtained without and with intravenous contrast. CONTRAST:  30mL GADAVIST GADOBUTROL 1 MMOL/ML IV SOLN COMPARISON:  None. FINDINGS: MRI BRAIN FINDINGS Brain: No acute infarct, mass effect or extra-axial collection. No acute or chronic hemorrhage. There is hyperintense T2-weighted signal within the medial thalami and periaqueductal gray matter. There is mild contrast enhancement of the periaqueductal gray matter but no other enhancing lesion. The midline structures are normal. Vascular: Major flow voids are preserved. Skull and upper cervical spine: Normal calvarium and skull base. Visualized upper cervical spine and soft tissues are normal. Sinuses/Orbits:No paranasal sinus fluid levels or advanced mucosal thickening. No mastoid or middle ear effusion. Normal orbits. MRI CERVICAL SPINE FINDINGS Alignment: Physiologic. Vertebrae: No fracture, evidence of discitis, or bone lesion. Cord: Normal signal and morphology. No abnormal contrast enhancement. Posterior Fossa, vertebral arteries, paraspinal tissues: Negative. Disc levels: No spinal canal or neural foraminal stenosis. MRI THORACIC SPINE FINDINGS Alignment:  Physiologic. Vertebrae: No fracture, evidence of discitis, or bone lesion. Cord: Normal signal and morphology. No abnormal contrast enhancement Paraspinal and other soft tissues: Negative. Disc levels: T7-8: Small right subarticular disc protrusion without stenosis. T8-9: Small left subarticular disc protrusion without stenosis. The other thoracic disc levels are normal. IMPRESSION: 1. Hyperintense T2-weighted signal and mild contrast enhancement within the medial thalami and periaqueductal gray matter.  Demyelinating disease remains a primary consideration. MRI of the orbits with and without contrast might be helpful for better characterization of the optic nerves. CSF sampling should also be considered. 2. No demyelinating lesions of the cervical or thoracic spinal cord. Electronically Signed   By: Deatra Robinson M.D.   On: 05/04/2021 02:42   US RENAL  Result Date: 05/04/2021 CLINICAL DATA:  Chronic renal disease. EXAM: RENAL / URINARY TRACT ULTRASOUND COMPLETE COMPARISON:  CT abdomen and pelvis March 14, 2021. FINDINGS: Right Kidney: Renal measurements: 10.2 x 4.2 x 5.0 cm = volume: 112.0 mL. Mild renal cortical thinning. No hydronephrosis. Left Kidney: Renal measurements: May 0.4 x 4.5 x 3.9 cm = volume: 76.7 mL. Mild renal cortical thinning. No hydronephrosis. Bladder: Decompressed with Foley catheter. Other: None. IMPRESSION: No hydronephrosis. Electronically Signed   By: Annia Belt M.D.   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 Catheter needed for plasmapheresis. EXAM: FLUOROSCOPIC AND ULTRASOUND GUIDED PLACEMENT OF A NON-TUNNELED DIALYSIS CATHETER Physician: Rachelle Hora. Henn, MD MEDICATIONS: 1% lidocaine ANESTHESIA/SEDATION: None FLUOROSCOPY TIME:  Fluoroscopy Time: 12 seconds, 7 mGy COMPLICATIONS: None immediate. PROCEDURE: Informed consent was obtained for catheter placement. The patient was placed supine on the interventional table. Ultrasound confirmed a patent right internal jugular vein. Ultrasound images were obtained for documentation. The right neck was prepped and draped in a sterile fashion. The right neck was anesthetized with 1% lidocaine. Maximal barrier sterile technique was utilized including caps, mask, sterile gowns, sterile gloves, sterile drape, hand hygiene and skin antiseptic. A small incision was made with #11 blade scalpel. A 21 gauge needle directed into the right internal jugular  vein with ultrasound guidance. A micropuncture dilator set was placed. A 15 cm Trialysis was selected. The catheter was advanced over a wire and positioned at the superior cavoatrial junction. Fluoroscopic images were obtained for documentation. Both dialysis lumens were found to aspirate and flush well. The proper amount of heparin was flushed in both lumens. The central venous lumen was flushed with normal saline. Catheter was sutured to skin. FINDINGS: Catheter tip at the superior cavoatrial junction. IMPRESSION: Successful placement of a right jugular non-tunneled dialysis/pheresis catheter using ultrasound and fluoroscopic guidance. Electronically Signed   By: Richarda Overlie M.D.   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 Catheter needed for plasmapheresis.   EXAM: FLUOROSCOPIC AND ULTRASOUND GUIDED PLACEMENT OF A NON-TUNNELED DIALYSIS CATHETER   Physician: Rachelle Hora. Henn, MD   MEDICATIONS: 1% lidocaine   ANESTHESIA/SEDATION: None   FLUOROSCOPY TIME:  Fluoroscopy Time: 12 seconds, 7 mGy   COMPLICATIONS: None immediate.   PROCEDURE: Informed consent was obtained for catheter placement. The patient was placed supine on the interventional table. Ultrasound confirmed a patent right internal jugular vein. Ultrasound images were obtained for documentation. The right neck was prepped and draped in a sterile fashion. The right neck was anesthetized with 1% lidocaine. Maximal barrier sterile technique was utilized including caps, mask, sterile gowns, sterile gloves,  sterile drape, hand hygiene and skin antiseptic. A small incision was made with #11 blade scalpel. A 21 gauge needle directed into the right internal jugular vein with ultrasound guidance. A micropuncture dilator set was placed. A 15 cm Trialysis was selected. The catheter was advanced over a wire and positioned at the superior cavoatrial junction. Fluoroscopic images were obtained for  documentation. Both dialysis lumens were found to aspirate and flush well. The proper amount of heparin was flushed in both lumens. The central venous lumen was flushed with normal saline. Catheter was sutured to skin.   FINDINGS: Catheter tip at the superior cavoatrial junction.   IMPRESSION: Successful placement of a right jugular non-tunneled dialysis/pheresis catheter using ultrasound and fluoroscopic guidance.     Electronically Signed   By: Richarda Overlie M.D.   On: 05/08/2021 16:58    DG FL GUIDED LUMBAR PUNCTURE  Result Date: 05/08/2021 CLINICAL DATA:  Optic neuritis, altered mental status EXAM: DIAGNOSTIC LUMBAR PUNCTURE UNDER FLUOROSCOPIC GUIDANCE COMPARISON:  None FLUOROSCOPY TIME:  Fluoroscopy Time:  12 seconds Radiation Exposure Index (if provided by the fluoroscopic device): 1.5 mGy Number of Acquired Spot Images: 0 PROCEDURE: Informed consent was obtained from the patient prior to the procedure, including potential complications of headache, allergy, and pain. With the patient prone, the lower back was prepped with Betadine. 1% Lidocaine was used for local anesthesia. Lumbar puncture was performed at the L4-L5 level using a 20 gauge needle with return of clear CSF with an opening pressure of 17 cm water. Approximately 13 ml of CSF were obtained for laboratory studies. The patient tolerated the procedure well and there were no apparent complications. IMPRESSION: Successful lumbar puncture. Approximately 13 mL of clear CSF were obtained and sent to the laboratory for analysis. There were no immediate complications. Electronically Signed   By: Caprice Renshaw   On: 05/08/2021 09:48   MR ORBITS W WO CONTRAST  Result Date: 05/05/2021 CLINICAL DATA:  Optic neuritis suspected. EXAM: MRI OF THE ORBITS WITHOUT AND WITH CONTRAST TECHNIQUE: Multiplanar, multi-echo pulse sequences of the orbits and surrounding structures were acquired including fat saturation techniques, before and after intravenous contrast  administration. CONTRAST:  10mL GADAVIST GADOBUTROL 1 MMOL/ML IV SOLN COMPARISON:  Abnormal MRI of the brain. FINDINGS: Orbits: Globes are within normal limits bilaterally. The optic nerves are unremarkable. Chiasm is within normal limits. Abnormal signal or enhancement is present. Extraocular muscles are within normal limits. Visualized sinuses: Polyp or mucous retention cyst is noted in the left maxillary sinus. The paranasal sinuses and mastoid air cells are otherwise clear. Soft tissues: Periorbital soft tissues and visualized facial soft tissues are within normal limits. 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 There is subtle enhancement along the periaqueductal gray matter and medial thalami. IMPRESSION: 1. Normal MRI appearance of the orbits. 2. Subtle enhancement along the periaqueductal gray matter and medial thalami bilaterally. This corresponds to the abnormal signal changes on the MRI from yesterday. In addition to a demyelinating process, or Wernicke encephalopathy (thiamine deficiency encephalopathy) is also considered. Electronically Signed   By: Marin Roberts M.D.   On: 05/05/2021 13:25      Subjective: Patient seen in the hemodialysis unit while she was undergoing plasmapheresis.  No new complaints reported.  Vision same as yesterday and as noted in neurology follow-up notes.  Denies abdominal pain, nausea and vomiting.  Passing flatus.  No BM for several days.  To me she reported normal vision in right eye and slight  blurring on the left eye.  Discharge Exam:  Vitals:   05/17/21 1005 05/17/21 1007 05/17/21 1051 05/17/21 1159  BP: (!) 97/58  102/69 107/67  Pulse: (!) 105 (!) 101 (!) 102 96  Resp: (!) 24 (!) 25 (!) 23 17  Temp:   99.2 F (37.3 C)   TempSrc:   Oral Oral  SpO2: 98% 98% 98% 100%  Weight:      Height:        General exam: Pleasant young female, moderately built and obese lying comfortably propped up in bed without  distress.  Has eyeglasses on with left side taped.  Ongoing plasmaphoresis. Respiratory system: Clear to auscultation.  No increased work of breathing. Cardiovascular system: S1 and S2 heard, RRR.  No JVD, murmurs or pedal edema.   Gastrointestinal system: Abdomen is nondistended, soft and nontender.  No organomegaly or masses felt. Normal bowel sounds heard. Central nervous system: Alert and oriented.  Mild disconjugate gaze but otherwise no cranial nerve deficits.  Stable without change. Extremities: No C/C/E, +pedal pulses.  Symmetric 5 x 5 power. Skin: No rashes, lesions or ulcers Psychiatry: Judgement and insight appear impaired. Mood & affect flat    The results of significant diagnostics from this hospitalization (including imaging, microbiology, ancillary and laboratory) are listed below for reference.     Microbiology: Recent Results (from the past 240 hour(s))  Culture, Urine     Status: Abnormal   Collection Time: 05/07/21 11:31 PM   Specimen: Urine, Random  Result Value Ref Range Status   Specimen Description URINE, RANDOM  Final   Special Requests   Final    NONE Performed at [facility], [address] [place], [place] [address]    Culture (A)  Final    >=100,000 COLONIES/mL ESCHERICHIA COLI Confirmed Extended Spectrum Beta-Lactamase Producer (ESBL).  In bloodstream infections from ESBL organisms, carbapenems are preferred over piperacillin/tazobactam. They are shown to have a lower risk of mortality.    Report Status 05/10/2021 FINAL  Final   Organism ID, Bacteria ESCHERICHIA COLI (A)  Final      Susceptibility   Escherichia coli - MIC*    AMPICILLIN >=32 RESISTANT Resistant     CEFAZOLIN >=64 RESISTANT Resistant     CEFEPIME 2 SENSITIVE Sensitive     CEFTRIAXONE >=64 RESISTANT Resistant     CIPROFLOXACIN 0.5 SENSITIVE Sensitive     GENTAMICIN >=16 RESISTANT Resistant     IMIPENEM <=0.25 SENSITIVE Sensitive     NITROFURANTOIN <=16 SENSITIVE Sensitive      TRIMETH/SULFA >=320 RESISTANT Resistant     AMPICILLIN/SULBACTAM >=32 RESISTANT Resistant     PIP/TAZO <=4 SENSITIVE Sensitive     * >=100,000 COLONIES/mL ESCHERICHIA COLI  CSF culture w Gram Stain     Status: None   Collection Time: 05/08/21  9:21 AM   Specimen: PATH Cytology CSF; Cerebrospinal Fluid  Result Value Ref Range Status   Specimen Description CSF  Final   Special Requests NONE  Final   Gram Stain CYTOSPIN SMEAR NO WBC SEEN NO ORGANISMS SEEN   Final   Culture   Final    NO GROWTH 3 DAYS Performed at [facility], [address] [place], [place] [address]    Report Status 05/11/2021 FINAL  Final  Fungus Culture With Stain     Status: None (Preliminary result)   Collection Time: 05/08/21  9:21 AM   Specimen: PATH Cytology CSF; Cerebrospinal Fluid  Result Value Ref Range Status  Fungus Stain Final report  Final    Comment: (NOTE) Performed At: [facility] [address] [place], [place] [id number] Jolene Schimke MD WU:[id number]    Fungus (Mycology) Culture PENDING  Incomplete   Fungal Source CSF  Final    Comment: Performed at [facility], [address], [place], [place] [address]  Anaerobic culture w Gram Stain     Status: None   Collection Time: 05/08/21  9:21 AM   Specimen: PATH Cytology CSF; Cerebrospinal Fluid  Result Value Ref Range Status   Specimen Description CSF  Final   Special Requests NONE  Final   Gram Stain   Final    WBC PRESENT, PREDOMINANTLY MONONUCLEAR NO ORGANISMS SEEN CYTOSPIN SMEAR    Culture   Final    NO ANAEROBES ISOLATED Performed at [facility], [address] [place], [place] [address]    Report Status 05/13/2021 FINAL  Final  Fungus Culture Result     Status: None   Collection Time: 05/08/21  9:21 AM  Result Value Ref Range Status   Result 1 Comment  Final    Comment: (NOTE) KOH/Calcofluor preparation:  no fungus observed. Performed At: [facility] [address] [place], [place]  [id number] Jolene Schimke MD MW:[id number]      Labs: CBC: Recent Labs  Lab 05/11/21 0437 05/13/21 0650 05/14/21 1539 05/15/21 0659 05/16/21 0500 05/17/21 0430  WBC 15.2* 11.1* 14.3* 10.7* 8.9 11.6*  NEUTROABS 13.3* 8.3*  --   --   --   --   HGB 15.2* 13.3 13.1 11.3* 11.2* 11.5*  HCT 45.7 41.5 40.2 35.0* 35.4* 35.9*  MCV 85.3 87.6 88.5 89.1 90.3 90.2  PLT 205 212 203 170 190 186    Basic Metabolic Panel: Recent Labs  Lab 05/13/21 0650 05/14/21 1539 05/15/21 0659 05/16/21 0500 05/17/21 0430  NA 142 143 144 143 143  K 3.2* 2.9* 3.8 3.4* 4.0  CL 109 115* 117* 112* 114*  CO2 GLUCOSE 85 130* 94 94 100*  BUN CREATININE 1.01* 0.88 0.81 0.76 0.87  CALCIUM 8.8* 8.2* 8.4* 8.4* 8.7*  MG  --   --  1.6* 2.1  --     Liver Function Tests: Recent Labs  Lab 05/13/21 0650 05/15/21 0659  AST 24 27  ALT 40 27  ALKPHOS 36* 35*  BILITOT 1.4* 0.5  PROT 5.0* 4.1*  ALBUMIN 3.4* 2.8*     Urinalysis    Component Value Date/Time   COLORURINE AMBER (A) 05/13/2021 1012   APPEARANCEUR TURBID (A) 05/13/2021 1012   LABSPEC 1.013 05/13/2021 1012   PHURINE 6.0 05/13/2021 1012   GLUCOSEU NEGATIVE 05/13/2021 1012   HGBUR LARGE (A) 05/13/2021 1012   BILIRUBINUR NEGATIVE 05/13/2021 1012   KETONESUR NEGATIVE 05/13/2021 1012   PROTEINUR 100 (A) 05/13/2021 1012   NITRITE NEGATIVE 05/13/2021 1012   LEUKOCYTESUR MODERATE (A) 05/13/2021 1012    I discussed in detail with patient's sister via phone, updated care and answered all questions.  Time coordinating discharge: 35 minutes  SIGNED:  Marcellus Scott, MD, FACP, [facility]. [facility]  To contact the attending provider between 7A-7P or the covering provider during after hours 7P-7A, please log into the web site [email or web address] and access using universal [place] password for that web site. If you do not have the password, please call the hospital operator.

## 2021-05-17 NOTE — TOC Transition Note (Signed)
Transition of Care [facility]) - CM/SW Discharge Note   Patient Details  Name: Diane Todd MRN: [id number] Date of Birth: Aug 14, 2001  [facility] CM/SW Contact:  Kermit Balo, RN Phone Number: 05/17/2021, 3:46 PM   Clinical Narrative:    Patient is discharging to CIR today. CM signing off.   Final next level of care: IP Rehab Facility Barriers to Discharge: No Barriers Identified   Patient Goals and CMS Choice        Discharge Placement                       Discharge Plan and Services                                     Social Determinants of Health (SDOH) Interventions     Readmission Risk Interventions No flowsheet data found.

## 2021-05-17 NOTE — Progress Notes (Signed)
Neurology Progress Note  Brief HPI: Diane Todd is a 20 y.o. female with PMH significant for recent Hpylori infection, intractable nausea with poor po intake since April 2022, who initially presented to OSH on 6/16 with 2.5 weeks hx of blurred vision which progressed to ataxia. 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 MRI Brain and C spine with and w/o contrast with hyperintense T2-weighted signal and mild contrast enhancement within the medial thalami and  periaqueductal gray matter. MRI Orbits with no significant abnormality. 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 There was no significant improvement with steroids so she was started on PLEX- to be complete 05/17/2021.  Subjective: No acute overnight events Progressing on PT/OT goals per chart review Endorses some improvement in blurry vision at short distance, endorses continued blurry vision and intermittent diplopia when trying to focus on distant objects PLEX treatment 5/5 to be completed today AQP4-Ab negative  Exam: Vitals:   05/17/21 0004 05/17/21 0300  BP: 102/63 134/70  Pulse: 94 83  Resp: 18 17  Temp: 99.2 F (37.3 C) 99.2 F (37.3 C)  SpO2: 100% 98%   Gen: Laying comfortably in bed, in no acute distress Resp: non-labored breathing, no respiratory distress, on room air Abd: soft, non-distended, non-tender  Neuro: Mental Status: Awake and alert on assessment.  She is oriented to person, age, month, year, and place.  Speech remains slowed and soft but is without dysarthria or aphasia. Follows commands.  No neglect is noted.  Cranial Nerves: PERRL 4 mm / brisk, no visual field deficits. Eyes remain dysconjugate with ophthalmoplegia that is somewhat improved - there is horizontal gaze palsy b/l but today the right eye abduction and adduction are much improved from last 2 days - although is  still not able to dig her eyes sideways to either medial or lateral canthi b/l. Left eye has worse adduction limitation than compared to right. Still looks like a b/l INO that is somewhat improved from before.. Face is symmetric resting and smiling, hearing is intact to voice, tongue protrudes midline.  Motor: 4+/5 bilateral upper extremity strength throughout upper and lower extremities.  There is no vertical drift throughout upper or lower extremities. Tone and bulk are normal Sensory: Sensation to light touch intact and symmetric in upper and lower extremities Gait: Deferred  Pertinent Labs: CBC    Component Value Date/Time   WBC 11.6 (H) 05/17/2021 0430   RBC 3.98 05/17/2021 0430   HGB 11.5 (L) 05/17/2021 0430   HCT 35.9 (L) 05/17/2021 0430   PLT 186 05/17/2021 0430   MCV 90.2 05/17/2021 0430   MCH 28.9 05/17/2021 0430   MCHC 32.0 05/17/2021 0430   RDW 21.1 (H) 05/17/2021 0430   LYMPHSABS 1.6 05/13/2021 0650   MONOABS 0.8 05/13/2021 0650   EOSABS 0.1 05/13/2021 0650   BASOSABS 0.0 05/13/2021 0650   CMP     Component Value Date/Time   NA 143 05/17/2021 0430   K 4.0 05/17/2021 0430   CL 114 (H) 05/17/2021 0430   CO2 23 05/17/2021 0430   GLUCOSE 100 (H) 05/17/2021 0430   BUN 9 05/17/2021 0430   CREATININE 0.87 05/17/2021 0430   CALCIUM 8.7 (L) 05/17/2021 0430   PROT 4.1 (L) 05/15/2021 0659   ALBUMIN 2.8 (L) 05/15/2021 0659   ALBUMIN 3.8 (L) 05/09/2021 0830   AST 27 05/15/2021 0659   ALT 27 05/15/2021  0659   ALKPHOS 35 (L) 05/15/2021 0659   BILITOT 0.5 05/15/2021 0659   GFRNONAA >60 05/17/2021 0430   Urinalysis    Component Value Date/Time   COLORURINE AMBER (A) 05/13/2021 1012   APPEARANCEUR TURBID (A) 05/13/2021 1012   LABSPEC 1.013 05/13/2021 1012   PHURINE 6.0 05/13/2021 1012   GLUCOSEU NEGATIVE 05/13/2021 1012   HGBUR LARGE (A) 05/13/2021 1012   BILIRUBINUR NEGATIVE 05/13/2021 1012   KETONESUR NEGATIVE 05/13/2021 1012   PROTEINUR 100 (A) 05/13/2021 1012    NITRITE NEGATIVE 05/13/2021 1012   LEUKOCYTESUR MODERATE (A) 05/13/2021 1012   CSF studies: Glucose: 80 Protein: 45 WBC: 8 OCB: 0 IgG index: 2.0  MOG Ab is negative. Culture NO GROWTH 3 DAYS       Ref. Range 05/09/2021 08:30  Albumin CSF-mCnc Latest Ref Range: 7 - 29 mg/dL 24  IgG, CSF Latest Ref Range: 0.0 - 6.7 mg/dL 3.9  IgG/Alb Ratio, CSF Latest Ref Range: 0.00 - 0.25 0.16  CSF IgG Index Latest Ref Range: 0.0 - 0.7 2.0 (H)    Results for Diane Todd (MRN [id number]) as of 05/17/2021 07:43  Ref. Range 05/09/2021 19:53  NMO-IgG Latest Ref Range: 0.0 - 3.0 U/mL <1.5  Results for Diane Todd (MRN [id number]) as of 05/17/2021 07:43  Ref. Range 05/05/2021 19:47  Anti-Myelin Assoc Glycop IgG Latest Units: titer <1:10   Thiamine level 05/05/2021: 38.3   Imaging Reviewed: MRI Orbits with and without contrast: 1. Normal MRI appearance of the orbits. 2. Subtle enhancement along the periaqueductal gray matter and medial thalami bilaterally. This corresponds to the abnormal signal changes on the MRI from yesterday. In addition to a demyelinating process, or Wernicke encephalopathy (thiamine deficiency encephalopathy) is also considered.   MRI Brain with and without cotnrast(personally reviewed): 1. Hyperintense T2-weighted signal and mild contrast enhancement within the medial thalami and periaqueductal gray matter. Demyelinating disease remains a primary consideration. MRI of the orbits with and without contrast might be helpful for better characterization of the optic nerves. CSF sampling should also be considered. 2. No demyelinating lesions of the cervical or thoracic spinal cord.   MRI C and T spine with and without contrast: No demyelinating lesions of the cervical or thoracic spinal cord.  Assessment: Diane Todd is a 20 y.o. female with PMH significant for recent Hpylori infection, intractable nausea with poor po intake since April 2022, who initially presented  to OSH on 6/16 with 2.5 weeks hx of blurred vision which progressed to ataxia. 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 She endorses generalized weakness from deconditioning also with a component of malnutrition with constant nausea and vomiting preceding hospitalization.  - Involvement of the medial thalami might explain decreased arousal and involvement of the periaqueductal gray might explain her nausea. - Initially treated with IV solumedrol without significant improvement. PLEX initiated 05/09/2021 to be completed 05/17/2021.  - Thiamine level low at 38.3, CSF IgG index elevated at 2.0, OCB negative.  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  Recommendations: - Completed 5/5 days of IV solumedrol without significant improvement - PLEX, session 5/5 to be completed today; final round - Thiamine level low at 38.3, continue supplementation  Follow up outpatient with Dr. Epimenio Foot Plan d/w Dr. Gaye Alken, AGACNP-BC [facility] [phone]  Attending Neurohospitalist Addendum Patient seen and examined with APP/Resident. Agree with the history and physical as documented above. Agree with the plan as documented, which I helped formulate. I have independently reviewed the chart, obtained history, review of systems and examined the patient.I have personally reviewed pertinent head/neck/spine imaging (CT/MRI). Please feel free to call with any questions.  -- Milon Dikes, MD Neurologist [facility] Pager: [phone]

## 2021-05-17 NOTE — Progress Notes (Signed)
Nutrition Follow-up  DOCUMENTATION CODES:   Severe malnutrition in context of acute illness/injury  INTERVENTION:   - Continue to encourage PO intake; family can bring in food from home/restaurants  - Continue Boost Breeze po TID, each supplement provides 250 kcal and 9 grams of protein  - Continue Magic Cup TID with meals, each supplement provides 290 kcal and 9 grams of protein  - Continue MVI with minerals daily  NUTRITION DIAGNOSIS:   Severe Malnutrition related to acute illness as evidenced by energy intake < or equal to 50% for > or equal to 1 month, percent weight loss.  Ongoing, being addressed via oral nutrition supplements  GOAL:   Patient will meet greater than or equal to 90% of their needs  Progressing  MONITOR:   PO intake, Supplement acceptance, Labs, Weight trends, Skin, I & O's  REASON FOR ASSESSMENT:   Malnutrition Screening Tool    ASSESSMENT:   20 y.o. female with history of hypertension, hypothyroidism and H. pylori gastritis who presents as transfer from [facility] with concern for neuromyelitis optica spectrum disorder.  6/24 - clear liquids 6/25 - dysphagia 1 with thin liquids 6/27 - dysphagia 3 with thin liquids  Per notes, pt receiving 5 total sessions of plasmapheresis with last session scheduled for today. Noted possible discharge to CIR later today.  Noted pt has not had a BM since admission. Pt with colace scheduled BID. Discussed with MD and RN. MD has increased pt's bowel regimen to include miralax, senna, and dulcolax suppository today. MD has discontinued colace.  Pt in HD receiving PLEX treatment at time of RD visit. RD was able to speak with RN who reports PO intake is improving and pt consuming ~50% of most meals. Per notes, pt's family is bringing in food from restaurants and pt is eating this. RN has not witnessed this but has seen fast food cups in the car. Per RN, pt enjoys Boost Breeze supplements so will continue these as  well as [employer]. Continue to encourage PO intake.  Admit weight: 98.6 kg 6/30 weight: 94 kg  Weight trending down; will continue to monitor.  Meal Completion: 10-100%  Medications reviewed and include: calcium carbonate, colace, Boost Breeze TID, MVI with minerals daily, protonix, IV albumin, IV calcium gluconate  Labs reviewed.  UOP: 1350 ml x 24 hours I/O's: -5.2 L since admit  NUTRITION - FOCUSED PHYSICAL EXAM:  Unable to complete; pt in HD receiving PLEX treatment.  Diet Order:   Diet Order             DIET DYS 3 Room service appropriate? Yes; Fluid consistency: Thin  Diet effective now                   EDUCATION NEEDS:   Education needs have been addressed  Skin:  Skin Assessment: Reviewed RN Assessment (ecchymosis)  Last BM:  no documented BM  Height:   Ht Readings from Last 1 Encounters:  05/04/21 5\' 7"  (1.702 m)    Weight:   Wt Readings from Last 1 Encounters:  05/17/21 96 kg    Ideal Body Weight:  61.36 kg  BMI:  Body mass index is 33.15 kg/m.  Estimated Nutritional Needs:   Kcal:  2200-2500kcal/day  Protein:  110-125g/day  Fluid:  1.9-2.2L/day    07/18/21, MS, RD, LDN Inpatient Clinical Dietitian Please see AMiON for contact information.

## 2021-05-17 NOTE — Progress Notes (Signed)
Patient arrived to floor, denies any distress/pain.  Denies any needs at this time.  VSS.  Bed in lowest position, call light in reach and sister at bedside. Diane Todd

## 2021-05-17 NOTE — Discharge Instructions (Signed)

## 2021-05-17 NOTE — H&P (Signed)
Physical Medicine and Rehabilitation Admission H&P       HPI: Diane Todd is a 20 year old right-handed female with history of hypertension, tobacco use, CKD stage III,  as well as thyroid disorder.  Per chart review patient lives with parent.  Independent prior to admission.  1 level home 5 steps to entry.  Prior to 4/22 was working at [employer] going to [employer]  Presented 05/03/2021 with generalized weakness poor appetite/nausea and vomiting as well as blurred vision x2 weeks.  Admission chemistries BUN 22 creatinine 1.65, sedimentation rate of 1, hemoglobin 18.8, RBC 6.71, TSH 5.224.  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  MRI of the brain showed hyperintense T2 weighted signal and mild contrast-enhancement within the medial thalami and periaqueductal gray matter.  Demyelinating disease remained a primary consideration.  MRI of the orbits with and without contrast were considered.  CSF sampling also considered.  No demyelinating lesion of the cervical thoracic cord noted.  MRI of the orbits with and without contrast showed normal appearance of the orbits.  MRI cervical thoracic spine showed T7-8 small right subarticular disc protrusion without stenosis.  T8-9 small left subarticular disc protrusion without stenosis.  Renal ultrasound showed no hydronephrosis.  Neurology follow-up work-up for NMO recommended serum for AQ P4-IgG antibody pending, MOG-IgG antibody negative.  Lumbar puncture completed 05/08/2021-unremarkable no growth.  She did complete a 5-day course of high-dose steroids and 3 days of high-dose thiamine.  Started on plasmapheresis with plans of 5 total sessions last session 05/17/2021.  Ophthalmology was consulted Dr. Luciana Axe with a funduscopic exam completed that showed papillary retinal edema appeared concerning for elevated ICP and they recommended no further intervention at this time  given minimal fundus findings they did recommend a follow-up neuro-ophthalmology at a tertiary center.  Patient with history of H. pylori treatment initiated 05/10/2021 with amoxicillin/Biaxin twice daily, PPI twice daily for 14 days total.  A follow-up urine study 05/07/2021 positive for ESBL E. coli discussed with infectious disease pharmacy initially on meropenem to 1 dose of fosfomycin and completed treatment and remains on contact precautions.  Patient has had bouts of urinary retention requiring in and out catheterization with Foley catheter tube recently removed.  Therapy evaluations completed due to patient decreased functional mobility was admitted for a comprehensive rehab program.   Review of Systems Constitutional:  Positive for malaise/fatigue. Negative for fever.       Decreased appetite  HENT:  Negative for hearing loss.   Eyes:  Positive for blurred vision. Respiratory:  Negative for cough and shortness of breath.   Cardiovascular:  Negative for chest pain, palpitations and leg swelling. Gastrointestinal:  Positive for nausea and vomiting. Genitourinary:  Negative for dysuria, flank pain and hematuria. Musculoskeletal:  Positive for joint pain and myalgias. Skin:  Negative for rash. Neurological:  Positive for weakness and headaches. Negative for seizures.  All other systems reviewed and are negative.     Past Medical History:  Diagnosis Date   Hypertension     Hypothyroidism           Past Surgical History:  Procedure Laterality Date   IR FLUORO GUIDE CV LINE RIGHT   05/08/2021   IR US GUIDE VASC ACCESS RIGHT   05/08/2021    History reviewed. No pertinent family history. Social History:  reports that she has been smoking e-cigarettes. She has never used smokeless tobacco. She reports previous alcohol use. She reports  previous drug use. Allergies: No Known Allergies       Medications Prior to Admission  Medication Sig Dispense Refill   [EXPIRED] amoxicillin (AMOXIL) 500  MG capsule Take 1,000 mg by mouth every 12 (twelve) hours. Take for 14 days. (Patient not taking: Reported on 05/03/2021)       [EXPIRED] clarithromycin (BIAXIN) 500 MG tablet Take 500 mg by mouth every 12 (twelve) hours. Take for 14 days. (Patient not taking: Reported on 05/03/2021)       levothyroxine (SYNTHROID) 100 MCG tablet Take 100 mcg by mouth daily. (Patient not taking: Reported on 05/03/2021)       metoprolol succinate (TOPROL-XL) 25 MG 24 hr tablet Take 25 mg by mouth daily. (Patient not taking: Reported on 05/03/2021)       ondansetron (ZOFRAN ODT) 4 MG disintegrating tablet Take 1 tablet (4 mg total) by mouth every 8 (eight) hours as needed for nausea or vomiting. (Patient not taking: Reported on 05/03/2021) 10 tablet 0   pantoprazole (PROTONIX) 40 MG tablet Take 40 mg by mouth 2 (two) times daily. (Patient not taking: Reported on 05/03/2021)       sucralfate (CARAFATE) 1 g tablet Take 1 g by mouth every 6 (six) hours. Take for 14 days. (Patient not taking: Reported on 05/03/2021)          Drug Regimen Review Drug regimen was reviewed and remains appropriate with no significant issues identified   Home: Home Living Family/patient expects to be discharged to:: Private residence Living Arrangements: Parent, Other relatives (sister) Available Help at Discharge: Family, Available 24 hours/day Type of Home: House Home Access: Stairs to enter [employer] of Steps: 5 Home Layout: One level Bathroom Shower/Tub: Tub/shower unit Home Equipment: Wheelchair - manual, Shower seat Additional Comments: pt's older sister was present on eval and answered many questions as pt was very lethargic.   Functional History: Prior Function Level of Independence: Independent Comments: prior to 4/22 pt was working at [employer] and going to [employer] Since 4/22 pt has been mostly in bed and getting weaker and weaker, family has been helping with ambulation and ADL's   Functional Status:  Mobility: Bed  Mobility Overal bed mobility: Needs Assistance Bed Mobility: Rolling, Sidelying to Sit, Sit to Sidelying Rolling: Min assist Sidelying to sit: Min assist, Mod assist Supine to sit: Mod assist Sit to supine: Max assist Sit to sidelying: Min assist General bed mobility comments: required minA to roll and bring LEs off bed then trunk elevation. Increased time to perform due to fatigue Transfers Overall transfer level: Needs assistance Equipment used: Rolling walker (2 wheeled) Transfer via Lift Equipment: Stedy Transfers: Sit to/from [employer] to Stand: Min assist, +2 physical assistance Stand pivot transfers: Min assist, +2 physical assistance  Lateral/Scoot Transfers: Max assist General transfer comment: minA+2 to rise into stand. Cues for hand placement. Increased time to performa and max encouragement due to fear of falling Ambulation/Gait Ambulation/Gait assistance: Min assist, +2 physical assistance, +2 safety/equipment Gait Distance (Feet): 10 Feet (x5', x5') Assistive device: Rolling walker (2 wheeled) Gait Pattern/deviations: Step-through pattern, Decreased stride length, Decreased weight shift to right, Decreased weight shift to left, Shuffle, Trunk flexed, Wide base of support General Gait Details: Ambulated fwd/bwd 10' with RW requiring minA+2 for balance and RW management. Short bouts 2 x 5' with minA+2 for similar set up. Prolonged rest break required between each bout but patient motivated at progress made Gait velocity: decreased   ADL: ADL Overall ADL's : Needs assistance/impaired Eating/Feeding:  Set up, Bed level Grooming: Wash/dry face, Wash/dry hands, Minimal assistance, Sitting, Moderate assistance Grooming Details (indicate cue type and reason): Min-Mod A for balance while sitting on sara stedy at sink. Cues for sequencing washing her face and hands. Upper Body Bathing: Total assistance, Bed level Lower Body Bathing: Total assistance Upper Body Dressing : Maximal  assistance Lower Body Dressing: Total assistance Toilet Transfer: +2 for physical assistance, +2 for safety/equipment, BSC, Maximal assistance Toilet Transfer Details (indicate cue type and reason): staff to use lift equip Toileting- Clothing Manipulation and Hygiene: Moderate assistance, +2 for physical assistance, Sit to/from stand Toileting - Clothing Manipulation Details (indicate cue type and reason): Mod A for posterior peri care. Min-Mod A for balance in standing. Pt particiapting in performing both naterioer and posterior peri care Functional mobility during ADLs: Maximal assistance, Minimal assistance, +2 for physical assistance (sit<>stand with stedy) General ADL Comments: Pt performing sit<>stand with stedy, grooming tasks at sink, and washing up peri area at sink.   Cognition: Cognition Overall Cognitive Status: Impaired/Different from baseline Orientation Level: Oriented X4 Cognition Arousal/Alertness: Awake/alert Behavior During Therapy: Flat affect Overall Cognitive Status: Impaired/Different from baseline Area of Impairment: Safety/judgement, Following commands, Attention, Memory, Problem solving Orientation Level: Time Current Attention Level: Selective Memory: Decreased short-term memory Following Commands: Follows one step commands consistently, Follows one step commands with increased time Safety/Judgement: Decreased awareness of safety, Decreased awareness of deficits Awareness: Emergent Problem Solving: Slow processing, Requires verbal cues, Requires tactile cues General Comments: requiring increased time to process commands. Improved awareness of deficits and need for assistance. STM deficits still prevalent with patient having difficulty remembering what she ordered for lunch Difficult to assess due to: Level of arousal   Physical Exam: Blood pressure 134/70, pulse 83, temperature 99.2 F (37.3 C), temperature source Axillary, resp. rate 17, height 5\' 7"  (1.702  m), weight 94 kg, SpO2 98 %. Physical Exam Constitutional:      Appearance: She is obese. HENT:    Head: Normocephalic and atraumatic.    Right Ear: External ear normal.    Left Ear: External ear normal.    Nose: Nose normal. Eyes:    Conjunctiva/sclera: Conjunctivae normal.    Pupils: Pupils are equal, round, and reactive to light. Cardiovascular:    Rate and Rhythm: Normal rate and regular rhythm.    Heart sounds: No murmur heard.   No gallop. Pulmonary:    Effort: Pulmonary effort is normal. No respiratory distress.    Breath sounds: No wheezing. Abdominal:    General: Bowel sounds are normal. There is no distension.    Tenderness: There is no abdominal tenderness. Musculoskeletal:        General: Swelling (LUE) present.    Cervical back: Normal range of motion. Skin:    General: Skin is warm. Neurological:    Mental Status: She is alert.    Comments: Patient is alert sitting up in bed.  Oriented to person and place.  Follows simple commands.  She does have complaints of blurred vision left greater than right. Left lens of glasses taped. C/o diplopia during visual exam but gaze not grossly dysconjugate. Fair insight and awareness. Functional memory. Slightly delayed. Speech ?dysarthric. Motor 4- to 4+/5 UE and 3-4/5 prox to distal in LE's. DTR's 3+. Sensory 1+/2 all 4 limbs, senses pain and light touch.   Psychiatric:    Comments: Pt pleasant and cooperative      Lab Results Last 48 Hours        Results for orders  placed or performed during the hospital encounter of 05/03/21 (from the past 48 hour(s))  CBC     Status: Abnormal    Collection Time: 05/16/21  5:00 AM  Result Value Ref Range    WBC 8.9 4.0 - 10.5 K/uL    RBC 3.92 3.87 - 5.11 MIL/uL    Hemoglobin 11.2 (L) 12.0 - 15.0 g/dL    HCT 95.635.4 (L) 21.336.0 - 46.0 %    MCV 90.3 80.0 - 100.0 fL    MCH 28.6 26.0 - 34.0 pg    MCHC 31.6 30.0 - 36.0 g/dL    RDW 08.620.6 (H) 57.811.5 - 15.5 %    Platelets 190 150 - 400 K/uL    nRBC  0.0 0.0 - 0.2 %      Comment: Performed at [facility], [address] [place], [place] [address]  Basic metabolic panel     Status: Abnormal    Collection Time: 05/16/21  5:00 AM  Result Value Ref Range    Sodium 143 135 - 145 mmol/L    Potassium 3.4 (L) 3.5 - 5.1 mmol/L    Chloride 112 (H) 98 - 111 mmol/L    CO2 25 22 - 32 mmol/L    Glucose, Bld 94 70 - 99 mg/dL      Comment: Glucose reference range applies only to samples taken after fasting for at least 8 hours.    BUN 6 6 - 20 mg/dL    Creatinine, Ser 9.520.76 0.44 - 1.00 mg/dL    Calcium 8.4 (L) 8.9 - 10.3 mg/dL    GFR, Estimated >84>60 >13>60 mL/min      Comment: (NOTE) Calculated using the CKD-EPI Creatinine Equation (2021)      Anion gap 6 5 - 15      Comment: Performed at [facility], [address] [place], [place] [address]  Magnesium     Status: None    Collection Time: 05/16/21  5:00 AM  Result Value Ref Range    Magnesium 2.1 1.7 - 2.4 mg/dL      Comment: Performed at [facility], [address] [place], [place] [address]  Basic metabolic panel     Status: Abnormal    Collection Time: 05/17/21  4:30 AM  Result Value Ref Range    Sodium 143 135 - 145 mmol/L    Potassium 4.0 3.5 - 5.1 mmol/L    Chloride 114 (H) 98 - 111 mmol/L    CO2 23 22 - 32 mmol/L    Glucose, Bld 100 (H) 70 - 99 mg/dL      Comment: Glucose reference range applies only to samples taken after fasting for at least 8 hours.    BUN 9 6 - 20 mg/dL    Creatinine, Ser 3.660.87 0.44 - 1.00 mg/dL    Calcium 8.7 (L) 8.9 - 10.3 mg/dL    GFR, Estimated >44>60 >03>60 mL/min      Comment: (NOTE) Calculated using the CKD-EPI Creatinine Equation (2021)      Anion gap 6 5 - 15      Comment: Performed at [facility], [address] [place], [place] [address]  CBC     Status: Abnormal    Collection Time: 05/17/21  4:30 AM  Result Value Ref Range    WBC 11.6 (H) 4.0 - 10.5 K/uL    RBC 3.98 3.87 - 5.11 MIL/uL    Hemoglobin 11.5 (L) 12.0 - 15.0  g/dL  HCT 35.9 (L) 36.0 - 46.0 %    MCV 90.2 80.0 - 100.0 fL    MCH 28.9 26.0 - 34.0 pg    MCHC 32.0 30.0 - 36.0 g/dL    RDW 69.7 (H) 94.8 - 15.5 %    Platelets 186 150 - 400 K/uL    nRBC 0.0 0.0 - 0.2 %      Comment: Performed at [facility], [address], [place], [place] [address]      Imaging Results (Last 48 hours)  No results found.           Medical Problem List and Plan: 1.  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  Patient received 5 days IV Solu-Medrol/5 treatments and plasmapheresis             -patient may shower             -ELOS/Goals: 14 days, supervision to min assist with PT and OT 2.  Antithrombotics: -DVT/anticoagulation: SCDs             -antiplatelet therapy: N/A 3. Pain Management: Tylenol as needed 4. Mood: Provide emotional support             -antipsychotic agents: N/A 5. Neuropsych: This patient is capable of making decisions on her own behalf. 6. Skin/Wound Care: Routine skin checks 7. Fluids/Electrolytes/Nutrition: Routine in and outs with follow-up chemistries 8.  Hypertension.  Toprol-XL 25 mg daily.  Monitor with increased mobility 9.  Hypothyroidism.  Continue Synthroid. 10.  H. pylori.  Treatment initiated 6/24.  Amoxicillin 1 g, Biaxin 500 mg twice daily, PPI twice daily x14 days total 11.  UTI/ESBL/E. Coli/urinary retention.  Contact precautions.  Patient completed course of fosfomycin.  Foley replaced today  -recheck urine culture  -begin trial of flomax, urecholine  -consider another voiding trial this weekend 12.  Obesity.  BMI 31.28.  Dietary follow-up 13.  CKD stage III.  Follow-up chemistries 14.  Tobacco abuse.  Counseling     Mcarthur Rossetti Angiulli, PA-C 05/17/2021   I have personally performed a face to face diagnostic evaluation of this patient and formulated the key components of the plan.  Additionally, I have personally reviewed laboratory data, imaging studies, as well as relevant notes and concur  with the physician assistant's documentation above.  The patient's status has not changed from the original H&P.  Any changes in documentation from the acute care chart have been noted above.  Ranelle Oyster, MD, [place]

## 2021-05-17 NOTE — Progress Notes (Signed)
Inpatient Rehabilitation  Patient information reviewed and entered into eRehab system by Rainy Rothman M. Horatio Bertz, M.A., CCC/SLP, PPS Coordinator.  Information including medical coding, functional ability and quality indicators will be reviewed and updated through discharge.    

## 2021-05-17 NOTE — PMR Pre-admission (Signed)
PMR Admission Coordinator Pre-Admission Assessment  Patient: Diane Todd is an 20 y.o., female MRN: [id number] DOB: [birth year] Height: 5\' 7"  (170.2 cm) Weight: 96 kg              Insurance Information HMO:     PPO:      PCP:      IPA:      80/20:      OTHER:  PRIMARY: [employer]      Policy#    Subscriber: pt CM Name: vis fax approval      Phone#: [phone]     Fax#: [phone] Pre-Cert#: [id number] approved for 7 days      Employer:  Benefits:  Phone #: [phone]     Name: 6/29 Eff. Date: active     Deduct:       Out of Pocket Max:         CIR: per medicaid guidelines      SNF: per medicaid Outpatient: per medicaid     Co-Pay:  Home Health: per medicaid      Co-Pay:  DME: per medicaid     Co-Pay:  Providers: in network  SECONDARY: none      Policy#:       Phone#:   7/29:       Phone#:   [employer]" for patients in Inpatient Rehabilitation Facilities with attached "Privacy Act Statement-Health Care Records" was provided and verbally reviewed with: N/A  Emergency Contact Information Contact Information     Name Relation Home Work Mobile   olguin,isis Sister   [phone]      Current Medical History  Patient Admitting Diagnosis: neuromyelitis optica  History of present illness: Diane Todd [phone] is a 20 year old right-handed female with history of hypertension, tobacco use, CKD stage III,  as well as thyroid disorder.  Prior to 4/22 was working at 5/22 going to [employer].  Presented 05/03/2021 with generalized weakness poor appetite/nausea and vomiting as well as blurred vision x2 weeks.  Admission chemistries BUN 22 creatinine 1.65, sedimentation rate of 1, hemoglobin 18.8, RBC 6.71, TSH 5.224.  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  MRI of the brain showed hyperintense T2 weighted signal and mild  contrast-enhancement within the medial thalami and periaqueductal gray matter.  Demyelinating disease remained a primary consideration.  MRI of the orbits with and without contrast were considered.  CSF sampling also considered.  No demyelinating lesion of the cervical thoracic cord noted.  MRI of the orbits with and without contrast showed normal appearance of the orbits.  MRI cervical thoracic spine showed T7-8 small right subarticular disc protrusion without stenosis.  T8-9 small left subarticular disc protrusion without stenosis.  Renal ultrasound showed no hydronephrosis.  Neurology follow-up work-up for NMO recommended serum for AQ P4-IgG antibody pending, MOG-IgG antibody negative.  Lumbar puncture completed 05/08/2021-unremarkable no growth.  She did complete a 5-day course of high-dose steroids and 3 days of high-dose thiamine.  Started on plasmapheresis with plans of 5 total sessions last session 05/17/2021.  Ophthalmology was consulted Dr. 07/18/2021 with a funduscopic exam completed that showed papillary retinal edema appeared concerning for elevated ICP and they recommended no further intervention at this time given minimal fundus findings they did recommend a follow-up neuro-ophthalmology at a tertiary center.  Patient with history of H. pylori treatment initiated 05/10/2021 with amoxicillin/Biaxin twice daily, PPI twice daily for 14 days total.  A follow-up  urine study 05/07/2021 positive for ESBL E. coli discussed with infectious disease pharmacy initially on meropenem to 1 dose of fosfomycin and completed treatment and remains on contact precautions.  Patient has had bouts of urinary retention requiring in and out catheterization with Foley catheter tube recently removed.  ve rehab program.   Past Medical History  Past Medical History:  Diagnosis Date   Hypertension    Hypothyroidism    Family History  family history is not on file.  Prior Rehab/Hospitalizations:  Has the patient had prior  rehab or hospitalizations prior to admission? Yes  Has the patient had major surgery during 100 days prior to admission? No  Current Medications   Current Facility-Administered Medications:    heparin sodium (porcine) 1000 UNIT/ML injection, , , ,    acetaminophen (TYLENOL) 325 MG tablet, , , ,    acetaminophen (TYLENOL) tablet 650 mg, 650 mg, Oral, Q4H PRN, Erick Blinks, MD   amoxicillin (AMOXIL) 250 MG/5ML suspension 1,000 mg, 1,000 mg, Oral, Q12H, Hongalgi, Anand D, MD, 1,000 mg at 05/16/21 2305   bisacodyl (DULCOLAX) suppository 10 mg, 10 mg, Rectal, Once, Hongalgi, Maximino Greenland, MD   calcium carbonate (TUMS - dosed in mg elemental calcium) 500 MG chewable tablet, , , ,    calcium carbonate (TUMS - dosed in mg elemental calcium) chewable tablet 400 mg of elemental calcium, 2 tablet, Oral, Q3H, Erick Blinks, MD   calcium gluconate 2 g/ 100 mL sodium chloride IVPB, 2 g, Intravenous, To Hemo, Erick Blinks, MD   calcium gluconate 2-0.675 GM/100ML-% IVPB, , , ,    Chlorhexidine Gluconate Cloth 2 % PADS 6 each, 6 each, Topical, Daily, Venora Maples, MD, 6 each at 05/15/21 1800   citrate dextrose (ACD-A anticoagulant) 0.73-2.45-2.2 GM/100ML solution, , , ,    citrate dextrose (ACD-A anticoagulant) solution 1,000 mL, 1,000 mL, Other, Continuous, Derry Lory, Salman, MD   clarithromycin (BIAXIN) tablet 500 mg, 500 mg, Oral, Q12H, Spongberg, Susy Frizzle, MD, 500 mg at 05/16/21 2114   diphenhydrAMINE (BENADRYL) 25 mg capsule, , , ,    diphenhydrAMINE (BENADRYL) capsule 25 mg, 25 mg, Oral, Q6H PRN, Erick Blinks, MD   feeding supplement (BOOST / RESOURCE BREEZE) liquid 1 Container, 1 Container, Oral, TID BM, Spongberg, Susy Frizzle, MD, 1 Container at 05/16/21 2114   heparin sodium (porcine) injection 1,000 Units, 1,000 Units, Intracatheter, Once, Erick Blinks, MD   levothyroxine (SYNTHROID, LEVOTHROID) injection 50 mcg, 50 mcg, Intravenous, Daily, Marinda Elk,  MD, 50 mcg at 05/16/21 1249   metoprolol succinate (TOPROL-XL) 24 hr tablet 25 mg, 25 mg, Oral, Daily, Marinda Elk, MD, 25 mg at 05/16/21 1209   multivitamin with minerals tablet 1 tablet, 1 tablet, Oral, Daily, Spongberg, Susy Frizzle, MD, 1 tablet at 05/16/21 1210   ondansetron (ZOFRAN) tablet 4 mg, 4 mg, Oral, Q6H PRN **OR** ondansetron (ZOFRAN) injection 4 mg, 4 mg, Intravenous, Q6H PRN, Venora Maples, MD, 4 mg at 05/14/21 2321   pantoprazole (PROTONIX) EC tablet 40 mg, 40 mg, Oral, BID, Spongberg, Susy Frizzle, MD, 40 mg at 05/16/21 2113   polyethylene glycol (MIRALAX / GLYCOLAX) packet 17 g, 17 g, Oral, BID, Hongalgi, Maximino Greenland, MD   senna-docusate (Senokot-S) tablet 2 tablet, 2 tablet, Oral, Daily, Hongalgi, Anand D, MD   traZODone (DESYREL) tablet 25 mg, 25 mg, Oral, QHS PRN, Venora Maples, MD  Patients Current Diet:  Diet Order             DIET DYS 3  Room service appropriate? Yes; Fluid consistency: Thin  Diet effective now                  Precautions / Restrictions Precautions Precautions: Fall Precaution Comments: pt fell at home PTA Restrictions Weight Bearing Restrictions: No   Has the patient had 2 or more falls or a fall with injury in the past year?No  Prior Activity Level Community (5-7x/wk): decline in function over past 2 months pta. Required extensive help 2 weeks pta.   Prior Functional Level Prior Function Level of Independence: Independent Comments: prior to 4/22 pt was working at [employer] and going to [employer] Since 4/22 pt has been mostly in bed and getting weaker and weaker, family has been helping with ambulation and ADL's  Self Care: Did the patient need help bathing, dressing, using the toilet or eating?  Independent  Indoor Mobility: Did the patient need assistance with walking from room to room (with or without device)? Independent  Stairs: Did the patient need assistance with internal or external stairs (with or without  device)? Independent  Functional Cognition: Did the patient need help planning regular tasks such as shopping or remembering to take medications? Independent  Home Assistive Devices / Equipment Home Assistive Devices/Equipment: Wheelchair Home Equipment: Wheelchair - manual, Shower seat  Prior Device Use: Indicate devices/aids used by the patient prior to current illness, exacerbation or injury? None of the above  Current Functional Level Cognition  Overall Cognitive Status: Impaired/Different from baseline Difficult to assess due to: Level of arousal Current Attention Level: Selective Orientation Level: Oriented X4 Following Commands: Follows one step commands consistently, Follows one step commands with increased time Safety/Judgement: Decreased awareness of safety, Decreased awareness of deficits General Comments: requiring increased time to process commands. Improved awareness of deficits and need for assistance. STM deficits still prevalent with patient having difficulty remembering what she ordered for lunch    Extremity Assessment (includes Sensation/Coordination)  Upper Extremity Assessment: Generalized weakness RUE Deficits / Details: Limited to 80* FF, elbow ROM limited by IV, wrist and hand flex/ext, WFLs, poor grip; 3+/5  strength RUE Coordination: decreased fine motor, decreased gross motor LUE Deficits / Details: shoulder FF limited to 80*; AROM elbow through digits, WFLs fair grip strength 4-/5 throughout LUE Coordination: decreased fine motor, decreased gross motor  Lower Extremity Assessment: Defer to PT evaluation RLE Deficits / Details: decreased ROM RLE Sensation:  ("tingling") RLE Coordination: decreased gross motor, decreased fine motor LLE Deficits / Details: hip flex 2+/5, knee ext 2+/5, ankle df 2+/5 LLE Sensation:  ("tingling") LLE Coordination: decreased fine motor, decreased gross motor    ADLs  Overall ADL's : Needs assistance/impaired Eating/Feeding:  Set up, Bed level Grooming: Wash/dry face, Wash/dry hands, Minimal assistance, Sitting, Moderate assistance Grooming Details (indicate cue type and reason): Min-Mod A for balance while sitting on sara stedy at sink. Cues for sequencing washing her face and hands. Upper Body Bathing: Total assistance, Bed level Lower Body Bathing: Total assistance Upper Body Dressing : Maximal assistance Lower Body Dressing: Total assistance Toilet Transfer: +2 for physical assistance, +2 for safety/equipment, BSC, Maximal assistance Toilet Transfer Details (indicate cue type and reason): staff to use lift equip Toileting- Clothing Manipulation and Hygiene: Moderate assistance, +2 for physical assistance, Sit to/from stand Toileting - Clothing Manipulation Details (indicate cue type and reason): Mod A for posterior peri care. Min-Mod A for balance in standing. Pt particiapting in performing both naterioer and posterior peri care Functional mobility during ADLs: Maximal assistance, Minimal assistance, +2  for physical assistance (sit<>stand with stedy) General ADL Comments: Pt performing sit<>stand with stedy, grooming tasks at sink, and washing up peri area at sink.    Mobility  Overal bed mobility: Needs Assistance Bed Mobility: Rolling, Sidelying to Sit, Sit to Sidelying Rolling: Min assist Sidelying to sit: Min assist, Mod assist Supine to sit: Mod assist Sit to supine: Max assist Sit to sidelying: Min assist General bed mobility comments: required minA to roll and bring LEs off bed then trunk elevation. Increased time to perform due to fatigue    Transfers  Overall transfer level: Needs assistance Equipment used: Rolling walker (2 wheeled) Transfer via Lift Equipment: Stedy Transfers: Sit to/from [employer] to Stand: Min assist, +2 physical assistance Stand pivot transfers: Min assist, +2 physical assistance  Lateral/Scoot Transfers: Max assist General transfer comment: minA+2 to rise into stand.  Cues for hand placement. Increased time to performa and max encouragement due to fear of falling    Ambulation / Gait / Stairs / Wheelchair Mobility  Ambulation/Gait Ambulation/Gait assistance: Min assist, +2 physical assistance, +2 safety/equipment Gait Distance (Feet): 10 Feet (x5', x5') Assistive device: Rolling walker (2 wheeled) Gait Pattern/deviations: Step-through pattern, Decreased stride length, Decreased weight shift to right, Decreased weight shift to left, Shuffle, Trunk flexed, Wide base of support General Gait Details: Ambulated fwd/bwd 10' with RW requiring minA+2 for balance and RW management. Short bouts 2 x 5' with minA+2 for similar set up. Prolonged rest break required between each bout but patient motivated at progress made Gait velocity: decreased    Posture / Balance Dynamic Sitting Balance Sitting balance - Comments: close supervision for safety Balance Overall balance assessment: Needs assistance Sitting-balance support: Feet supported, Bilateral upper extremity supported Sitting balance-Leahy Scale: Fair Sitting balance - Comments: close supervision for safety Standing balance support: Bilateral upper extremity supported, During functional activity Standing balance-Leahy Scale: Poor Standing balance comment: reliant on UE support and external assist    Special needs/care consideration I and O caths Patient English speaking, Mom requires interpreter Completed PLEX 7/1 Sister staying 24/7 for support at bedside   Previous Home Environment  Living Arrangements: Parent, Other relatives (sister)  Lives With: Family (parents and siblings) Available Help at Discharge: Family, Available 24 hours/day Type of Home: House Home Layout: One level Home Access: Stairs to enter [employer] of Steps: 5 Bathroom Shower/Tub: Engineer, manufacturing systems: Standard Bathroom Accessibility: Yes How Accessible: Accessible via walker Home Care Services:  No Additional Comments: pt's older sister was present on eval and answered many questions as pt was very lethargic.  Discharge Living Setting Plans for Discharge Living Setting: Patient's home, Lives with (comment) (parents and siblings) Type of Home at Discharge: House Discharge Home Layout: One level Discharge Home Access: Stairs to enter Entrance Stairs-Rails: None Entrance Stairs-Number of Steps: 5 Discharge Bathroom Shower/Tub: Tub/shower unit Discharge Bathroom Toilet: Standard Discharge Bathroom Accessibility: Yes How Accessible: Accessible via walker Does the patient have any problems obtaining your medications?: No  Social/Family/Support Systems Contact Information: sister, Isis speaks [place]. Call Mom with update with spanish interpreter Anticipated Caregiver: family Anticipated Caregiver's Contact Information: see above Ability/Limitations of Caregiver: parents work, but family will arrnage 24/7 min assist Caregiver Availability: 24/7 Discharge Plan Discussed with Primary Caregiver: Yes Is Caregiver In Agreement with Plan?: Yes Does Caregiver/Family have Issues with Lodging/Transportation while Pt is in Rehab?: No  Goals Patient/Family Goal for Rehab: supervision to min assist with PT and OT Expected length of stay: ELOS 2 weeks Cultural Considerations: SPanish  but patient is ENglish speaking, Mom requires interpreter Additional Information: Call Mom with updates with interpreter Pt/Family Agrees to Admission and willing to participate: Yes Program Orientation Provided & Reviewed with Pt/Caregiver Including Roles  & Responsibilities: Yes  Decrease burden of Care through IP rehab admission: n/a  Possible need for SNF placement upon discharge:not anticipated  Patient Condition: This patient's medical and functional status has changed since the consult dated: 05/06/2021 in which the Rehabilitation Physician determined and documented that the patient's condition is  appropriate for intensive rehabilitative care in an inpatient rehabilitation facility. See "History of Present Illness" (above) for medical update. Functional changes are: min to mod assist. Patient's medical and functional status update has been discussed with the Rehabilitation physician and patient remains appropriate for inpatient rehabilitation. Will admit to inpatient rehab today.  Preadmission Screen Completed By:  Clois DupesBoyette, Terik Haughey Godwin, RN, 05/17/2021 11:03 AM ______________________________________________________________________   Discussed status with Dr. Riley KillSwartz on 05/17/2021 at  1115 and received approval for admission today.  Admission Coordinator:  Clois DupesBoyette, Viviane Semidey Godwin, time [id number] Date 05/17/2021

## 2021-05-17 NOTE — Progress Notes (Addendum)
Inpatient Rehabilitation Admissions Coordinator   I have insurance approval for CIR admit today after PLEX. I notified pt's sister , Sandria Bales, by phone and they are in agreement. I have alerted acute team and TOC. Dr Waymon Amato to clarify medical readiness.  Ottie Glazier, RN, MSN Rehab Admissions Coordinator [phone] 05/17/2021 10:02 AM  Dr. Waymon Amato hac ok d/c to CIR today. I will make the arrangements.  Ottie Glazier, RN, MSN Rehab Admissions Coordinator [phone] 05/17/2021 10:58 AM

## 2021-05-18 MED ORDER — LEVOTHYROXINE SODIUM 100 MCG PO TABS
100.0000 ug | ORAL_TABLET | Freq: Every day | ORAL | Status: DC
  Administered 2021-05-18 – 2021-06-05 (×19): 100 ug via ORAL
  Filled 2021-05-18 (×19): qty 1

## 2021-05-18 MED ORDER — CHLORHEXIDINE GLUCONATE CLOTH 2 % EX PADS
6.0000 | MEDICATED_PAD | Freq: Every day | CUTANEOUS | Status: DC
  Administered 2021-05-18 – 2021-05-20 (×3): 6 via TOPICAL

## 2021-05-18 NOTE — Plan of Care (Signed)
  Problem: RH Balance Goal: LTG: Patient will maintain dynamic sitting balance (OT) Description: LTG:  Patient will maintain dynamic sitting balance with assistance during activities of daily living (OT) Flowsheets (Taken 05/18/2021 1424) LTG: Pt will maintain dynamic sitting balance during ADLs with: Supervision/Verbal cueing Goal: LTG Patient will maintain dynamic standing with ADLs (OT) Description: LTG:  Patient will maintain dynamic standing balance with assist during activities of daily living (OT)  Flowsheets (Taken 05/18/2021 1424) LTG: Pt will maintain dynamic standing balance during ADLs with: Contact Guard/Touching assist   Problem: Sit to Stand Goal: LTG:  Patient will perform sit to stand in prep for activites of daily living with assistance level (OT) Description: LTG:  Patient will perform sit to stand in prep for activites of daily living with assistance level (OT) Flowsheets (Taken 05/18/2021 1424) LTG: PT will perform sit to stand in prep for activites of daily living with assistance level: Contact Guard/Touching assist   Problem: RH Eating Goal: LTG Patient will perform eating w/assist, cues/equip (OT) Description: LTG: Patient will perform eating with assist, with/without cues using equipment (OT) Flowsheets (Taken 05/18/2021 1424) LTG: Pt will perform eating with assistance level of: Independent   Problem: RH Grooming Goal: LTG Patient will perform grooming w/assist,cues/equip (OT) Description: LTG: Patient will perform grooming with assist, with/without cues using equipment (OT) Flowsheets (Taken 05/18/2021 1424) LTG: Pt will perform grooming with assistance level of: Independent   Problem: RH Bathing Goal: LTG Patient will bathe all body parts with assist levels (OT) Description: LTG: Patient will bathe all body parts with assist levels (OT) Flowsheets (Taken 05/18/2021 1424) LTG: Pt will perform bathing with assistance level/cueing: Minimal Assistance - Patient > 75%    Problem: RH Dressing Goal: LTG Patient will perform upper body dressing (OT) Description: LTG Patient will perform upper body dressing with assist, with/without cues (OT). Flowsheets (Taken 05/18/2021 1424) LTG: Pt will perform upper body dressing with assistance level of: Independent Goal: LTG Patient will perform lower body dressing w/assist (OT) Description: LTG: Patient will perform lower body dressing with assist, with/without cues in positioning using equipment (OT) Flowsheets (Taken 05/18/2021 1424) LTG: Pt will perform lower body dressing with assistance level of: Minimal Assistance - Patient > 75%   Problem: RH Toileting Goal: LTG Patient will perform toileting task (3/3 steps) with assistance level (OT) Description: LTG: Patient will perform toileting task (3/3 steps) with assistance level (OT)  Flowsheets (Taken 05/18/2021 1424) LTG: Pt will perform toileting task (3/3 steps) with assistance level: Contact Guard/Touching assist   Problem: RH Simple Meal Prep Goal: LTG Patient will perform simple meal prep w/assist (OT) Description: LTG: Patient will perform simple meal prep with assistance, with/without cues (OT). Flowsheets (Taken 05/18/2021 1424) LTG: Pt will perform simple meal prep with assistance level of: Contact Guard/Touching assist LTG: Pt will perform simple meal prep w/level of: Wheelchair level   Problem: RH Toilet Transfers Goal: LTG Patient will perform toilet transfers w/assist (OT) Description: LTG: Patient will perform toilet transfers with assist, with/without cues using equipment (OT) Flowsheets (Taken 05/18/2021 1424) LTG: Pt will perform toilet transfers with assistance level of: Contact Guard/Touching assist   Problem: RH Tub/Shower Transfers Goal: LTG Patient will perform tub/shower transfers w/assist (OT) Description: LTG: Patient will perform tub/shower transfers with assist, with/without cues using equipment (OT) Flowsheets (Taken 05/18/2021 1424) LTG: Pt  will perform tub/shower stall transfers with assistance level of: Contact Guard/Touching assist LTG: Pt will perform tub/shower transfers from: Tub/shower combination

## 2021-05-18 NOTE — Progress Notes (Signed)
Physical Therapy Session Note  Patient Details  Name: Diane Todd MRN: [id number] Date of Birth: [birth year]  Today's Date: 05/18/2021 PT Individual Time: 0941-7919 PT Individual Time Calculation (min): 38 min   Short Term Goals: Week 1:  PT Short Term Goal 1 (Week 1): Pt will perform bed mobility consistently with MinA PT Short Term Goal 2 (Week 1): Pt will perform sit to stands with consistently with minA PT Short Term Goal 3 (Week 1): Pt will perform stand pivot transfers consistently with minA PT Short Term Goal 4 (Week 1): Pt will complete 4 steps using 3 inch step with ModA, BUE support on rails in order to reach LTG  Skilled Therapeutic Interventions/Progress Updates:   Pt received supine in bed and agreeable to PT. Supine>sit transfer with mod assist  at trunk due to UE weakness with pushing to sitting EOB. Supervision assist sitting EOB with cues for position on EOB and min assist to perform posterior scoot to improve safety  Stand pivot transfer to and from [facility] with min assist and bed in elevated position and mod assist to return to bed.     Pt performed 5xSTS: 240 sec (>15 sec indicates increased fall risk). PT required provided increasing assist from min to mod assist with fatigue. Cues for pushing from LUE on arm rest and increased anterior weight shift. Significant, prolonged therapeutic rest break between each sit>stand with cues for pursed lip breathing.   Sit>supine completed with min assist at the LLE and min cues for use of rails as needed for trunk control. Bridging in bed to reposition chuck pad with min assist for LE placement. Pt eft supine in bed with call bell in reach and all needs met.     Therapy Documentation Precautions:  Precautions Precautions: Fall Precaution Comments: contact precautions, foley Restrictions Weight Bearing Restrictions: No  Vital Signs: Therapy Vitals Temp: 98.4 F (36.9 C) Pulse Rate: 100 Resp: 19 BP: (!) 93/48 Patient  Position (if appropriate): Lying Oxygen Therapy SpO2: 100 % O2 Device: Room Air Pain: Pain Assessment Pain Scale: 0-10 Pain Score: 0-No pain   Therapy/Group: Individual Therapy  Lorie Phenix 05/18/2021, 6:23 PM

## 2021-05-18 NOTE — Evaluation (Addendum)
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 year old female with history of hypertension, tobacco use, CKD stage III,  as well as thyroid disorder.  Per chart review patient lives with parent.  Independent prior to admission.  1 level home 5 steps to entry.  Prior to 4/22 was working at [employer] going to [employer].  Presented 05/03/2021 with generalized weakness poor appetite/nausea and vomiting as well as blurred vision x2 weeks.  Admission chemistries BUN 22 creatinine 1.65, sedimentation rate of 1, hemoglobin 18.8, RBC 6.71, TSH 5.224.  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  MRI of the brain showed hyperintense T2 weighted signal and mild contrast-enhancement within the medial thalami and periaqueductal gray matter.  Demyelinating disease remained a primary consideration.  MRI of the orbits with and without contrast were considered.  CSF sampling also considered.  No demyelinating lesion of the cervical thoracic cord noted.  MRI of the orbits with and without contrast showed normal appearance of the orbits.   MRI cervical thoracic spine showed T7-8 small right subarticular disc protrusion without stenosis.  T8-9 small left subarticular disc protrusion without stenosis.  Renal ultrasound showed no hydronephrosis.  Neurology follow-up work-up for NMO recommended serum for AQ P4-IgG antibody pending, MOG-IgG antibody negative.  Lumbar puncture completed 05/08/2021-unremarkable no growth.  She did complete a 5-day course of high-dose steroids and 3 days of high-dose thiamine.  Started on plasmapheresis with plans of 5 total sessions last session 05/17/2021.  Ophthalmology was consulted Dr. Deanne Coffer with a funduscopic exam completed that showed papillary retinal edema appeared concerning for elevated ICP and they recommended no further intervention at this time given minimal fundus findings they did recommend a follow-up neuro-ophthalmology at a tertiary center.  Patient with history of H. pylori treatment initiated 05/10/2021 with amoxicillin/Biaxin twice daily, PPI twice daily for 14 days total.  A follow-up urine study 05/07/2021 positive for ESBL E. coli discussed with infectious disease pharmacy initially on meropenem to 1 dose of fosfomycin and completed treatment and remains on contact precautions.  Patient has had bouts of urinary retention requiring in and out catheterization with Foley catheter tube recently removed.  Therapy evaluations completed due to patient decreased functional mobility was admitted for a comprehensive rehab program..  Patient transferred to CIR on 05/17/2021 .    Patient currently requires mod with basic self-care skills secondary to muscle weakness, decreased cardiorespiratoy endurance, unbalanced muscle activation, decreased coordination, and decreased motor planning, decreased visual acuity and decreased visual motor skills, decreased problem solving and decreased memory, and decreased standing balance, decreased postural control, and decreased balance strategies.  Prior to hospitalization,  patient could complete BADL/IADL with  independent .  Patient will benefit from skilled intervention to decrease level of assist with basic self-care skills and increase independence with basic self-care skills prior to discharge home with care partner.  Anticipate patient will require intermittent supervision and minimal physical assistance and follow up home health.  OT - End of Session Activity Tolerance: Tolerates < 10 min activity, no significant change in vital signs Endurance Deficit: Yes Endurance Deficit Description: Significant fatigue post activity. OT Assessment Rehab Potential (ACUTE ONLY): Good OT Patient demonstrates impairments in the following area(s): Balance;Cognition;Endurance;Motor;Safety;Vision;Skin Integrity OT Basic ADL's Functional Problem(s): Eating;Grooming;Bathing;Dressing;Toileting OT Advanced ADL's Functional Problem(s): Simple Meal Preparation OT Transfers Functional Problem(s): Toilet;Tub/Shower OT Additional Impairment(s): None OT Plan OT Intensity: Minimum of 1-2 x/day, 45 to 90 minutes OT Frequency: 5 out of 7 days OT Duration/Estimated Length of Stay: 2.5 to 3 weeks OT Treatment/Interventions: Balance/vestibular training;Disease mangement/prevention;Neuromuscular re-education;Self Care/advanced ADL retraining;Therapeutic Exercise;Wheelchair propulsion/positioning;UE/LE Strength taining/ROM;Skin care/wound managment;Pain management;DME/adaptive equipment instruction;Cognitive remediation/compensation;Community reintegration;Functional electrical stimulation;Patient/family education;Splinting/orthotics;UE/LE Coordination activities;Visual/perceptual remediation/compensation;Psychosocial support;Therapeutic Activities;Functional mobility training;Discharge planning OT Self Feeding Anticipated Outcome(s): ind OT Basic Self-Care Anticipated Outcome(s): S OT Toileting Anticipated Outcome(s): CGA OT Bathroom Transfers Anticipated Outcome(s): CGA OT  Recommendation Recommendations for Other Services: Neuropsych consult;Therapeutic Recreation consult Therapeutic Recreation Interventions: Pet therapy;Kitchen group;Stress management;Outing/community reintergration Patient destination: Home Follow Up Recommendations: Home health OT Equipment Recommended: To be determined   OT Evaluation Precautions/Restrictions  Precautions Precautions: Fall Precaution Comments: contact precautions, foley Restrictions Weight Bearing Restrictions: No General Chart Reviewed: Yes Family/Caregiver Present: Yes (sister)  Pain Pain Assessment Pain Scale: 0-10 Pain Score: 0-No pain Faces Pain Scale: No hurt Home Living/Prior Functioning Home Living Family/patient expects to be discharged to:: Private residence Living Arrangements: Parent, Other relatives Available Help at Discharge: Family, Available 24 hours/day Type of Home: House Home Access: Stairs to enter [employer] of Steps: 5 Home Layout: One level Bathroom Shower/Tub: Optometrist: Yes  Lives With: Family, Other (Comment) (parents and siblings) IADL History Homemaking Responsibilities: Yes Current License: Yes Education: attending [employer] for [employer] science Occupation: Part time employment Type of Occupation: [employer] and Hobbies: hanging out with friends Prior Function Level of Independence: Independent with basic ADLs, Independent with transfers, Independent with gait (prior to 4/22, since then has needed signifcant physical A for ADL)  Able to Take Stairs?: Yes Driving: Yes Vision Baseline Vision/History: No visual deficits Patient Visual Report: Blurring of vision;Diplopia Vision Assessment?: Yes Eye Alignment: Impaired (comment) (dysconjugate gaze L > R) Ocular Range of Motion: Restricted on the left;Impaired-to be further tested in functional context Alignment/Gaze Preference: Gaze left;Head  tilt Tracking/Visual Pursuits: Requires cues, head turns, or add eye shifts to track Saccades: Decreased speed of saccadic movement Convergence: Impaired (comment) Visual Fields: No apparent deficits Diplopia Assessment:  (not present during eval, reports recent diplopia during hospitalization) Perception  Perception: Within Functional Limits Praxis Praxis: Impaired Praxis Impairment Details: Motor planning Cognition Overall Cognitive Status: Impaired/Different from baseline Arousal/Alertness: Awake/alert Orientation Level: Person;Place;Situation Person: Oriented Place: Oriented Situation: Oriented Year: 2022 Month: July Day of Week: Other (Comment) (require MC of 2 options) Memory: Impaired Memory Impairment: Retrieval deficit;Storage deficit;Decreased short term memory;Decreased long term memory Immediate Memory Recall: Bed;Blue;Sock Memory Recall Sock: Without Cue Memory Recall Blue: With Cue Memory Recall Bed: Without Cue Safety/Judgment: Appears intact Sensation Sensation Light Touch: Appears Intact Hot/Cold: Appears Intact Proprioception:  (relies on vision to check foot placement) Stereognosis: Appears Intact Coordination Gross Motor Movements are Fluid and Coordinated: No Fine Motor Movements are Fluid  and Coordinated: No Coordination and Movement Description: impaired 2/2 fatigue, BLE shakiness in standing Finger Nose Finger Test: mild dysmetria bilaterally, decreased speed Motor  Motor Motor: Other (comment) Motor - Skilled Clinical Observations: impaired 2/2 fatigue, BLE shakiness in standing  Trunk/Postural Assessment  Cervical Assessment Cervical Assessment: Within Functional Limits Thoracic Assessment Thoracic Assessment: Exceptions to [facility] (rounded shoulders) Lumbar Assessment Lumbar Assessment: Exceptions to [facility] (posterior pelvic tilt) Postural Control Postural Control: Deficits on evaluation (with posterior LOB attempting standing marches)   Balance Balance Balance Assessed: Yes Static Sitting Balance Static Sitting - Balance Support: Feet supported;No upper extremity supported Static Sitting - Level of Assistance: 5: Stand by assistance Static Sitting - Comment/# of Minutes: S Dynamic Sitting Balance Dynamic Sitting - Balance Support: Feet supported Dynamic Sitting - Level of Assistance: 4: Min assist Dynamic Sitting - Balance Activities: Forward lean/weight shifting;Reaching for objects Static Standing Balance Static Standing - Balance Support: Bilateral upper extremity supported;During functional activity Static Standing - Level of Assistance: 3: Mod assist;2: Max assist (mod to power up , max to maintain) Extremity/Trunk Assessment RUE Assessment RUE Assessment: Exceptions to [facility] Active Range of Motion (AROM) Comments: grossly WFL for ADL General Strength Comments: 3+/5 in shoulder flexion, mild dysmetria LUE Assessment LUE Assessment: Exceptions to [facility] Active Range of Motion (AROM) Comments: grossly WFL for ADL General Strength Comments: 3+/5 in shoulder flexion, mild dysmetria  Care Tool Care Tool Self Care Eating   Eating Assist Level: Set up assist    Oral Care    Oral Care Assist Level: Set up assist    Bathing   Body parts bathed by patient: Right arm;Left arm;Chest;Abdomen;Front perineal area;Right upper leg;Left upper leg;Face Body parts bathed by helper: Buttocks;Right lower leg;Left lower leg   Assist Level: Moderate Assistance - Patient 50 - 74%    Upper Body Dressing(including orthotics)   What is the patient wearing?: Pull over shirt   Assist Level: Supervision/Verbal cueing    Lower Body Dressing (excluding footwear)   What is the patient wearing?: Underwear/pull up;Pants Assist for lower body dressing: Maximal Assistance - Patient 25 - 49%    Putting on/Taking off footwear   What is the patient wearing?: Non-skid slipper socks Assist for footwear: Total Assistance - Patient < 25%        Care Tool Toileting Toileting activity   Assist for toileting: Total Assistance - Patient < 25% (bed level)     Care Tool Bed Mobility Roll left and right activity   Roll left and right assist level: Minimal Assistance - Patient > 75%    Sit to lying activity   Sit to lying assist level: Maximal Assistance - Patient 25 - 49%    Lying to sitting edge of bed activity   Lying to sitting edge of bed assist level: Moderate Assistance - Patient 50 - 74%     Care Tool Transfers Sit to stand transfer   Sit to stand assist level: Moderate Assistance - Patient 50 - 74%    Chair/bed transfer Chair/bed transfer activity did not occur: Safety/medical concerns Chair/bed transfer assist level:  (no +2 available, signifcant pt fatigue)     Toilet transfer Toilet transfer activity did not occur: Safety/medical concerns Assist Level:  (no +2 available, signifcant pt fatigue)     Care Tool Cognition Expression of Ideas and Wants Expression of Ideas and Wants: Some difficulty - exhibits some difficulty with expressing needs and ideas (e.g, some words or finishing thoughts) or speech is not clear  Understanding Verbal and Non-Verbal Content Understanding Verbal and Non-Verbal Content: Usually understands - understands most conversations, but misses some part/intent of message. Requires cues at times to understand   Memory/Recall Ability *first 3 days only Memory/Recall Ability *first 3 days only: Current season;That he or she is in a hospital/hospital unit    Refer to Care Plan for [place] 1 OT Short Term Goal 1 (Week 1): Pt will complete STS in prep for standing ADL with min A + LRAD. OT Short Term Goal 2 (Week 1): Pt will completed toilet transfer with mod A with LRAD. OT Short Term Goal 3 (Week 1): Pt will verbalize 3 energy conservation strategies with min VCs. OT Short Term Goal 4 (Week 1): Pt will don pants with mod A + AE PRN.  Recommendations for  other services: Neuropsych and Therapeutic Recreation  Pet therapy, Kitchen group, Stress management, and Outing/community reintegration   Skilled Therapeutic Intervention ADL ADL Eating: Set up Where Assessed-Eating: Bed level Grooming: Minimal assistance Where Assessed-Grooming: Edge of bed Upper Body Bathing: Minimal assistance Where Assessed-Upper Body Bathing: Edge of bed Lower Body Bathing: Moderate assistance Where Assessed-Lower Body Bathing: Edge of bed Upper Body Dressing: Supervision/safety Where Assessed-Upper Body Dressing: Edge of bed Lower Body Dressing: Maximal assistance Where Assessed-Lower Body Dressing: Edge of bed Toileting: Maximal assistance;Dependent (bed level) Where Assessed-Toileting: Bed level Toilet Transfer: Not assessed Tub/Shower Transfer: Not assessed Social research officer, government: Not assessed Mobility  Bed Mobility Bed Mobility: Rolling Left Rolling Left: Minimal Assistance - Patient > 75% Transfers Sit to Stand: Moderate Assistance - Patient 50-74% Stand to Sit: Moderate Assistance - Patient 50-74%  Session Note: Pt received semi-reclined in bed with sister present, agreeable to OT eval. Denies pain throughout but endorses significant fatigue. Reviewed role of CIR OT, evaluation process, ADL/func mobility retraining, goals for therapy, and safety plan. Evaluation completed as documented above. Came to sitting EOB with mod A to lift trunk and min Vcs for log roll technique. STS x3 throughout session with /without RW with overall mod A to power up from elevated surface. Req increased time in between bouts 2/2 fatigue and fear of falling. Attempted marching in place while standing with RW, pt demonstrated moderate shakiness and experiencing 1 posterior LOB onto the bed. Donned shirt close S, donned pants max A to thread BLE and pull over hips in standing. Performed 2 lateral scoots towards head of bed with decreased buttocks clearance. Total A + 2 to scoot up  in bed. Pt anxious about being very weak and not wanting to "inconvenience" anyone, but highly motivated to get better. Pt left semi-reclined in bed with sister and RN present.   Session 2 [phone] + [phone]): Pt received hand-off from PT at room, initially agreeable to therapy, but requesting to eat lunch. Self-feeding assessment , pt with noted mild ataxic/"shaky" movements, but able to self-feed with increased time and minimal spillage. Pt given time to finish lunch. Pt left in w/c with sister present. Therapist returned 15 min later, pt finished with lunch and requesting to return to bed. Stedy transfer with max of 2 to power up. Pt attempted x2 STS , unable to clear buttocks from chair. Stedy transfer back to bed. Max of 1 to return to supine to progress BLE onto bed and then adjust trunk. Pt requesting reiteration of purpose of CIR stay, sister and pt with no further questions at this time. Pt left semi-reclined in bed with NT and sister present,  bed alarm engaged, and all immediate needs met.   Discharge Criteria: Patient will be discharged from OT if patient refuses treatment 3 consecutive times without medical reason, if treatment goals not met, if there is a change in medical status, if patient makes no progress towards goals or if patient is discharged from hospital.  The above assessment, treatment plan, treatment alternatives and goals were discussed and mutually agreed upon: by patient and by family  Volanda Napoleon MS, OTR/L  05/18/2021, 12:59 PM

## 2021-05-18 NOTE — Evaluation (Signed)
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  Per chart review patient lives with parent.  Independent prior to admission.  1 level home 5 steps to entry.  Prior to 4/22 was working at [employer] going to [employer].  Presented 05/03/2021 with generalized weakness poor appetite/nausea and vomiting as well as blurred vision x2 weeks.  Admission chemistries BUN 22 creatinine 1.65, sedimentation rate of 1, hemoglobin 18.8, RBC 6.71, TSH 5.224.  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  MRI of the brain showed hyperintense T2 weighted signal and mild contrast-enhancement within the medial thalami and periaqueductal gray matter.  Demyelinating disease remained a primary consideration.  MRI of the orbits with and without contrast were considered.  CSF sampling also considered.  No demyelinating lesion of the cervical thoracic cord noted.  MRI of the orbits with and without contrast showed normal appearance of the orbits.  MRI cervical thoracic spine showed T7-8 small right subarticular  disc protrusion without stenosis.  T8-9 small left subarticular disc protrusion without stenosis.  Renal ultrasound showed no hydronephrosis.  Neurology follow-up work-up for NMO recommended serum for AQ P4-IgG antibody pending, MOG-IgG antibody negative.  Lumbar puncture completed 05/08/2021-unremarkable no growth.  She did complete a 5-day course of high-dose steroids and 3 days of high-dose thiamine.  Started on plasmapheresis with plans of 5 total sessions last session 05/17/2021.  Ophthalmology was consulted Dr. Deanne Coffer with a funduscopic exam completed that showed papillary retinal edema appeared concerning for elevated ICP and they recommended no further intervention at this time given minimal fundus findings they did recommend a follow-up neuro-ophthalmology at a tertiary center.  Patient with history of H. pylori treatment initiated 05/10/2021 with amoxicillin/Biaxin twice daily, PPI twice daily for 14 days total.  A follow-up urine study 05/07/2021 positive for ESBL E. coli discussed with infectious disease pharmacy initially on meropenem to 1 dose of fosfomycin and completed treatment and remains on contact precautions.  Patient has had bouts of urinary retention requiring in and out catheterization with Foley catheter tube recently removed.  Therapy evaluations completed due to patient decreased functional mobility was admitted for a comprehensive rehab program.  Patient transferred to CIR on 05/17/2021 .   Patient currently requires mod with mobility secondary to muscle weakness, decreased cardiorespiratoy endurance, decreased coordination, and decreased sitting balance, decreased standing balance, decreased postural control, and decreased balance strategies.  Prior to hospitalization, patient was independent  with mobility and lived with Family in a House home.  Home access is 5Stairs to enter.  Patient will benefit from skilled PT intervention to maximize safe functional mobility, minimize fall  risk,  and decrease caregiver burden for planned discharge home with 24 hour supervision.  Anticipate patient will benefit from follow up OP at discharge.  PT - End of Session Endurance Deficit: Yes Endurance Deficit Description: Pt requires frequent rest breaks with increased fatigue post activity PT Assessment Rehab Potential (ACUTE/IP ONLY): Good PT Barriers to Discharge: Home environment access/layout;Behavior;Incontinence PT Barriers to Discharge Comments: Pt demonstrates PT Patient demonstrates impairments in the following area(s): Balance;Endurance;Safety;Motor PT Transfers Functional Problem(s): Bed Mobility;Bed to Chair;Car;Furniture PT Locomotion Functional Problem(s): Ambulation;Wheelchair Mobility;Stairs PT Plan PT Intensity: Minimum of 1-2 x/day ,45 to 90 minutes PT Frequency: Total of 15 hours over 7 days of combined therapies PT Duration Estimated Length of Stay: 17-21 days PT Treatment/Interventions: Ambulation/gait training;Discharge planning;Functional mobility training;Psychosocial support;Therapeutic Activities;Visual/perceptual remediation/compensation;Balance/vestibular training;Disease management/prevention;Neuromuscular re-education;Skin care/wound management;Wheelchair propulsion/positioning;Therapeutic Exercise;Cognitive remediation/compensation;DME/adaptive equipment instruction;Pain management;Splinting/orthotics;UE/LE Strength taining/ROM;Community reintegration;Functional electrical stimulation;Stair training;Patient/family education;UE/LE Coordination activities PT Recommendation Patient destination: Home Equipment Recommended: Standard walker Equipment Details: pt reports she has a wheelchair and shower chair at home   PT Evaluation Precautions/Restrictions Precautions Precautions: Fall Precaution Comments: contact precautions, foley Restrictions Weight Bearing Restrictions: No General Chart Reviewed: Yes Family/Caregiver Present: Yes (Sister present  during in room portion of evaluation)  Pain Pain Assessment Pain Scale: 0-10 Pain Score: 0-No pain Home Living/Prior Functioning Home Living Available Help at Discharge: Family;Available 24 hours/day Type of Home: House Home Access: Stairs to enter [employer] of Steps: 5 Entrance Stairs-Rails: Right Home Layout: One level Bathroom Shower/Tub: Chiropodist: Standard Bathroom Accessibility: Yes  Lives With: Family Prior Function Level of Independence: Independent with basic ADLs;Independent with gait;Independent with transfers;Other (comment) (prior to 4/22 independent.  Per sister, pt required increase in physical assistance since 4/22 and just before hospitalization was on wheelchair)  Able to Take Stairs?: Yes Driving: Yes Leisure: Hobbies-yes (Comment) (loves to paint and create funny memes) Vision/Perception  Vision - Assessment Eye Alignment: Impaired (comment) Ocular Range of Motion: Within Functional Limits Alignment/Gaze Preference: Within Defined Limits Tracking/Visual Pursuits: Able to track stimulus in all quads without difficulty Saccades: Decreased speed of saccadic movement Convergence: Impaired (comment)  Cognition Overall Cognitive Status: Impaired/Different from baseline Arousal/Alertness: Awake/alert Orientation Level: Oriented X4 Memory: Impaired (pt demonstrates difficulty with remembering details about recent events) Memory Impairment: Storage deficit;Retrieval deficit;Decreased long term memory;Decreased short term memory Awareness: Appears intact Safety/Judgment: Impaired (pt is limited in her ability to judge when she is becoming fatigued and will rarely express fatigue even though physically requiring a break) Sensation Sensation Light Touch: Appears Intact Coordination Gross Motor Movements are Fluid and Coordinated: No Fine Motor Movements are Fluid and Coordinated: No Coordination and Movement Description: impaired  2/2 fatigue, BLE shakiness in standing Finger Nose Finger Test: noted dyskinetic movements with decreased speed Motor  Motor Motor: Other (comment) Motor - Skilled Clinical Observations: impaired 2/2 fatigue and weakness, decreased speed of movements, increased time to initiate (could be 2/2 pt appearing to have a decrease in confidence of her movements)   Trunk/Postural Assessment  Thoracic Assessment Thoracic Assessment: Exceptions to [facility] (rounded shoulders, could be corrected with verbal/tactile cuing) Postural Control Postural Control: Deficits on evaluation (decreased postural sway/control with increased fatigue)  Balance Balance Balance Assessed: Yes Static Sitting Balance Static Sitting - Balance Support: Feet unsupported;No upper extremity supported Static Sitting - Level of Assistance: 5: Stand by assistance Dynamic Sitting Balance Sitting balance - Comments: close supervision for safety Static Standing Balance Static Standing - Balance Support: Bilateral upper extremity supported;During functional activity Static Standing - Level of  Assistance: 4: Min assist;3: Mod assist (static standing assess in short bouts with standing breaks. BUE supported and primarily MinA, progressing to [place] with fatigue) Extremity Assessment  RLE Assessment RLE Assessment: Exceptions to [facility] RLE Strength RLE Overall Strength: Deficits Right Hip Flexion: 3+/5 Right Hip ABduction: 4-/5 Right Hip ADduction: 4/5 Right Knee Flexion: 4-/5 Right Knee Extension: 4-/5 Right Ankle Dorsiflexion: 4-/5 Right Ankle Plantar Flexion: 3+/5 LLE Assessment LLE Assessment: Exceptions to WFL LLE Strength LLE Overall Strength: Deficits Left Hip Flexion: 3/5 Left Hip ABduction: 4-/5 Left Hip ADduction: 4/5 Left Knee Flexion: 3+/5 Left Knee Extension: 3+/5 Left Ankle Dorsiflexion: 3+/5 Left Ankle Plantar Flexion: 4-/5  Care Tool Care Tool Bed Mobility Roll left and right activity   Roll left and right  assist level: Minimal Assistance - Patient > 75%    Sit to lying activity   Sit to lying assist level: Maximal Assistance - Patient 25 - 49%    Lying to sitting edge of bed activity   Lying to sitting edge of bed assist level: Moderate Assistance - Patient 50 - 74%     Care Tool Transfers Sit to stand transfer   Sit to stand assist level: Moderate Assistance - Patient 50 - 74%    Chair/bed transfer    N/A -handed off to OT sitting in wheelchair     Toilet transfer Toilet transfer activity did not occur: Safety/medical concerns      Car transfer   Car transfer assist level: Moderate Assistance - Patient 50 - 74%      Care Tool Locomotion Ambulation   Assist level: Moderate Assistance - Patient 50 - 74% (Moderate assist for steadying secondary to fatigue) Assistive device: Walker-rolling Max distance: 12ft  Walk 10 feet activity   Assist level: Minimal Assistance - Patient > 75% Assistive device: Walker-rolling   Walk 50 feet with 2 turns activity Walk 50 feet with 2 turns activity did not occur: Safety/medical concerns      Walk 150 feet activity Walk 150 feet activity did not occur: Safety/medical concerns      Walk 10 feet on uneven surfaces activity Walk 10 feet on uneven surfaces activity did not occur: Safety/medical concerns      Stairs Stair activity did not occur: Safety/medical concerns        Walk up/down 1 step activity Walk up/down 1 step or curb (drop down) activity did not occur: Safety/medical concerns     Walk up/down 4 steps activity did not occuR: Safety/medical concerns  Walk up/down 4 steps activity  Walk up/down 4 steps activity did not occur: Safety/medical concerns    Walk up/down 12 steps activity Walk up/down 12 steps activity did not occur: Safety/medical concerns      Pick up small objects from floor Pick up small object from the floor (from standing position) activity did not occur: Safety/medical concerns      Wheelchair Will  patient use wheelchair at discharge?: Yes Type of Wheelchair: Manual   Wheelchair assist level: Minimal Assistance - Patient > 75% Max wheelchair distance: 50 ft  Wheel 50 feet with 2 turns activity   Assist Level: Minimal Assistance - Patient > 75%  Wheel 150 feet activity Wheelchair 150 feet activity did not occur: Safety/medical concerns      Refer to Care Plan for Long Term Goals  SHORT TERM GOAL WEEK 1 PT Short Term Goal 1 (Week 1): Pt will perform bed mobility consistently with MinA PT Short Term Goal 2 (Week 1): Pt will  perform sit to stands with consistently with minA PT Short Term Goal 3 (Week 1): Pt will perform stand pivot transfers consistently with minA PT Short Term Goal 4 (Week 1): Pt will complete 4 steps using 3 inch step with ModA, BUE support on rails in order to reach LTG  Recommendations for other services: None   Skilled Therapeutic Intervention Mobility Bed Mobility Bed Mobility: Rolling Left Rolling Left: Minimal Assistance - Patient > 75% Transfers Transfers: Sit to Stand;Stand to Sit;Stand Pivot Transfers;Transfer via Lift Equipment Sit to Stand: Moderate Assistance - Patient 50-74% (MinA x2 when in hemi height wheelchair) Stand to Sit: Moderate Assistance - Patient 50-74% Stand Pivot Transfers: Minimal Assistance - Patient > 75% Stand Pivot Transfer Details: Verbal cues for precautions/safety;Verbal cues for gait pattern;Verbal cues for safe use of DME/AE;Other (comment) Stand Pivot Transfer Details (indicate cue type and reason): Verbal cuing for management of COG withing RW BOS and technique with turning. Verbal cuing for spatial awareness with backwards steps into chair. Min manual assistance with steadying 2/2 weakness/fatigue. Transfer (Assistive device): Rolling walker Clarise Cruz [place] with initial transfer into wheelchair. Stand pivot transfers after) Transfer via Lift Equipment: Animal nutritionist: Yes Gait Assistance: Minimal  Assistance - Patient > 75%;Moderate Assistance - Patient 50-74% (Ambulation primarily at [place] end of 517f ambulation 2/2 to fatigue. +2 wc follow for safety) Gait Distance (Feet): 44 Feet (773f [id number] device: Rolling walker Gait Assistance Details: Verbal cues for precautions/safety;Verbal cues for gait pattern Gait Gait: Yes Gait Pattern: Impaired Gait Pattern: Decreased step length - right;Decreased step length - left;Decreased stride length;Decreased dorsiflexion - right;Decreased dorsiflexion - left;Shuffle Gait velocity: decreased Stairs / Additional Locomotion Stairs: No WheeArchitects Wheelchair Assistance: ContDevelopment worker, international aidth upper extremities Wheelchair Parts Management: Needs assistance Distance: 50ft66fischarge Criteria: Patient will be discharged from PT if patient refuses treatment 3 consecutive times without medical reason, if treatment goals not met, if there is a change in medical status, if patient makes no progress towards goals or if patient is discharged from hospital.  The above assessment, treatment plan, treatment alternatives and goals were discussed and mutually agreed upon: by patient and by family  Cherrill Scrima, SPT  05/18/2021, 6:11 PM

## 2021-05-18 NOTE — Plan of Care (Signed)
  Problem: Consults Goal: RH GENERAL PATIENT EDUCATION Description: See Patient Education module for education specifics. Outcome: Progressing Goal: Nutrition Consult-if indicated Outcome: Progressing   Problem: RH BOWEL ELIMINATION Goal: RH STG MANAGE BOWEL WITH ASSISTANCE Description: STG Manage Bowel with Min Assistance. Outcome: Progressing   Problem: RH BLADDER ELIMINATION Goal: RH STG MANAGE BLADDER WITH ASSISTANCE Description: STG Manage Bladder With Min Assistance Outcome: Progressing   Problem: RH SKIN INTEGRITY Goal: RH STG MAINTAIN SKIN INTEGRITY WITH ASSISTANCE Description: STG Maintain Skin Integrity With Min Assistance. Outcome: Progressing   Problem: RH SAFETY Goal: RH STG ADHERE TO SAFETY PRECAUTIONS W/ASSISTANCE/DEVICE Description: STG Adhere to Safety Precautions With Supervision Assistance/Device. Outcome: Progressing   Problem: RH KNOWLEDGE DEFICIT GENERAL Goal: RH STG INCREASE KNOWLEDGE OF SELF CARE AFTER HOSPITALIZATION Description: Patient will demonstrate knowledge of medication management, safety precautions, and HTN management with educational materials and handouts provided by staff independently at discharge. Outcome: Progressing   

## 2021-05-18 NOTE — Progress Notes (Signed)
PROGRESS NOTE   Subjective/Complaints: Pt had a fair night. No problems with foley. Happy to be starting therapies today  ROS: Patient denies fever, rash, sore throat, blurred vision, nausea, vomiting, diarrhea, cough, shortness of breath or chest pain, joint or back pain, headache, or mood change.    Objective:   No results found. Recent Labs    05/16/21 0500 05/17/21 0430  WBC 8.9 11.6*  HGB 11.2* 11.5*  HCT 35.4* 35.9*  PLT 190 186   Recent Labs    05/16/21 0500 05/17/21 0430  NA 143 143  K 3.4* 4.0  CL 112* 114*  CO2 25 23  GLUCOSE 94 100*  BUN 6 9  CREATININE 0.76 0.87  CALCIUM 8.4* 8.7*    Intake/Output Summary (Last 24 hours) at 05/18/2021 1627 Last data filed at 05/18/2021 0500 Gross per 24 hour  Intake --  Output 700 ml  Net -700 ml        Physical Exam: Vital Signs Blood pressure (!) 93/48, pulse 100, temperature 98.4 F (36.9 C), resp. rate 19, height 5\' 7"  (1.702 m), weight 98.5 kg, SpO2 100 %.  General:   No apparent distress HEENT: Head is normocephalic, atraumatic, PERRLA, EOMI, sclera anicteric, oral mucosa pink and moist, dentition intact, ext ear canals clear,  Neck: Supple without JVD or lymphadenopathy Heart: Reg rate and rhythm. No murmurs rubs or gallops Chest: CTA bilaterally without wheezes, rales, or rhonchi; no distress Abdomen: Soft, non-tender, non-distended, bowel sounds positive. Extremities: No clubbing, cyanosis, or edema. Pulses are 2+ Psych: Pt's affect is appropriate. Pt is cooperative Skin: Clean and intact without signs of breakdown Neuro: alert, oriented to person, place, mo/year. Fair insight and awareness. Blurred vision with diplopia. Motor 4- to 4/5 UE's and 3-4/5 LE's. Sensory 1+/2 LE seem more affected than uppers. DTR's 3+ in all 4's.  Musculoskeletal: Full ROM, No pain with AROM or PROM in the neck, trunk, or extremities. Posture appropriate     Assessment/Plan: 1. Functional deficits which require 3+ hours per day of interdisciplinary therapy in a comprehensive inpatient rehab setting. Physiatrist is providing close team supervision and 24 hour management of active medical problems listed below. Physiatrist and rehab team continue to assess barriers to discharge/monitor patient progress toward functional and medical goals  Care Tool:  Bathing    Body parts bathed by patient: Right arm, Left arm, Chest, Abdomen, Front perineal area, Right upper leg, Left upper leg, Face   Body parts bathed by helper: Buttocks, Right lower leg, Left lower leg     Bathing assist Assist Level: Moderate Assistance - Patient 50 - 74%     Upper Body Dressing/Undressing Upper body dressing   What is the patient wearing?: Pull over shirt    Upper body assist Assist Level: Supervision/Verbal cueing    Lower Body Dressing/Undressing Lower body dressing      What is the patient wearing?: Underwear/pull up, Pants     Lower body assist Assist for lower body dressing: Maximal Assistance - Patient 25 - 49%     Toileting Toileting    Toileting assist Assist for toileting: Total Assistance - Patient < 25% (bed level)     Transfers  Chair/bed transfer  Transfers assist  Chair/bed transfer activity did not occur: Safety/medical concerns  Chair/bed transfer assist level:  (no +2 available, signifcant pt fatigue)     Locomotion Ambulation   Ambulation assist              Walk 10 feet activity   Assist           Walk 50 feet activity   Assist           Walk 150 feet activity   Assist           Walk 10 feet on uneven surface  activity   Assist           Wheelchair     Assist               Wheelchair 50 feet with 2 turns activity    Assist            Wheelchair 150 feet activity     Assist          Blood pressure (!) 93/48, pulse 100, temperature 98.4 F (36.9  C), resp. rate 19, height 5\' 7"  (1.702 m), weight 98.5 kg, SpO2 100 %.  Medical Problem List and Plan: 1.  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  Patient received 5 days IV Solu-Medrol/5 treatments and plasmapheresis             -patient may shower             -ELOS/Goals: 14 days, supervision to min assist with PT and OT  --Patient is beginning CIR therapies today including PT, OT, and SLP  2.  Antithrombotics: -DVT/anticoagulation: SCDs             -antiplatelet therapy: N/A 3. Pain Management: Tylenol as needed 4. Mood: Provide emotional support             -antipsychotic agents: N/A 5. Neuropsych: This patient is capable of making decisions on her own behalf. 6. Skin/Wound Care: Routine skin checks 7. Fluids/Electrolytes/Nutrition: Routine in and outs with follow-up chemistries 8.  Hypertension.  Toprol-XL 25 mg daily.  Monitor with increased mobility 9.  Hypothyroidism.  Continue Synthroid. 10.  H. pylori.  Treatment initiated 6/24.  Amoxicillin 1 g, Biaxin 500 mg twice daily, PPI twice daily x14 days total 11.  UTI/ESBL/E. Coli/urinary retention.  Contact precautions.  Patient completed course of fosfomycin.  Foley replaced today             -recheck urine culture---not done. Will request again             -continue trial of flomax, urecholine             -consider another voiding trial tomorrow 12.  Obesity.  BMI 31.28.  Dietary follow-up 13.  CKD stage III.  Follow-up chemistries on admit 14.  Tobacco abuse.  Counseling    LOS: 1 days A FACE TO FACE EVALUATION WAS PERFORMED  7/24 05/18/2021, 4:27 PM

## 2021-05-19 NOTE — Progress Notes (Signed)
PROGRESS NOTE   Subjective/Complaints: Diane Todd liked therapy yesterday. Was happy to get up and move. No new problems overnight. Denies pain. Vision might be a little better  ROS: Patient denies fever, rash, sore throat,  , nausea, vomiting, diarrhea, cough, shortness of breath or chest pain, joint or back pain, headache, or mood change.    Objective:   No results found. Recent Labs    05/17/21 0430  WBC 11.6*  HGB 11.5*  HCT 35.9*  PLT 186   Recent Labs    05/17/21 0430  NA 143  K 4.0  CL 114*  CO2 23  GLUCOSE 100*  BUN 9  CREATININE 0.87  CALCIUM 8.7*    Intake/Output Summary (Last 24 hours) at 05/19/2021 1040 Last data filed at 05/19/2021 0534 Gross per 24 hour  Intake --  Output 950 ml  Net -950 ml        Physical Exam: Vital Signs Blood pressure 109/66, pulse 94, temperature 99.1 F (37.3 C), temperature source Oral, resp. rate 20, height 5\' 7"  (1.702 m), weight 98.4 kg, SpO2 100 %.  Constitutional: No distress . Vital signs reviewed. HEENT: EOMI, oral membranes moist Neck: supple Cardiovascular: RRR without murmur. No JVD    Respiratory/Chest: CTA Bilaterally without wheezes or rales. Normal effort    GI/Abdomen: BS +, non-tender, non-distended Ext: no clubbing, cyanosis, or edema Psych: pleasant and cooperative  Skin: Clean and intact without signs of breakdown Neuro: alert, oriented to person, place, mo/year. Fair insight and awareness. Blurred vision but diplopia less today, tracking better to all fields. Motor 4- to 4/5 UE's and 3-4/5 LE's. Sensory 1+/2 LE seem more affected than uppers--no change. DTR's 3+ in all 4's.  Musculoskeletal: Full ROM, No pain with AROM or PROM in the neck, trunk, or extremities. Posture appropriate    Assessment/Plan: 1. Functional deficits which require 3+ hours per day of interdisciplinary therapy in a comprehensive inpatient rehab setting. Physiatrist is  providing close team supervision and 24 hour management of active medical problems listed below. Physiatrist and rehab team continue to assess barriers to discharge/monitor patient progress toward functional and medical goals  Care Tool:  Bathing    Body parts bathed by patient: Right arm, Left arm, Chest, Abdomen, Front perineal area, Right upper leg, Left upper leg, Face   Body parts bathed by helper: Buttocks, Right lower leg, Left lower leg     Bathing assist Assist Level: Moderate Assistance - Patient 50 - 74%     Upper Body Dressing/Undressing Upper body dressing   What is the patient wearing?: Pull over shirt    Upper body assist Assist Level: Supervision/Verbal cueing    Lower Body Dressing/Undressing Lower body dressing      What is the patient wearing?: Underwear/pull up, Pants     Lower body assist Assist for lower body dressing: Maximal Assistance - Patient 25 - 49%     Toileting Toileting    Toileting assist Assist for toileting: Total Assistance - Patient < 25% (bed level)     Transfers Chair/bed transfer  Transfers assist  Chair/bed transfer activity did not occur: N/A  Chair/bed transfer assist level:  (no +2 available, signifcant pt  fatigue)     Locomotion Ambulation   Ambulation assist      Assist level: Moderate Assistance - Patient 50 - 74% (Moderate assist for steadying secondary to fatigue) Assistive device: Walker-rolling Max distance: 49ft   Walk 10 feet activity   Assist     Assist level: Minimal Assistance - Patient > 75% Assistive device: Walker-rolling   Walk 50 feet activity   Assist Walk 50 feet with 2 turns activity did not occur: Safety/medical concerns         Walk 150 feet activity   Assist Walk 150 feet activity did not occur: Safety/medical concerns         Walk 10 feet on uneven surface  activity   Assist Walk 10 feet on uneven surfaces activity did not occur: Safety/medical concerns          Wheelchair     Assist Will patient use wheelchair at discharge?: Yes Type of Wheelchair: Manual    Wheelchair assist level: Minimal Assistance - Patient > 75% Max wheelchair distance: 50 ft    Wheelchair 50 feet with 2 turns activity    Assist        Assist Level: Minimal Assistance - Patient > 75%   Wheelchair 150 feet activity     Assist  Wheelchair 150 feet activity did not occur: Safety/medical concerns       Blood pressure 109/66, pulse 94, temperature 99.1 F (37.3 C), temperature source Oral, resp. rate 20, height 5\' 7"  (1.702 m), weight 98.4 kg, SpO2 100 %.  Medical Problem List and Plan: 1.  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  Patient received 5 days IV Solu-Medrol/5 treatments and plasmapheresis             -patient may shower             -ELOS/Goals: 14 days, supervision to min assist with PT and OT  -Continue CIR therapies including PT, OT, and SLP  2.  Antithrombotics: -DVT/anticoagulation: SCDs             -antiplatelet therapy: N/A 3. Pain Management: Tylenol as needed 4. Mood: Provide emotional support             -antipsychotic agents: N/A 5. Neuropsych: This patient is capable of making decisions on her own behalf. 6. Skin/Wound Care: Routine skin checks 7. Fluids/Electrolytes/Nutrition: Routine in and outs with follow-up chemistries 8.  Hypertension.  Toprol-XL 25 mg daily.  Monitor with increased mobility  7/3 bp well controlled. 9.  Hypothyroidism.  Continue Synthroid. 10.  H. pylori.  Treatment initiated 6/24.  Amoxicillin 1 g, Biaxin 500 mg twice daily, PPI twice daily x14 days total 11.  UTI/ESBL/E. Coli/urinary retention.  Contact precautions.  Patient completed course of fosfomycin.  Foley replaced today             -repeat UCX sent yesterday             -continue trial of flomax, urecholine             -will remove foley again and repeat voiding trial   -to bsc or toilet to void 12.  Obesity.  BMI  31.28.  Dietary follow-up 13.  CKD stage III.  Follow-up chemistries tomorrow 14.  Tobacco abuse.  Counseling    LOS: 2 days A FACE TO FACE EVALUATION WAS PERFORMED  7/24 05/19/2021, 10:40 AM

## 2021-05-19 NOTE — Discharge Instructions (Addendum)
Inpatient Rehab Discharge Instructions  Diane Todd Discharge date and time: No discharge date for patient encounter.   Activities/Precautions/ Functional Status: Activity: As tolerated Diet: Regular Wound Care: Routine skin checks Functional status:  ___ No restrictions     ___ Walk up steps independently ___ 24/7 supervision/assistance   ___ Walk up steps with assistance ___ Intermittent supervision/assistance  ___ Bathe/dress independently ___ Walk with walker     _x__ Bathe/dress with assistance ___ Walk Independently    ___ Shower independently ___ Walk with assistance    ___ Shower with assistance ___ No alcohol     ___ Return to work/school ________  Special Instructions:  No driving smoking or alcohol    COMMUNITY REFERRALS UPON DISCHARGE:    Outpatient: PT, OT, SP             Agency:[facility] [phone]              Appointment Date/Time:WILL CALL SISTER TO SET UP APPOINTMENTS  Medical Equipment/Items Ordered:ROLLING WALKER, 3 IN 1 AND TUB BENCH                                                 Agency/Supplier:[facility]  [phone]  My questions have been answered and I understand these instructions. I will adhere to these goals and the provided educational materials after my discharge from the hospital.  Patient/Caregiver Signature _______________________________ Date __________  Clinician Signature _______________________________________ Date __________  Please bring this form and your medication list with you to all your follow-up doctor's appointments.

## 2021-05-19 NOTE — Plan of Care (Signed)
  Problem: Consults Goal: RH GENERAL PATIENT EDUCATION Description: See Patient Education module for education specifics. Outcome: Progressing Goal: Nutrition Consult-if indicated Outcome: Progressing   Problem: RH BOWEL ELIMINATION Goal: RH STG MANAGE BOWEL WITH ASSISTANCE Description: STG Manage Bowel with Min Assistance. Outcome: Progressing   Problem: RH BLADDER ELIMINATION Goal: RH STG MANAGE BLADDER WITH ASSISTANCE Description: STG Manage Bladder With Min Assistance Outcome: Progressing   Problem: RH SKIN INTEGRITY Goal: RH STG MAINTAIN SKIN INTEGRITY WITH ASSISTANCE Description: STG Maintain Skin Integrity With Min Assistance. Outcome: Progressing   Problem: RH SAFETY Goal: RH STG ADHERE TO SAFETY PRECAUTIONS W/ASSISTANCE/DEVICE Description: STG Adhere to Safety Precautions With Supervision Assistance/Device. Outcome: Progressing   Problem: RH KNOWLEDGE DEFICIT GENERAL Goal: RH STG INCREASE KNOWLEDGE OF SELF CARE AFTER HOSPITALIZATION Description: Patient will demonstrate knowledge of medication management, safety precautions, and HTN management with educational materials and handouts provided by staff independently at discharge. Outcome: Progressing   

## 2021-05-19 NOTE — Progress Notes (Signed)
Foley cath removed per md order; balloon deflated prior to removal.  pt tolerated well. Diane Todd

## 2021-05-20 LAB — BASIC METABOLIC PANEL
Anion gap: 8 (ref 5–15)
BUN: 11 mg/dL (ref 6–20)
CO2: 23 mmol/L (ref 22–32)
Calcium: 9.1 mg/dL (ref 8.9–10.3)
Chloride: 113 mmol/L — ABNORMAL HIGH (ref 98–111)
Creatinine, Ser: 1.07 mg/dL — ABNORMAL HIGH (ref 0.44–1.00)
GFR, Estimated: >60 mL/min (ref >60)
Glucose, Bld: 100 mg/dL — ABNORMAL HIGH (ref 70–99)
Potassium: 3.7 mmol/L (ref 3.5–5.1)
Sodium: 144 mmol/L (ref 135–145)

## 2021-05-20 LAB — CBC
HCT: 36.1 % (ref 36.0–46.0)
Hemoglobin: 11.3 g/dL — ABNORMAL LOW (ref 12.0–15.0)
MCH: 28.7 pg (ref 26.0–34.0)
MCHC: 31.3 g/dL (ref 30.0–36.0)
MCV: 91.6 fL (ref 80.0–100.0)
Platelets: 196 10*3/uL (ref 150–400)
RBC: 3.94 MIL/uL (ref 3.87–5.11)
RDW: 21.5 % — ABNORMAL HIGH (ref 11.5–15.5)
WBC: 6.2 10*3/uL (ref 4.0–10.5)
nRBC: 0 % (ref 0.0–0.2)

## 2021-05-20 LAB — URINE CULTURE: Culture: NO GROWTH

## 2021-05-20 LAB — VITAMIN B1: Vitamin B1 (Thiamine): 31 nmol/L — ABNORMAL LOW (ref 66.5–200.0)

## 2021-05-20 MED ORDER — THIAMINE HCL 100 MG PO TABS
100.0000 mg | ORAL_TABLET | Freq: Every day | ORAL | Status: DC
  Administered 2021-05-20 – 2021-06-05 (×17): 100 mg via ORAL
  Filled 2021-05-20 (×17): qty 1

## 2021-05-20 MED ORDER — POTASSIUM CHLORIDE 20 MEQ PO PACK
20.0000 meq | PACK | Freq: Once | ORAL | Status: AC
  Administered 2021-05-20: 20 meq via ORAL
  Filled 2021-05-20 (×2): qty 1

## 2021-05-20 NOTE — Evaluation (Signed)
Speech Language Pathology Assessment and Plan  Patient Details  Name: Diane Todd MRN: [id number] Date of Birth: 12-22-2000  SLP Diagnosis: Cognitive Impairments  Rehab Potential: Good ELOS: 2.5-3 weeks    Today's Date: 05/20/2021 SLP Individual Time: 0705-0800 SLP Individual Time Calculation (min): 62 min   Hospital Problem: Principal Problem:   Neuromyelitis optica (devic) ([place])  Past Medical History:  Past Medical History:  Diagnosis Date   Hypertension    Hypothyroidism    Past Surgical History:  Past Surgical History:  Procedure Laterality Date   IR FLUORO GUIDE CV LINE RIGHT  05/08/2021   IR US GUIDE VASC ACCESS RIGHT  05/08/2021    Assessment / Plan / Recommendation Clinical Impression  Patient is a 20 y.o. year old female with history of hypertension, tobacco use, CKD stage III,  as well as thyroid disorder.  Per chart review patient lives with parent.  Independent prior to admission.  1 level home 5 steps to entry.  Prior to 4/22 was working at [employer] going to [employer].  Presented 05/03/2021 with generalized weakness poor appetite/nausea and vomiting as well as blurred vision x2 weeks.  Admission chemistries BUN 22 creatinine 1.65, sedimentation rate of 1, hemoglobin 18.8, RBC 6.71, TSH 5.224.  Initial work-up at outside [facility] showed lesions highly suggestive of NMSOD(Neuromyelitis Optica Spectrum Disorder).  MRI of the brain showed hyperintense T2 weighted signal and mild contrast-enhancement within the medial thalami and periaqueductal gray matter.  Demyelinating disease remained a primary consideration.  MRI of the orbits with and without contrast were considered.  CSF sampling also considered.  No demyelinating lesion of the cervical thoracic cord noted.  MRI of the orbits with and without contrast showed normal appearance of the orbits.  MRI cervical thoracic spine showed T7-8 small right subarticular disc protrusion without stenosis.  T8-9 small left  subarticular disc protrusion without stenosis.  Renal ultrasound showed no hydronephrosis.  Neurology follow-up work-up for NMO recommended serum for AQ P4-IgG antibody pending, MOG-IgG antibody negative.  Lumbar puncture completed 05/08/2021-unremarkable no growth.  She did complete a 5-day course of high-dose steroids and 3 days of high-dose thiamine.  Started on plasmapheresis with plans of 5 total sessions last session 05/17/2021.  Ophthalmology was consulted Dr. Deanne Coffer with a funduscopic exam completed that showed papillary retinal edema appeared concerning for elevated ICP and they recommended no further intervention at this time given minimal fundus findings they did recommend a follow-up neuro-ophthalmology at a tertiary center.  Patient with history of H. pylori treatment initiated 05/10/2021 with amoxicillin/Biaxin twice daily, PPI twice daily for 14 days total.  A follow-up urine study 05/07/2021 positive for ESBL E. coli discussed with infectious disease pharmacy initially on meropenem to 1 dose of fosfomycin and completed treatment and remains on contact precautions.  Patient has had bouts of urinary retention requiring in and out catheterization with Foley catheter tube recently removed.  Therapy evaluations completed due to patient decreased functional mobility was admitted for a comprehensive rehab program..  Patient transferred to CIR on 05/17/2021 .     Pt presents with mild-moderate cognitive deficits in mildly complex problem solving, emergent awareness and short term recall skills, further supported by formal cognitive linguistic assessment SLUMS scoring 26 out 30 (n=>27.) Pt was able to recall 3 out 5 words with a 5 minute delay and respond accurately to 4 out 4 questions following listening to a short paragraph. Pt demonstrated deficits in working and short term recall when subtracting mental math on SLUMS  subsection as well as accuracy on 2 out 5 daily math problems from  ALFA. Pt benefited  from use of written aid and SLP provided memory notebook to aid in daily recall. Pt was able to space out numbers on clock accurately but required step by step assistance to set time, noting deficits in problem solving. Pt presents with normal swallow function when consuming regular textures and thin liquids as well as oral motor exam was unremarkable. SLP upgraded diet to regular textures and thin liquids. Pt supports no changes in speech. Pt would benefit from skilled ST services focusing on cognitive skills in order to maximize functional independence and reduce burden of care (pt was independent, working and attending school prior to acute impairments) likely requiring intermittent supervision and continued ST services.    Skilled Therapeutic Interventions          Skilled ST services focused on cognitive skills. SLP administered formal and informal cognitive linguistic assessments. SLP provided education pertaining to results, current deficits and goals during CIR stay. All questions were answered to satisfaction. Pt was left in room with family, bed alarm set and call bell within reach. Recommend to continue skilled ST services.     SLP Assessment  Patient will need skilled Speech Lanaguage Pathology Services during CIR admission    Recommendations  SLP Diet Recommendations: Thin;Age appropriate regular solids Liquid Administration via: Cup;Straw Medication Administration: Whole meds with liquid Supervision: Patient able to self feed Compensations: Slow rate;Small sips/bites Postural Changes and/or Swallow Maneuvers: Seated upright 90 degrees Oral Care Recommendations: Oral care BID Recommendations for Other Services: Neuropsych consult Patient destination: Home Follow up Recommendations: Home Health SLP;Outpatient SLP Equipment Recommended: None recommended by SLP    SLP Frequency 1 to 3 out of 7 days   SLP Duration  SLP Intensity  SLP Treatment/Interventions 2.5-3 weeks  Minumum of  1-2 x/day, 30 to 90 minutes  Cognitive remediation/compensation;Cueing hierarchy;Medication managment;Internal/external aids;Functional tasks;Patient/family education    Pain Pain Assessment Pain Scale: 0-10 Pain Score: 0-No pain  Prior Functioning Cognitive/Linguistic Baseline: Within functional limits Type of Home: House  Lives With: Family Available Help at Discharge: Family;Available 24 hours/day Education: attending [employer] for political science  SLP Evaluation Cognition Overall Cognitive Status: Impaired/Different from baseline Arousal/Alertness: Awake/alert Orientation Level: Oriented X4 (Simultaneous filing. User may not have seen previous data.) Attention: Selective Selective Attention: Appears intact Memory: Impaired Memory Impairment: Storage deficit;Retrieval deficit;Decreased long term memory;Decreased short term memory Awareness: Impaired Awareness Impairment: Emergent impairment Problem Solving: Impaired Problem Solving Impairment: Verbal complex;Functional complex Safety/Judgment: Impaired  Comprehension Auditory Comprehension Overall Auditory Comprehension: Appears within functional limits for tasks assessed Yes/No Questions: Within Functional Limits Commands: Within Functional Limits Conversation: Complex Interfering Components: Working Curator: Within Sports administrator Reading Comprehension Reading Status: Not tested Expression Expression Primary Mode of Expression: Verbal Verbal Expression Overall Verbal Expression: Appears within functional limits for tasks assessed Initiation: No impairment Level of Generative/Spontaneous Verbalization: Conversation Naming: No impairment Written Expression Dominant Hand: Left Oral Motor Oral Motor/Sensory Function Overall Oral Motor/Sensory Function: Within functional limits  Care Tool Care Tool Cognition Expression of Ideas and Wants Expression of Ideas and  Wants: Some difficulty - exhibits some difficulty with expressing needs and ideas (e.g, some words or finishing thoughts) or speech is not clear   Understanding Verbal and Non-Verbal Content Understanding Verbal and Non-Verbal Content: Usually understands - understands most conversations, but misses some part/intent of message. Requires cues at times to understand   Memory/Recall Ability *first 3 days  only Memory/Recall Ability *first 3 days only: Current season;Location of own room;That he or she is in a hospital/hospital unit     PMSV Assessment  PMSV Trial    Bedside Swallowing Assessment General Diet Prior to this Study: Dysphagia 3 (soft);Thin liquids Behavior/Cognition: Alert;Cooperative;Pleasant mood Oral Cavity - Dentition: Adequate natural dentition Self-Feeding Abilities: Able to feed self Vision: Functional for self-feeding Patient Positioning: Upright in bed Baseline Vocal Quality: Normal Volitional Cough: Strong Volitional Swallow: Able to elicit  Oral Care Assessment Does patient have any of the following "high(er) risk" factors?: None of the above Does patient have any of the following "at risk" factors?: None of the above Patient is HIGH RISK: Non-ventilated: Order set for Adult Oral Care Protocol initiated - "High Risk Patients - Non-Ventilated" option selected  (see row information) Patient is LOW RISK: Follow universal precautions (see row information) Ice Chips Ice chips: Not tested Thin Liquid Thin Liquid: Within functional limits Presentation: Cup;Straw Nectar Thick Nectar Thick Liquid: Not tested Honey Thick Honey Thick Liquid: Not tested Puree Puree: Not tested Solid Solid: Within functional limits Presentation: Self Fed BSE Assessment Risk for Aspiration Impact on safety and function: No limitations  Short Term Goals: Week 1: SLP Short Term Goal 1 (Week 1): Pt will demonstrate problem solving skills in mildly complex tasks with min A verbal  cues. SLP Short Term Goal 2 (Week 1): Pt will self-monitor and self-correct functional errors with min A verbal cues. SLP Short Term Goal 3 (Week 1): Pt will demonstrate recall of novel, daily events with mod A verbal cues. SLP Short Term Goal 4 (Week 1): Pt will demonstrate recall of novel, daily events with use of written aid given supervision A verbal cues.  Refer to Care Plan for Long Term Goals  Recommendations for other services: Neuropsych  Discharge Criteria: Patient will be discharged from SLP if patient refuses treatment 3 consecutive times without medical reason, if treatment goals not met, if there is a change in medical status, if patient makes no progress towards goals or if patient is discharged from hospital.  The above assessment, treatment plan, treatment alternatives and goals were discussed and mutually agreed upon: by patient and by family  Oris Calmes  [facility] 05/20/2021, 2:57 PM

## 2021-05-20 NOTE — Progress Notes (Signed)
Physical Therapy Session Note  Patient Details  Name: Diane Todd MRN: [id number] Date of Birth: 11-Jul-2001  Today's Date: 05/20/2021 PT Individual Time: 1000-1057 and 1415-1443 PT Individual Time Calculation (min): 57 min and 28 min  Short Term Goals: Week 1:  PT Short Term Goal 1 (Week 1): Pt will perform bed mobility consistently with MinA PT Short Term Goal 2 (Week 1): Pt will perform sit to stands with consistently with minA PT Short Term Goal 3 (Week 1): Pt will perform stand pivot transfers consistently with minA PT Short Term Goal 4 (Week 1): Pt will complete 4 steps using 3 inch step with ModA, BUE support on rails in order to reach LTG  Skilled Therapeutic Interventions/Progress Updates:   Treatment Session 1 Received pt sitting in [facility] with sister present at bedside, pt agreeable to PT treatment, and denied any pain during session but reported fatigue from previous OT session. Session with emphasis on functional mobility/transfers, generalized strengthening, dynamic standing balance/coordination, gait training, and improved standing tolerance. Pt transported to 4W dayroom in [facility] total A for time management purposes. Pt transferred sit<>stand with RW and mod A of 1 x 2 trials with cues for hand placement on WC armrests and for anterior weight shifting. Pt ambulated 19ft x 1 and 32ft x 1 with RW and min A +2 for close WC follow. Pt demonstrates mild LE ataxia, flexed trunk, and decreased foot clearance/step length. Worked on dynamic standing balance, fine motor control/coordination, and dual tasking painting patriotic rock using RUE with CGA/min A for balance for ~5 minutes prior to sitting due to LE fatigue. Noted pt with mild unsteadiness and incoordination with fine motor control; therefore provided pt with coloring pages/pencils to continue working on in room. Pt transported back to room in [facility] total A. Concluded session with pt sitting in WC, needs within reach, and chair pad alarm  on. Sister present at bedside and encouraged pt to update memory notebook.   Of note, pt very fearful of falling throughout session requiring encouragement and additional time to build up confidence.   Treatment Session 2 Received pt sitting in [facility] with brother present at bedside, pt agreeable to PT treatment, and denied any pain during session but reported fatigue. Session with emphasis on functional mobility/transfers, generalized strengthening, dynamic standing balance/coordination, gait training, and improved activity tolerance. Pt initially required 3 attempts and mod A to stand with RW with cues for scooting to edge of chair, hand placement on WC armrests, and anterior weight shifting. Pt ambulated 131ft x 1 and 263ft x 1 with RW and min A +2 for WC follow. Pt demonstrates increased unsteadiness with turns due to impaired motor planning/sequencing. Pt requested to return to bed at end of session and transferred sit<>supine with supervision and scooted to [facility] with supervision pulling on headboard. Concluded session with pt supine in bed, needs within reach, and bed alarm on.   Therapy Documentation Precautions:  Precautions Precautions: Fall Precaution Comments: contact precautions, foley Restrictions Weight Bearing Restrictions: No  Therapy/Group: Individual Therapy Martin Majestic PT, DPT   05/20/2021, 7:24 AM

## 2021-05-20 NOTE — Progress Notes (Signed)
Pt willing to get up with staff to void. Pt voided. Bladder scanned for 0. Pt does state she does not void often at home.

## 2021-05-20 NOTE — Progress Notes (Signed)
Occupational Therapy Session Note  Patient Details  Name: Diane Todd MRN: [id number] Date of Birth: 11/13/2001  Today's Date: 05/20/2021 OT Individual Time: 2585-2778 OT Individual Time Calculation (min): 55 min    Short Term Goals: Week 1:  OT Short Term Goal 1 (Week 1): Pt will complete STS in prep for standing ADL with min A + LRAD. OT Short Term Goal 2 (Week 1): Pt will completed toilet transfer with mod A with LRAD. OT Short Term Goal 3 (Week 1): Pt will verbalize 3 energy conservation strategies with min VCs. OT Short Term Goal 4 (Week 1): Pt will don pants with mod A + AE PRN.  Skilled Therapeutic Interventions/Progress Updates:    Treatment session with focus on self-care retraining, sit> stand, dynamic standing balance during bathing and dressing tasks.  Pt received upright in bed finishing breakfast.  Pt agreeable to attempting bathing/dressing at EOB, incorporating sit > stand during LB bathing and dressing.  Pt completed bed mobility with CGA to come to sitting EOB.  Pt completing sit > stand x4 throughout session with min-CGA and then able to stand with CGA while therapist washed buttocks.  Pt able to don pants in figure 4 position and don socks prior to standing to pull pants over hips.  Pt required assistance to advance pants over buttocks (due to incontinence brief). Pt completed UB bathing with setup and then assistance to pull sports bra over back.  Pt completed squat pivot transfer to w/c with min-mod assist requiring multimodal cues for anterior weight shift and encouragement as pt very fearful with all mobility.  Pt completed oral care seated at sink with setup and then use of BUE to maintain grasp on cup.  Pt remained upright in w/c with all needs in reach and sister present.  Therapy Documentation Precautions:  Precautions Precautions: Fall Precaution Comments: contact precautions, foley Restrictions Weight Bearing Restrictions: No  Pain:  Pt with no c/o  pain    Therapy/Group: Individual Therapy  Rosalio Loud 05/20/2021, 9:50 AM

## 2021-05-20 NOTE — Progress Notes (Signed)
PROGRESS NOTE   Subjective/Complaints: No complaints this morning Denies pain, constipation, insomnia (sister helps, as patient has impaired memory) AOx3  ROS: Patient denies fever, rash, sore throat,  , nausea, vomiting, diarrhea, cough, shortness of breath or chest pain, joint or back pain, headache, or mood change, constipation   Objective:   No results found. Recent Labs    05/20/21 0505  WBC 6.2  HGB 11.3*  HCT 36.1  PLT 196   Recent Labs    05/20/21 0505  NA 144  K 3.7  CL 113*  CO2 23  GLUCOSE 100*  BUN 11  CREATININE 1.07*  CALCIUM 9.1    Intake/Output Summary (Last 24 hours) at 05/20/2021 1035 Last data filed at 05/19/2021 1345 Gross per 24 hour  Intake 0 ml  Output 400 ml  Net -400 ml        Physical Exam: Vital Signs Blood pressure 102/63, pulse 86, temperature 97.7 F (36.5 C), temperature source Oral, resp. rate 20, height 5\' 7"  (1.702 m), weight 98.6 kg, SpO2 99 %.  Gen: no distress, normal appearing HEENT: oral mucosa pink and moist, NCAT Cardio: Reg rate Chest: normal effort, normal rate of breathing Abd: soft, non-distended Ext: no edema Psych: pleasant, normal affect Skin: Clean and intact without signs of breakdown Neuro: alert, oriented to person, place, mo/year. Fair insight and awareness. Blurred vision but diplopia less today, tracking better to all fields. Motor 4- to 4/5 UE's and 3-4/5 LE's. Sensory 1+/2 LE seem more affected than uppers--no change. DTR's 3+ in all 4's.  Musculoskeletal: Full ROM, No pain with AROM or PROM in the neck, trunk, or extremities. Posture appropriate    Assessment/Plan: 1. Functional deficits which require 3+ hours per day of interdisciplinary therapy in a comprehensive inpatient rehab setting. Physiatrist is providing close team supervision and 24 hour management of active medical problems listed below. Physiatrist and rehab team continue to  assess barriers to discharge/monitor patient progress toward functional and medical goals  Care Tool:  Bathing    Body parts bathed by patient: Right arm, Left arm, Chest, Abdomen, Front perineal area, Right upper leg, Left upper leg, Face   Body parts bathed by helper: Buttocks, Right lower leg, Left lower leg     Bathing assist Assist Level: Moderate Assistance - Patient 50 - 74%     Upper Body Dressing/Undressing Upper body dressing   What is the patient wearing?: Bra, Pull over shirt    Upper body assist Assist Level: Minimal Assistance - Patient > 75%    Lower Body Dressing/Undressing Lower body dressing      What is the patient wearing?: Pants, Incontinence brief     Lower body assist Assist for lower body dressing: Moderate Assistance - Patient 50 - 74%     Toileting Toileting    Toileting assist Assist for toileting: Total Assistance - Patient < 25% (bed level)     Transfers Chair/bed transfer  Transfers assist  Chair/bed transfer activity did not occur: N/A  Chair/bed transfer assist level: Moderate Assistance - Patient 50 - 74%     Locomotion Ambulation   Ambulation assist      Assist level: Moderate Assistance -  Patient 50 - 74% (Moderate assist for steadying secondary to fatigue) Assistive device: Walker-rolling Max distance: 48ft   Walk 10 feet activity   Assist     Assist level: Minimal Assistance - Patient > 75% Assistive device: Walker-rolling   Walk 50 feet activity   Assist Walk 50 feet with 2 turns activity did not occur: Safety/medical concerns         Walk 150 feet activity   Assist Walk 150 feet activity did not occur: Safety/medical concerns         Walk 10 feet on uneven surface  activity   Assist Walk 10 feet on uneven surfaces activity did not occur: Safety/medical concerns         Wheelchair     Assist Will patient use wheelchair at discharge?: Yes Type of Wheelchair: Manual    Wheelchair  assist level: Minimal Assistance - Patient > 75% Max wheelchair distance: 50 ft    Wheelchair 50 feet with 2 turns activity    Assist        Assist Level: Minimal Assistance - Patient > 75%   Wheelchair 150 feet activity     Assist  Wheelchair 150 feet activity did not occur: Safety/medical concerns       Blood pressure 102/63, pulse 86, temperature 97.7 F (36.5 C), temperature source Oral, resp. rate 20, height 5\' 7"  (1.702 m), weight 98.6 kg, SpO2 99 %.  Medical Problem List and Plan: 1.  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  Patient received 5 days IV Solu-Medrol/5 treatments and plasmapheresis             -patient may shower             -ELOS/Goals: 14 days, supervision to min assist with PT and OT  -Continue CIR therapies including PT, OT, and SLP  2.  Antithrombotics: -DVT/anticoagulation: SCDs             -antiplatelet therapy: N/A 3. Pain Management: N/A 4. Mood: Provide emotional support             -antipsychotic agents: N/A 5. Neuropsych: This patient is capable of making decisions on her own behalf. 6. Skin/Wound Care: Routine skin checks 7. Fluids/Electrolytes/Nutrition: Routine in and outs with follow-up chemistries 8.  Hypertension.  Toprol-XL 25 mg daily.  Monitor with increased mobility  7/4 bp well controlled. 9.  Hypothyroidism.  Continue Synthroid. 10.  H. pylori.  Treatment initiated 6/24.  Amoxicillin 1 g, Biaxin 500 mg twice daily, PPI twice daily x14 days total 11.  UTI/ESBL/E. Coli/urinary retention.  Contact precautions.  Patient completed course of fosfomycin.  Foley discontinued             -UC with no growth.              -continue trial of flomax, urecholine   -to bsc or toilet to void 12.  Obesity.  BMI 31.28.  Dietary follow-up 13.  CKD stage III.  Cr is trending upward again. Repeat tomorrow to trend. Nursing order placed to encourage 6-8 glasses of water per day.  14.  Tobacco abuse.  Counseling 15.  Hypokalemia: klor 7/24 ordered today, repeat BMP tomorrow.     LOS: 3 days A FACE TO FACE EVALUATION WAS PERFORMED  P Evalin Shawhan 05/20/2021, 10:35 AM

## 2021-05-20 NOTE — Progress Notes (Signed)
Asked pt if she has voided since last night. Pt states "I don't think I have". Instructed pt on importance of urinating and asked pt if she wanted to get up and try to void. Pt agree to try "in little bit". Will follow up.

## 2021-05-20 NOTE — Progress Notes (Signed)
Inpatient Rehabilitation Center Individual Statement of Services  Patient Name:  Diane Todd  Date:  05/20/2021  Welcome to the Inpatient Rehabilitation Center.  Our goal is to provide you with an individualized program based on your diagnosis and situation, designed to meet your specific needs.  With this comprehensive rehabilitation program, you will be expected to participate in at least 3 hours of rehabilitation therapies Monday-Friday, with modified therapy programming on the weekends.  Your rehabilitation program will include the following services:  Physical Therapy (PT), Occupational Therapy (OT), 24 hour per day rehabilitation nursing, Therapeutic Recreaction (TR), Neuropsychology, Care Coordinator, Rehabilitation Medicine, Nutrition Services, and Pharmacy Services  Weekly team conferences will be held on Tuesday to discuss your progress.  Your Inpatient Rehabilitation Care Coordinator will talk with you frequently to get your input and to update you on team discussions.  Team conferences with you and your family in attendance may also be held.  Expected length of stay: 17-21 days  Overall anticipated outcome: CGA-min level  Depending on your progress and recovery, your program may change. Your Inpatient Rehabilitation Care Coordinator will coordinate services and will keep you informed of any changes. Your Inpatient Rehabilitation Care Coordinator's name and contact numbers are listed  below.  The following services may also be recommended but are not provided by the Inpatient Rehabilitation Center:   Home Health Rehabiltiation Services Outpatient Rehabilitation Services Vocational Rehabilitation   Arrangements will be made to provide these services after discharge if needed.  Arrangements include referral to agencies that provide these services.  Your insurance has been verified to be:  [employer] Your primary doctor is:  Dr Olena Leatherwood  Pertinent information will be  shared with your doctor and your insurance company.  Inpatient Rehabilitation Care Coordinator:  Dossie Der, [place] [phone] or Luna Glasgow  Information discussed with and copy given to patient by: Lucy Chris, 05/20/2021, 11:16 AM

## 2021-05-20 NOTE — Progress Notes (Signed)
Occupational Therapy Session Note  Patient Details  Name: Diane Todd MRN: [id number] Date of Birth: December 20, 2000  Today's Date: 05/20/2021 OT Individual Time: 5329-9242 OT Individual Time Calculation (min): 42 min    Short Term Goals: Week 1:  OT Short Term Goal 1 (Week 1): Pt will complete STS in prep for standing ADL with min A + LRAD. OT Short Term Goal 2 (Week 1): Pt will completed toilet transfer with mod A with LRAD. OT Short Term Goal 3 (Week 1): Pt will verbalize 3 energy conservation strategies with min VCs. OT Short Term Goal 4 (Week 1): Pt will don pants with mod A + AE PRN.  Skilled Therapeutic Interventions/Progress Updates:    Treatment session with focus on sit > stand and dynamic standing balance.  Pt received upright in w/c with brother present.  Pt transported to Dayroom in w/c for time management purposes.  Engaged in sit > stand and dynamic standing balance at high-low table progressing to standing without UE support.  Pt required min assist throughout, requiring increased rest breaks and time as session progressed due to fatigue.  Engaged in Connect 4 followed by horse shoe toss in standing while focusing on single hand support and functional use of RUE.  Noted decreased fine motor control during Connect 4 and then decreased gross motor and strength when tossing horse shoes.  Pt CGA for all standing activities this session.  Pt returned to room and remained upright in w/c with seat alarm on and all needs in reach.  Therapy Documentation Precautions:  Precautions Precautions: Fall Precaution Comments: contact precautions, foley Restrictions Weight Bearing Restrictions: No General:   Vital Signs: Therapy Vitals Temp: 98.6 F (37 C) Temp Source: Oral Pulse Rate: 60 (manual pulse irregular) Resp: 17 BP: 100/69 Patient Position (if appropriate): Sitting Oxygen Therapy SpO2: 100 % O2 Device: Room Air Pain: Pain Assessment Pain Score: 0-No  pain    Therapy/Group: Individual Therapy  Rosalio Loud 05/20/2021, 3:11 PM

## 2021-05-20 NOTE — IPOC Note (Signed)
Overall Plan of Care [facility]) Patient Details Name: Diane Todd MRN: [id number] DOB: 2001/01/10  Admitting Diagnosis: Neuromyelitis optica (devic) [facility])  Hospital Problems: Principal Problem:   Neuromyelitis optica (devic) (HCC)     Functional Problem List: Nursing Behavior, Bladder, Bowel, Endurance, Medication Management, Nutrition, Safety  PT Balance, Endurance, Safety, Motor  OT Balance, Cognition, Endurance, Motor, Safety, Vision, Skin Integrity  SLP Cognition  TR         Basic ADL's: OT Eating, Grooming, Bathing, Dressing, Toileting     Advanced  ADL's: OT Simple Meal Preparation     Transfers: PT Bed Mobility, Bed to Chair, Car, Occupational psychologist, Research scientist (life sciences): PT Ambulation, Psychologist, prison and probation services, Stairs     Additional Impairments: OT None  SLP Social Cognition   Problem Solving, Memory, Awareness  TR      Anticipated Outcomes Item Anticipated Outcome  Self Feeding ind  Swallowing      Basic self-care  S  Toileting  CGA   Bathroom Transfers CGA  Bowel/Bladder  supervision  Transfers     Locomotion     Communication     Cognition  Supervision A, Mod I  Pain  N/A  Safety/Judgment  supervision and no falls   Therapy Plan: PT Intensity: Minimum of 1-2 x/day ,45 to 90 minutes PT Frequency: Total of 15 hours over 7 days of combined therapies PT Duration Estimated Length of Stay: 17-21 days OT Intensity: Minimum of 1-2 x/day, 45 to 90 minutes OT Frequency: 5 out of 7 days OT Duration/Estimated Length of Stay: 2.5 to 3 weeks SLP Intensity: Minumum of 1-2 x/day, 30 to 90 minutes SLP Frequency: 1 to 3 out of 7 days SLP Duration/Estimated Length of Stay: 2.5-3 weeks   Due to the current state of emergency, patients may not be receiving their 3-hours of Medicare-mandated therapy.   Team Interventions: Nursing Interventions Patient/Family Education, Bladder Management, Bowel Management, Disease Management/Prevention,  Medication Management, Discharge Planning, Psychosocial Support  PT interventions Ambulation/gait training, Discharge planning, Functional mobility training, Psychosocial support, Therapeutic Activities, Visual/perceptual remediation/compensation, Balance/vestibular training, Disease management/prevention, Neuromuscular re-education, Skin care/wound management, Wheelchair propulsion/positioning, Therapeutic Exercise, Cognitive remediation/compensation, DME/adaptive equipment instruction, Pain management, Splinting/orthotics, UE/LE Strength taining/ROM, Community reintegration, Development worker, international aid stimulation, Museum/gallery curator, Equities trader education, UE/LE Coordination activities  OT Interventions Warden/ranger, Disease mangement/prevention, Neuromuscular re-education, Self Care/advanced ADL retraining, Therapeutic Exercise, Wheelchair propulsion/positioning, UE/LE Strength taining/ROM, Skin care/wound managment, Pain management, DME/adaptive equipment instruction, Cognitive remediation/compensation, Community reintegration, Functional electrical stimulation, Patient/family education, Splinting/orthotics, UE/LE Coordination activities, Visual/perceptual remediation/compensation, Psychosocial support, Therapeutic Activities, Functional mobility training, Discharge planning  SLP Interventions Cognitive remediation/compensation, Cueing hierarchy, Medication managment, Internal/external aids, Functional tasks, Patient/family education  TR Interventions    SW/CM Interventions Discharge Planning, Psychosocial Support, Patient/Family Education   Barriers to Discharge MD  Medical stability and Neurogenic bowel and bladder  Nursing Decreased caregiver support, Home environment access/layout, Incontinence, Lack of/limited family support, Weight, Medication compliance, Behavior, Nutrition means Lives with parents, house with 5 steps to enter.  PT Home environment access/layout, Behavior,  Incontinence Pt demonstrates  OT      SLP      SW       Team Discharge Planning: Destination: PT-Home ,OT- Home , SLP-Home Projected Follow-up: PT- , OT-  Home health OT, SLP-Home Health SLP, Outpatient SLP Projected Equipment Needs: PT-Standard walker, OT- To be determined, SLP-None recommended by SLP Equipment Details: PT-pt reports she has a wheelchair and shower chair at home, OT-  Patient/family involved in  discharge planning: PT- Patient, Family member/caregiver,  OT-Patient, Family member/caregiver, SLP-Patient  MD ELOS: 14 days Medical Rehab Prognosis:  Excellent Assessment: 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  Patient received 5 days IV Solu-Medrol/5 treatments and plasmapheresis. Medications are being managed, and labs and vitals are being monitored regularly.     See Team Conference Notes for weekly updates to the plan of care

## 2021-05-20 NOTE — Progress Notes (Signed)
Inpatient Rehabilitation Care Coordinator Assessment and Plan Patient Details  Name: Diane Todd MRN: [id number] Date of Birth: 2001-08-10  Today's Date: 05/20/2021  Hospital Problems: Principal Problem:   Neuromyelitis optica (devic) [facility])  Past Medical History:  Past Medical History:  Diagnosis Date   Hypertension    Hypothyroidism    Past Surgical History:  Past Surgical History:  Procedure Laterality Date   IR FLUORO GUIDE CV LINE RIGHT  05/08/2021   IR US GUIDE VASC ACCESS RIGHT  05/08/2021   Social History:  reports that she has been smoking e-cigarettes. She has never used smokeless tobacco. She reports previous alcohol use. She reports previous drug use.  Family / Support Systems Marital Status: Single Patient Roles: Parent, Other (Comment) (sibling) Other Supports: parents speak spanish  Isis-sister [phone]-cell Anticipated Caregiver: Mom and other family members Ability/Limitations of Caregiver: Mom tkang time off of work to assist pt and then will have other family members with her Caregiver Availability: 24/7 Family Dynamics: Close knit family who will pull together to provide assist to pt. 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 Read: Yes Write: Yes Employment Status: Unemployed Date Retired/Disabled/Unemployed: Working for [employer] until 02/2021 had decline in medical status Legal History/Current Legal Issues: No issues Guardian/Conservator: NOne-according to MD pt is capable of making decisions while here, sister plans to be here with her while she is here-for support   Abuse/Neglect Abuse/Neglect Assessment Can Be Completed: Yes Physical Abuse: Denies Verbal Abuse: Denies Sexual Abuse: Denies Exploitation of  patient/patient's resources: Denies Self-Neglect: Denies  Emotional Status Pt's affect, behavior and adjustment status: Pt has always been independent until this and wants to do for herself and not rely upon her parents. She is feeling better getting out of bed and moving with therapies but still causes her anxiety, has a fear of falling Recent Psychosocial Issues: other health issues-recent decline in function causing hospitalization Psychiatric History: History of depression/anxiety takes medications and finds them helpful. Is feeling better today due to moving around and not just in the bed and can see progress. Would benefit from seeing neuro-psych while here Substance Abuse History: Tobacco plans to quit  Patient / Family Perceptions, Expectations & Goals Pt/Family understanding of illness & functional limitations: Pt and sister can explain her condition as mcuh as MD's have told them. They still have questions and want to be kept updated by MD. Premorbid pt/family roles/activities: Daughter, Consulting civil engineer, employee, sister, niece, etc Anticipated changes in roles/activities/participation: resume Pt/family expectations/goals: Pt states: " I want to do well here, but am fearful, I hope this gets better."  Sister states: " I hope she does well I know she wants too and will do her best."  [employer]: None Premorbid Home Care/DME Agencies: Other (Comment) (wheelchair and tub seat) Transportation available at discharge: Family members Resource referrals recommended: Neuropsychology  Discharge Planning Living Arrangements: Parent, Other relatives Support Systems: Parent, Other relatives, Friends/neighbors Type of Residence: Private residence Insurance Resources: [employer] (specify county) Surveyor, quantity Resources: Family Youth worker Screen Referred: No Living Expenses: Lives with family Money Management: Family Does the patient have any problems obtaining your  medications?: No Home Management: Family members Patient/Family Preliminary Plans: Return home with parents and sister. Mom plans to take some time off of work to be with her and then other  family will be there. Pt hopes to get to mod/i and can be left alone at times. Care Coordinator Anticipated Follow Up Needs: HH/OP  Clinical Impression Pleasant motivate young female who is willing to work hard to regain her independence. Her sister is here with her to provide support. Pt would benefit from seeing neuro-psych while here for coping. Will follow to assist with discharge needs and provide support.  Lucy Chris 05/20/2021, 11:30 AM

## 2021-05-21 LAB — BASIC METABOLIC PANEL
Anion gap: 6 (ref 5–15)
BUN: 12 mg/dL (ref 6–20)
CO2: 26 mmol/L (ref 22–32)
Calcium: 9.5 mg/dL (ref 8.9–10.3)
Chloride: 111 mmol/L (ref 98–111)
Creatinine, Ser: 0.9 mg/dL (ref 0.44–1.00)
GFR, Estimated: >60 mL/min (ref >60)
Glucose, Bld: 91 mg/dL (ref 70–99)
Potassium: 4.2 mmol/L (ref 3.5–5.1)
Sodium: 143 mmol/L (ref 135–145)

## 2021-05-21 MED ORDER — METOPROLOL SUCCINATE ER 25 MG PO TB24
12.5000 mg | ORAL_TABLET | Freq: Every day | ORAL | Status: DC
  Administered 2021-05-22 – 2021-05-25 (×4): 12.5 mg via ORAL
  Filled 2021-05-21 (×4): qty 1

## 2021-05-21 MED ORDER — MELATONIN 3 MG PO TABS
3.0000 mg | ORAL_TABLET | Freq: Every day | ORAL | Status: DC
  Administered 2021-05-21 – 2021-06-04 (×15): 3 mg via ORAL
  Filled 2021-05-21 (×15): qty 1

## 2021-05-21 NOTE — Progress Notes (Signed)
Physical Therapy Session Note  Patient Details  Name: Diane Todd MRN: [id number] Date of Birth: 06-Jun-2001  Today's Date: 05/21/2021 PT Individual Time: 1100-1157 PT Individual Time Calculation (min): 57 min   Short Term Goals: Week 1:  PT Short Term Goal 1 (Week 1): Pt will perform bed mobility consistently with MinA PT Short Term Goal 2 (Week 1): Pt will perform sit to stands with consistently with minA PT Short Term Goal 3 (Week 1): Pt will perform stand pivot transfers consistently with minA PT Short Term Goal 4 (Week 1): Pt will complete 4 steps using 3 inch step with ModA, BUE support on rails in order to reach LTG  Skilled Therapeutic Interventions/Progress Updates:   Received pt sitting in [facility] with sister present at bedside, pt agreeable to PT treatment, and denied any pain during session but reported 7/10 fatigue from previous OT session and from not sleeping well last night. Session with emphasis on functional mobility/transfers, generalized strengthening, dynamic standing balance/coordination, and improved activity tolerance. Pt transported to 4W dayroom in [facility] total A for time management purposes and performed seated BLE strengthening on Kinetron at 20 cm/sec for 1 minute x 3 trials with emphasis on quad/glute strengthening and core strengthening. Pt transferred sit<>sand at table in dayroom with mod A and worked on dynamic standing balance, problem solving, and fine motor control constructing pictures with pipes x 3 trials with 1 cues for correct size rod for ~6 minutes with CGA for balance. Pt reported her "legs felt weak" after standing for that long. Pt then performed BUE strengthening on UBE at level 2.5 for 2 minutes forward and 2 minutes backwards with 1 rest break halfway through. Pt transported back to room in [facility] total A and requested to return to bed due to reports of 8/10 fatigue. Pt ambulated 38ft with RW and min A to bed and transferred sit<>supine with supervision.  Concluded session with pt supine in bed, needs within reach, and bed alarm on. Provided pt with fresh ice water.   Therapy Documentation Precautions:  Precautions Precautions: Fall Precaution Comments: contact precautions, foley Restrictions Weight Bearing Restrictions: No  Therapy/Group: Individual Therapy Martin Majestic PT, DPT   05/21/2021, 7:24 AM

## 2021-05-21 NOTE — Progress Notes (Signed)
PROGRESS NOTE   Subjective/Complaints: No complaints this morning.  Denies pain Her sister reports she has not been sleeping well Urinated yesterday  ROS: Patient denies fever, rash, sore throat,  , nausea, vomiting, diarrhea, cough, shortness of breath or chest pain, joint or back pain, headache, or mood change, constipation, +insomnia   Objective:   No results found. Recent Labs    05/20/21 0505  WBC 6.2  HGB 11.3*  HCT 36.1  PLT 196   Recent Labs    05/20/21 0505 05/21/21 0709  NA 144 143  K 3.7 4.2  CL 113* 111  CO2 23 26  GLUCOSE 100* 91  BUN 11 12  CREATININE 1.07* 0.90  CALCIUM 9.1 9.5    Intake/Output Summary (Last 24 hours) at 05/21/2021 1100 Last data filed at 05/21/2021 0700 Gross per 24 hour  Intake 354 ml  Output --  Net 354 ml        Physical Exam: Vital Signs Blood pressure (!) 105/59, pulse 71, temperature 98.6 F (37 C), temperature source Oral, resp. rate 20, height 5\' 7"  (1.702 m), weight 97.8 kg, SpO2 100 %. Gen: no distress, normal appearing HEENT: oral mucosa pink and moist, NCAT Cardio: Reg rate Chest: normal effort, normal rate of breathing Abd: soft, non-distended Ext: no edema Psych: pleasant, normal affect Skin: Clean and intact without signs of breakdown Neuro: alert, oriented to person, place, mo/year. Fair insight and awareness. Blurred vision but diplopia less today, tracking better to all fields. Motor 4- to 4/5 UE's and 3-4/5 LE's. Sensory 1+/2 LE seem more affected than uppers--no change. DTR's 3+ in all 4's.  Musculoskeletal: Full ROM, No pain with AROM or PROM in the neck, trunk, or extremities. Posture appropriate  GU: voiding, PVR 0   Assessment/Plan: 1. Functional deficits which require 3+ hours per day of interdisciplinary therapy in a comprehensive inpatient rehab setting. Physiatrist is providing close team supervision and 24 hour management of active  medical problems listed below. Physiatrist and rehab team continue to assess barriers to discharge/monitor patient progress toward functional and medical goals  Care Tool:  Bathing    Body parts bathed by patient: Right arm, Left arm, Chest, Abdomen, Front perineal area, Right upper leg, Left upper leg, Face, Right lower leg, Left lower leg   Body parts bathed by helper: Buttocks     Bathing assist Assist Level: Minimal Assistance - Patient > 75%     Upper Body Dressing/Undressing Upper body dressing   What is the patient wearing?: Bra, Pull over shirt    Upper body assist Assist Level: Minimal Assistance - Patient > 75%    Lower Body Dressing/Undressing Lower body dressing      What is the patient wearing?: Pants, Incontinence brief     Lower body assist Assist for lower body dressing: Minimal Assistance - Patient > 75%     Toileting Toileting    Toileting assist Assist for toileting: Moderate Assistance - Patient 50 - 74%     Transfers Chair/bed transfer  Transfers assist  Chair/bed transfer activity did not occur: N/A  Chair/bed transfer assist level: Minimal Assistance - Patient > 75%     Locomotion Ambulation  Ambulation assist      Assist level: Minimal Assistance - Patient > 75% Assistive device: Walker-rolling Max distance: 22ft   Walk 10 feet activity   Assist     Assist level: Minimal Assistance - Patient > 75% Assistive device: Walker-rolling   Walk 50 feet activity   Assist Walk 50 feet with 2 turns activity did not occur: Safety/medical concerns  Assist level: Minimal Assistance - Patient > 75% Assistive device: Walker-rolling    Walk 150 feet activity   Assist Walk 150 feet activity did not occur: Safety/medical concerns  Assist level: Minimal Assistance - Patient > 75% Assistive device: Walker-rolling    Walk 10 feet on uneven surface  activity   Assist Walk 10 feet on uneven surfaces activity did not occur:  Safety/medical concerns         Wheelchair     Assist Will patient use wheelchair at discharge?: Yes Type of Wheelchair: Manual    Wheelchair assist level: Minimal Assistance - Patient > 75% Max wheelchair distance: 50 ft    Wheelchair 50 feet with 2 turns activity    Assist        Assist Level: Minimal Assistance - Patient > 75%   Wheelchair 150 feet activity     Assist  Wheelchair 150 feet activity did not occur: Safety/medical concerns       Blood pressure (!) 105/59, pulse 71, temperature 98.6 F (37 C), temperature source Oral, resp. rate 20, height 5\' 7"  (1.702 m), weight 97.8 kg, SpO2 100 %.  Medical Problem List and Plan: 1.  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  Patient received 5 days IV Solu-Medrol/5 treatments and plasmapheresis             -patient may shower             -ELOS/Goals: 14 days, supervision to min assist with PT and OT  -Continue CIR therapies including PT, OT, and SLP  2.  Impaired mobility: continue SCDs 3. Pain Management: N/A 4. Mood: Provide emotional support             -antipsychotic agents: N/A 5. Neuropsych: This patient is capable of making decisions on her own behalf. 6. Skin/Wound Care: Routine skin checks 7. Fluids/Electrolytes/Nutrition: Routine in and outs with follow-up chemistries 8.  Hypertension.  Decrease Toprol-XL to 12.5 mg daily.  Monitor with increased mobility. 9.  Hypothyroidism.  Continue Synthroid. 10.  H. pylori.  Treatment initiated 6/24.  Amoxicillin 1 g, Biaxin 500 mg twice daily, PPI twice daily x14 days total 11.  UTI/ESBL/E. Coli/urinary retention.  Contact precautions.  Patient completed course of fosfomycin.  Foley discontinued             -UC with no growth.              -continue trial of flomax, urecholine   -to bsc or toilet to void 12.  Obesity.  BMI 31.28.  Dietary follow-up 13.  CKD stage III.  Cr is trending upward again. Repeat tomorrow to trend. Nursing  order placed to encourage 6-8 glasses of water per day.  14.  Tobacco abuse.  Continue counseling 15. Hypokalemia: klor 7/24 supplemented, resolved.  16. Vitamin B1 deficiency: continue thiamin 100mg  daily    LOS: 4 days A FACE TO FACE EVALUATION WAS PERFORMED  Kynlea Blackston P Kenyotta Dorfman 05/21/2021, 11:00 AM

## 2021-05-21 NOTE — Progress Notes (Signed)
Physical Therapy Session Note  Patient Details  Name: Diane Todd MRN: [id number] Date of Birth: 06/10/2001  Today's Date: 05/21/2021 PT Individual Time: 7096-2836 PT Individual Time Calculation (min): 60 min   Short Term Goals: Week 1:  PT Short Term Goal 1 (Week 1): Pt will perform bed mobility consistently with MinA PT Short Term Goal 2 (Week 1): Pt will perform sit to stands with consistently with minA PT Short Term Goal 3 (Week 1): Pt will perform stand pivot transfers consistently with minA PT Short Term Goal 4 (Week 1): Pt will complete 4 steps using 3 inch step with ModA, BUE support on rails in order to reach LTG  Skilled Therapeutic Interventions/Progress Updates:    Pt received supine in bed with her sister, Vikki Ports, present and pt agreeable to therapy session. Supine>sitting R EOB, HOB flat but using bedrail, via pt self selecting logroll technique with close supervision and increased time to use UEs to assist with bringing trunk upright. L stand pivot from partially elevated EOB>w/c using RW with light mod assist to come to stand and CGA/min assist for steadying while turning. Transported to/from gym in w/c for time management and energy conservation. Sit>stands w/c>RW with light mod assist throughout session until very end when pt requires 2nd attempt and heavier mod assist to stand - pt tends to pull up with both hands on RW, cuing to scoot hips forward in seat prior to initiating stand. Gait training 169ft using RW with CGA and close w/c follow - demos slight BLE ataxic movement pattern, decreased step lengths, decreased B LE hip/knee flexion during swing, and slower more guarded gait speed and mechanics.   B LE functional strengthening using 1st 6" step and B HR support:  - started with alternate B LE foot taps  - quickly progressed to repeated LE forward/backwards stepping up/down on/off 1st 6" step x5 reps with each LE - min assist for safety with pt demoing good knee  control Pt reporting B LE fatigue at this time. Stand pivot w/c<>Nustep using RW with min assist to come to stand and CGA for steadying while turning. B LE strengthening via reciprocal movement patterns on Nustep against level 4 resistance for 30min45seconds totaling 167steps.  Transported back to room. Short distance gait ~24ft w/c>EOB using RW with CGA for steadying. Sit>supine with supervision and poor eccentric trunk control. Pt left supine in bed with needs in reach, bed alarm on, her sister present, and pt currently writing in memory notebook.   Throughout session, therapist engaged pt in recall/memory activities to state what she had accomplished during the session.  Therapy Documentation Precautions:  Precautions Precautions: Fall Precaution Comments: contact precautions, foley Restrictions Weight Bearing Restrictions: No   Pain:  No reports of pain throughout session.   Therapy/Group: Individual Therapy  Ginny Forth , PT, DPT, NCS, CSRS 05/21/2021, 7:55 AM

## 2021-05-21 NOTE — Evaluation (Signed)
Recreational Therapy Assessment and Plan  Patient Details  Name: Diane Todd MRN: [id number] Date of Birth: 02-10-2001 Today's Date: 05/21/2021  Rehab Potential:  Good ELOS:   d/c 7/20  Assessment Hospital Problem: Principal Problem:   Neuromyelitis optica (devic) ([place])     Past Medical History:      Past Medical History:  Diagnosis Date   Hypertension     Hypothyroidism      Past Surgical History:       Past Surgical History:  Procedure Laterality Date   IR FLUORO GUIDE CV LINE RIGHT   05/08/2021   IR US GUIDE VASC ACCESS RIGHT   05/08/2021      Assessment & Plan Clinical Impression: HPI: Diane Todd is a 20 year old right-handed female with history of hypertension, tobacco use, CKD stage III,  as well as thyroid disorder.  Per chart review patient lives with parent.  Independent prior to admission.  1 level home 5 steps to entry.  Prior to 4/22 was working at [employer] going to [employer].  Presented 05/03/2021 with generalized weakness poor appetite/nausea and vomiting as well as blurred vision x2 weeks.  Admission chemistries BUN 22 creatinine 1.65, sedimentation rate of 1, hemoglobin 18.8, RBC 6.71, TSH 5.224.  Initial work-up at outside [facility] showed lesions highly suggestive of NMSOD(Neuromyelitis Optica Spectrum Disorder).  MRI of the brain showed hyperintense T2 weighted signal and mild contrast-enhancement within the medial thalami and periaqueductal gray matter.  Demyelinating disease remained a primary consideration.  MRI of the orbits with and without contrast were considered.  CSF sampling also considered.  No demyelinating lesion of the cervical thoracic cord noted.  MRI of the orbits with and without contrast showed normal appearance of the orbits.  MRI cervical thoracic spine showed T7-8 small right subarticular disc protrusion without stenosis.  T8-9 small left subarticular disc protrusion without stenosis.  Renal ultrasound showed no hydronephrosis.   Neurology follow-up work-up for NMO recommended serum for AQ P4-IgG antibody pending, MOG-IgG antibody negative.  Lumbar puncture completed 05/08/2021-unremarkable no growth.  She did complete a 5-day course of high-dose steroids and 3 days of high-dose thiamine.  Started on plasmapheresis with plans of 5 total sessions last session 05/17/2021.  Ophthalmology was consulted Dr. Deanne Coffer with a funduscopic exam completed that showed papillary retinal edema appeared concerning for elevated ICP and they recommended no further intervention at this time given minimal fundus findings they did recommend a follow-up neuro-ophthalmology at a tertiary center.  Patient with history of H. pylori treatment initiated 05/10/2021 with amoxicillin/Biaxin twice daily, PPI twice daily for 14 days total.  A follow-up urine study 05/07/2021 positive for ESBL E. coli discussed with infectious disease pharmacy initially on meropenem to 1 dose of fosfomycin and completed treatment and remains on contact precautions.  Patient has had bouts of urinary retention requiring in and out catheterization with Foley catheter tube recently removed.  Therapy evaluations completed due to patient decreased functional mobility was admitted for a comprehensive rehab program. Patient transferred to CIR on 05/17/2021  Pt presents with decreased activity tolerance, decreased functional mobility, decreased balance, decreased coordination, decreased vision, decreased problem solving. decreased memory, feeling of anxiety Limiting pt's independence with leisure/community pursuits.   Plan  Min 1 TR session >20 minutes per week during LOS  Recommendations for other services: Neuropsych  Discharge Criteria: Patient will be discharged from TR if patient refuses treatment 3 consecutive times without medical reason.  If treatment goals not met, if there is a change  in medical status, if patient makes no progress towards goals or if patient is discharged from  hospital.  The above assessment, treatment plan, treatment alternatives and goals were discussed and mutually agreed upon: by patient  [place] 05/21/2021, 3:24 PM

## 2021-05-21 NOTE — Progress Notes (Signed)
Patient information reviewed and entered into eRehab System by Becky Cree Kunert, PPS coordinator. Information including medical coding, function ability, and quality indicators will be reviewed and updated through discharge.   

## 2021-05-21 NOTE — Progress Notes (Signed)
Occupational Therapy Session Note  Patient Details  Name: Diane Todd MRN: [id number] Date of Birth: [birth year]  Today's Date: 05/21/2021 OT Individual Time: 8469-6295 OT Individual Time Calculation (min): 61 min    Short Term Goals: Week 1:  OT Short Term Goal 1 (Week 1): Pt will complete STS in prep for standing ADL with min A + LRAD. OT Short Term Goal 2 (Week 1): Pt will completed toilet transfer with mod A with LRAD. OT Short Term Goal 3 (Week 1): Pt will verbalize 3 energy conservation strategies with min VCs. OT Short Term Goal 4 (Week 1): Pt will don pants with mod A + AE PRN.  Skilled Therapeutic Interventions/Progress Updates:    Treatment session with focus on self-care retraining and functional transfers.  Pt received in bed with RT present.  Nurse tech requested therapist toilet pt.  Pt completed bed mobility with CGA and increased time, requiring frequent rest breaks due to fatigue as well as needing increased time due to fearfulness with mobility.  Pt stood from EOB with min assist to RW and ambulated in to bathroom with RW to toilet.  Pt able to pull pants down prior to sitting and completed hygiene post void while seated.  Pt doffed clothing on toilet with assistance in preparation for bathing.  Pt required mod assist sit > stand from [facility] over toilet due to fatigue.  Pt completed bathing from EOB with focus on increased independence.  Pt able to wash lower legs and don pants and socks in figure 4 position with intermittent CGA for sitting balance.  Pt completed sit > stand from elevated EOB with min assist to pull pants over hips.  Discussed shower with plan to complete during session on Friday as pt feels she needs more time to process.  Pt completed stand pivot transfer bed > w/c with RW min assist due to fatigue with ambulatory transfers earlier in session. Pt completed oral care seated at sink.  Pt remained upright in w/c with chair alarm on and all needs in reach.     Therapy Documentation Precautions:  Precautions Precautions: Fall Precaution Comments: contact precautions, foley Restrictions Weight Bearing Restrictions: No  Pain:  Pt with no c/o pain   Therapy/Group: Individual Therapy  Rosalio Loud 05/21/2021, 10:44 AM

## 2021-05-22 NOTE — Progress Notes (Signed)
Physical Therapy Session Note  Patient Details  Name: Diane Todd MRN: [id number] Date of Birth: [birth year]  Today's Date: 05/22/2021 PT Individual Time: 8242-3536 PT Individual Time Calculation (min): 53 min   Short Term Goals: Week 1:  PT Short Term Goal 1 (Week 1): Pt will perform bed mobility consistently with MinA PT Short Term Goal 2 (Week 1): Pt will perform sit to stands with consistently with minA PT Short Term Goal 3 (Week 1): Pt will perform stand pivot transfers consistently with minA PT Short Term Goal 4 (Week 1): Pt will complete 4 steps using 3 inch step with ModA, BUE support on rails in order to reach LTG  Skilled Therapeutic Interventions/Progress Updates:   Received pt sitting in [facility] with sister present at bedside, pt agreeable to PT treatment, and denied any pain during session but continues to report fatigue and unable to recall how much sleep she got last night. Session with emphasis on functional mobility/transfers, generalized strengthening, dynamic standing balance/coordination, gait training, community navigation, and improved activity tolerance. Pt transported outside to entrance of WCC in WC total A for time management purposes. Sit<>stands with RW and light mod A x 3 trials throughout session with cues to scoot to edge of chair and for 1 UE placement on WC armrest, as pt with tendency to pull up with both UEs on RW. Pt ambulated 43ft x 2 trials with RW and min A around reflection fountain on uneven concrete and up/down mild slopes. Pt demonstrated generalized unsteadiness, mild LE ataxia, and decreased coordination but no true LOB noted. Pt performed WC mobility 173ft x 1 and 41ft x 1 using BUE and supervision/CGA over concrete and mild slopes with emphasis on endurance and UE strengthening. Showed pt various sculptures outside to improve spirits and pt very appreciative of going outside today. Pt then ambulated additional 23ft with RW and min A through giftshop with  emphasis on community navigation, visual scanning, tight turns, and dual tasking. Pt able to reach outside BOS to pick up various objects on different shelf heights with no LOB and CGA for balance. Discussed energy conservation techniques and recognition of signs/symptoms of fatigue and importance of sitting to rest when fatigued. Pt transported back to room in [facility] total A. Concluded session with pt sitting in WC, needs within reach, and chair pad alarm on. Encouraged pt to write events from session in memory notebook.   Reached out to MD regarding getting grounds pass for pt and sister.   Therapy Documentation Precautions:  Precautions Precautions: Fall Precaution Comments: contact precautions, foley Restrictions Weight Bearing Restrictions: No  Therapy/Group: Individual Therapy Martin Majestic PT, DPT   05/22/2021, 7:20 AM

## 2021-05-22 NOTE — Patient Care Conference (Signed)
Inpatient RehabilitationTeam Conference and Plan of Care Update Date: 05/21/2021   Time: 1:52 PM    Patient Name: Diane Todd      Medical Record Number: [id number]  Date of Birth: 02-05-2001 Sex: Female         Room/Bed: 5C07C/5C07C-01 Payor Info: Payor: [place] MEDICAID PREPAID HEALTH PLAN / Plan: [place] MEDICAID [facility] / Product Type: *No Product type* /    Admit Date/Time:  05/17/2021  4:54 PM  Primary Diagnosis:  Neuromyelitis optica (devic) [facility])  Hospital Problems: Principal Problem:   Neuromyelitis optica (devic) [facility])    Expected Discharge Date: Expected Discharge Date: 06/05/21  Team Members Present: Physician leading conference: Dr. Sula Soda Care Coodinator Present: Kennyth Arnold, RN, BSN, CRRN;Becky Dupree, LCSW Nurse Present: Kennyth Arnold, RN PT Present: Raechel Chute, PT OT Present: Rosalio Loud, OT SLP Present: Other (comment) Sonny Masters, SLP) PPS Coordinator present : Fae Pippin, SLP     Current Status/Progress Goal Weekly Team Focus  Bowel/Bladder   Continent of B/B LBM 05/20/2021  Continue continence  Assess B/B every shift and PRN   Swallow/Nutrition/ Hydration             ADL's   Min-mod assist bathing, Min assist UB dressing, Min-mod assist LB dressing with use of figure 4, completing sit > stand min assist with RW, squat pivot min-mod assist.  pt very anxious with all mobility, but does quite well!  Min A bathing and LB dressing, CGA transfers, toileting, sit > stand  ADL retraining, transfers, activity tolerance/endurance, generalized strengthening, emotional support   Mobility   bed mobility mod/max A, transfers with RW mod A, gait 75ft with RW mod A  supervision, CGA stairs  functional mobility/transfers, generalized strengthening, dynamic standing balance/coordination, gait training, NMR, discharge planning, and improved activity tolerance.   Communication             Safety/Cognition/ Behavioral Observations  Mod A   Supervision A recall, Mod I  mildly complex problem solving, error awareness and short term recall strategies   Pain   Denies pain  Pain level <3/10  Assess pain every shift and PRN   Skin   Abrasions to bilateral buttocks  No new breakdown  Assess skin every shift and PRN     Discharge Planning:  HOme with parents and sister to assist-Mom taking FMLA to be with for a short time then will have other family members with her   Team Discussion: Can have a grounds pass, urinary retention has improved. Continent B/B, no pain reported.  Patient on target to meet rehab goals: yes, started on timed toileting schedule, go to bathroom after every meal. She is fearful with all mobility. Min assist with all ADL's. Contact guard/min assist goals. Supervision bed mobility, mod assist transfers with RW. Walked > 150 ft +2 W/C follow. Need to try stairs. SLP reports mild/mod cognitive deficits. Supervision with recall, short term recall, and problem solving.  *See Care Plan and progress notes for long and short-term goals.   Revisions to Treatment Plan:  Decreasing Metoprolol.  Teaching Needs: Family education, medication management, bowel/bladder management, BP management, transfer training, gait training, balance training, endurance training, stair training, safety awareness.  Current Barriers to Discharge: Decreased caregiver support, Medical stability, Home enviroment access/layout, Neurogenic bowel and bladder, Lack of/limited family support, Weight, Medication compliance, and Behavior  Possible Resolutions to Barriers: Continue current medications, provide emotional support.     Medical Summary Current Status: insomnia, thiamine deficiency, urinary retention, constipation, hypertension  Barriers to Discharge: Medical stability  Barriers to Discharge Comments: insomnia, thiamine deficiency, urinary retention, constipation, hypertension Possible Resolutions to [employer] Focus: start  melatonin HS, therapeutic grounds pass, continue urecholine/timed voiding, continue miralax, decrease toprol   Continued Need for Acute Rehabilitation Level of Care: The patient requires daily medical management by a physician with specialized training in physical medicine and rehabilitation for the following reasons: Direction of a multidisciplinary physical rehabilitation program to maximize functional independence : Yes Medical management of patient stability for increased activity during participation in an intensive rehabilitation regime.: Yes Analysis of laboratory values and/or radiology reports with any subsequent need for medication adjustment and/or medical intervention. : Yes   I attest that I was present, lead the team conference, and concur with the assessment and plan of the team.   Tennis Must 05/22/2021, 11:34 AM

## 2021-05-22 NOTE — Progress Notes (Signed)
Occupational Therapy Session Note  Patient Details  Name: Diane Todd MRN: [id number] Date of Birth: 2001-07-12  Today's Date: 05/22/2021 OT Missed Time: 60 Minutes Missed Time Reason: Patient unwilling/refused to participate without medical reason   Pt politely declined group OT session, will follow up as able to make up missed minutes.    Therapy/Group: Group Therapy  Barron Schmid 05/22/2021, 3:59 PM

## 2021-05-22 NOTE — Progress Notes (Signed)
PROGRESS NOTE   Subjective/Complaints: +anxiety and insomnia as per sister. Agreeable to trying amitriptyline 10mg  HS  ROS: Patient denies fever, rash, sore throat,  , nausea, vomiting, diarrhea, cough, shortness of breath or chest pain, joint or back pain, headache, or mood change, constipation, +insomnia, +anxiety   Objective:   No results found. Recent Labs    05/20/21 0505  WBC 6.2  HGB 11.3*  HCT 36.1  PLT 196   Recent Labs    05/20/21 0505 05/21/21 0709  NA 144 143  K 3.7 4.2  CL 113* 111  CO2 23 26  GLUCOSE 100* 91  BUN 11 12  CREATININE 1.07* 0.90  CALCIUM 9.1 9.5    Intake/Output Summary (Last 24 hours) at 05/22/2021 1303 Last data filed at 05/22/2021 0745 Gross per 24 hour  Intake 297 ml  Output --  Net 297 ml        Physical Exam: Vital Signs Blood pressure 123/83, pulse (!) 107, temperature 98.7 F (37.1 C), temperature source Oral, resp. rate 18, height 5\' 7"  (1.702 m), weight 100.9 kg, SpO2 97 %. Gen: no distress, normal appearing HEENT: oral mucosa pink and moist, NCAT Cardio: Tachycardia Chest: normal effort, normal rate of breathing Abd: soft, non-distended Ext: no edema Psych: pleasant, normal affect Skin: Clean and intact without signs of breakdown Neuro: alert, oriented to person, place, mo/year. Fair insight and awareness. Blurred vision but diplopia less today, tracking better to all fields. Motor 4- to 4/5 UE's and 3-4/5 LE's. Sensory 1+/2 LE seem more affected than uppers--no change. DTR's 3+ in all 4's.  Musculoskeletal: Full ROM, No pain with AROM or PROM in the neck, trunk, or extremities. Posture appropriate  GU: voiding, PVR 0   Assessment/Plan: 1. Functional deficits which require 3+ hours per day of interdisciplinary therapy in a comprehensive inpatient rehab setting. Physiatrist is providing close team supervision and 24 hour management of active medical problems listed  below. Physiatrist and rehab team continue to assess barriers to discharge/monitor patient progress toward functional and medical goals  Care Tool:  Bathing    Body parts bathed by patient: Right arm, Left arm, Chest, Abdomen, Front perineal area, Right upper leg, Left upper leg, Face, Right lower leg, Left lower leg   Body parts bathed by helper: Buttocks     Bathing assist Assist Level: Minimal Assistance - Patient > 75%     Upper Body Dressing/Undressing Upper body dressing   What is the patient wearing?: Bra, Pull over shirt    Upper body assist Assist Level: Minimal Assistance - Patient > 75%    Lower Body Dressing/Undressing Lower body dressing      What is the patient wearing?: Pants, Incontinence brief     Lower body assist Assist for lower body dressing: Minimal Assistance - Patient > 75%     Toileting Toileting    Toileting assist Assist for toileting: Moderate Assistance - Patient 50 - 74%     Transfers Chair/bed transfer  Transfers assist  Chair/bed transfer activity did not occur: N/A  Chair/bed transfer assist level: Minimal Assistance - Patient > 75% Chair/bed transfer assistive device: 07/23/2021   Ambulation assist  Assist level: Contact Guard/Touching assist Assistive device: Walker-rolling Max distance: 38ft   Walk 10 feet activity   Assist     Assist level: Contact Guard/Touching assist Assistive device: Walker-rolling   Walk 50 feet activity   Assist Walk 50 feet with 2 turns activity did not occur: Safety/medical concerns  Assist level: Contact Guard/Touching assist Assistive device: Walker-rolling    Walk 150 feet activity   Assist Walk 150 feet activity did not occur: Safety/medical concerns  Assist level: Contact Guard/Touching assist Assistive device: Walker-rolling    Walk 10 feet on uneven surface  activity   Assist Walk 10 feet on uneven surfaces activity did not occur:  Safety/medical concerns         Wheelchair     Assist Will patient use wheelchair at discharge?: Yes Type of Wheelchair: Manual    Wheelchair assist level: Contact Guard/Touching assist Max wheelchair distance: 161ft    Wheelchair 50 feet with 2 turns activity    Assist        Assist Level: Contact Guard/Touching assist   Wheelchair 150 feet activity     Assist      Assist Level: Maximal Assistance - Patient 25 - 49% (pt propels 50 ft (33%))   Blood pressure 123/83, pulse (!) 107, temperature 98.7 F (37.1 C), temperature source Oral, resp. rate 18, height 5\' 7"  (1.702 m), weight 100.9 kg, SpO2 97 %.  Medical Problem List and Plan: 1.  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  Patient received 5 days IV Solu-Medrol/5 treatments and plasmapheresis             -patient may shower             -ELOS/Goals: 14 days, supervision to min assist with PT and OT  -Continue CIR therapies including PT, OT, and SLP  2.  Impaired mobility: continue SCDs 3. Pain Management: N/A 4. Anxiety: start amitriptyline 10mg  HS             -antipsychotic agents: N/A 5. Neuropsych: This patient is capable of making decisions on her own behalf. 6. Skin/Wound Care: Routine skin checks 7. Fluids/Electrolytes/Nutrition: Routine in and outs with follow-up chemistries 8.  Hypertension.  Decrease Toprol-XL to 12.5 mg daily.  Monitor with increased mobility. 9.  Hypothyroidism.  Continue Synthroid. 10.  H. pylori.  Treatment initiated 6/24.  Amoxicillin 1 g, Biaxin 500 mg twice daily, PPI twice daily x14 days total 11.  UTI/ESBL/E. Coli/urinary retention.  Contact precautions.  Patient completed course of fosfomycin.  Foley discontinued             -UC with no growth.              -continue trial of flomax, urecholine   -to bsc or toilet to void 12.  Obesity.  BMI 31.28.  Dietary follow-up 13.  CKD stage III.  Cr is trending upward again. Repeat tomorrow to trend.  Nursing order placed to encourage 6-8 glasses of water per day.  14.  Tobacco abuse.  Continue counseling 15. Hypokalemia: klor supplemented, resolved.  16. Vitamin B1 deficiency: continue thiamin 100mg  daily 17. Insomnia: start amitriptyline 10mg  HS    LOS: 5 days A FACE TO FACE EVALUATION WAS PERFORMED  Daxter Paule P Deem Marmol 05/22/2021, 1:03 PM

## 2021-05-22 NOTE — Progress Notes (Signed)
Recreational Therapy Session Note  Patient Details  Name: Diane Todd MRN: [id number] Date of Birth: 11/09/2001 Today's Date: 05/22/2021  Pain: no c/o Skilled Therapeutic Interventions/Progress Updates: Pt scheduled for 60 minute Therapeutic Activities Group but declined due to "social anxiety."  Pt states she had social anxiety PTA and limited social interaction even with her friends.  Explained purpose of the group and pt stated she was scared and felt nervous.  Emotional support provided by LRT and pts sister.  Pt missed 60 minute group. Diane Todd 05/22/2021, 3:44 PM

## 2021-05-22 NOTE — Progress Notes (Signed)
Speech Language Pathology Daily Session Note  Patient Details  Name: Diane Todd MRN: [id number] Date of Birth: 2000-11-27  Today's Date: 05/22/2021 SLP Individual Time: 1430-1500 SLP Individual Time Calculation (min): 30 min  Short Term Goals: Week 1: SLP Short Term Goal 1 (Week 1): Pt will demonstrate problem solving skills in mildly complex tasks with min A verbal cues. SLP Short Term Goal 2 (Week 1): Pt will self-monitor and self-correct functional errors with min A verbal cues. SLP Short Term Goal 3 (Week 1): Pt will demonstrate recall of novel, daily events with mod A verbal cues. SLP Short Term Goal 4 (Week 1): Pt will demonstrate recall of novel, daily events with use of written aid given supervision A verbal cues.  Skilled Therapeutic Interventions:   Patient seen with sister present in room for skilled ST session. Patient reported that she had not finished writing in her memory notebook and so SLP suggested we work on that. Patient agreeable and demonstrated improved recall when SLP providing minA semantic cues. Patient did seem unsure of herself at times, asking her sister if her handwriting looked okay and that she "hoped" she was doing well in therapy. SLP encouraged patient to continue writing in notebook for memory but also to track progress. When patient asked to write a general statement about today, she wrote that she was "surprised at how well I did". Patient continues to benefit from skilled SLP intervention to maximize cognitive function prior to discharge.  Pain Pain Assessment Pain Scale: 0-10 Pain Score: 0-No pain  Therapy/Group: Individual Therapy  Angela Nevin, MA, CCC-SLP Speech Therapy

## 2021-05-22 NOTE — Progress Notes (Signed)
Occupational Therapy Note  Patient Details  Name: Diane Todd MRN: [id number] Date of Birth: 2001-06-22   Attempted to make up missed time, however SLP arriving for scheduled therapy session a couple minutes after OT arrived.  Therefore therapist left pt upright with SLP.   Rosalio Loud 05/22/2021, 2:50 PM

## 2021-05-22 NOTE — Progress Notes (Signed)
Occupational Therapy Session Note  Patient Details  Name: Diane Todd MRN: [id number] Date of Birth: 2001/09/26  Today's Date: 05/22/2021 OT Individual Time: 6269-4854 OT Individual Time Calculation (min): 60 min    Short Term Goals: Week 1:  OT Short Term Goal 1 (Week 1): Pt will complete STS in prep for standing ADL with min A + LRAD. OT Short Term Goal 2 (Week 1): Pt will completed toilet transfer with mod A with LRAD. OT Short Term Goal 3 (Week 1): Pt will verbalize 3 energy conservation strategies with min VCs. OT Short Term Goal 4 (Week 1): Pt will don pants with mod A + AE PRN.  Skilled Therapeutic Interventions/Progress Updates:    Treatment session with focus on self-care retraining, functional transfers, and dynamic standing balance during self-care tasks.  Pt received upright in bed finishing breakfast.  Pt stated not having toileted since last night, agreeable to attempting to toilet.  Pt completed bed mobility supervision and ambulated to toilet with min assist.  Pt able to adjust clothing prior to toileting with min assist for standing balance.  Pt voided urine on toilet.  Pt then completed perineal hygiene seated on toilet.  Pt required mod assist sit > stand from toilet despite attempts x2.  Pt returned to EOB with RW min assist.  Pt completed bathing with setup of items, encouraging pt to ring out wash cloth for further focus on grip strength and motor control.  Pt required assistance to pull bra over back and min assist when standing to pull pants over hips. Pt completed stand pivot transfer to w/c min assist with RW.  Pt completed oral care in standing with CGA for standing balance.  Therapist provided pt with yellow theraputty and educated on grip and pinch activities to focus on BUE strengthening and motor control.  Pt remained upright in w/c with chair alarm on and all needs in reach.  Therapy Documentation Precautions:  Precautions Precautions: Fall Precaution  Comments: contact precautions, foley Restrictions Weight Bearing Restrictions: No General:   Vital Signs: Therapy Vitals Temp: 98.7 F (37.1 C) Temp Source: Oral Pulse Rate: (!) 103 Resp: 18 BP: 107/65 Patient Position (if appropriate): Lying Oxygen Therapy SpO2: 97 % O2 Device: Room Air Pain:  Pt with no c/o pain    Therapy/Group: Individual Therapy  Rosalio Loud 05/22/2021, 8:51 AM

## 2021-05-22 NOTE — Plan of Care (Signed)
  Problem: Consults Goal: RH GENERAL PATIENT EDUCATION Description: See Patient Education module for education specifics. Outcome: Progressing Goal: Nutrition Consult-if indicated Outcome: Progressing   Problem: RH BOWEL ELIMINATION Goal: RH STG MANAGE BOWEL WITH ASSISTANCE Description: STG Manage Bowel with Min Assistance. Outcome: Progressing   Problem: RH BLADDER ELIMINATION Goal: RH STG MANAGE BLADDER WITH ASSISTANCE Description: STG Manage Bladder With Min Assistance Outcome: Progressing   Problem: RH SKIN INTEGRITY Goal: RH STG MAINTAIN SKIN INTEGRITY WITH ASSISTANCE Description: STG Maintain Skin Integrity With Min Assistance. Outcome: Progressing   Problem: RH SAFETY Goal: RH STG ADHERE TO SAFETY PRECAUTIONS W/ASSISTANCE/DEVICE Description: STG Adhere to Safety Precautions With Supervision Assistance/Device. Outcome: Progressing   Problem: RH KNOWLEDGE DEFICIT GENERAL Goal: RH STG INCREASE KNOWLEDGE OF SELF CARE AFTER HOSPITALIZATION Description: Patient will demonstrate knowledge of medication management, safety precautions, and HTN management with educational materials and handouts provided by staff independently at discharge. Outcome: Progressing   

## 2021-05-22 NOTE — Progress Notes (Signed)
Patient ID: Diane Todd, female   DOB: 2000-12-25, 20 y.o.   MRN: [id number]  Met with pt and sister yesterday to update regarding team conference goals of supervision-min level and target discharge 7/19. Pt is doing well and needs encouragement in her therapies, but still gives max effort. Sister to let [name] know of date so can plan time off. Will continue to work on discharge needs.

## 2021-05-23 MED ORDER — AMITRIPTYLINE HCL 10 MG PO TABS
10.0000 mg | ORAL_TABLET | Freq: Every day | ORAL | Status: DC
  Administered 2021-05-23 – 2021-05-27 (×5): 10 mg via ORAL
  Filled 2021-05-23 (×7): qty 1

## 2021-05-23 NOTE — Progress Notes (Signed)
Occupational Therapy Session Note  Patient Details  Name: Diane Todd MRN: [id number] Date of Birth: [birth year]  Today's Date: 05/23/2021 OT Individual Time: 0802-0901 OT Individual Time Calculation (min): 59 min    Short Term Goals: Week 1:  OT Short Term Goal 1 (Week 1): Pt will complete STS in prep for standing ADL with min A + LRAD. OT Short Term Goal 2 (Week 1): Pt will completed toilet transfer with mod A with LRAD. OT Short Term Goal 3 (Week 1): Pt will verbalize 3 energy conservation strategies with min VCs. OT Short Term Goal 4 (Week 1): Pt will don pants with mod A + AE PRN.  Skilled Therapeutic Interventions/Progress Updates:    Pt received semi-reclined in bed with sister present, denies pain, agreeable to therapy. Session focus on self-care retraining +  BUE coordination in prep for improved ADL performance. Came to sitting EOB with close S and use of bed rail with increased time. STS from elevated surface with RW with CGA. Amb to toilet with light min A for balance. CGA for LB clothing management. Continent void of b/b. Distant S for seated pericare and mod A to pull pants back up over hips. Mod A for STS from lower BSC surface. Amb to sink to wash hands with min A for ambulation, Close S for standing oral and hand care. Seated in w/c, doffed pants via lateral leans with light min A to remove pants from L hip. Min A to don pants for STS and to pull over R hip. Completed UBD with set-up A. W/c transport total A to and from gym 2/2 time management and energy conservation. Pt electing to remain seated at BITS to complete the following games:  Game:Single Target Accuracy: 91% Reaction Time: 1.10 Duration Time: 3 min Hits:  163 , pt noted to rely on use of second digit to tap targets vs isolation of 1st digit Game: Memory Accuracy: 72.73%, pt noted to demonstrate difficulty with recalling sequences of up to 7 words.    Pt left seated in w/c with chair alarm engaged, sister  present, call bell in reach, and all immediate needs met.    Therapy Documentation Precautions:  Precautions Precautions: Fall Precaution Comments: contact precautions Restrictions Weight Bearing Restrictions: No  Pain: denies   ADL: See Care Tool for more details.   Therapy/Group: Individual Therapy  Volanda Napoleon MS, OTR/L  05/23/2021, 6:47 AM

## 2021-05-23 NOTE — Progress Notes (Signed)
Speech Language Pathology Daily Session Note  Patient Details  Name: Diane Todd MRN: [id number] Date of Birth: Dec 19, 2000  Today's Date: 05/23/2021 SLP Individual Time: 0930-1015 SLP Individual Time Calculation (min): 45 min  Short Term Goals: Week 1: SLP Short Term Goal 1 (Week 1): Pt will demonstrate problem solving skills in mildly complex tasks with min A verbal cues. SLP Short Term Goal 2 (Week 1): Pt will self-monitor and self-correct functional errors with min A verbal cues. SLP Short Term Goal 3 (Week 1): Pt will demonstrate recall of novel, daily events with mod A verbal cues. SLP Short Term Goal 4 (Week 1): Pt will demonstrate recall of novel, daily events with use of written aid given supervision A verbal cues.  Skilled Therapeutic Interventions: Skilled ST intervention performed with focus on cognitive skills. Patient was accompanied by her sister this date. Patient agreeable to write in memory notebook to summarize what she did in previous session, as well as what she discussed with MD when they made rounds this morning. SLP provided min A semantic cues for recall of what to write and encouragement as she seemed unsure of herself at times. SLP suggested writing notes/reminders in list format (bullet points), as she reported writing is difficult and she tires easily and loses focus. She stated writing in list format was helpful. Facilitated remainder of session with mildly complex verbal reasoning task involving generating words within patient preferred categories (restaurants, stores, food) with selected initial letter (e.g., restaurants that begin with the letter A). Patient completed with min A sematic cues for processing and alternating between letters and categories. Patient was left in wheelchair with alarm activated and needs within reach at end of session. Continue per ST POC.     Pain Pain Assessment Pain Scale: 0-10 Pain Score: 2  Pain Type: Acute pain Pain  Location: Head Pain Descriptors / Indicators: Headache Pain Onset: On-going Pain Intervention(s): MD notified (Comment);Other (Comment) (MD was present in room when patient reported headache. Patient stated headache was manageable and did not want/need to request for pain medication during session)  Therapy/Group: Individual Therapy  Tamala Ser 05/23/2021, 12:32 PM

## 2021-05-23 NOTE — Progress Notes (Signed)
Physical Therapy Session Note  Patient Details  Name: Diane Todd MRN: [id number] Date of Birth: June 18, 2001  Today's Date: 05/23/2021 PT Individual Time: 1300-1356 PT Individual Time Calculation (min): 56 min   Short Term Goals: Week 1:  PT Short Term Goal 1 (Week 1): Pt will perform bed mobility consistently with MinA PT Short Term Goal 2 (Week 1): Pt will perform sit to stands with consistently with minA PT Short Term Goal 3 (Week 1): Pt will perform stand pivot transfers consistently with minA PT Short Term Goal 4 (Week 1): Pt will complete 4 steps using 3 inch step with ModA, BUE support on rails in order to reach LTG  Skilled Therapeutic Interventions/Progress Updates:   Received pt semi-reclined in bed with sister present at bedside, pt agreeable to PT treatment, and denied any pain during session but reported having a headache earlier today. Session with emphasis on functional mobility/transfers, generalized strengthening, dynamic standing balance/coordination, gait training, stair navigation, and improved activity tolerance. Pt transferred semi-reclined<>sitting EOB with supervision with HOB elevated and use of bedrails and transferred stand<>pivot bed<>WC with RW and min A. Donned socks and shoes with total A for time management purposes and transported pt to 4W therapy gym in [facility] total A for time management and energy conservation purposes. Pt navigated 4 steps with 2 rails and min/mod A ascending and descending with a step to pattern. Noted pt with poor eccentric control and increased weakness when descending with LLE; therefore cued pt to descend with RLE. Pt required x3 attempts and mod A to stand from [employer] and ambulated 127ft with RW and min A with 1 standing rest break. Pt demonstrated decreased cadence, mild ataxia, and generalized unsteadiness with decreased coordination but no LOB noted. Pt then performed BUE/LE strengthening on Nustep at workload 3 for 8 minutes for a total of  337 steps for improved cardiovascular endurance and reciprocal movement training. Stand<>pivot Nustep<>WC with RW and min A and transported back to room in [facility] total A. Pt requested to return to bed and transferred WC<>bed stand<>pivot with RW and min A and sit<>supine with supervision. Concluded session with pt semi-reclined in bed, needs within reach, and bed alarm on.   Challenged pt with memory, recalling main 3 events that occurred during session. Pt able to recall "stairs, walking, and bike" and was writing in memory notebook upon therapist's exit.   Therapy Documentation Precautions:  Precautions Precautions: Fall Precaution Comments: contact precautions Restrictions Weight Bearing Restrictions: No  Therapy/Group: Individual Therapy Martin Majestic PT, DPT   05/23/2021, 7:15 AM

## 2021-05-23 NOTE — Progress Notes (Addendum)
Mouth guard ordered; per secretary they don't have it with materials and will ask Speech therapy. Ortho tech paged as well but  they  said that they don't do anything with the mouth.

## 2021-05-23 NOTE — Progress Notes (Signed)
PROGRESS NOTE   Subjective/Complaints: +anxiety and insomnia C/o pain at bilateral temples- tried to order mouth guard for possible bruxism but do not see any order like this. Hopefully amitriptyline will help patient sleep better and minimize mouth grinding  ROS: Patient denies fever, rash, sore throat,  , nausea, vomiting, diarrhea, cough, shortness of breath or chest pain, joint or back pain, headache, or mood change, constipation, +insomnia, +anxiety, +pain at bilateral temples   Objective:   No results found. No results for input(s): WBC, HGB, HCT, PLT in the last 72 hours.  Recent Labs    05/21/21 0709  NA 143  K 4.2  CL 111  CO2 26  GLUCOSE 91  BUN 12  CREATININE 0.90  CALCIUM 9.5    Intake/Output Summary (Last 24 hours) at 05/23/2021 1224 Last data filed at 05/23/2021 0900 Gross per 24 hour  Intake 356 ml  Output --  Net 356 ml        Physical Exam: Vital Signs Blood pressure 117/68, pulse 89, temperature 98.5 F (36.9 C), resp. rate 18, height 5\' 7"  (1.702 m), weight 100.9 kg, SpO2 100 %. Gen: no distress, normal appearing HEENT: oral mucosa pink and moist, NCAT Cardio: Tachycardia Chest: normal effort, normal rate of breathing Abd: soft, non-distended Ext: no edema Psych: pleasant, normal affect Skin: Clean and intact without signs of breakdown Neuro: alert, oriented to person, place, mo/year. Fair insight and awareness. Blurred vision but diplopia less today, tracking better to all fields. Motor 4- to 4/5 UE's and 3-4/5 LE's. Sensory 1+/2 LE seem more affected than uppers--no change. DTR's 3+ in all 4's.  Musculoskeletal: Full ROM, No pain with AROM or PROM in the neck, trunk, or extremities. Posture appropriate. Tenderness to palpation bilateral temples GU: voiding, PVR 0.   Assessment/Plan: 1. Functional deficits which require 3+ hours per day of interdisciplinary therapy in a comprehensive  inpatient rehab setting. Physiatrist is providing close team supervision and 24 hour management of active medical problems listed below. Physiatrist and rehab team continue to assess barriers to discharge/monitor patient progress toward functional and medical goals  Care Tool:  Bathing    Body parts bathed by patient: Right arm, Left arm, Chest, Abdomen, Front perineal area, Right upper leg, Left upper leg, Face, Right lower leg, Left lower leg   Body parts bathed by helper: Buttocks     Bathing assist Assist Level: Minimal Assistance - Patient > 75%     Upper Body Dressing/Undressing Upper body dressing   What is the patient wearing?: Pull over shirt    Upper body assist Assist Level: Set up assist    Lower Body Dressing/Undressing Lower body dressing      What is the patient wearing?: Pants     Lower body assist Assist for lower body dressing: Minimal Assistance - Patient > 75%     Toileting Toileting    Toileting assist Assist for toileting: Moderate Assistance - Patient 50 - 74%     Transfers Chair/bed transfer  Transfers assist  Chair/bed transfer activity did not occur: N/A  Chair/bed transfer assist level: Minimal Assistance - Patient > 75% Chair/bed transfer assistive device:  Ambulation assist      Assist level: Contact Guard/Touching assist Assistive device: Walker-rolling Max distance: 24ft   Walk 10 feet activity   Assist     Assist level: Contact Guard/Touching assist Assistive device: Walker-rolling   Walk 50 feet activity   Assist Walk 50 feet with 2 turns activity did not occur: Safety/medical concerns  Assist level: Contact Guard/Touching assist Assistive device: Walker-rolling    Walk 150 feet activity   Assist Walk 150 feet activity did not occur: Safety/medical concerns  Assist level: Contact Guard/Touching assist Assistive device: Walker-rolling    Walk 10 feet on uneven surface   activity   Assist Walk 10 feet on uneven surfaces activity did not occur: Safety/medical concerns         Wheelchair     Assist Will patient use wheelchair at discharge?: Yes Type of Wheelchair: Manual    Wheelchair assist level: Contact Guard/Touching assist Max wheelchair distance: 128ft    Wheelchair 50 feet with 2 turns activity    Assist        Assist Level: Contact Guard/Touching assist   Wheelchair 150 feet activity     Assist      Assist Level: Maximal Assistance - Patient 25 - 49% (pt propels 50 ft (33%))   Blood pressure 117/68, pulse 89, temperature 98.5 F (36.9 C), resp. rate 18, height 5\' 7"  (1.702 m), weight 100.9 kg, SpO2 100 %.  Medical Problem List and Plan: 1.  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  Patient received 5 days IV Solu-Medrol/5 treatments and plasmapheresis             -patient may shower             -ELOS/Goals: 14 days, supervision to min assist with PT and OT  -Continue CIR therapies including PT, OT, and SLP  2.  Impaired mobility: continue SCDs 3. Pain at bilateral temples: may be secondary to bruxism- asked nursing if they can get mouth guard for patient. Hopefully amitriptyline will allow her to sleep better and grind teeth less 4. Anxiety: start amitriptyline 10mg  HS             -antipsychotic agents: N/A 5. Neuropsych: This patient is capable of making decisions on her own behalf. 6. Skin/Wound Care: Routine skin checks 7. Fluids/Electrolytes/Nutrition: Routine in and outs with follow-up chemistries 8.  Hypertension.  Decrease Toprol-XL to 12.5 mg daily.  Monitor with increased mobility. 9.  Hypothyroidism.  Continue Synthroid. 10.  H. pylori.  Treatment initiated 6/24.  Amoxicillin 1 g, Biaxin 500 mg twice daily, PPI twice daily x14 days total 11.  UTI/ESBL/E. Coli/urinary retention.  Contact precautions.  Patient completed course of fosfomycin.  Foley discontinued             -UC with no  growth.              -continue trial of flomax, urecholine   -to bsc or toilet to void 12.  Obesity.  BMI 31.28.  Dietary follow-up 13.  CKD stage III.  Cr is trending upward again. Repeat tomorrow to trend. Nursing order placed to encourage 6-8 glasses of water per day.  14.  Tobacco abuse.  Continue counseling 15. Hypokalemia: klor supplemented, resolved.  16. Vitamin B1 deficiency: continue thiamin 100mg  daily 17. Insomnia: start amitriptyline 10mg  HS     LOS: 6 days A FACE TO FACE EVALUATION WAS PERFORMED  7/24 Mazen Marcin 05/23/2021, 12:24 PM

## 2021-05-24 NOTE — Progress Notes (Signed)
PROGRESS NOTE   Subjective/Complaints: Too anxious to participate in group therapy Making good progress with individualized therapy Slept better last night  ROS: Patient denies fever, rash, sore throat,  , nausea, vomiting, diarrhea, cough, shortness of breath or chest pain, joint or back pain, headache, or mood change, constipation, +insomnia-improving, +anxiety, +pain at bilateral temples   Objective:   No results found. No results for input(s): WBC, HGB, HCT, PLT in the last 72 hours.  No results for input(s): NA, K, CL, CO2, GLUCOSE, BUN, CREATININE, CALCIUM in the last 72 hours.   Intake/Output Summary (Last 24 hours) at 05/24/2021 1123 Last data filed at 05/24/2021 0700 Gross per 24 hour  Intake 238 ml  Output --  Net 238 ml        Physical Exam: Vital Signs Blood pressure 111/67, pulse 92, temperature 98.4 F (36.9 C), temperature source Oral, resp. rate 17, height 5\' 7"  (1.702 m), weight 97.6 kg, SpO2 97 %. Gen: no distress, normal appearing HEENT: oral mucosa pink and moist, NCAT Cardio: Reg rate Chest: normal effort, normal rate of breathing Abd: soft, non-distended Ext: no edema Psych: pleasant, normal affect Skin: Clean and intact without signs of breakdown Neuro: alert, oriented to person, place, mo/year. Fair insight and awareness. Blurred vision but diplopia less today, tracking better to all fields. Motor 4- to 4/5 UE's and 3-4/5 LE's. Sensory 1+/2 LE seem more affected than uppers--no change. DTR's 3+ in all 4's.  Musculoskeletal: Full ROM, No pain with AROM or PROM in the neck, trunk, or extremities. Posture appropriate. Tenderness to palpation bilateral temples GU: voiding, PVR 0.   Assessment/Plan: 1. Functional deficits which require 3+ hours per day of interdisciplinary therapy in a comprehensive inpatient rehab setting. Physiatrist is providing close team supervision and 24 hour management of  active medical problems listed below. Physiatrist and rehab team continue to assess barriers to discharge/monitor patient progress toward functional and medical goals  Care Tool:  Bathing    Body parts bathed by patient: Right arm, Left arm, Chest, Abdomen, Front perineal area, Right upper leg, Left upper leg, Face, Right lower leg, Left lower leg   Body parts bathed by helper: Buttocks     Bathing assist Assist Level: Minimal Assistance - Patient > 75%     Upper Body Dressing/Undressing Upper body dressing   What is the patient wearing?: Pull over shirt, Bra    Upper body assist Assist Level: Minimal Assistance - Patient > 75%    Lower Body Dressing/Undressing Lower body dressing      What is the patient wearing?: Pants, Underwear/pull up     Lower body assist Assist for lower body dressing: Minimal Assistance - Patient > 75%     Toileting Toileting    Toileting assist Assist for toileting: Moderate Assistance - Patient 50 - 74%     Transfers Chair/bed transfer  Transfers assist  Chair/bed transfer activity did not occur: N/A  Chair/bed transfer assist level: Minimal Assistance - Patient > 75% Chair/bed transfer assistive device:   Ambulation assist      Assist level: Minimal Assistance - Patient > 75% Assistive device: Walker-rolling Max distance: 139ft  Walk 10 feet activity   Assist     Assist level: Minimal Assistance - Patient > 75% Assistive device: Walker-rolling   Walk 50 feet activity   Assist Walk 50 feet with 2 turns activity did not occur: Safety/medical concerns  Assist level: Minimal Assistance - Patient > 75% Assistive device: Walker-rolling    Walk 150 feet activity   Assist Walk 150 feet activity did not occur: Safety/medical concerns  Assist level: Minimal Assistance - Patient > 75% Assistive device: Walker-rolling    Walk 10 feet on uneven surface  activity   Assist Walk 10  feet on uneven surfaces activity did not occur: Safety/medical concerns         Wheelchair     Assist Will patient use wheelchair at discharge?: Yes Type of Wheelchair: Manual    Wheelchair assist level: Contact Guard/Touching assist Max wheelchair distance: 174ft    Wheelchair 50 feet with 2 turns activity    Assist        Assist Level: Contact Guard/Touching assist   Wheelchair 150 feet activity     Assist      Assist Level: Maximal Assistance - Patient 25 - 49% (pt propels 50 ft (33%))   Blood pressure 111/67, pulse 92, temperature 98.4 F (36.9 C), temperature source Oral, resp. rate 17, height 5\' 7"  (1.702 m), weight 97.6 kg, SpO2 97 %.  Medical Problem List and Plan: 1.  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  Patient received 5 days IV Solu-Medrol/5 treatments and plasmapheresis             -patient may shower             -ELOS/Goals: 14 days, supervision to min assist with PT and OT  -Continue CIR therapies including PT, OT, and SLP  2.  Impaired mobility: continue SCDs 3. Pain at bilateral temples: may be secondary to bruxism- discussed with therapy- we do not have mouth guards here. Hopefully amitriptyline will allow her to sleep better and grind teeth less. Discussed using tylenol PRN- this helped.  4. Anxiety: continue amitriptyline 10mg  HS.              -antipsychotic agents: N/A 5. Neuropsych: This patient is capable of making decisions on her own behalf. 6. Skin/Wound Care: Routine skin checks 7. Fluids/Electrolytes/Nutrition: Routine in and outs with follow-up chemistries 8.  Hypertension.  Decrease Toprol-XL to 12.5 mg daily.  Monitor with increased mobility. 9.  Hypothyroidism.  Continue Synthroid. 10.  H. pylori.  Treatment initiated 6/24.  Amoxicillin 1 g, Biaxin 500 mg twice daily, PPI twice daily x14 days total 11.  UTI/ESBL/E. Coli/urinary retention.  Contact precautions.  Patient completed course of fosfomycin.   Foley discontinued             -UC with no growth.              -continue trial of flomax, urecholine   -to bsc or toilet to void 12.  Obesity.  BMI 31.28.  Dietary follow-up 13.  CKD stage III.  Cr is trending upward again. Repeat tomorrow to trend. Nursing order placed to encourage 6-8 glasses of water per day.  14.  Tobacco abuse.  Continue counseling 15. Hypokalemia: klor supplemented, resolved.  16. Vitamin B1 deficiency: continue thiamin 100mg  daily 17. Insomnia: continue amitriptyline 10mg  HS     LOS: 7 days A FACE TO FACE EVALUATION WAS PERFORMED  Diane Todd Diane Todd 05/24/2021, 11:23 AM

## 2021-05-24 NOTE — Progress Notes (Addendum)
Occupational Therapy Weekly Progress Note  Patient Details  Name: Diane Todd MRN: [id number] Date of Birth: 07-04-2001  Beginning of progress report period: May 18, 2021 End of progress report period: May 24, 2021  Today's Date: 05/24/2021 OT Individual Time: 0086-7619 OT Individual Time Calculation (min): 75 min    Patient has met 4 of 4 short term goals.  Pt is making steady progress towards goals.  Pt is able to complete sit > stand from EOB and w/c with min assist, but continues to require to stand mod assist from [facility] toilet (due to change in height and increased anxiousness).  Pt is able to complete bathing and LB dressing in figure 4 position, requiring min assist to CGA for standing balance during LB hygiene and dressing.  Pt demonstrates decreased gross motor control with BUE when reaching for and utilizing hands for self-care and self-feeding tasks.   Patient continues to demonstrate the following deficits: muscle weakness, decreased cardiorespiratoy endurance, unbalanced muscle activation, decreased coordination, and decreased motor planning, decreased visual acuity and decreased visual motor skills, decreased problem solving and decreased memory, and decreased standing balance, decreased postural control, and decreased balance strategies and therefore will continue to benefit from skilled OT intervention to enhance overall performance with BADL and Reduce care partner burden.  Patient progressing toward long term goals.Marland Kitchen Upgraded sit> stand, standing balance, and bathing and LB dressing.  Continue plan of care.   OT Short Term Goals Week 1:  OT Short Term Goal 1 (Week 1): Pt will complete STS in prep for standing ADL with min A + LRAD. OT Short Term Goal 1 - Progress (Week 1): Met OT Short Term Goal 2 (Week 1): Pt will completed toilet transfer with mod A with LRAD. OT Short Term Goal 2 - Progress (Week 1): Met OT Short Term Goal 3 (Week 1): Pt will verbalize 3 energy  conservation strategies with min VCs. OT Short Term Goal 3 - Progress (Week 1): Met OT Short Term Goal 4 (Week 1): Pt will don pants with mod A + AE PRN. OT Short Term Goal 4 - Progress (Week 1): Met Week 2:  OT Short Term Goal 1 (Week 2): Pt will complete toilet transfer with min assist with LRAD OT Short Term Goal 2 (Week 2): Pt will complete LB dressing with supervision 3/4 times for increased endurance and balance OT Short Term Goal 3 (Week 2): Pt will complete 2 grooming tasks in standing for increased balance and activity tolerance  Skilled Therapeutic Interventions/Progress Updates:    Treatment session with focus on self-care retraining, functional transfers, and energy conservation.  Pt received upright in bed agreeable to shower.  Pt completed bed mobility CGA and stand pivot transfer to w/c with Min assist.  Pt transported to ADL tubroom to simulate home setup.  Therapist demonstrated tub/shower transfer with tub bench with pt able to return demonstration with min assist and min cues due to anxiety with new task.  Pt completed bathing from tub bench with supervision/setup overall and min assist to stand to wash buttocks.  Therapist assisted with washing buttocks due to pt fearfulness in standing.  Pt completed UB dressing with min assist to pull sports bra down over back.  Pt transferred stand pivot tub bench > w/c with min assist.  Pt completed LB dressing at sit > stand with min assist for standing balance.  Pt donned socks and shoes in figure 4 position.  Pt returned to room and completed oral care  from sitting position for energy conservation due to reports of fatigue post shower.  Therapist educated on energy conservation strategies for shower with pt reporting understanding.  Pt's sister present throughout session also verbalizing understanding of energy conservation recommendations.  Discussed tub bench vs shower chair, as pt has shower chair already.  Pt remained upright in w/c with  sister present and all needs in reach.  Therapy Documentation Precautions:  Precautions Precautions: Fall Precaution Comments: contact precautions Restrictions Weight Bearing Restrictions: No General:   Vital Signs: Therapy Vitals Temp: 98.4 F (36.9 C) Temp Source: Oral Pulse Rate: 92 Resp: 17 BP: 111/67 Patient Position (if appropriate): Lying Oxygen Therapy SpO2: 97 % Pain:  Pt with no c/o pain    Therapy/Group: Individual Therapy  Simonne Come 05/24/2021, 8:41 AM

## 2021-05-24 NOTE — Plan of Care (Signed)
  Problem: RH Balance Goal: LTG Patient will maintain dynamic standing with ADLs (OT) Description: LTG:  Patient will maintain dynamic standing balance with assist during activities of daily living (OT)  Flowsheets (Taken 05/24/2021 0930) LTG: Pt will maintain dynamic standing balance during ADLs with: Supervision/Verbal cueing Note: Upgraded due to improved standing balance and activity tolerance   Problem: Sit to Stand Goal: LTG:  Patient will perform sit to stand in prep for activites of daily living with assistance level (OT) Description: LTG:  Patient will perform sit to stand in prep for activites of daily living with assistance level (OT) Flowsheets (Taken 05/24/2021 0930) LTG: PT will perform sit to stand in prep for activites of daily living with assistance level: Supervision/Verbal cueing Note: Upgraded due to improved standing balance and activity tolerance   Problem: RH Bathing Goal: LTG Patient will bathe all body parts with assist levels (OT) Description: LTG: Patient will bathe all body parts with assist levels (OT) Flowsheets (Taken 05/24/2021 0930) LTG: Pt will perform bathing with assistance level/cueing: Contact Guard/Touching assist Note: Upgraded due to improved standing balance and activity tolerance   Problem: RH Dressing Goal: LTG Patient will perform lower body dressing w/assist (OT) Description: LTG: Patient will perform lower body dressing with assist, with/without cues in positioning using equipment (OT) Flowsheets (Taken 05/24/2021 0930) LTG: Pt will perform lower body dressing with assistance level of: Contact Guard/Touching assist Note: Upgraded due to improved standing balance and activity tolerance

## 2021-05-24 NOTE — Progress Notes (Signed)
Physical Therapy Weekly Progress Note  Patient Details  Name: Diane Todd MRN: [id number] Date of Birth: 2001/08/22  Beginning of progress report period: May 18, 2021 End of progress report period: May 24, 2021  Today's Date: 05/24/2021 PT Individual Time: 1000-1056 PT Individual Time Calculation (min): 56 min   Patient has met 3 of 4 short term goals. Pt demonstrates steady progress towards long term goals. Pt is currently able to perform bed mobility with supervision using bedrails, sit<>stands with RW and min/mod A, stand<>pivot transfers with min A, ambulate 155f with RW and CGA/min A, and navigate 4 steps with 2 rails and min A. Pt continues to be limited by anxiety, decreased self-efficacy and confidence, fear, cognitive deficits, generalized weakness and deconditioning, and decreased balance/postural control.  Patient continues to demonstrate the following deficits muscle weakness, decreased cardiorespiratoy endurance, impaired timing and sequencing, abnormal tone, unbalanced muscle activation, ataxia, decreased coordination, and decreased motor planning, decreased problem solving and decreased memory, and decreased standing balance, decreased postural control, and decreased balance strategies and therefore will continue to benefit from skilled PT intervention to increase functional independence with mobility.  Patient progressing toward long term goals..  Continue plan of care.  PT Short Term Goals Week 1:  PT Short Term Goal 1 (Week 1): Pt will perform bed mobility consistently with MinA PT Short Term Goal 1 - Progress (Week 1): Met PT Short Term Goal 2 (Week 1): Pt will perform sit to stands with consistently with minA PT Short Term Goal 2 - Progress (Week 1): Progressing toward goal PT Short Term Goal 3 (Week 1): Pt will perform stand pivot transfers consistently with minA PT Short Term Goal 3 - Progress (Week 1): Met PT Short Term Goal 4 (Week 1): Pt will complete 4 steps  using 3 inch step with ModA, BUE support on rails in order to reach LTG PT Short Term Goal 4 - Progress (Week 1): Met Week 2:  PT Short Term Goal 1 (Week 2): Pt will perform sit to stands with consistently with minA PT Short Term Goal 2 (Week 2): Pt will perform bed<>chair transfers with LRAD and CGA PT Short Term Goal 3 (Week 2): Pt will perfom car transfer with LRAD and CGA  Skilled Therapeutic Interventions/Progress Updates:  Ambulation/gait training;Discharge planning;Functional mobility training;Psychosocial support;Therapeutic Activities;Visual/perceptual remediation/compensation;Balance/vestibular training;Disease management/prevention;Neuromuscular re-education;Skin care/wound management;Wheelchair propulsion/positioning;Therapeutic Exercise;Cognitive remediation/compensation;DME/adaptive equipment instruction;Pain management;Splinting/orthotics;UE/LE Strength taining/ROM;Community reintegration;Functional electrical stimulation;Stair training;Patient/family education;UE/LE Coordination activities   Today's Interventions: [facility], pt agreeable to PT treatment, and denied any pain during session. Updated pt's memory notebook with events from PT yesterday. Pt wrote 1/3 activities yesterday but able to recall other 2 activities (bike and stairs) with min cues. Pt excited about getting a shower this morning and PT encouraged pt to write this down in notebook as well. Session with emphasis on functional mobility/transfers, generalized strengthening, dynamic standing balance/coordination, gait training, and improved activity tolerance. Donned shoes with supervision and [facility]. Sit<>stand with RW and heavy min A and ambulated 1870fwith RW and CGA/light min A. Pt continues to demonstrate generalized unsteadiness and mild ataxia but no LOB noted. Worked on dynamic standing balance playing Wii  bowling with 1 UE support on RW and CGA for balance for 8 minutes (10 rounds). Pt reported her "legs felt tired" afterwards but overall demonstrates improved endurance and standing tolerance - reported 7/10 fatigue after activity. Pt  performed the following exercises sitting in [facility] with supervision and verbal cues for technique: -hip flexion 2x15 bilaterally with 1lb ankle weight -LAQ 2x15 bilaterally with 1lb ankle weight  Pt transported back to room in WC total A. Concluded session with pt sitting in WC, needs within reach, and chair pad alarm on. Sister present at bedside.   Of note, pt now has order for grounds pass to be used with her sister.   Therapy Documentation Precautions:  Precautions Precautions: Fall Precaution Comments: contact precautions Restrictions Weight Bearing Restrictions: No  Therapy/Group: Individual Therapy Alfonse Alpers PT, DPT   05/24/2021, 7:27 AM

## 2021-05-25 NOTE — Progress Notes (Signed)
Occupational Therapy Session Note  Patient Details  Name: Diane Todd MRN: [id number] Date of Birth: 2001-03-05  Today's Date: 05/25/2021 OT Individual Time: 7989-2119 OT Individual Time Calculation (min): 38 min    Short Term Goals: Week 2:  OT Short Term Goal 1 (Week 2): Pt will complete toilet transfer with min assist with LRAD OT Short Term Goal 2 (Week 2): Pt will complete LB dressing with supervision 3/4 times for increased endurance and balance OT Short Term Goal 3 (Week 2): Pt will complete 2 grooming tasks in standing for increased balance and activity tolerance  Skilled Therapeutic Interventions/Progress Updates:    Pt received semi-reclined in bed with sister present, denies, agreeable to therapy. Session focus on toileting, activity tolerance, functional mobility in prep for improved ADL/IADL performance. Came to sitting EOB close S. Donned/doffed B shoes close S.  STS from elevated surface with RW with CGA. Amb to toilet with RW and CGA. Mod A for LBD clothing management and posterior pericare post continent void of b/b. Min A to stand up from lower BSC surface. Amb ~175 ft in hallway with 1 standing rest break 3/4 of the way through with CGA and RW. Relates she's very anxious prior to DC home, therapeutic use of self utilized to remind pt of rehab progress and potentional and support systems in place. Pt states "I just need to get out of my head, " but demonstrates improved affect with reminder of progress thus far. Stand-pivot back to bed with mod A for STS.   Pt left seated EOB with sister present,  with bed alarm engaged, call bell in reach, and all immediate needs met.    Therapy Documentation Precautions:  Precautions Precautions: Fall Precaution Comments: contact precautions Restrictions Weight Bearing Restrictions: No  Pain:   denies ADL: See Care Tool for more details.   Therapy/Group: Individual Therapy  Volanda Napoleon MS, OTR/L  05/25/2021, 6:54  AM

## 2021-05-25 NOTE — Progress Notes (Signed)
PROGRESS NOTE   Subjective/Complaints: Slept better last night Denies jaw pain Having regular BM Ambulated 150 feet today!  ROS: Patient denies fever, rash, sore throat,  , nausea, vomiting, diarrhea, cough, shortness of breath or chest pain, joint or back pain, headache, or mood change, constipation, +insomnia-improving, +anxiety, +pain at bilateral temples- resolved   Objective:   No results found. No results for input(s): WBC, HGB, HCT, PLT in the last 72 hours.  No results for input(s): NA, K, CL, CO2, GLUCOSE, BUN, CREATININE, CALCIUM in the last 72 hours.   Intake/Output Summary (Last 24 hours) at 05/25/2021 1803 Last data filed at 05/25/2021 1350 Gross per 24 hour  Intake 840 ml  Output --  Net 840 ml        Physical Exam: Vital Signs Blood pressure (!) 109/59, pulse 82, temperature 98.7 F (37.1 C), resp. rate 15, height 5\' 7"  (1.702 m), weight 96.4 kg, SpO2 100 %. Gen: no distress, normal appearing HEENT: oral mucosa pink and moist, NCAT Cardio: Reg rate Chest: normal effort, normal rate of breathing Abd: soft, non-distended Ext: no edema Psych: pleasant, normal affect Skin: Clean and intact without signs of breakdown Neuro: alert, oriented to person, place, mo/year. Fair insight and awareness. Blurred vision but diplopia less today, tracking better to all fields. Motor 4- to 4/5 UE's and 3-4/5 LE's. Sensory 1+/2 LE seem more affected than uppers--no change. DTR's 3+ in all 4's.  Musculoskeletal: Full ROM, No pain with AROM or PROM in the neck, trunk, or extremities. Posture appropriate. Tenderness to palpation bilateral temples GU: voiding, PVR 0.   Assessment/Plan: 1. Functional deficits which require 3+ hours per day of interdisciplinary therapy in a comprehensive inpatient rehab setting. Physiatrist is providing close team supervision and 24 hour management of active medical problems listed  below. Physiatrist and rehab team continue to assess barriers to discharge/monitor patient progress toward functional and medical goals  Care Tool:  Bathing    Body parts bathed by patient: Right arm, Left arm, Chest, Abdomen, Front perineal area, Right upper leg, Left upper leg, Face, Right lower leg, Left lower leg   Body parts bathed by helper: Buttocks     Bathing assist Assist Level: Minimal Assistance - Patient > 75%     Upper Body Dressing/Undressing Upper body dressing   What is the patient wearing?: Pull over shirt, Bra    Upper body assist Assist Level: Minimal Assistance - Patient > 75%    Lower Body Dressing/Undressing Lower body dressing      What is the patient wearing?: Pants, Underwear/pull up     Lower body assist Assist for lower body dressing: Minimal Assistance - Patient > 75%     Toileting Toileting    Toileting assist Assist for toileting: Moderate Assistance - Patient 50 - 74%     Transfers Chair/bed transfer  Transfers assist  Chair/bed transfer activity did not occur: N/A  Chair/bed transfer assist level: Minimal Assistance - Patient > 75% Chair/bed transfer assistive device: assist      Assist level: Contact Guard/Touching assist Assistive device: Walker-rolling Max distance: 168ft   Walk 10 feet activity  Assist     Assist level: Contact Guard/Touching assist Assistive device: Walker-rolling   Walk 50 feet activity   Assist Walk 50 feet with 2 turns activity did not occur: Safety/medical concerns  Assist level: Contact Guard/Touching assist Assistive device: Walker-rolling    Walk 150 feet activity   Assist Walk 150 feet activity did not occur: Safety/medical concerns  Assist level: Contact Guard/Touching assist Assistive device: Walker-rolling    Walk 10 feet on uneven surface  activity   Assist Walk 10 feet on uneven surfaces activity did not occur:  Safety/medical concerns         Wheelchair     Assist Will patient use wheelchair at discharge?: Yes Type of Wheelchair: Manual    Wheelchair assist level: Contact Guard/Touching assist Max wheelchair distance: 152ft    Wheelchair 50 feet with 2 turns activity    Assist        Assist Level: Contact Guard/Touching assist   Wheelchair 150 feet activity     Assist      Assist Level: Maximal Assistance - Patient 25 - 49% (pt propels 50 ft (33%))   Blood pressure (!) 109/59, pulse 82, temperature 98.7 F (37.1 C), resp. rate 15, height 5\' 7"  (1.702 m), weight 96.4 kg, SpO2 100 %.  Medical Problem List and Plan: 1.  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  Patient received 5 days IV Solu-Medrol/5 treatments and plasmapheresis             -patient may shower             -ELOS/Goals: 14 days, supervision to min assist with PT and OT  -Continue CIR therapies including PT, OT, and SLP  2.  Impaired mobility: continue SCDs 3. Pain at bilateral temples: may be secondary to bruxism- discussed with therapy- we do not have mouth guards here. Hopefully amitriptyline will allow her to sleep better and grind teeth less. Discussed using tylenol PRN- this helped.  4. Anxiety: continue amitriptyline 10mg  HS.              -antipsychotic agents: N/A 5. Neuropsych: This patient is capable of making decisions on her own behalf. 6. Skin/Wound Care: Routine skin checks 7. Fluids/Electrolytes/Nutrition: Routine in and outs with follow-up chemistries 8.  Hypertension. Resolved, now hypotensive. D/c Toprol.  Monitor with increased mobility. 9.  Hypothyroidism.  Continue Synthroid. 10.  H. pylori.  Treatment initiated 6/24.  Amoxicillin 1 g, Biaxin 500 mg twice daily, PPI twice daily x14 days total 11.  UTI/ESBL/E. Coli/urinary retention.  Contact precautions.  Patient completed course of fosfomycin.  Foley discontinued             -UC with no growth.               -continue trial of flomax, urecholine   -to bsc or toilet to void 12.  Obesity.  BMI 31.28.  Dietary follow-up 13.  CKD stage III.  Cr is trending upward again. Repeat tomorrow to trend. Nursing order placed to encourage 6-8 glasses of water per day.  14.  Tobacco abuse.  Continue counseling 15. Hypokalemia: klor supplemented, resolved.  16. Vitamin B1 deficiency: continue thiamin 100mg  daily 17. Insomnia: continue amitriptyline 10mg  HS     LOS: 8 days A FACE TO FACE EVALUATION WAS PERFORMED  7/24 Cashus Halterman 05/25/2021, 6:03 PM

## 2021-05-25 NOTE — Progress Notes (Signed)
Physical Therapy Session Note  Patient Details  Name: Diane Todd MRN: [id number] Date of Birth: [birth year]  Today's Date: 05/25/2021 PT Individual Time: 1014-1110 PT Individual Time Calculation (min): 56 min   Short Term Goals: Week 2:  PT Short Term Goal 1 (Week 2): Pt will perform sit to stands with consistently with minA PT Short Term Goal 2 (Week 2): Pt will perform bed<>chair transfers with LRAD and CGA PT Short Term Goal 3 (Week 2): Pt will perfom car transfer with LRAD and CGA  Skilled Therapeutic Interventions/Progress Updates:    Pt received supine in bed with her sister present and pt agreeable to therapy session. Supine>sitting R EOB, via self selected logroll technique, HOB slightly elevated and using bedrail with supervision and increased effort noted to bring trunk up. Sitting EOB donned shoes with set-up assist. Sit>stand EOB>RW requiring 2nd attempt in order to come to stand with min assist - did not have enough strength to power up on 1st attempt. Stand pivot towards L to w/c using RW with CGA.  Transported to/from gym in w/c for time management and energy conservation. Sit>stand w/c>RW with CGA for steadying, rising on 1st attempt. Gait training 163ft using RW with CGA/slight min assist - demos slight L pelvic external rotation, L LE excessively turned out, increased reliance on B UE support on RW, and instability/shakiness noted in LEs throughout gait due to muscle fatigue. While ambulating, reports L calf muscle discomfort, this dissipates during seated rest break and is not present when ambulating later in session.  Sit>supine on mat with supervision, increased effort/time to raise LEs up onto mat. Supine bridging 3x10 reps targeting increased glute activation, demos good hip clearance that improves with increased reps. Supine>sitting on mat with close supervision and cuing for sequencing to increase pt independence, continues to self-select logroll technique, pt  initially states she will need help but with encouragement is agreeable to attempt without hands-on assist and is successful.  Repeated sit<>stands to/from elevated EOM (24in tall) using B UE support as needed on mat with CGA - 2x8 reps - cuing for increased glute activation to extend hips upon coming to stand and decrease reliance on backs of legs against mat.  Gait training 179ft using RW with CGA for steadying - pt demos improvements compared to earlier gait with increased smoothness of B LE coordination, increasing gait speed, and increased L LE positioning - cuing throughout for improved upright posture and decreased WBing through arms on AD.  Throughout session provided pt recall/memory tasks of remembering distance ambulated, exercises performed, etc to address cognitive impairments - pt demos significant improvement in this and increased confidence to take time to think about the answer to the question instead of automatically stating "I don't know."   Transported back up to room. Short distance ~89ft ambulatory transfer w/c>EOB using RW with min assist to come to stand (requiring 2nd attempt) and CGA for steadying while ambulating. Pt left sitting EOB with needs in reach and bed alarm on with her sister present.   Therapy Documentation Precautions:  Precautions Precautions: Fall Precaution Comments: contact precautions Restrictions Weight Bearing Restrictions: No   Pain:  Reports only experiencing that L calf discomfort during 1st gait training trial.    Therapy/Group: Individual Therapy  Ginny Forth , PT, DPT, NCS, CSRS 05/25/2021, 7:54 AM

## 2021-05-26 NOTE — Progress Notes (Signed)
Slept for 7 hours. Sister at bedside. No complaints of pain or discomfort. Pt transferred with assist to toilet, able to void without difficulty.

## 2021-05-27 DIAGNOSIS — F411 Generalized anxiety disorder: Secondary | ICD-10-CM

## 2021-05-27 DIAGNOSIS — M7918 Myalgia, other site: Secondary | ICD-10-CM

## 2021-05-27 LAB — VITAMIN B1: Vitamin B1 (Thiamine): 192.7 nmol/L (ref 66.5–200.0)

## 2021-05-27 NOTE — Progress Notes (Signed)
Occupational Therapy Session Note  Patient Details  Name: Diane Todd MRN: [id number] Date of Birth: June 07, 2001  Today's Date: 05/27/2021 OT Individual Time: 8546-2703 OT Individual Time Calculation (min): 70 min    Short Term Goals: Week 2:  OT Short Term Goal 1 (Week 2): Pt will complete toilet transfer with min assist with LRAD OT Short Term Goal 2 (Week 2): Pt will complete LB dressing with supervision 3/4 times for increased endurance and balance OT Short Term Goal 3 (Week 2): Pt will complete 2 grooming tasks in standing for increased balance and activity tolerance  Skilled Therapeutic Interventions/Progress Updates:    Treatment session with focus on self-care retraining, functional transfers, and dynamic standing balance for LB bathing/dressing.  Pt received in bed asleep, easily aroused to name.  Pt expressing desire to shower this session.  Pt completed bed mobility supervision to sitting EOB.  Completed sit > stand with RW with min assist and stand pivot to w/c with CGA.  Pt transported down to ADL tubroom for shower to simulate home environment.  Pt reports unable to recall shower transfer technique, therefore therapist demonstrated transfer with pt then able to replicate with min assist due to fearfulness of falling.  Pt completed bathing overall supervision while seated on tub bench, requiring min assist sit > stand and CGA when standing to wash buttocks.  Pt transferred to w/c min assist stand pivot and completed dressing with setup overall, except min assist sit > stand and CGA when standing to pull pants over hips.  Pt reports fearfulness and overwhelmed when standing to pull pants over hips due to difficulty with tight underwear. Therapist encouraged pt to take a minute and then attempt again.  Pt able to don pants, socks, shoes in figure 4 position prior to standing to pull pants over hips.  Pt returned to room and completed oral care seated due to fatigue post shower.  Pt  remained upright in w/c with sister present.  Therapy Documentation Precautions:  Precautions Precautions: Fall Precaution Comments: contact precautions Restrictions Weight Bearing Restrictions: No General:   Vital Signs: Therapy Vitals Temp: 98.1 F (36.7 C) Temp Source: Oral Pulse Rate: 86 Resp: 16 BP: 112/80 Patient Position (if appropriate): Lying Oxygen Therapy SpO2: 100 % O2 Device: Room Air Pain:  Pt with no c/o pain    Therapy/Group: Individual Therapy  Rosalio Loud 05/27/2021, 9:01 AM

## 2021-05-27 NOTE — Progress Notes (Signed)
Physical Therapy Session Note  Patient Details  Name: Diane Todd MRN: [id number] Date of Birth: 08-Nov-2001  Today's Date: 05/27/2021 PT Individual Time: 1100-1155 PT Individual Time Calculation (min): 55 min   Short Term Goals: Week 1:  PT Short Term Goal 1 (Week 1): Pt will perform bed mobility consistently with MinA PT Short Term Goal 1 - Progress (Week 1): Met PT Short Term Goal 2 (Week 1): Pt will perform sit to stands with consistently with minA PT Short Term Goal 2 - Progress (Week 1): Progressing toward goal PT Short Term Goal 3 (Week 1): Pt will perform stand pivot transfers consistently with minA PT Short Term Goal 3 - Progress (Week 1): Met PT Short Term Goal 4 (Week 1): Pt will complete 4 steps using 3 inch step with ModA, BUE support on rails in order to reach LTG PT Short Term Goal 4 - Progress (Week 1): Met Week 2:  PT Short Term Goal 1 (Week 2): Pt will perform sit to stands with consistently with minA PT Short Term Goal 2 (Week 2): Pt will perform bed<>chair transfers with LRAD and CGA PT Short Term Goal 3 (Week 2): Pt will perfom car transfer with LRAD and CGA  Skilled Therapeutic Interventions/Progress Updates:   Received pt semi-reclined in bed, pt agreeable to PT treatment, and denied any pain during session. Session with emphasis on functional mobility/transfers, generalized strengthening, dynamic standing balance/coordination, gait training, NMR, and improved activity tolerance. Semi-reclined<>sitting EOB with HOB elevated and use of bedrails with supervision and donned shoes with set up assist. Stand<>pivot bed<>WC with RW and min A. Pt transported to 4W therapy gym in [facility] total A for time management purposes. Sit<>stand<>tall kneeling and tall kneeling<>stand<>sit with min A x 2 trials. Pt performed the following activities in tall kneeling: -hip extension with BUE support on k bench 2x10 reps to fatigue with cues to "touch buttocks to heels " and to "squeeze  buttocks" to achieve full hip extension -side stepping on knees with BUE support on k bench with close supervision x5 laps to target hip abductors. - pt with visible hip weakness demonstrated by shaking. Reported 8/10 fatigue afterwards.  Pt then ambulated 74f with RW and min A. Pt with visible quad trembling R>L and reported feeling weak and demonstrating increased unsteadiness with gait. Due to fatigue, pt then performed the following exercises sitting on mat with supervision and verbal cues for technique: -hip flexion x12 bilaterally -LAQ 12 bilaterally  -hamstring curls with grn TB x12 bilaterally -hip abduction with grn TB 2x10 Stand<>pivot mat<>WC with RW and min A and [facility]. Encouraged pt to sit up for lunch. Concluded session with pt sitting in WC, needs within reach, and chair pad alarm on.   Therapy Documentation Precautions:  Precautions Precautions: Fall Precaution Comments: contact precautions Restrictions Weight Bearing Restrictions: No  Therapy/Group: Individual Therapy AAlfonse AlpersPT, DPT   05/27/2021, 7:31 AM

## 2021-05-27 NOTE — Progress Notes (Signed)
PROGRESS NOTE   Subjective/Complaints: Complains of tension in her neck. Wonders if it's from clenching her jaws. Improving mobility, overall happy with progress  ROS: Patient denies fever, rash, sore throat, blurred vision, nausea, vomiting, diarrhea, cough, shortness of breath or chest pain, headache, or mood change.    Objective:   No results found. No results for input(s): WBC, HGB, HCT, PLT in the last 72 hours.  No results for input(s): NA, K, CL, CO2, GLUCOSE, BUN, CREATININE, CALCIUM in the last 72 hours.   Intake/Output Summary (Last 24 hours) at 05/27/2021 1206 Last data filed at 05/26/2021 1850 Gross per 24 hour  Intake 0 ml  Output --  Net 0 ml        Physical Exam: Vital Signs Blood pressure 112/80, pulse 86, temperature 98.1 F (36.7 C), temperature source Oral, resp. rate 16, height 5\' 7"  (1.702 m), weight 94.5 kg, SpO2 100 %. Constitutional: No distress . Vital signs reviewed. HEENT: EOMI, oral membranes moist Neck: supple Cardiovascular: RRR without murmur. No JVD    Respiratory/Chest: CTA Bilaterally without wheezes or rales. Normal effort    GI/Abdomen: BS +, non-tender, non-distended Ext: no clubbing, cyanosis, or edema Psych: pleasant and cooperative  Skin: Clean and intact without signs of breakdown Neuro: alert, oriented to person, place, mo/year. Fair insight and awareness. Blurred vision but diplopia less today, tracking better to all fields. Motor 4- to 4/5 UE's and + to 4/5 LE's. Sensory 1+/2 LE essentially stable.. DTR's 3+ in all 4's.  Musculoskeletal: Full ROM, No pain with AROM or PROM in the neck, trunk, or extremities. Posture appropriate. Both SCM's tender with taut bands palpable left more than right. Pain worsened with rotation of head to either side     Assessment/Plan: 1. Functional deficits which require 3+ hours per day of interdisciplinary therapy in a comprehensive inpatient  rehab setting. Physiatrist is providing close team supervision and 24 hour management of active medical problems listed below. Physiatrist and rehab team continue to assess barriers to discharge/monitor patient progress toward functional and medical goals  Care Tool:  Bathing    Body parts bathed by patient: Right arm, Left arm, Chest, Abdomen, Front perineal area, Right upper leg, Left upper leg, Face, Right lower leg, Left lower leg, Buttocks   Body parts bathed by helper: Buttocks     Bathing assist Assist Level: Contact Guard/Touching assist     Upper Body Dressing/Undressing Upper body dressing   What is the patient wearing?: Pull over shirt, Bra    Upper body assist Assist Level: Set up assist    Lower Body Dressing/Undressing Lower body dressing      What is the patient wearing?: Pants, Underwear/pull up     Lower body assist Assist for lower body dressing: Minimal Assistance - Patient > 75%     Toileting Toileting    Toileting assist Assist for toileting: Moderate Assistance - Patient 50 - 74%     Transfers Chair/bed transfer  Transfers assist  Chair/bed transfer activity did not occur: N/A  Chair/bed transfer assist level: Minimal Assistance - Patient > 75% Chair/bed transfer assistive device:   Ambulation assist  Assist level: Contact Guard/Touching assist Assistive device: Walker-rolling Max distance: 14ft   Walk 10 feet activity   Assist     Assist level: Contact Guard/Touching assist Assistive device: Walker-rolling   Walk 50 feet activity   Assist Walk 50 feet with 2 turns activity did not occur: Safety/medical concerns  Assist level: Contact Guard/Touching assist Assistive device: Walker-rolling    Walk 150 feet activity   Assist Walk 150 feet activity did not occur: Safety/medical concerns  Assist level: Contact Guard/Touching assist Assistive device: Walker-rolling    Walk 10  feet on uneven surface  activity   Assist Walk 10 feet on uneven surfaces activity did not occur: Safety/medical concerns         Wheelchair     Assist Will patient use wheelchair at discharge?: Yes Type of Wheelchair: Manual    Wheelchair assist level: Contact Guard/Touching assist Max wheelchair distance: 176ft    Wheelchair 50 feet with 2 turns activity    Assist        Assist Level: Contact Guard/Touching assist   Wheelchair 150 feet activity     Assist      Assist Level: Maximal Assistance - Patient 25 - 49% (pt propels 50 ft (33%))   Blood pressure 112/80, pulse 86, temperature 98.1 F (36.7 C), temperature source Oral, resp. rate 16, height 5\' 7"  (1.702 m), weight 94.5 kg, SpO2 100 %.  Medical Problem List and Plan: 1.  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  Patient received 5 days IV Solu-Medrol/5 treatments and plasmapheresis             -patient may shower             -ELOS/Goals: 14 days, supervision to min assist with PT and OT  -Continue CIR therapies including PT, OT, and SLP  2.  Impaired mobility: continue SCDs 3. Pain at bilateral temples/bilateral SCM's: may be secondary to bruxism- discussed with therapy- we do not have mouth guards here.  - amitriptyline helping to sleep better and grind teeth less? -reviewed stretches she can do for her neck  -might be worthwhile to have family bring in mouth piece from outside -Discussed using tylenol PRN- this helped.  4. Anxiety: continue amitriptyline 10mg  HS--seems to help             -antipsychotic agents: N/A 5. Neuropsych: This patient is capable of making decisions on her own behalf. 6. Skin/Wound Care: Routine skin checks 7. Fluids/Electrolytes/Nutrition: Routine in and outs with follow-up chemistries 8.  Hypertension. Resolved, now hypotensive. D/c'ed Toprol.   -bp controlled 7/11 9.  Hypothyroidism.  Continue Synthroid. 10.  H. pylori.  Treatment initiated  6/24.  Amoxicillin 1 g, Biaxin 500 mg twice daily, PPI twice daily x14 days total 11.  UTI/ESBL/E. Coli/urinary retention.  Contact precautions.  Patient completed course of fosfomycin.  Foley discontinued             -UC with no growth.              -continue trial of flomax, urecholine   -to bsc or toilet to void 12.  Obesity.  BMI 31.28.  Dietary follow-up 13.  CKD stage III.    Nursing order placed to encourage 6-8 glasses of water per day.   -7/11 Cr down to 0.9 today 14.  Tobacco abuse.  Continue counseling 15. Hypokalemia: klor 7/24 supplemented, resolved.  16. Vitamin B1 deficiency: continue thiamin 100mg  daily 17. Insomnia: continue amitriptyline 10mg  HS  LOS: 10 days A FACE TO FACE EVALUATION WAS PERFORMED  Ranelle Oyster 05/27/2021, 12:06 PM

## 2021-05-27 NOTE — Progress Notes (Signed)
Speech Language Pathology Weekly Progress and Session Note  Patient Details  Name: Diane Todd MRN: [id number] Date of Birth: 2001-04-02  Beginning of progress report period: May 20, 2021 End of progress report period: May 27, 2021  Today's Date: 05/27/2021 SLP Individual Time: 1300-1400 SLP Individual Time Calculation (min): 60 min  Short Term Goals: Week 1: SLP Short Term Goal 1 (Week 1): Pt will demonstrate problem solving skills in mildly complex tasks with min A verbal cues. SLP Short Term Goal 1 - Progress (Week 1): Met SLP Short Term Goal 2 (Week 1): Pt will self-monitor and self-correct functional errors with min A verbal cues. SLP Short Term Goal 2 - Progress (Week 1): Met SLP Short Term Goal 3 (Week 1): Pt will demonstrate recall of novel, daily events with mod A verbal cues. SLP Short Term Goal 3 - Progress (Week 1): Met SLP Short Term Goal 4 (Week 1): Pt will demonstrate recall of novel, daily events with use of written aid given supervision A verbal cues. SLP Short Term Goal 4 - Progress (Week 1): Not met    New Short Term Goals: Week 2: SLP Short Term Goal 1 (Week 2): Pt will demonstrate problem solving skills in mildly complex tasks with sup A verbal cues. SLP Short Term Goal 2 (Week 2): Pt will demonstrate recall of novel, daily events with sup-to-min A verbal cues. SLP Short Term Goal 3 (Week 2): Pt will self-monitor and self-correct functional errors with sup A verbal cues. SLP Short Term Goal 4 (Week 2): Pt will demonstrate recall of novel, daily events with use of written aid given supervision A verbal cues.  Weekly Progress Updates: Patient has made excellent gains and has met 3 of 4 STG's this reporting period. Currently, patient continues to require Min A verbal and visual cues for problem solving, working memory, and short-term recall. Patient requires Min A verbal cues for use of written aids to promote recall which appears attributed to hesitancy and  reduced confidence in her abilities. She benefits from frequent verbal encouragement from therapist and family. Patient and family education is ongoing. Patient would benefit from continued SLP services to maximize cognitive function and functional independence prior to discharge.   Intensity: Minumum of 1-2 x/day, 30 to 90 minutes Frequency: 1 to 3 out of 7 days Duration/Length of Stay: 7/20 Treatment/Interventions: Cognitive remediation/compensation;Cueing hierarchy;Medication managment;Internal/external aids;Functional tasks;Patient/family education   Daily Session Skilled Therapeutic Interventions: Patient agreeable to skilled ST intervention with focus on cognitive goals. Patient was accompanied by her sister today. SLP facilitated working memory and short-term recall task involving training on memory strategies 1. categorization, 2. verbal/written repetition to recall list of 8 items that she would bring on a trip to the mountains. Patient was able to recall 7/8 items after 5 minute delay, and 7/8 items after 15 minute delay with sup A verbal cues. Achieved 8/8 with semantic cue. Facilitated visual memory task involving remembering the sequential order in which items were placed in a garden plot. Patient was able to recall 8/8 items successfully following 15 minute delay at Mod I. Patient was known to be hesitant throughout session and stated she is nervous that she won't do something right/what's expected of her. SLP provided verbal encouragement alongside her sister who was very supportive. Patient was left in her wheelchair with alarm activated and needs within reach at the end of session. Continue per ST plan of care.     General    Pain Pain Assessment Pain Scale:  0-10 Pain Score: 0-No pain  Therapy/Group: Individual Therapy  Patty Sermons 05/27/2021, 4:40 PM

## 2021-05-28 MED ORDER — AMITRIPTYLINE HCL 25 MG PO TABS
25.0000 mg | ORAL_TABLET | Freq: Every day | ORAL | Status: DC
  Administered 2021-05-28 – 2021-06-04 (×8): 25 mg via ORAL
  Filled 2021-05-28 (×8): qty 1

## 2021-05-28 NOTE — Progress Notes (Signed)
Speech Language Pathology Daily Session Note  Patient Details  Name: Diane Todd MRN: [id number] Date of Birth: 2001/01/08  Today's Date: 05/28/2021 SLP Individual Time: 1418-1500 SLP Individual Time Calculation (min): 42 min  Short Term Goals: Week 2: SLP Short Term Goal 1 (Week 2): Pt will demonstrate problem solving skills in mildly complex tasks with sup A verbal cues. SLP Short Term Goal 2 (Week 2): Pt will demonstrate recall of novel, daily events with sup-to-min A verbal cues. SLP Short Term Goal 3 (Week 2): Pt will self-monitor and self-correct functional errors with sup A verbal cues. SLP Short Term Goal 4 (Week 2): Pt will demonstrate recall of novel, daily events with use of written aid given supervision A verbal cues.  Skilled Therapeutic Interventions: Patient agreeable to skilled ST intervention with focus on cognitive goals. Patient was accompanied by her sister today. SLP facilitated novel card game "BLINK" with sup-to-min A verbal and visual cues for reminders of task instructions and problem solving skills. Improvement noted with abstract/strategic thinking. Patient also exhibited improved self monitoring and ability to repair errors as task progressed at supervision level involving occasional cues for clarification. Patient was left in her wheelchair with alarm activated and immediate needs within reach at end of session. Continue per ST plan of care.    Pain Pain Assessment Pain Scale: 0-10 Pain Score: 0-No pain  Therapy/Group: Individual Therapy  Tamala Ser 05/28/2021, 4:24 PM

## 2021-05-28 NOTE — Progress Notes (Signed)
PROGRESS NOTE   Subjective/Complaints: Continues to have pain in bilateral temples Sleeping better at night but does wake at night  ROS: Patient denies fever, rash, sore throat, blurred vision, nausea, vomiting, diarrhea, cough, shortness of breath or chest pain, headache, or mood change.    Objective:   No results found. No results for input(s): WBC, HGB, HCT, PLT in the last 72 hours.  No results for input(s): NA, K, CL, CO2, GLUCOSE, BUN, CREATININE, CALCIUM in the last 72 hours.   Intake/Output Summary (Last 24 hours) at 05/28/2021 1232 Last data filed at 05/28/2021 0730 Gross per 24 hour  Intake 840 ml  Output --  Net 840 ml        Physical Exam: Vital Signs Blood pressure 115/76, pulse (!) 105, temperature 98.8 F (37.1 C), temperature source Oral, resp. rate 16, height 5\' 7"  (1.702 m), weight 94.8 kg, SpO2 97 %. Gen: no distress, normal appearing HEENT: oral mucosa pink and moist, NCAT Cardio: Tachycardia Chest: normal effort, normal rate of breathing Abd: soft, non-distended Ext: no edema Psych: pleasant, normal affect  Skin: Clean and intact without signs of breakdown Neuro: alert, oriented to person, place, mo/year. Fair insight and awareness. Blurred vision but diplopia less today, tracking better to all fields. Motor 4- to 4/5 UE's and + to 4/5 LE's. Sensory 1+/2 LE essentially stable.. DTR's 3+ in all 4's.  Musculoskeletal: Full ROM, No pain with AROM or PROM in the neck, trunk, or extremities. Posture appropriate. Both SCM's tender with taut bands palpable left more than right. Pain worsened with rotation of head to either side     Assessment/Plan: 1. Functional deficits which require 3+ hours per day of interdisciplinary therapy in a comprehensive inpatient rehab setting. Physiatrist is providing close team supervision and 24 hour management of active medical problems listed below. Physiatrist and  rehab team continue to assess barriers to discharge/monitor patient progress toward functional and medical goals  Care Tool:  Bathing    Body parts bathed by patient: Right arm, Left arm, Chest, Abdomen, Front perineal area, Right upper leg, Left upper leg, Face, Right lower leg, Left lower leg, Buttocks   Body parts bathed by helper: Buttocks     Bathing assist Assist Level: Contact Guard/Touching assist     Upper Body Dressing/Undressing Upper body dressing   What is the patient wearing?: Pull over shirt, Bra    Upper body assist Assist Level: Set up assist    Lower Body Dressing/Undressing Lower body dressing      What is the patient wearing?: Pants, Underwear/pull up     Lower body assist Assist for lower body dressing: Minimal Assistance - Patient > 75%     Toileting Toileting    Toileting assist Assist for toileting: Moderate Assistance - Patient 50 - 74%     Transfers Chair/bed transfer  Transfers assist  Chair/bed transfer activity did not occur: N/A  Chair/bed transfer assist level: Minimal Assistance - Patient > 75% Chair/bed transfer assistive device: assist      Assist level: Contact Guard/Touching assist Assistive device: Walker-rolling Max distance: 220ft   Walk 10 feet activity  Assist     Assist level: Contact Guard/Touching assist Assistive device: Walker-rolling   Walk 50 feet activity   Assist Walk 50 feet with 2 turns activity did not occur: Safety/medical concerns  Assist level: Contact Guard/Touching assist Assistive device: Walker-rolling    Walk 150 feet activity   Assist Walk 150 feet activity did not occur: Safety/medical concerns  Assist level: Contact Guard/Touching assist Assistive device: Walker-rolling    Walk 10 feet on uneven surface  activity   Assist Walk 10 feet on uneven surfaces activity did not occur: Safety/medical concerns          Wheelchair     Assist Will patient use wheelchair at discharge?: Yes Type of Wheelchair: Manual    Wheelchair assist level: Contact Guard/Touching assist Max wheelchair distance: 132ft    Wheelchair 50 feet with 2 turns activity    Assist        Assist Level: Contact Guard/Touching assist   Wheelchair 150 feet activity     Assist      Assist Level: Maximal Assistance - Patient 25 - 49% (pt propels 50 ft (33%))   Blood pressure 115/76, pulse (!) 105, temperature 98.8 F (37.1 C), temperature source Oral, resp. rate 16, height 5\' 7"  (1.702 m), weight 94.8 kg, SpO2 97 %.  Medical Problem List and Plan: 1.  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  Patient received 5 days IV Solu-Medrol/5 treatments and plasmapheresis             -patient may shower             -ELOS/Goals: 14 days, supervision to min assist with PT and OT  -Continue CIR therapies including PT, OT, and SLP   -Interdisciplinary Team Conference today   2. Impaired mobility: continue SCDs 3. Pain at bilateral temples/bilateral SCM's: may be secondary to bruxism- discussed with therapy- we do not have mouth guards here.  - increase amitriptyline to 25mg  -reviewed stretches she can do for her neck  -might be worthwhile to have family bring in mouth piece from outside -Discussed using tylenol PRN- this helped.  4. Anxiety: increase amitriptyline to 25mg  HS--seems to help             -antipsychotic agents: N/A 5. Neuropsych: This patient is capable of making decisions on her own behalf. 6. Skin/Wound Care: Routine skin checks 7. Fluids/Electrolytes/Nutrition: Routine in and outs with follow-up chemistries 8.  Hypertension. Resolved, now hypotensive. D/c'ed Toprol.   -bp controlled 7/12 9.  Hypothyroidism.  ContinuevSynthroid. 10.  H. pylori.  Treatment initiated 6/24.  Amoxicillin 1 g, Biaxin 500 mg twice daily, PPI twice daily x14 days total 11.  UTI/ESBL/E. Coli/urinary  retention.  Contact precautions.  Patient completed course of fosfomycin.  Foley discontinued             -UC with no growth.              -continue trial of flomax, urecholine   -to bsc or toilet to void  q2H voiding 12.  Obesity.  BMI 31.28.  Dietary follow-up 13.  CKD stage III.    Nursing order placed to encourage 6-8 glasses of water per day.   -7/11 Cr down to 0.9 today 14.  Tobacco abuse.  Continue counseling 15. Hypokalemia: klor 9/12 supplemented, resolved.  16. Vitamin B1 deficiency: continue thiamin 100mg  daily 17. Insomnia: continue amitriptyline 10mg  HS 18. Tachycardia: continue to monitor TID     LOS: 11 days A FACE TO FACE EVALUATION  WAS PERFORMED  Horton Chin 05/28/2021, 12:32 PM

## 2021-05-28 NOTE — Patient Care Conference (Signed)
Inpatient RehabilitationTeam Conference and Plan of Care Update Date: 05/28/2021   Time: 1:42 PM    Patient Name: Diane Todd      Medical Record Number: [id number]  Date of Birth: July 03, 2001 Sex: Female         Room/Bed: 5C07C/5C07C-01 Payor Info: Payor: [place] MEDICAID PREPAID HEALTH PLAN / Plan: [place] MEDICAID [facility] / Product Type: *No Product type* /    Admit Date/Time:  05/17/2021  4:54 PM  Primary Diagnosis:  Neuromyelitis optica (devic) [facility])  Hospital Problems: Principal Problem:   Neuromyelitis optica (devic) [facility])    Expected Discharge Date: Expected Discharge Date: 06/05/21  Team Members Present: Physician leading conference: Dr. Sula Soda Care Coodinator Present: Dossie Der, LCSW;Leibish Mcgregor Marlyne Beards, RN, BSN, CRRN Nurse Present: Kennyth Arnold, RN PT Present: Raechel Chute, PT OT Present: Rosalio Loud, OT SLP Present: Colin Benton, SLP     Current Status/Progress Goal Weekly Team Focus  Bowel/Bladder   continent to B/B, last BM 05/26/21  remain continent  Assess Q shift and PRN   Swallow/Nutrition/ Hydration             ADL's   CGA - Min assist bathing for standing balance, Setup UB dressing, CGA - Min A LB dressing with use of figure 4, completing sit > stand min assist for elevated height up to Mod assist from lower surfaces.  Continues to be anxious with mobility  upgraded to supervision sit > stand and dynamic standing balance; CGA bathing, LB dressing, and toilet transfers  ADL retraining, transfers, activity tolerance/endurance, generalized strengthening, emotional support   Mobility   bed mobility supervision, transfers with RW min A, gait 134ft with RW CGA, 4 steps with 2 rails min A  supervision, CGA stairs  functional mobility/transfers, generalized strengthening, dynamic standing balance/coordination, gait training, NMR, self efficacy, discharge planning, and improved activity tolerance.   Communication             Safety/Cognition/ Behavioral  Observations  Sup-to-Min A for problem solving, recall - patient reports feeling like she is limited by her anxiety  Mod I, supervision A recall  mildly complex problem solving, working memory, short-term recall, Banker for compensation.   Pain   no c/o pain or any discomfort  remain pain freee  Assess pain Q shift and PRN   Skin   mild red to bottom, no new open skin  skin remain intact  Assess skin Q shift and PRN, support with pollow and encourage patient for reposition.     Discharge Planning:  Sister here daily and observes in therapies and participates when appropriate. Needs OP due to young age and would benefit more from see if family can provide transport   Team Discussion: Request time toileting every 2 hours, even at home. Patient has anxiety and insomnia. Patient is continent B/B, no pain reported. No issues falling asleep but has issues staying asleep. Patient on target to meet rehab goals: yes, Needs lots of encouragement, walked > 150 ft with RW. Supervision goals, contact guard for stairs. Contact guard overall for bathing and dressing, supervision to stand. Contact guard lower body bathing/dressing. Will need and  3N1, and a tub bench. SLP reports she is limited by anxiety, memory. Working on mildly complex problem solving and memory. 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   *See Care Plan and progress notes for long and short-term goals.   Revisions to Treatment Plan:  MD adjusting medications.  Teaching Needs:  Family education, medication management, bowel/bladder management, BP management, transfer training, gait training, balance training, endurance training, stair training, safety awareness.  Current Barriers to Discharge: Decreased caregiver support, Medical stability, Home enviroment access/layout, Neurogenic bowel and bladder, Lack of/limited family support, Weight, Medication compliance, and Behavior  Possible  Resolutions to Barriers: Continue current medications, provide emotional support.     Medical Summary Current Status: insomnia, urinary retention, tachycardia, BP now stable, anxiety, obesity BMI 32.73, thiamine deficiency  Barriers to Discharge: Medical stability;Behavior  Barriers to Discharge Comments: insomnia, urinary retention, tachycardia, BP now stable, anxiety, obesity BMI 32.73, thiamine deficiency Possible Resolutions to [employer] Focus: increase amitriptyline to 25mg , continue bethanacol and flomax and wean as tolerated, d/c toprol, provide dietary education, continue thiamine supplement   Continued Need for Acute Rehabilitation Level of Care: The patient requires daily medical management by a physician with specialized training in physical medicine and rehabilitation for the following reasons: Direction of a multidisciplinary physical rehabilitation program to maximize functional independence : Yes Medical management of patient stability for increased activity during participation in an intensive rehabilitation regime.: Yes Analysis of laboratory values and/or radiology reports with any subsequent need for medication adjustment and/or medical intervention. : Yes   I attest that I was present, lead the team conference, and concur with the assessment and plan of the team.   05/28/2021, 1:47 PM

## 2021-05-28 NOTE — Progress Notes (Signed)
Physical Therapy Session Note  Patient Details  Name: Diane Todd MRN: [id number] Date of Birth: 2001-08-11  Today's Date: 05/28/2021 PT Individual Time: 1443-1540 PT Individual Time Calculation (min): 54 min   Short Term Goals: Week 1:  PT Short Term Goal 1 (Week 1): Pt will perform bed mobility consistently with MinA PT Short Term Goal 1 - Progress (Week 1): Met PT Short Term Goal 2 (Week 1): Pt will perform sit to stands with consistently with minA PT Short Term Goal 2 - Progress (Week 1): Progressing toward goal PT Short Term Goal 3 (Week 1): Pt will perform stand pivot transfers consistently with minA PT Short Term Goal 3 - Progress (Week 1): Met PT Short Term Goal 4 (Week 1): Pt will complete 4 steps using 3 inch step with ModA, BUE support on rails in order to reach LTG PT Short Term Goal 4 - Progress (Week 1): Met Week 2:  PT Short Term Goal 1 (Week 2): Pt will perform sit to stands with consistently with minA PT Short Term Goal 2 (Week 2): Pt will perform bed<>chair transfers with LRAD and CGA PT Short Term Goal 3 (Week 2): Pt will perfom car transfer with LRAD and CGA  Skilled Therapeutic Interventions/Progress Updates:   Received pt semi-reclined in bed with sister present at bedside, pt agreeable to PT treatment, and denied any pain during session. Session with emphasis on functional mobility/transfers, toileting, generalized strengthening, dynamic standing balance/coordination, gait training, and improved activity tolerance. Pt transferred semi-reclined<>sitting EOB with supervision and use of bedrails and donned shoes with supervision. Pt reported urge to void and ambulated 48f with RW and CGA to bathroom. Pt able to manage clothing with CGA and void minimally (RN and MD aware). Pt required total A for peri-care in standing but able to pull up underwear/pants with CGA. Pt ambulated to sink and stood and washed hands and brushed teeth with UE support on countertop and CGA  for balance. MD present for morning rounds and then [facility]. Sit<>stand with min A x 2 trials and ambulated 2064fx 1 and 16335f 1 with RW and CGA. Pt demonstrates improvements in LE ataxia with only mild unsteadiness but noticeable hip drop due to glute weakness. Pt performed the following activities at BITS standing with 1 UE support on RW and CGA for balance for 5 minutes: -Geoboard pattern replication x 3 trials with 100% accuracy but dysmetria noted bilaterally -Visual scanning single target with emphasis on reaction time and fine motor control using BUEs with 83% accuracy Pt requested to sit due to fatigue demonstrated by visible quad trembling. [facility] and challenged pt with memory recalling events from session. Con[facility] session [facility], needs within reach, and chair pad alarm on. Sister present at bedside.   Therapy Documentation Precautions:  Precautions Precautions: Fall Precaution Comments: contact precautions Restrictions Weight Bearing Restrictions: No   Therapy/Group: Individual Therapy AnnAlfonse Alpers, DPT   05/28/2021, 7:37 AM

## 2021-05-28 NOTE — Progress Notes (Signed)
Occupational Therapy Session Note  Patient Details  Name: Diane Todd MRN: [id number] Date of Birth: Oct 04, 2001  Today's Date: 05/28/2021 OT Individual Time: 9326-7124 and 1302-1330 OT Individual Time Calculation (min): 42 min and 28 min   Short Term Goals: Week 2:  OT Short Term Goal 1 (Week 2): Pt will complete toilet transfer with min assist with LRAD OT Short Term Goal 2 (Week 2): Pt will complete LB dressing with supervision 3/4 times for increased endurance and balance OT Short Term Goal 3 (Week 2): Pt will complete 2 grooming tasks in standing for increased balance and activity tolerance  Skilled Therapeutic Interventions/Progress Updates:    1) Treatment session with focus on fine and gross motor control and dynamic standing balance.  Pt received upright in w/c declining bathing this session.  Pt transported to therapy gym and completed stand pivot transfer to therapy mat with min assist.  Engaged in box and blocks assessment with pt completing R: 35 and L: 36 in 1 minute time frame with noted decreased motor control and grasp of 1" blocks.  Pt completed block building with pattern replication from seated position with focus on BUE motor control.  Increased challenge to completing task in standing, incorporating reaching to R and L to increase dynamic standing challenge and balance.  Pt required min assist sit > stand and CGA during dynamic standing balance due to BLE instability and pt anxiety during standing.  Pt demonstrating increased "shakiness" in B hands with fatigue and increased anxiety with perceived errors. Pt returned to room and remained upright in w/c with chair alarm on and all needs in reach, sister present.  2) Treatment session with focus on fine and gross motor control and in-hand manipulation and translation.  Pt received upright in w/c agreeable to therapy session.  Engaged in Connect 4 activity with focus on fine motor control with pinch grip progressing to  in-hand manipulation and translation.  Pt demonstrating decreased coordination L > R but able to complete activity with increased time.  Pt continues to be anxious with increased challenge, benefiting from encouragement and reassurance.  Engaged in grasp and release in all quadrants with use of resistive clothespins with focusing on grasp and gross motor control during reaching.  Pt remained upright in w/c at end of session, chair alarm on and all needs in reach.  Therapy Documentation Precautions:  Precautions Precautions: Fall Precaution Comments: contact precautions Restrictions Weight Bearing Restrictions: No  Pain: Pain Assessment Pain Scale: 0-10 Pain Score: 0-No pain Faces Pain Scale: No hurt   Therapy/Group: Individual Therapy  Rosalio Loud 05/28/2021, 12:17 PM

## 2021-05-28 NOTE — Progress Notes (Signed)
Patient ID: Diane Todd, female   DOB: 2000/11/26, 20 y.o.   MRN: [id number] Met with pt and sister to inform of team conference progress toward her goals and upgraded some goals. Will start including sister for education so both feel prepared for discharge on 7/20. Discussed OP therapies and they are agreeable to this. Will work on discharge needs.

## 2021-05-29 NOTE — Progress Notes (Signed)
Speech Language Pathology Daily Session Note  Patient Details  Name: Diane Todd MRN: [id number] Date of Birth: 2001-06-30  Today's Date: 05/29/2021 SLP Individual Time: 3825-0539 SLP Individual Time Calculation (min): 55 min  Short Term Goals: Week 2: SLP Short Term Goal 1 (Week 2): Pt will demonstrate problem solving skills in mildly complex tasks with sup A verbal cues. SLP Short Term Goal 2 (Week 2): Pt will demonstrate recall of novel, daily events with sup-to-min A verbal cues. SLP Short Term Goal 3 (Week 2): Pt will self-monitor and self-correct functional errors with sup A verbal cues. SLP Short Term Goal 4 (Week 2): Pt will demonstrate recall of novel, daily events with use of written aid given supervision A verbal cues.  Skilled Therapeutic Interventions: Pt seen for skilled ST with focus on cognitive skills, sister present and very helpful throughout tx. Pt expressing mild frustration with remaining difficulties with memory, especially during hospital stay, benefiting from min A cues for general timeline of events for last 4 weeks. SLP facilitating mildly complex working memory task by providing min A cues for 100% accuracy. SLP, pt and sister playing [place] game, initially without distraction with increasing auditory and visual distractions introduced. Pt able to self monitor and self correct with Supervision cues. Pt encouraged to continue mental stimulating tasks with sister throughout day. Pt left in wheelchair with all needs met. Cont ST POC.  Pain Pain Assessment Pain Scale: 0-10 Pain Score: 0-No pain Faces Pain Scale: No hurt  Therapy/Group: Individual Therapy  Dewaine Conger 05/29/2021, 10:53 AM

## 2021-05-29 NOTE — Progress Notes (Signed)
Recreational Therapy Session Note  Patient Details  Name: Diane Todd MRN: [id number] Date of Birth: 2000-12-09 Today's Date: 05/29/2021  Pain: no c/o Skilled Therapeutic Interventions/Progress Updates: Met pt and PT in the dayroom for co-treat session.  Session focused on sit-stands, ambulation using RW, relaxation strategies, discharge planning, activity analysis.  Pt states "fear of the unknown" when discussing discharge home.  Review of deep breathing exercises in preparation for mobility/activities and how to incorporate it into tasks as she is completing them.  Pt ambulated 180' with RW with contact guard assist and practiced sit-stands with contact guard assist x1 and with supervision x2.  Pt very excited about being able to do the above tasks.  Encouraged pt to use memory notebook as a journal as well.  Pt stated agreeable.  Therapy/Group: Co-Treatment  Pratham Cassatt 05/29/2021, 4:06 PM

## 2021-05-29 NOTE — Progress Notes (Signed)
Occupational Therapy Session Note  Patient Details  Name: Diane Todd MRN: [id number] Date of Birth: 17-Mar-2001  Today's Date: 05/29/2021 OT Individual Time: 3614-4315 and 4008-6761 OT Individual Time Calculation (min): 56 min and 30 min   Short Term Goals: Week 2:  OT Short Term Goal 1 (Week 2): Pt will complete toilet transfer with min assist with LRAD OT Short Term Goal 2 (Week 2): Pt will complete LB dressing with supervision 3/4 times for increased endurance and balance OT Short Term Goal 3 (Week 2): Pt will complete 2 grooming tasks in standing for increased balance and activity tolerance  Skilled Therapeutic Interventions/Progress Updates:    1) Treatment session with focus on self-care retraining, functional transfers, and BUE strengthening and coordination.  Pt received upright in bed agreeable to shower.  Pt ambulated from bed to room shower with RW with min assist and min cues for sequencing.  Pt able to step over shower ledge with min cues and min assist.  Pt completed bathing overall supervision when seated, utilizing figure 4 position to wash lower legs.  Pt required CGA for sit > stand and standing balance while washing buttocks with single UE support on grab bar.  Pt completed UB dressing with setup and then returned to EOB to complete LB dressing.  Pt able to don LB clothing with use of figure 4 and then Min assist sit > stand and CGA for standing balance while pulling pants over hips. Pt requested to sit for grooming tasks at sink due to fatigue post shower.  Engaged in BUE strength and coordination with theraputty.  Therapist directed pt in pinch, grip, and intrinsic hand muscle strengthening with putty.  Pt remained upright in w/c with seat belt alarm on and all needs in reach.  2) Treatment session with focus on BUE fine and gross motor control and visual scanning.  Pt received upright in w/c agreeable to therapy session.  Engaged in fine motor control with BUE with  stringing beads, therapist challenging pt to switch hands halfway through.  Pt reports feeling increased motor control with LUE this session.  Discussed handwriting and use of memory notebook to allow for recall as well as practice with handwriting to improve legibility.  Engaged in ball toss with BUE with focus on gross motor control and symmetry with movements.  Therapist educated pt on tossing small ball hand to hand with focus on visual scanning, awareness, and gross motor control.  Simulated with balled up paper towel with pt reporting vision feeling "funny" with increased saccadic eye movements.  Educated on importance of sitting when completing eye movements, at this time, due to balance deficits and reports of dizziness.  Pt transferred back to bed min assist stand pivot and left semi-reclined with all needs in reach.  Therapy Documentation Precautions:  Precautions Precautions: Fall Precaution Comments: contact precautions Restrictions Weight Bearing Restrictions: No General:   Vital Signs: Therapy Vitals Temp: 98.2 F (36.8 C) Temp Source: Oral Pulse Rate: (!) 110 Resp: 17 BP: 107/63 Patient Position (if appropriate): Lying Oxygen Therapy SpO2: 96 % O2 Device: Room Air Pain: Pain Assessment Pain Scale: 0-10 Pain Score: 0-No pain Faces Pain Scale: No hurt   Therapy/Group: Individual Therapy  Rosalio Loud 05/29/2021, 8:09 AM

## 2021-05-29 NOTE — Progress Notes (Signed)
Physical Therapy Session Note  Patient Details  Name: Diane Todd MRN: [id number] Date of Birth: [birth year]  Today's Date: 05/29/2021 PT Individual Time: 0347-4259 PT Individual Time Calculation (min): 45 min   Short Term Goals: Week 1:  PT Short Term Goal 1 (Week 1): Pt will perform bed mobility consistently with MinA PT Short Term Goal 1 - Progress (Week 1): Met PT Short Term Goal 2 (Week 1): Pt will perform sit to stands with consistently with minA PT Short Term Goal 2 - Progress (Week 1): Progressing toward goal PT Short Term Goal 3 (Week 1): Pt will perform stand pivot transfers consistently with minA PT Short Term Goal 3 - Progress (Week 1): Met PT Short Term Goal 4 (Week 1): Pt will complete 4 steps using 3 inch step with ModA, BUE support on rails in order to reach LTG PT Short Term Goal 4 - Progress (Week 1): Met Week 2:  PT Short Term Goal 1 (Week 2): Pt will perform sit to stands with consistently with minA PT Short Term Goal 2 (Week 2): Pt will perform bed<>chair transfers with LRAD and CGA PT Short Term Goal 3 (Week 2): Pt will perfom car transfer with LRAD and CGA  Skilled Therapeutic Interventions/Progress Updates:   Received pt semi-reclined in bed, pt agreeable to PT treatment, and denied any pain during session but reported numbness traveling from bilateral elbows to hands from doing so much activity with her arms earlier today. Session with emphasis on functional mobility/transfers, generalized strengthening, dynamic standing balance/coordination, ambulation and improved activity tolerance. Pt transferred semi-reclined<>sitting EOB with supervision and donned shoes with set up assist. Sit<>stand with RW and min A and stand<>pivot bed<>WC with RW and CGA. Pt transported to dayroom in [facility] total A for time management purposes and recreational therapist joined session. Discussed methods to manage stress/anxiety while still participating in social activities that pt used to  enjoy prior to recent hospital admission. Pt repots being "scared of the unknown" but one of her goals is to go walk around [employer] in [place] with her sister. Discussed various methods to achieve this goal with the understanding that she will have to be willing to try new things. Pt then ambulated 146f with RW and CGA around nurses station with no LOB but generalized unsteadiness. Pt reports she still doesn't feel like she is able to stand up well; therefore focused on blocked practice sit<>stands. Pt able to stand x 1 with CGA and x 2 with supervision throughout remainder of session with verbal cues for hand placement, foot placement, and anterior weight shifting; pt very excited and surprised she was able to do this. [facility] to return to bed reporting she feel "anxious" sitting up by herself when her sister is not around. Stand<>pivot WC<>bed with RW and close supervision and sit<>semi-reclined with supervision. Concluded session with pt semi-reclined in bed, needs within reach, and bed alarm on. Assisted pt with updating memory notebook.   Therapy Documentation Precautions:  Precautions Precautions: Fall Precaution Comments: contact precautions Restrictions Weight Bearing Restrictions: No  Therapy/Group: Individual Therapy AAlfonse AlpersPT, DPT   05/29/2021, 7:27 AM

## 2021-05-29 NOTE — Progress Notes (Signed)
PROGRESS NOTE   Subjective/Complaints: Continues to have pain in bilateral temples Sleeping better at night but does wake at night  ROS: Patient denies fever, rash, sore throat, blurred vision, nausea, vomiting, diarrhea, cough, shortness of breath or chest pain, headache, or mood change.    Objective:   No results found. No results for input(s): WBC, HGB, HCT, PLT in the last 72 hours.  No results for input(s): NA, K, CL, CO2, GLUCOSE, BUN, CREATININE, CALCIUM in the last 72 hours.   Intake/Output Summary (Last 24 hours) at 05/29/2021 0858 Last data filed at 05/29/2021 0840 Gross per 24 hour  Intake 1320 ml  Output --  Net 1320 ml        Physical Exam: Vital Signs Blood pressure 107/63, pulse (!) 110, temperature 98.2 F (36.8 C), temperature source Oral, resp. rate 17, height 5\' 7"  (1.702 m), weight 95.3 kg, SpO2 96 %. Gen: no distress, normal appearing HEENT: oral mucosa pink and moist, NCAT Cardio: Tachycardia Chest: normal effort, normal rate of breathing Abd: soft, non-distended Ext: no edema Psych: pleasant, normal affect  Skin: Clean and intact without signs of breakdown Neuro: alert, oriented to person, place, mo/year. Fair insight and awareness. Blurred vision but diplopia less today, tracking better to all fields. Motor 4- to 4/5 UE's and + to 4/5 LE's. Sensory 1+/2 LE essentially stable.. DTR's 3+ in all 4's.  Musculoskeletal: Full ROM, No pain with AROM or PROM in the neck, trunk, or extremities. Posture appropriate. Both SCM's tender with taut bands palpable left more than right. Pain worsened with rotation of head to either side     Assessment/Plan: 1. Functional deficits which require 3+ hours per day of interdisciplinary therapy in a comprehensive inpatient rehab setting. Physiatrist is providing close team supervision and 24 hour management of active medical problems listed below. Physiatrist and  rehab team continue to assess barriers to discharge/monitor patient progress toward functional and medical goals  Care Tool:  Bathing    Body parts bathed by patient: Right arm, Left arm, Chest, Abdomen, Front perineal area, Right upper leg, Left upper leg, Face, Right lower leg, Left lower leg, Buttocks   Body parts bathed by helper: Buttocks     Bathing assist Assist Level: Contact Guard/Touching assist     Upper Body Dressing/Undressing Upper body dressing   What is the patient wearing?: Pull over shirt, Bra    Upper body assist Assist Level: Set up assist    Lower Body Dressing/Undressing Lower body dressing      What is the patient wearing?: Pants, Underwear/pull up     Lower body assist Assist for lower body dressing: Contact Guard/Touching assist     Toileting Toileting    Toileting assist Assist for toileting: Moderate Assistance - Patient 50 - 74%     Transfers Chair/bed transfer  Transfers assist  Chair/bed transfer activity did not occur: N/A  Chair/bed transfer assist level: Minimal Assistance - Patient > 75% Chair/bed transfer assistive device: assist      Assist level: Contact Guard/Touching assist Assistive device: Walker-rolling Max distance: 239ft   Walk 10 feet activity   Assist  Assist level: Contact Guard/Touching assist Assistive device: Walker-rolling   Walk 50 feet activity   Assist Walk 50 feet with 2 turns activity did not occur: Safety/medical concerns  Assist level: Contact Guard/Touching assist Assistive device: Walker-rolling    Walk 150 feet activity   Assist Walk 150 feet activity did not occur: Safety/medical concerns  Assist level: Contact Guard/Touching assist Assistive device: Walker-rolling    Walk 10 feet on uneven surface  activity   Assist Walk 10 feet on uneven surfaces activity did not occur: Safety/medical concerns          Wheelchair     Assist Will patient use wheelchair at discharge?: Yes Type of Wheelchair: Manual    Wheelchair assist level: Contact Guard/Touching assist Max wheelchair distance: 142ft    Wheelchair 50 feet with 2 turns activity    Assist        Assist Level: Contact Guard/Touching assist   Wheelchair 150 feet activity     Assist      Assist Level: Maximal Assistance - Patient 25 - 49% (pt propels 50 ft (33%))   Blood pressure 107/63, pulse (!) 110, temperature 98.2 F (36.8 C), temperature source Oral, resp. rate 17, height 5\' 7"  (1.702 m), weight 95.3 kg, SpO2 96 %.  Medical Problem List and Plan: 1.  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  Patient received 5 days IV Solu-Medrol/5 treatments and plasmapheresis             -patient may shower             -ELOS/Goals: 14 days, supervision to min assist with PT and OT  -Continue CIR therapies including PT, OT, and SLP     2. Impaired mobility: continue SCDs 3. Pain at bilateral temples/bilateral SCM's: may be secondary to bruxism- discussed with therapy- we do not have mouth guards here.  - increased amitriptyline to 25mg  -reviewed stretches she can do for her neck  -might be worthwhile to have family bring in mouth piece from outside -Discussed using tylenol PRN- this helped.  4. Anxiety: increase amitriptyline to 25mg  HS--seems to help             -antipsychotic agents: N/A 5. Neuropsych: This patient is capable of making decisions on her own behalf. 6. Skin/Wound Care: Routine skin checks 7. Fluids/Electrolytes/Nutrition: Routine in and outs with follow-up chemistries 8.  Hypertension. Resolved, now hypotensive. D/c'ed Toprol.   -bp controlled 7/13, still soft off meds 9.  Hypothyroidism.  ContinuevSynthroid. 10.  H. pylori.  Treatment initiated 6/24.  Amoxicillin 1 g, Biaxin 500 mg twice daily, PPI twice daily x14 days total 11.  UTI/ESBL/E. Coli/urinary retention.  Contact  precautions.  Patient completed course of fosfomycin.  Foley discontinued             -UC with no growth.              -continue trial of flomax, urecholine  7/13 voiding with low pvr's  q2H voiding while awake 12.  Obesity.  BMI 31.28.  Dietary follow-up 13.  CKD stage III.    Nursing order placed to encourage 6-8 glasses of water per day.   -7/11 Cr down to 0.9   14.  Tobacco abuse.  Continue counseling 15. Hypokalemia: klor 7/24 supplemented, resolved.  16. Vitamin B1 deficiency: continue thiamin 100mg  daily 17. Insomnia: continue amitriptyline 10mg  HS 18. Tachycardia: continue to monitor TID     LOS: 12 days A FACE TO FACE EVALUATION WAS PERFORMED  8/13  Ermalene Postin 05/29/2021, 8:58 AM

## 2021-05-30 MED ORDER — PROPRANOLOL HCL 20 MG PO TABS
10.0000 mg | ORAL_TABLET | Freq: Every day | ORAL | Status: DC
  Administered 2021-05-30: 10 mg via ORAL
  Filled 2021-05-30: qty 1

## 2021-05-30 MED ORDER — MUSCLE RUB 10-15 % EX CREA
TOPICAL_CREAM | Freq: Two times a day (BID) | CUTANEOUS | Status: DC
  Administered 2021-06-02: 1 via TOPICAL
  Filled 2021-05-30: qty 85

## 2021-05-30 NOTE — Progress Notes (Signed)
Occupational Therapy Session Note  Patient Details  Name: Diane Todd MRN: [id number] Date of Birth: Mar 04, 2001  Today's Date: 05/30/2021 OT Individual Time: 1435-1535 OT Individual Time Calculation (min): 60 min    Short Term Goals: Week 2:  OT Short Term Goal 1 (Week 2): Pt will complete toilet transfer with min assist with LRAD OT Short Term Goal 2 (Week 2): Pt will complete LB dressing with supervision 3/4 times for increased endurance and balance OT Short Term Goal 3 (Week 2): Pt will complete 2 grooming tasks in standing for increased balance and activity tolerance  Skilled Therapeutic Interventions/Progress Updates:    Pt in wheelchair to start session with transport down to the therapy gym via wheelchair.  She completed stand pivot to the therapy mat with min guard assist using the RW for support.  Worked on BUE strengthening and coordination during session.  She was able to complete shoulder flexion with use of a 4 lb dowel rod for 2 sets of 10.  She then completed 2 sets of 15 for elbow flexion.  Noted pt with no significant dyspnea during and post exercises with O2 at 98% on room air but HR up in the mid to upper 130's.  Even after 3-4 mins of rest it would remain above 130.  Had her complete tasks in sitting secondary to this with completion of balloon toss and catch, balloon volleyball with use of the dowel rod, and placement and removal of resistive clothespins with each UE with 1.5 lb wrist weight in place.  HR remained between 140 and 130 throughout with pt voicing no significant discomfort, pain, or fatigue.  Finished session with return to the wheelchair at min guard assist stand pivot without an assistive device and then back to the bed at the same level once reaching the room.  She was able to remove shoes with setup and transition to supine at the same level.  Call button and phone in reach with family member present.  HR in bed 110-112 with nursing made aware of the  increase with therapy.    Therapy Documentation Precautions:  Precautions Precautions: Fall Precaution Comments: contact precautions Restrictions Weight Bearing Restrictions: No  Pain: Pain Assessment Pain Scale: Faces Pain Score: 0-No pain ADL: See Care Tool Section for some details of mobility and selfcare   Therapy/Group: Individual Therapy  Jerzy Roepke OTR/L 05/30/2021, 4:04 PM

## 2021-05-30 NOTE — Progress Notes (Signed)
PROGRESS NOTE   Subjective/Complaints: Sleep and pain in temples improved. Now main is more behind both ears. Discussed trying heating pad and muscle rub and she is agreeable She and sister have no other concerns  ROS: Patient denies fever, rash, sore throat, blurred vision, nausea, vomiting, diarrhea, cough, shortness of breath or chest pain, headache, or mood change. +pain behind both ears   Objective:   No results found. No results for input(s): WBC, HGB, HCT, PLT in the last 72 hours.  No results for input(s): NA, K, CL, CO2, GLUCOSE, BUN, CREATININE, CALCIUM in the last 72 hours.   Intake/Output Summary (Last 24 hours) at 05/30/2021 1355 Last data filed at 05/29/2021 2110 Gross per 24 hour  Intake 340 ml  Output --  Net 340 ml        Physical Exam: Vital Signs Blood pressure 124/72, pulse (!) 109, temperature 99.4 F (37.4 C), temperature source Oral, resp. rate 17, height 5\' 7"  (1.702 m), weight 93.9 kg, SpO2 100 %. Gen: no distress, normal appearing HEENT: oral mucosa pink and moist, NCAT Cardio: Tachycardic Chest: normal effort, normal rate of breathing Abd: soft, non-distended Ext: no edema Psych: pleasant, normal affect   Skin: Clean and intact without signs of breakdown Neuro: alert, oriented to person, place, mo/year. Fair insight and awareness. Blurred vision but diplopia less today, tracking better to all fields. Motor 4- to 4/5 UE's and + to 4/5 LE's. Sensory 1+/2 LE essentially stable.. DTR's 3+ in all 4's.  Musculoskeletal: Full ROM, No pain with AROM or PROM in the neck, trunk, or extremities. Posture appropriate. Both SCM's tender with taut bands palpable left more than right. Pain worsened with rotation of head to either side     Assessment/Plan: 1. Functional deficits which require 3+ hours per day of interdisciplinary therapy in a comprehensive inpatient rehab setting. Physiatrist is  providing close team supervision and 24 hour management of active medical problems listed below. Physiatrist and rehab team continue to assess barriers to discharge/monitor patient progress toward functional and medical goals  Care Tool:  Bathing    Body parts bathed by patient: Right arm, Left arm, Chest, Abdomen, Front perineal area, Right upper leg, Left upper leg, Face, Right lower leg, Left lower leg, Buttocks   Body parts bathed by helper: Buttocks     Bathing assist Assist Level: Contact Guard/Touching assist     Upper Body Dressing/Undressing Upper body dressing   What is the patient wearing?: Pull over shirt, Bra    Upper body assist Assist Level: Set up assist    Lower Body Dressing/Undressing Lower body dressing      What is the patient wearing?: Pants, Underwear/pull up     Lower body assist Assist for lower body dressing: Contact Guard/Touching assist     Toileting Toileting    Toileting assist Assist for toileting: Moderate Assistance - Patient 50 - 74%     Transfers Chair/bed transfer  Transfers assist  Chair/bed transfer activity did not occur: N/A  Chair/bed transfer assist level: Contact Guard/Touching assist Chair/bed transfer assistive device:   Ambulation assist      Assist level: Contact Guard/Touching assist  Assistive device: Walker-rolling Max distance: 175ft   Walk 10 feet activity   Assist     Assist level: Contact Guard/Touching assist Assistive device: Walker-rolling   Walk 50 feet activity   Assist Walk 50 feet with 2 turns activity did not occur: Safety/medical concerns  Assist level: Contact Guard/Touching assist Assistive device: Walker-rolling    Walk 150 feet activity   Assist Walk 150 feet activity did not occur: Safety/medical concerns  Assist level: Contact Guard/Touching assist Assistive device: Walker-rolling    Walk 10 feet on uneven surface   activity   Assist Walk 10 feet on uneven surfaces activity did not occur: Safety/medical concerns         Wheelchair     Assist Will patient use wheelchair at discharge?: Yes Type of Wheelchair: Manual    Wheelchair assist level: Contact Guard/Touching assist Max wheelchair distance: 138ft    Wheelchair 50 feet with 2 turns activity    Assist        Assist Level: Contact Guard/Touching assist   Wheelchair 150 feet activity     Assist      Assist Level: Maximal Assistance - Patient 25 - 49% (pt propels 50 ft (33%))   Blood pressure 124/72, pulse (!) 109, temperature 99.4 F (37.4 C), temperature source Oral, resp. rate 17, height 5\' 7"  (1.702 m), weight 93.9 kg, SpO2 100 %.  Medical Problem List and Plan: 1.  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  Patient received 5 days IV Solu-Medrol/5 treatments and plasmapheresis             -patient may shower             -ELOS/Goals: 14 days, supervision to min assist with PT and OT  -Continue CIR therapies including PT, OT, and SLP  2. Impaired mobility: continue SCDs 3. Pain at bilateral temples/bilateral SCM's: may be secondary to bruxism- discussed with therapy- we do not have mouth guards here.  - increased amitriptyline to 25mg  -reviewed stretches she can do for her neck  -might be worthwhile to have family bring in mouth piece from outside -Discussed using tylenol PRN- this helped.  -add heating pad and muscle rub 4. Anxiety: increase amitriptyline to 25mg  HS--seems to help             -antipsychotic agents: N/A 5. Neuropsych: This patient is capable of making decisions on her own behalf. 6. Skin/Wound Care: Routine skin checks 7. Fluids/Electrolytes/Nutrition: Routine in and outs with follow-up chemistries 8.  Hypertension. Resolved, now hypotensive. D/c'ed Toprol.   -bp controlled 7/13, still soft off meds 9.  Hypothyroidism.  ContinuevSynthroid. 10.  H. pylori.  Treatment  initiated 6/24.  Amoxicillin 1 g, Biaxin 500 mg twice daily, PPI twice daily x14 days total 11.  UTI/ESBL/E. Coli/urinary retention.  Contact precautions.  Patient completed course of fosfomycin.  Foley discontinued             -UC with no growth.              -continue trial of flomax, urecholine  7/14 voiding with low pvr's  q2H voiding while awake 12.  Obesity.  BMI 31.28.  Dietary follow-up 13.  CKD stage III.    Nursing order placed to encourage 6-8 glasses of water per day.   -7/11 Cr down to 0.9   14.  Tobacco abuse.  Continue counseling 15. Hypokalemia: klor 7/24 supplemented, resolved.  16. Vitamin B1 deficiency: continue thiamin 100mg  daily 17. Insomnia: continue amitriptyline 10mg  HS  18. Tachycardia: continue to monitor TID. Add propanolol 10mg  to help with anxiety and HR.     LOS: 13 days A FACE TO FACE EVALUATION WAS PERFORMED  P Lerae Langham 05/30/2021, 1:55 PM

## 2021-05-30 NOTE — Progress Notes (Signed)
Slept for approximately 6 hours. Denies pain. Reports that her vision is still blurry at times, however it is improving. Pt voided x2. Bladder scan performed this morning, with volume result= 0 ml. Sister at bedside.

## 2021-05-30 NOTE — Progress Notes (Signed)
Physical Therapy Weekly Progress Note  Patient Details  Name: Diane Todd MRN: [id number] Date of Birth: [birth year]  Beginning of progress report period: May 18, 2021 End of progress report period: May 30, 2021  Today's Date: 05/30/2021 PT Individual Time: 6073-7106 PT Individual Time Calculation (min): 55 min   Patient has met 3 of 3 short term goals. Pt demonstrates steady progress towards long term goals. Pt is currently able to perform bed mobility with supervision, transfers with RW and CGA, ambulate >159f with RW and CGA/supervision, and navigate 8 steps with 1 rail and min A. Pt continues to be limited by anxiety, fear of falling, memory deficits, generalized weakness and deconditioning, and decreased balance/postural control. Plan to begin family education training end of this week/beginning of next.   Patient continues to demonstrate the following deficits muscle weakness, decreased cardiorespiratoy endurance, impaired timing and sequencing, unbalanced muscle activation, decreased coordination, and decreased motor planning, decreased problem solving and decreased memory, and decreased standing balance, decreased postural control, and decreased balance strategies and therefore will continue to benefit from skilled PT intervention to increase functional independence with mobility.   Patient progressing toward long term goals..  Continue plan of care.  PT Short Term Goals Week 2:  PT Short Term Goal 1 (Week 2): Pt will perform sit to stands with consistently with minA PT Short Term Goal 1 - Progress (Week 2): Met PT Short Term Goal 2 (Week 2): Pt will perform bed<>chair transfers with LRAD and CGA PT Short Term Goal 2 - Progress (Week 2): Met PT Short Term Goal 3 (Week 2): Pt will perfom car transfer with LRAD and CGA PT Short Term Goal 3 - Progress (Week 2): Met Week 3:  PT Short Term Goal 1 (Week 3): STG=LTG due to LOS  Skilled Therapeutic Interventions/Progress Updates:   Ambulation/gait training;Discharge planning;Functional mobility training;Psychosocial support;Therapeutic Activities;Visual/perceptual remediation/compensation;Balance/vestibular training;Disease management/prevention;Neuromuscular re-education;Skin care/wound management;Wheelchair propulsion/positioning;Therapeutic Exercise;Cognitive remediation/compensation;DME/adaptive equipment instruction;Pain management;Splinting/orthotics;UE/LE Strength taining/ROM;Community reintegration;Functional electrical stimulation;Stair training;Patient/family education;UE/LE Coordination activities   Today's Interventions: Received pt long sitting in bed with sister present at bedside, pt agreeable to PT treatment, and denied any pain during session. Session with emphasis on functional mobility/transfers, generalized strengthening, dynamic standing balance/coordination, gait training, stair navigation, and improved activity tolerance. Donned shoes with set up assist in figure 4 position and transferred long sitting<>short sitting EOB with supervision. Pt then transferred bed<>WC with RW and close supervision! [facility] management purposes. Pt navigated 8 steps with R handrail and CGA/min A using a lateral stepping technique to simulate home environment. Pt initially hesitant however upon completing task reported that it "wasn't as bad as she thought". Pt required 4 attempts to stand with RW with cues for anterior weight shifting (but able to stand with supervision) then ambulated 1887fwith RW and close supervision with generalized unsteadiness but no LOB noted. Pt ambulated additional 3080fith RW and supervision to mat and worked on blocked practice sit<>stands without RW  x 5 reps with CGA/close supervision -pt relying on mat for posterior leg support. Pt required increased time in between eah rep to gain motivation but pleased with her progress stating "I can't believe I did it".  Stand<>pivot mat<>WC with RW and supervision. [facility] and concluded session with pt sitting in WC, needs within reach, and chair pad alarm on awaiting OT session. Assisted pt with updating memory notebook.  Therapy Documentation Precautions:  Precautions Precautions: Fall Precaution Comments: contact precautions Restrictions Weight Bearing Restrictions: No  Therapy/Group: Individual Therapy Alfonse Alpers PT, DPT  05/30/2021, 7:39 AM

## 2021-05-30 NOTE — Progress Notes (Signed)
Occupational Therapy Session Note  Patient Details  Name: Diane Todd MRN: [id number] Date of Birth: May 04, 2001  Today's Date: 05/30/2021 OT Individual Time: 0915-1000 OT Individual Time Calculation (min): 45 min    Short Term Goals:  Week 2:  OT Short Term Goal 1 (Week 2): Pt will complete toilet transfer with min assist with LRAD OT Short Term Goal 2 (Week 2): Pt will complete LB dressing with supervision 3/4 times for increased endurance and balance OT Short Term Goal 3 (Week 2): Pt will complete 2 grooming tasks in standing for increased balance and activity tolerance  Skilled Therapeutic Interventions/Progress Updates:    Pt received in room in bed with family present and consented to OT tx. Pt able to don footwear EOB with setup. Pt req min A to power up from EOB to RW and CGA for SPT into w/c. Pt taken down to therapy gym and instructed in standing tolerance task. Pt instructed in 24 piece jumbo jigsaw puzzle to work on standing tolerance, Diane Todd, Diane Todd, and problem solving. Pt able to complete puzzle while standing at elevated table with CGA, time, and min cues for accuracy. After puzzle task, pt sat back down and instructed in grasp activity with clothespins of various resistances to increase intrinsic hand strength for ADLs. After tx, pt helped back to room, transferred with RW SPT with min A back to bed and left with all needs met, family at bedside.  Therapy Documentation Precautions:  Precautions Precautions: Fall Precaution Comments: contact precautions Restrictions Weight Bearing Restrictions: No   Vital Signs: Therapy Vitals Temp: 98.1 F (36.7 C) Temp Source: Oral Pulse Rate: 92 Resp: 18 BP: 93/62 Patient Position (if appropriate): Lying Oxygen Therapy SpO2: 99 % O2 Device: Room Air Pain: none     Therapy/Group: Individual Therapy  Cainen Burnham 05/30/2021, 7:48 AM

## 2021-05-30 NOTE — Progress Notes (Signed)
Occupational Therapy Session Note  Patient Details  Name: Diane Todd MRN: [id number] Date of Birth: 2001-02-04  Today's Date: 05/30/2021 OT Individual Time: 1045-1130 OT Individual Time Calculation (min): 45 min    Short Term Goals: Week 2:  OT Short Term Goal 1 (Week 2): Pt will complete toilet transfer with min assist with LRAD OT Short Term Goal 2 (Week 2): Pt will complete LB dressing with supervision 3/4 times for increased endurance and balance OT Short Term Goal 3 (Week 2): Pt will complete 2 grooming tasks in standing for increased balance and activity tolerance  Skilled Therapeutic Interventions/Progress Updates:    Pt seen this session with a cotx with Recreation Therapy to focus on functional mobility with deep breathing, visualizations and strategies to help pt feel in control of her movement.  RT and OT together used strategies with pt to help her with building her confidence, relaxing her HR/breathing, recording when she does do things successfully to keep reminders to herself.  The entire session was focused on developing pt's confidence with sit to stands by using visualization, breaking task down into steps (allowing her to feel more confident),  and having pt practice giving instructions to self direct her care to her sister.  Overall, pt did extremely well and was able to work through her anxiety.  She was able to rise to stand over 10 x with LIGHT CGA.  The CGA was more to help the pt to feel safe.    Pt seemed relieved that she was able to do so many well.  Pt resting in bed with all needs met.    Therapy Documentation Precautions:  Precautions Precautions: Fall Precaution Comments: contact precautions Restrictions Weight Bearing Restrictions: No   Pain: Pain Assessment Pain Score: 0-No pain ADL: ADL Eating: Set up Where Assessed-Eating: Bed level Grooming: Minimal assistance Where Assessed-Grooming: Edge of bed Upper Body Bathing: Minimal  assistance Where Assessed-Upper Body Bathing: Edge of bed Lower Body Bathing: Moderate assistance Where Assessed-Lower Body Bathing: Edge of bed Upper Body Dressing: Supervision/safety Where Assessed-Upper Body Dressing: Edge of bed Lower Body Dressing: Maximal assistance Where Assessed-Lower Body Dressing: Edge of bed Toileting: Maximal assistance, Dependent (bed level) Where Assessed-Toileting: Bed level Toilet Transfer: Not assessed Tub/Shower Transfer: Not assessed Gaffer Transfer: Not assessed   Therapy/Group: Individual Therapy  [place] 05/30/2021, 12:12 PM

## 2021-05-30 NOTE — Progress Notes (Signed)
Recreational Therapy Session Note  Patient Details  Name: Sicilia Killough MRN: [id number] Date of Birth: 2001-05-20 Today's Date: 05/30/2021  Pain: no c/o Skilled Therapeutic Interventions/Progress Updates: Session focused on anxiety management during functional tasks during cotreat with OT.  Pt utilized deep breathing exercises with min instructional cues during sit-stands.  Also encouraged visualization to assist with activity.  Sit-stands were broken down into simple steps coupled with visualization to assist in development of self confidence and anxiety management.  Pt performed ~10 sit-stands with contact guard assist.  Contact guard assist provided for more for pt c/o anxiety vs need for physical assistance.  Pts sister was present & participatory throughout session, educated on the above techniques and assisted pt with sit stand as well.  Encouraged pt to use memory notebook for memory but also as a journal to recognize and celebrate successes and build confidence.  Pt & sister stated understanding.  Therapy/Group: Co-Treatment  Demetrios Byron 05/30/2021, 3:30 PM

## 2021-05-31 MED ORDER — PROPRANOLOL HCL 20 MG PO TABS
10.0000 mg | ORAL_TABLET | Freq: Every day | ORAL | Status: DC
  Administered 2021-05-31 – 2021-06-05 (×6): 10 mg via ORAL
  Filled 2021-05-31 (×6): qty 1

## 2021-05-31 MED ORDER — PROPRANOLOL HCL 20 MG PO TABS: 10.0000 mg | ORAL_TABLET | Freq: Two times a day (BID) | ORAL | Status: DC

## 2021-05-31 NOTE — Progress Notes (Signed)
Slept for 7 hours throughout the night. Pt utilized KPAD throughout the night, placed behind head. Pt reports some relief of pain. Sister at bedside. HR noted increased in 120s immediately after ambulating to bathroom, with manual recheck of 109 after approximately 15 minutes. Pt asymptomatic. T-99.5.

## 2021-05-31 NOTE — Plan of Care (Signed)
  Problem: Consults Goal: RH GENERAL PATIENT EDUCATION Description: See Patient Education module for education specifics. Outcome: Progressing Goal: Nutrition Consult-if indicated Outcome: Progressing   Problem: RH BOWEL ELIMINATION Goal: RH STG MANAGE BOWEL WITH ASSISTANCE Description: STG Manage Bowel with Min Assistance. Outcome: Progressing   Problem: RH BLADDER ELIMINATION Goal: RH STG MANAGE BLADDER WITH ASSISTANCE Description: STG Manage Bladder With Min Assistance Outcome: Progressing   Problem: RH SKIN INTEGRITY Goal: RH STG MAINTAIN SKIN INTEGRITY WITH ASSISTANCE Description: STG Maintain Skin Integrity With Min Assistance. Outcome: Progressing   Problem: RH SAFETY Goal: RH STG ADHERE TO SAFETY PRECAUTIONS W/ASSISTANCE/DEVICE Description: STG Adhere to Safety Precautions With Supervision Assistance/Device. Outcome: Progressing   Problem: RH KNOWLEDGE DEFICIT GENERAL Goal: RH STG INCREASE KNOWLEDGE OF SELF CARE AFTER HOSPITALIZATION Description: Patient will demonstrate knowledge of medication management, safety precautions, and HTN management with educational materials and handouts provided by staff independently at discharge. Outcome: Progressing

## 2021-05-31 NOTE — Progress Notes (Signed)
PROGRESS NOTE   Subjective/Complaints: Has some ongoing neck pain . HR up in 120's with PT this morning. Pt denies any symptoms. Has had low grade temps 99.4-99.7 last 12+ hours  ROS: Patient denies fever, rash, sore throat, blurred vision, nausea, vomiting, diarrhea, cough, shortness of breath or chest pain, joint or back pain, headache, or mood change.    Objective:   No results found. No results for input(s): WBC, HGB, HCT, PLT in the last 72 hours.  No results for input(s): NA, K, CL, CO2, GLUCOSE, BUN, CREATININE, CALCIUM in the last 72 hours.   Intake/Output Summary (Last 24 hours) at 05/31/2021 1133 Last data filed at 05/30/2021 2130 Gross per 24 hour  Intake 120 ml  Output --  Net 120 ml        Physical Exam: Vital Signs Blood pressure 112/76, pulse (!) 109, temperature 99.5 F (37.5 C), temperature source Oral, resp. rate 17, height 5\' 7"  (1.702 m), weight 94.7 kg, SpO2 98 %. Constitutional: No distress . Vital signs reviewed. HEENT: EOMI, oral membranes moist Neck: supple Cardiovascular: tachycardic     Respiratory/Chest: CTA Bilaterally without wheezes or rales. Normal effort    GI/Abdomen: BS +, non-tender, non-distended Ext: no clubbing, cyanosis, or edema Psych: pleasant and cooperative  Skin: bruising right neck Neuro: alert, oriented to person, place, mo/year. Fair insight and awareness. Blurred vision but diplopia less today, tracking better to all fields. Motor 4- to 4/5 UE's and + to 4/5 LE's. Sensory 1+/2 LE essentially stable.. DTR's 3+ in all 4's.  Musculoskeletal: Full ROM, No pain with AROM or PROM in the neck, trunk, or extremities. Posture appropriate. SCM's less tender today, moving neck more freely     Assessment/Plan: 1. Functional deficits which require 3+ hours per day of interdisciplinary therapy in a comprehensive inpatient rehab setting. Physiatrist is providing close team  supervision and 24 hour management of active medical problems listed below. Physiatrist and rehab team continue to assess barriers to discharge/monitor patient progress toward functional and medical goals  Care Tool:  Bathing    Body parts bathed by patient: Right arm, Left arm, Chest, Abdomen, Front perineal area, Right upper leg, Left upper leg, Face, Right lower leg, Left lower leg, Buttocks   Body parts bathed by helper: Buttocks     Bathing assist Assist Level: Supervision/Verbal cueing     Upper Body Dressing/Undressing Upper body dressing   What is the patient wearing?: Pull over shirt, Bra    Upper body assist Assist Level: Set up assist    Lower Body Dressing/Undressing Lower body dressing      What is the patient wearing?: Pants, Underwear/pull up     Lower body assist Assist for lower body dressing: Contact Guard/Touching assist     Toileting Toileting    Toileting assist Assist for toileting: Moderate Assistance - Patient 50 - 74%     Transfers Chair/bed transfer  Transfers assist  Chair/bed transfer activity did not occur: N/A  Chair/bed transfer assist level: Contact Guard/Touching assist Chair/bed transfer assistive device:   Ambulation assist      Assist level: Supervision/Verbal cueing Assistive device: Walker-rolling Max distance: 170ft  Walk 10 feet activity   Assist     Assist level: Supervision/Verbal cueing Assistive device: Walker-rolling   Walk 50 feet activity   Assist Walk 50 feet with 2 turns activity did not occur: Safety/medical concerns  Assist level: Supervision/Verbal cueing Assistive device: Walker-rolling    Walk 150 feet activity   Assist Walk 150 feet activity did not occur: Safety/medical concerns  Assist level: Supervision/Verbal cueing Assistive device: Walker-rolling    Walk 10 feet on uneven surface  activity   Assist Walk 10 feet on uneven surfaces  activity did not occur: Safety/medical concerns         Wheelchair     Assist Will patient use wheelchair at discharge?: Yes Type of Wheelchair: Manual    Wheelchair assist level: Contact Guard/Touching assist Max wheelchair distance: 139ft    Wheelchair 50 feet with 2 turns activity    Assist        Assist Level: Contact Guard/Touching assist   Wheelchair 150 feet activity     Assist      Assist Level: Maximal Assistance - Patient 25 - 49% (pt propels 50 ft (33%))   Blood pressure 112/76, pulse (!) 109, temperature 99.5 F (37.5 C), temperature source Oral, resp. rate 17, height 5\' 7"  (1.702 m), weight 94.7 kg, SpO2 98 %.  Medical Problem List and Plan: 1.  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  Patient received 5 days IV Solu-Medrol/5 treatments and plasmapheresis             -patient may shower             -ELOS/Goals: 14 days, supervision to min assist with PT and OT  -Continue CIR therapies including PT, OT, and SLP      2. Impaired mobility: continue SCDs 3. Pain at bilateral temples/bilateral SCM's: may be secondary to bruxism- discussed with therapy- we do not have mouth guards here.  - increased amitriptyline to 25mg  -continue stretches as we've reviewed and with therapy  -might be worthwhile to have family bring in mouth piece from outside -Discussed using tylenol PRN- this helped.  4. Anxiety: increase amitriptyline to 25mg  HS--seems to help             -antipsychotic agents: N/A 5. Neuropsych: This patient is capable of making decisions on her own behalf. 6. Skin/Wound Care: Routine skin checks 7. Fluids/Electrolytes/Nutrition: Routine in and outs with follow-up chemistries 8.  Hypertension. Resolved, now hypotensive. D/c'ed Toprol.   -bp controlled 7/15 9.  Hypothyroidism.  ContinuevSynthroid. 10.  H. pylori.  Treatment initiated 6/24.  Amoxicillin 1 g, Biaxin 500 mg twice daily, PPI twice daily x14 days total 11.   UTI/ESBL/E. Coli/urinary retention.  Contact precautions.  Patient completed course of fosfomycin.  Foley discontinued             -UC with no growth.              -continue trial of flomax   7/13 voiding with low pvr's---can dc urecholine  q2H voiding while awake  7/15 low grade temp: recheck ua, ucx 12.  Obesity.  BMI 31.28.  Dietary follow-up 13.  CKD stage III.    Nursing order placed to encourage 6-8 glasses of water per day.   -7/11 Cr down to 0.9   14.  Tobacco abuse.  Continue counseling 15. Hypokalemia: klor 8/13 supplemented, resolved.  16. Vitamin B1 deficiency: continue thiamin 100mg  daily 17. Insomnia: continue amitriptyline 10mg  HS 18. Tachycardia: pt is asymptomatic low dose  propranolol added by Dr. Carlis Abbott this morning  -need to avoid hypotension as above--observe today     LOS: 14 days A FACE TO FACE EVALUATION WAS PERFORMED  Ranelle Oyster 05/31/2021, 11:33 AM

## 2021-05-31 NOTE — Progress Notes (Signed)
Recreational Therapy Discharge Summary Patient Details  Name: Diane Todd MRN: [id number] Date of Birth: 09/21/2001 Today's Date: 05/31/2021  Long term goals set: 1  Long term goals met: 1  Comments on progress toward goals: Pt has made good progress during LOS.  TR sessions/education focused on leisure education, activity analysis/identification of adaptations and relaxation training.  Pt is most limited by feelings of anxiety and/or fear.  Extensive education on use of deep breathing exercises prior to mobility tasks, during mobility or leisure tasks and recognizing progress through the use of her memory book/journal.  Pt requires min-mod cues for relaxation training depending on the environment and complexity of requested tasks.  Pt requires contact guard assist for simple mobility tasks, given extra time and encouragement.  Pts sister has been present in the room throughout LOS & educated on TR specific tasks. Goal met.  Reasons goals not met: n/a  Equipment acquired: n/a  Reasons for discharge: treatment goals met Patient/family agrees with progress made and goals achieved: Yes  SIMPSON,LISA 05/31/2021, 9:38 AM   

## 2021-05-31 NOTE — Progress Notes (Addendum)
Recreational Therapy Session Note  Patient Details  Name: Dolorez Jeffrey MRN: [id number] Date of Birth: [birth year] Today's Date: 05/31/2021  Pain: no c/o Skilled Therapeutic Interventions/Progress Updates: Continued discussion today on anxiety management and community reintegration with pt and pt's sister.  Reviewed previous strategy, & provided a handout on deep breathing exercises.  Pt appreciative.  Encouraged pt to add goals to her memory book/journal in an effort to increase positivity/motivation in spite of her feelings of anxiety and to recognize previous progress along the way.  Both stated understanding.   Pt states she is scared to go home, not knowing what to expect. Reviewed current progress and plans for continued therapies once home.  Pt appreciative of session and resources.   Therapy/Group: Individual Therapy  Soniyah Mcglory 05/31/2021, 9:38 AM

## 2021-05-31 NOTE — Progress Notes (Signed)
Occupational Therapy Weekly Progress Note  Patient Details  Name: Diane Todd MRN: [id number] Date of Birth: 12/13/2000  Beginning of progress report period: May 24, 2021 End of progress report period: May 31, 2021  Today's Date: 05/31/2021 OT Individual Time: 1031-1130 OT Individual Time Calculation (min): 59 min    Patient has met 2 of 3 short term goals.  Pt is making steady progress towards goals.  Pt is able to complete sit > stand with supervision - CGA, occasional min assist from lower surfaces.  Pt completes ambulatory transfers with RW with CGA.  Pt is able to complete bathing with supervision seated and CGA to close supervision when standing to wash buttocks. Pt has demonstrated ability to stand for oral care, however continues to request seated position for grooming tasks due to fatigue post shower.  Pt continues to demonstrate increased anxiety with modified tasks and increased challenge, frequently doubting her ability to complete tasks.  Pt's sister is frequently present for therapy sessions to provide support and encouragement, have started family education in preparation for upcoming d/c.  Patient continues to demonstrate the following deficits: muscle weakness, decreased cardiorespiratoy endurance, unbalanced muscle activation, decreased coordination, and decreased motor planning, decreased visual acuity and decreased visual motor skills, decreased problem solving and decreased memory, and decreased standing balance, decreased postural control, and decreased balance strategies and therefore will continue to benefit from skilled OT intervention to enhance overall performance with BADL and Reduce care partner burden.  Patient progressing toward long term goals..  Continue plan of care.  OT Short Term Goals Week 2:  OT Short Term Goal 1 (Week 2): Pt will complete toilet transfer with min assist with LRAD OT Short Term Goal 1 - Progress (Week 2): Met OT Short Term Goal 2  (Week 2): Pt will complete LB dressing with supervision 3/4 times for increased endurance and balance OT Short Term Goal 2 - Progress (Week 2): Met OT Short Term Goal 3 (Week 2): Pt will complete 2 grooming tasks in standing for increased balance and activity tolerance OT Short Term Goal 3 - Progress (Week 2): Progressing toward goal Week 3:  OT Short Term Goal 1 (Week 3): STG = LTGs due to remaining LOS  Skilled Therapeutic Interventions/Progress Updates:    Treatment session with focus on self-care retraining.  Pt received in bed with RN administering meds. Pt agreeable to shower.  Pt completed sit > stand from EOB with supervision and ambulated to room shower with RW with CGA.  Pt completed shower transfer with CGA and good carryover from previous sessions.  Pt completed bathing with distant supervision to allow for pt to initiate tasks without looking for assistance from therapist.  Pt stood to wash buttocks with close supervision but no physical assistance this session with sit > stand or bathing.  Pt donned bra and shirt prior to exiting shower.  Pt then ambulated to EOB to complete LB dressing.  CGA ambulatory transfer back to EOB.  Pt completed LB dressing with setup and supervision when standing to pull underwear and pants over hips.  Pt required min assist during last stand from EOB due to fatigue.  Pt ambulated to sink with RW with CGA and completed grooming tasks in sitting due to fatigue.  Pt remained upright in w/c with all needs in reach.  Sister present throughout session, observing while therapist educated on rationale for various techniques and discussed DME for home.  Therapy Documentation Precautions:  Precautions Precautions: Fall Precaution Comments: contact  precautions Restrictions Weight Bearing Restrictions: No General:   Vital Signs:  Pain:   ADL: ADL Eating: Set up Where Assessed-Eating: Bed level Grooming: Minimal assistance Where Assessed-Grooming: Edge of  bed Upper Body Bathing: Minimal assistance Where Assessed-Upper Body Bathing: Edge of bed Lower Body Bathing: Moderate assistance Where Assessed-Lower Body Bathing: Edge of bed Upper Body Dressing: Supervision/safety Where Assessed-Upper Body Dressing: Edge of bed Lower Body Dressing: Maximal assistance Where Assessed-Lower Body Dressing: Edge of bed Toileting: Maximal assistance, Dependent (bed level) Where Assessed-Toileting: Bed level Toilet Transfer: Not assessed Tub/Shower Transfer: Not assessed Social research officer, government: Not assessed Vision   Perception    Praxis   Exercises:   Other Treatments:     Therapy/Group: Individual Therapy  Simonne Come 05/31/2021, 10:56 AM

## 2021-05-31 NOTE — Progress Notes (Signed)
Occupational Therapy Session Note  Patient Details  Name: Kashena Novitski MRN: [id number] Date of Birth: [birth year]  Today's Date: 05/31/2021 OT Individual Time: 1130-1155 OT Individual Time Calculation (min): 25 min    Short Term Goals: Week 1:  OT Short Term Goal 1 (Week 1): Pt will complete STS in prep for standing ADL with min A + LRAD. OT Short Term Goal 1 - Progress (Week 1): Met OT Short Term Goal 2 (Week 1): Pt will completed toilet transfer with mod A with LRAD. OT Short Term Goal 2 - Progress (Week 1): Met OT Short Term Goal 3 (Week 1): Pt will verbalize 3 energy conservation strategies with min VCs. OT Short Term Goal 3 - Progress (Week 1): Met OT Short Term Goal 4 (Week 1): Pt will don pants with mod A + AE PRN. OT Short Term Goal 4 - Progress (Week 1): Met  Skilled Therapeutic Interventions/Progress Updates:     Pt received in w/c with sister present and no pain reported.  Therapeutic activity Pt completes seated FMC/dexterity activities to improve coordination of BUE. Pt completes palm<>finger translation of small PVC pipe pieces with no grip slips noted. Pt would benefit from challenge of smaller manipulatives. Pt completes 2 graded pipe tree figures seated reaching in min ranges outside seated BOS for improved shoulder ROM/functional reach. Pt with 2 grip slips during activity but no errors with building figure. Educated pt on use of board games to continue to improve BUE coordination at home.  Pt left at end of session in w/c with exit alarm on, call light in reach and all needs met   Therapy Documentation Precautions:  Precautions Precautions: Fall Precaution Comments: contact precautions Restrictions Weight Bearing Restrictions: No General:   Vital Signs: Therapy Vitals Temp: 99.5 F (37.5 C) Temp Source: Oral Pulse Rate: (!) 109 Resp: 17 BP: 112/76 Patient Position (if appropriate): Lying Oxygen Therapy SpO2: 98 % O2 Device: Room Air Pain:    ADL: ADL Eating: Set up Where Assessed-Eating: Bed level Grooming: Minimal assistance Where Assessed-Grooming: Edge of bed Upper Body Bathing: Minimal assistance Where Assessed-Upper Body Bathing: Edge of bed Lower Body Bathing: Moderate assistance Where Assessed-Lower Body Bathing: Edge of bed Upper Body Dressing: Supervision/safety Where Assessed-Upper Body Dressing: Edge of bed Lower Body Dressing: Maximal assistance Where Assessed-Lower Body Dressing: Edge of bed Toileting: Maximal assistance, Dependent (bed level) Where Assessed-Toileting: Bed level Toilet Transfer: Not assessed Tub/Shower Transfer: Not assessed Social research officer, government: Not assessed Vision   Perception    Praxis   Exercises:   Other Treatments:     Therapy/Group: Individual Therapy  Tonny Branch 05/31/2021, 6:51 AM

## 2021-05-31 NOTE — Progress Notes (Signed)
Physical Therapy Session Note  Patient Details  Name: Diane Todd MRN: [id number] Date of Birth: 11/21/2000  Today's Date: 05/31/2021 PT Individual Time: 7858-8502 PT Individual Time Calculation (min): 55 min   Short Term Goals: Week 2:  PT Short Term Goal 1 (Week 2): Pt will perform sit to stands with consistently with minA PT Short Term Goal 1 - Progress (Week 2): Met PT Short Term Goal 2 (Week 2): Pt will perform bed<>chair transfers with LRAD and CGA PT Short Term Goal 2 - Progress (Week 2): Met PT Short Term Goal 3 (Week 2): Pt will perfom car transfer with LRAD and CGA PT Short Term Goal 3 - Progress (Week 2): Met Week 3:  PT Short Term Goal 1 (Week 3): STG=LTG due to LOS  Skilled Therapeutic Interventions/Progress Updates:   Received pt supine in bed asleep, pt easily woken and agreeable to PT treatment, and denied any pain during session. Session with emphasis on family education training, functional mobility/transfers, generalized strengthening, dynamic standing balance/coordination, ambulation, simulated car transfers, stair navigation, and improved activity tolerance. Pt transferred supine<>sitting EOB with supervision and donned shoes with set up assist. MD present for morning rounds. Stand<>pivot bed<>WC with RW and supervision and transported to 28M ortho gym in [facility] total A for time management purposes. Pt performed ambulatory simulated car transfer with RW and min A provided by pt's sister. Educated pt's sister on importance of using gait belt and providing CGA/close supervision at all times. Pt then ambulated 85f on uneven surfaces (ramp) with RW and CGA provided by pt's sister. Pt navigated 4 steps with R railing and CGA provided by therapist then switched off and navigated additional 4 steps with R railing and CGA provided by pt's sister. Educated pt's sister on hand placement, technique, and body positioning with stair navigation. Pt then ambulated 1262fwith RW and CGA  provided by pt's sister to 28M elevators and [facility]. Per NT, pt had not voided since 5am therefore encouraged pt to attempt to void. Pt assisted to bathroom by sister, providing CGA overall and demonstrating good recall of safety techniques learned during session. Pt ambulated to sink to wash hands then requested to return to bed.  Concluded session with pt sitting EOB, needs within reach, and sister present at bedside.   Cleared pt's sister to assist with bathroom transfers. Safety plan updated and NT made aware.   Therapy Documentation Precautions:  Precautions Precautions: Fall Precaution Comments: contact precautions Restrictions Weight Bearing Restrictions: No  Therapy/Group: Individual Therapy AnAlfonse AlpersT, DPT   05/31/2021, 7:11 AM

## 2021-05-31 NOTE — Progress Notes (Signed)
Occupational Therapy Session Note  Patient Details  Name: Diane Todd MRN: [id number] Date of Birth: 12-Aug-2001  Today's Date: 05/31/2021 OT Individual Time: 1300-1429 OT Individual Time Calculation (min): 89 min    Short Term Goals:  Week 2:  OT Short Term Goal 1 (Week 2): Pt will complete toilet transfer with min assist with LRAD OT Short Term Goal 2 (Week 2): Pt will complete LB dressing with supervision 3/4 times for increased endurance and balance OT Short Term Goal 3 (Week 2): Pt will complete 2 grooming tasks in standing for increased balance and activity tolerance  Skilled Therapeutic Interventions/Progress Updates:    Pt received supine with her sister [name] present, reporting 6/10 back pain on the R flank. Repositioning and exercise during session for pain management. Pt completed bed mobility with supervision to EOB. CGA stand pivot transfer to w/c. First half of session in ADL apt, discussing home mobility, kitchen tasks, importance of social supports, IADL/hobbies, and recommendations for grading return to work/school. Pt very receptive to all edu. Sidestepping at [employer] with practice moving functional objects, focus on fall risk reduction and energy conservation, with CGA overall. Second half of the session focused on core and glute stability from supine on the mat. Supine glute bridges, manual resistance hip adduction/abduction 3x10 repetitions. PROM provided to B hips/back for pain relief. From EOM pt completed core flexion/extension with a 2kg medicine ball, requiring mod A for posterior lean. Encouragement provided as pt hard on herself when struggling with exercises. Pt returned to her w/c with CGA, stand pivot transfer. Nursing staff waiting on clean urine catch so encouraged pt to attempt to void. She completed bathroom transfer with CGA overall. She was unable to void. She returned to supine in bed and was left with all needs met, sister present.   Therapy  Documentation Precautions:  Precautions Precautions: Fall Precaution Comments: contact precautions Restrictions Weight Bearing Restrictions: No  Therapy/Group: Individual Therapy  Curtis Sites 05/31/2021, 6:14 AM

## 2021-06-01 LAB — URINE CULTURE: Culture: <10000 — AB

## 2021-06-01 MED ORDER — LIDOCAINE 5 % EX PTCH
2.0000 | MEDICATED_PATCH | CUTANEOUS | Status: DC
  Administered 2021-06-01 – 2021-06-04 (×4): 2 via TRANSDERMAL
  Filled 2021-06-01 (×4): qty 2

## 2021-06-01 MED ORDER — NITROFURANTOIN MONOHYD MACRO 100 MG PO CAPS
100.0000 mg | ORAL_CAPSULE | Freq: Two times a day (BID) | ORAL | Status: DC
  Administered 2021-06-01 – 2021-06-05 (×9): 100 mg via ORAL
  Filled 2021-06-01 (×10): qty 1

## 2021-06-01 MED ORDER — TRAMADOL HCL 50 MG PO TABS
50.0000 mg | ORAL_TABLET | Freq: Four times a day (QID) | ORAL | Status: DC | PRN
  Administered 2021-06-01: 50 mg via ORAL
  Filled 2021-06-01: qty 1

## 2021-06-01 NOTE — Progress Notes (Signed)
Occupational Therapy Session Note  Patient Details  Name: Diane Todd MRN: [id number] Date of Birth: [birth year]  Today's Date: 06/01/2021 OT Individual Time: 8185-6314 OT Individual Time Calculation (min): 44 min    Short Term Goals: Week 2:  OT Short Term Goal 1 (Week 2): Pt will complete toilet transfer with min assist with LRAD OT Short Term Goal 1 - Progress (Week 2): Met OT Short Term Goal 2 (Week 2): Pt will complete LB dressing with supervision 3/4 times for increased endurance and balance OT Short Term Goal 2 - Progress (Week 2): Met OT Short Term Goal 3 (Week 2): Pt will complete 2 grooming tasks in standing for increased balance and activity tolerance OT Short Term Goal 3 - Progress (Week 2): Progressing toward goal Week 3:  OT Short Term Goal 1 (Week 3): STG = LTGs due to remaining LOS    Skilled Therapeutic Interventions/Progress Updates:    Pt greeted at time of session reclined in bed resting agreeable to OT session, had c/o mild back pain no # given, wanting to shower. Ambulated bed <> shower with CGA/Min A for ataxic gait and balance with RW. Cues and demonstration for posterior entry method. Note HR 130s prior to shower, pursed lip breathing until HR in 120s which pt states has been her normal. Doffed clothing seated before showering CGA/close supervision for standing portion only. Sit <> stands CGA/Min A throughout. Seated EOB for dressing with set up for UB and Supervision for LB underwear and pants. Pt reclined in bed resting, hand off to RN.    Therapy Documentation Precautions:  Precautions Precautions: Fall Precaution Comments: contact precautions Restrictions Weight Bearing Restrictions: No     Therapy/Group: Individual Therapy  Viona Gilmore 06/01/2021, 7:17 AM

## 2021-06-01 NOTE — Plan of Care (Signed)
  Problem: RH Wheelchair Mobility Goal: LTG Patient will propel w/c in controlled environment (PT) Description: LTG: Patient will propel wheelchair in controlled environment, # of feet with assist (PT) Outcome: Not Applicable Flowsheets (Taken 06/01/2021 0736) LTG: Pt will propel w/c in controlled environ  assist needed:: (D/C) -- Note: D/C

## 2021-06-01 NOTE — Progress Notes (Signed)
PROGRESS NOTE   Subjective/Complaints:   Pt reports still having side and low back pain- low to mid back.  Aching-= icy hot hasn't helped.   Was willing to try tramadol/lidoderm for pain.   Having urinary urgency/frequency- Spiked a temp to 100.9 last night- will start Macrobid- U/A is (+) again for UTI- will dofor 7 days.   ROS:  Pt denies SOB, abd pain, CP, N/V/C/D, and vision changes   Objective:   No results found. No results for input(s): WBC, HGB, HCT, PLT in the last 72 hours.  No results for input(s): NA, K, CL, CO2, GLUCOSE, BUN, CREATININE, CALCIUM in the last 72 hours.   Intake/Output Summary (Last 24 hours) at 06/01/2021 1044 Last data filed at 05/31/2021 2100 Gross per 24 hour  Intake 540 ml  Output --  Net 540 ml        Physical Exam: Vital Signs Blood pressure 102/65, pulse (!) 110, temperature 99.5 F (37.5 C), temperature source Oral, resp. rate 18, height 5\' 7"  (1.702 m), weight 93.1 kg, SpO2 97 %.    General: awake, alert, appropriate, sitting up in bed- family asleep in room; NAD; looks/feels a little warm HENT: conjugate gaze; oropharynx moist CV: regular  rhythm, tachycardic rate; no JVD Pulmonary: CTA B/L; no W/R/R- good air movement GI: soft, NT, ND, (+)BS Psychiatric: appropriate Neurological: alert Neuro: alert, oriented to person, place, mo/year. Fair insight and awareness. Blurred vision but diplopia less today, tracking better to all fields. Motor 4- to 4/5 UE's and + to 4/5 LE's. Sensory 1+/2 LE essentially stable.. DTR's 3+ in all 4's.  Musculoskeletal: Full ROM, No pain with AROM or PROM in the neck, trunk, or extremities. Posture appropriate. SCM's less tender today, moving neck more freely TTP over R middle/lateral side/middle/to low back.     Assessment/Plan: 1. Functional deficits which require 3+ hours per day of interdisciplinary therapy in a comprehensive inpatient  rehab setting. Physiatrist is providing close team supervision and 24 hour management of active medical problems listed below. Physiatrist and rehab team continue to assess barriers to discharge/monitor patient progress toward functional and medical goals  Care Tool:  Bathing    Body parts bathed by patient: Right arm, Left arm, Chest, Abdomen, Front perineal area, Right upper leg, Left upper leg, Face, Right lower leg, Left lower leg, Buttocks   Body parts bathed by helper: Buttocks     Bathing assist Assist Level: Supervision/Verbal cueing     Upper Body Dressing/Undressing Upper body dressing   What is the patient wearing?: Pull over shirt, Bra    Upper body assist Assist Level: Set up assist    Lower Body Dressing/Undressing Lower body dressing      What is the patient wearing?: Pants, Underwear/pull up     Lower body assist Assist for lower body dressing: Contact Guard/Touching assist     Toileting Toileting    Toileting assist Assist for toileting: Moderate Assistance - Patient 50 - 74%     Transfers Chair/bed transfer  Transfers assist  Chair/bed transfer activity did not occur: N/A  Chair/bed transfer assist level: Contact Guard/Touching assist Chair/bed transfer assistive device:  Ambulation assist      Assist level: Supervision/Verbal cueing Assistive device: Walker-rolling Max distance: 179ft   Walk 10 feet activity   Assist     Assist level: Supervision/Verbal cueing Assistive device: Walker-rolling   Walk 50 feet activity   Assist Walk 50 feet with 2 turns activity did not occur: Safety/medical concerns  Assist level: Supervision/Verbal cueing Assistive device: Walker-rolling    Walk 150 feet activity   Assist Walk 150 feet activity did not occur: Safety/medical concerns  Assist level: Supervision/Verbal cueing Assistive device: Walker-rolling    Walk 10 feet on uneven surface   activity   Assist Walk 10 feet on uneven surfaces activity did not occur: Safety/medical concerns         Wheelchair     Assist Will patient use wheelchair at discharge?: Yes Type of Wheelchair: Manual    Wheelchair assist level: Contact Guard/Touching assist Max wheelchair distance: 167ft    Wheelchair 50 feet with 2 turns activity    Assist        Assist Level: Contact Guard/Touching assist   Wheelchair 150 feet activity     Assist      Assist Level: Maximal Assistance - Patient 25 - 49% (pt propels 50 ft (33%))   Blood pressure 102/65, pulse (!) 110, temperature 99.5 F (37.5 C), temperature source Oral, resp. rate 18, height 5\' 7"  (1.702 m), weight 93.1 kg, SpO2 97 %.  Medical Problem List and Plan: 1.  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  Patient received 5 days IV Solu-Medrol/5 treatments and plasmapheresis             -patient may shower             -ELOS/Goals: 14 days, supervision to min assist with PT and OT  -con't CIR- PT, OT and SLP 2. Impaired mobility: continue SCDs 3. Pain at bilateral temples/bilateral SCM's: may be secondary to bruxism- discussed with therapy- we do not have mouth guards here.  - increased amitriptyline to 25mg  -continue stretches as we've reviewed and with therapy  -might be worthwhile to have family bring in mouth piece from outside -Discussed using tylenol PRN- this helped.  7/15- is having more pain on back- will con't tylenol prn, however add Lidoderm patches x2 8pm to 8am and try tramadol low dose q6 hours prn for bad pain.  4. Anxiety: increase amitriptyline to 25mg  HS--seems to help             -antipsychotic agents: N/A 5. Neuropsych: This patient is capable of making decisions on her own behalf. 6. Skin/Wound Care: Routine skin checks 7. Fluids/Electrolytes/Nutrition: Routine in and outs with follow-up chemistries 8.  Hypertension. Resolved, now hypotensive. D/c'ed Toprol.   -bp  controlled 7/15 9.  Hypothyroidism.  ContinuevSynthroid. 10.  H. pylori.  Treatment initiated 6/24.  Amoxicillin 1 g, Biaxin 500 mg twice daily, PPI twice daily x14 days total 11.  UTI/ESBL/E. Coli/urinary retention.  Contact precautions.  Patient completed course of fosfomycin.  Foley discontinued             -UC with no growth.              -continue trial of flomax   7/13 voiding with low pvr's---can dc urecholine  q2H voiding while awake  7/15 low grade temp: recheck ua, ucx  7/16- Appears like has another UTI- will try Macrobid 100 mg BID per pharmacy x 7 days- might need longer if still symptomatic. Of note, Tm was 100.9  last night.  12.  Obesity.  BMI 31.28.  Dietary follow-up 13.  CKD stage III.    Nursing order placed to encourage 6-8 glasses of water per day.   -7/11 Cr down to 0.9   14.  Tobacco abuse.  Continue counseling 15. Hypokalemia: klor supplemented, resolved.  16. Vitamin B1 deficiency: continue thiamin 100mg  daily 17. Insomnia: continue amitriptyline 10mg  HS 18. Tachycardia: pt is asymptomatic low dose propranolol added by Dr. this morning  -need to avoid hypotension as above--observe today     LOS: 15 days A FACE TO FACE EVALUATION WAS PERFORMED  Annitta Fifield 06/01/2021, 10:44 AM

## 2021-06-01 NOTE — Progress Notes (Signed)
Physical Therapy Session Note  Patient Details  Name: Diane Todd MRN: [id number] Date of Birth: [birth year]  Today's Date: 06/01/2021 PT Individual Time: 8115-7262 PT Individual Time Calculation (min): 53 min   Short Term Goals: Week 2:  PT Short Term Goal 1 (Week 2): Pt will perform sit to stands with consistently with minA PT Short Term Goal 1 - Progress (Week 2): Met PT Short Term Goal 2 (Week 2): Pt will perform bed<>chair transfers with LRAD and CGA PT Short Term Goal 2 - Progress (Week 2): Met PT Short Term Goal 3 (Week 2): Pt will perfom car transfer with LRAD and CGA PT Short Term Goal 3 - Progress (Week 2): Met Week 3:  PT Short Term Goal 1 (Week 3): STG=LTG due to LOS  Skilled Therapeutic Interventions/Progress Updates:   Received pt long sitting in bed eating lunch with sister present at bedside, pt agreeable to PT treatment, and reported pain 7/10 in L shoulder/upper back. Could not locate RN but notified NT of request for pain medication. Repositioning, rest breaks, and distraction done to reduce pain levels. Session with emphasis on functional mobility/transfers, generalized strengthening, dynamic standing balance/coordination, gait training, and improved activity tolerance. Pt performed bed mobility with supervision and use of bedrails with HOB elevated x 2 trials and donned/doffed shoes with set up assist. Stand<>pivot bed<>WC with RW and supervision and transported to/from room on 5C in [facility] total A for time management purposes. Pt performed sit<>stand with RW and supervision and ambulated 121f with RW and close supervision. Pt with a few instances of L toe catching but able to self correct. Ambulated 170fx 2 trials to/from Nustep and performed BLE strengthening on Nustep at workload 3 for 8 minutes for a total of 301 steps for improved cardiovascular endurance and LE strengthening with cues for eccentric control. Sit<>stand with RW and supervision x 3 trials and performed  the following exercises standing with BUE support on RW and CGA for balance: -high knee marching 2x10 bilaterally  -heel raises 2x15 -hip abduction x10 bilaterally -hamstring curls x10 bilaterally Pt requested to return to bed and transferred WC<>bed stand<>pivot with RW and supervision. Pt able to recall events from therapy session with min cues overall. Concluded session with pt sitting in bed with all needs within reach. Encouraged pt to update memory notebook with events from session.    Therapy Documentation Precautions:  Precautions Precautions: Fall Precaution Comments: contact precautions Restrictions Weight Bearing Restrictions: No  Therapy/Group: Individual Therapy AnAlfonse AlpersT, DPT   06/01/2021, 7:33 AM

## 2021-06-02 NOTE — Progress Notes (Signed)
PROGRESS NOTE   Subjective/Complaints:   Pt reports lidoderm helped back pain somewhat- also took tramadol x1 yesterday- thinks it helped.  No side effects from macrodantin- didn't have Sx's of UTI, but did have fever Friday night.  No more fevers  ROS:   Pt denies SOB, abd pain, CP, N/V/C/D, and vision changes   Objective:   No results found. No results for input(s): WBC, HGB, HCT, PLT in the last 72 hours.  No results for input(s): NA, K, CL, CO2, GLUCOSE, BUN, CREATININE, CALCIUM in the last 72 hours.   Intake/Output Summary (Last 24 hours) at 06/02/2021 1612 Last data filed at 06/02/2021 1500 Gross per 24 hour  Intake 236 ml  Output --  Net 236 ml        Physical Exam: Vital Signs Blood pressure 106/63, pulse 68, temperature 100.2 F (37.9 C), temperature source Oral, resp. rate 19, height 5\' 7"  (1.702 m), weight 94.7 kg, SpO2 97 %.     General: awake, alert, appropriate, sitting up in bed; family member is at bedside;  NAD HENT: conjugate gaze; oropharynx moist CV: regular rate; no JVD Pulmonary: CTA B/L; no W/R/R- good air movement GI: soft, NT, ND, (+)BS Psychiatric: appropriate Neurological: Ox3  Neuro: alert, oriented to person, place, mo/year. Fair insight and awareness. Blurred vision but diplopia less today, tracking better to all fields. Motor 4- to 4/5 UE's and + to 4/5 LE's. Sensory 1+/2 LE essentially stable.. DTR's 3+ in all 4's.  Musculoskeletal: Full ROM, No pain with AROM or PROM in the neck, trunk, or extremities. Posture appropriate. SCM's less tender today, moving neck more freely TTP over R middle/lateral side/middle/to low back.Slightly less today     Assessment/Plan: 1. Functional deficits which require 3+ hours per day of interdisciplinary therapy in a comprehensive inpatient rehab setting. Physiatrist is providing close team supervision and 24 hour management of active medical  problems listed below. Physiatrist and rehab team continue to assess barriers to discharge/monitor patient progress toward functional and medical goals  Care Tool:  Bathing    Body parts bathed by patient: Right arm, Left arm, Chest, Abdomen, Front perineal area, Right upper leg, Left upper leg, Face, Right lower leg, Left lower leg, Buttocks   Body parts bathed by helper: Buttocks     Bathing assist Assist Level: Supervision/Verbal cueing     Upper Body Dressing/Undressing Upper body dressing   What is the patient wearing?: Pull over shirt, Bra    Upper body assist Assist Level: Set up assist    Lower Body Dressing/Undressing Lower body dressing      What is the patient wearing?: Pants, Underwear/pull up     Lower body assist Assist for lower body dressing: Contact Guard/Touching assist     Toileting Toileting    Toileting assist Assist for toileting: Moderate Assistance - Patient 50 - 74%     Transfers Chair/bed transfer  Transfers assist  Chair/bed transfer activity did not occur: N/A  Chair/bed transfer assist level: Supervision/Verbal cueing Chair/bed transfer assistive device: assist      Assist level: Supervision/Verbal cueing Assistive device: Walker-rolling Max distance: 152ft  Walk 10 feet activity   Assist     Assist level: Supervision/Verbal cueing Assistive device: Walker-rolling   Walk 50 feet activity   Assist Walk 50 feet with 2 turns activity did not occur: Safety/medical concerns  Assist level: Supervision/Verbal cueing Assistive device: Walker-rolling    Walk 150 feet activity   Assist Walk 150 feet activity did not occur: Safety/medical concerns  Assist level: Supervision/Verbal cueing Assistive device: Walker-rolling    Walk 10 feet on uneven surface  activity   Assist Walk 10 feet on uneven surfaces activity did not occur: Safety/medical concerns          Wheelchair     Assist Will patient use wheelchair at discharge?: Yes Type of Wheelchair: Manual    Wheelchair assist level: Contact Guard/Touching assist Max wheelchair distance: 133ft    Wheelchair 50 feet with 2 turns activity    Assist        Assist Level: Contact Guard/Touching assist   Wheelchair 150 feet activity     Assist      Assist Level: Maximal Assistance - Patient 25 - 49% (pt propels 50 ft (33%))   Blood pressure 106/63, pulse 68, temperature 100.2 F (37.9 C), temperature source Oral, resp. rate 19, height 5\' 7"  (1.702 m), weight 94.7 kg, SpO2 97 %.  Medical Problem List and Plan: 1.  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  Patient received 5 days IV Solu-Medrol/5 treatments and plasmapheresis             -patient may shower             -ELOS/Goals: 14 days, supervision to min assist with PT and OT  Continue CIR- PT, OT and SLP 2. Impaired mobility: continue SCDs 3. Pain at bilateral temples/bilateral SCM's: may be secondary to bruxism- discussed with therapy- we do not have mouth guards here.  - increased amitriptyline to 25mg  -continue stretches as we've reviewed and with therapy  -might be worthwhile to have family bring in mouth piece from outside -Discussed using tylenol PRN- this helped.  7/16- is having more pain on back- will con't tylenol prn, however add Lidoderm patches x2 8pm to 8am and try tramadol low dose q6 hours prn for bad pain. 7/17 - pain is better with lidoderm and tramadol prn- has only taken tramadol x1 so far- lidoderm really pretty helpful last night.  4. Anxiety: increase amitriptyline to 25mg  HS--seems to help             -antipsychotic agents: N/A 5. Neuropsych: This patient is capable of making decisions on her own behalf. 6. Skin/Wound Care: Routine skin checks 7. Fluids/Electrolytes/Nutrition: Routine in and outs with follow-up chemistries 8.  Hypertension. Resolved, now hypotensive.  D/c'ed Toprol.   -bp controlled 7/15 9.  Hypothyroidism.  ContinuevSynthroid. 10.  H. pylori.  Treatment initiated 6/24.  Amoxicillin 1 g, Biaxin 500 mg twice daily, PPI twice daily x14 days total 11.  UTI/ESBL/E. Coli/urinary retention.  Contact precautions.  Patient completed course of fosfomycin.  Foley discontinued             -UC with no growth.              -continue trial of flomax   7/13 voiding with low pvr's---can dc urecholine  q2H voiding while awake  7/15 low grade temp: recheck ua, ucx  7/16- Appears like has another UTI- will try Macrobid 100 mg BID per pharmacy x 7 days- might need longer if still symptomatic. Of note,  Tm was 100.9 last night.   7/17- Tc is 100.2- Tm is 100.3 in last 24 hours- slightly better- checking labs in AM to see if WBC normalized- and urine Cx should come back soon so can tell if on right ABX.  12.  Obesity.  BMI 31.28.  Dietary follow-up 13.  CKD stage III.    Nursing order placed to encourage 6-8 glasses of water per day.   -7/11 Cr down to 0.9  7/17- labs in AM  14.  Tobacco abuse.  Continue counseling 15. Hypokalemia: klor supplemented, resolved.  16. Vitamin B1 deficiency: continue thiamin 100mg  daily 17. Insomnia: continue amitriptyline 10mg  HS 18. Tachycardia: pt is asymptomatic low dose propranolol added by Dr. this morning  -need to avoid hypotension as above--observe today  7/17- HR 68 this AM and in 60s-70s today- don't make changes.     LOS: 16 days A FACE TO FACE EVALUATION WAS PERFORMED  [place] 06/02/2021, 4:12 PM

## 2021-06-02 NOTE — Progress Notes (Signed)
Speech Language Pathology Daily Session Note  Patient Details  Name: Diane Todd MRN: [id number] Date of Birth: [birth year]  Today's Date: 06/02/2021 SLP Individual Time: 1400-1430 SLP Individual Time Calculation (min): 30 min  Short Term Goals: Week 2: SLP Short Term Goal 1 (Week 2): Pt will demonstrate problem solving skills in mildly complex tasks with sup A verbal cues. SLP Short Term Goal 2 (Week 2): Pt will demonstrate recall of novel, daily events with sup-to-min A verbal cues. SLP Short Term Goal 3 (Week 2): Pt will self-monitor and self-correct functional errors with sup A verbal cues. SLP Short Term Goal 4 (Week 2): Pt will demonstrate recall of novel, daily events with use of written aid given supervision A verbal cues.  Skilled Therapeutic Interventions: Pt was seen for skilled ST targeting cognitive goals.  Upon arrival, pt was awake, alert, and agreeable to participating in treatment.  SLP facilitated the session with a novel card game targeting memory goals.  Pt was able to generate and recall word-picture associations with min assist faded to supervision verbal cues. She was then able to independently name 100% of category members from generative naming portion of task following a brief delay. Pt was left in bed with sister at bedside.  Continue per current plan of care.  Pain Pain Assessment Pain Scale: 0-10 Pain Score: 0-No pain  Therapy/Group: Individual Therapy  Alayha Babineaux, Melanee Spry 06/02/2021, 4:02 PM

## 2021-06-03 ENCOUNTER — Inpatient Hospital Stay (HOSPITAL_COMMUNITY): Payer: Medicaid Other

## 2021-06-03 DIAGNOSIS — M7989 Other specified soft tissue disorders: Secondary | ICD-10-CM

## 2021-06-03 LAB — BASIC METABOLIC PANEL
Anion gap: 9 (ref 5–15)
BUN: 9 mg/dL (ref 6–20)
CO2: 25 mmol/L (ref 22–32)
Calcium: 9 mg/dL (ref 8.9–10.3)
Chloride: 104 mmol/L (ref 98–111)
Creatinine, Ser: 1.24 mg/dL — ABNORMAL HIGH (ref 0.44–1.00)
GFR, Estimated: >60 mL/min (ref >60)
Glucose, Bld: 80 mg/dL (ref 70–99)
Potassium: 4.8 mmol/L (ref 3.5–5.1)
Sodium: 138 mmol/L (ref 135–145)

## 2021-06-03 LAB — CBC WITH DIFFERENTIAL/PLATELET
Abs Immature Granulocytes: 0.04 10*3/uL (ref 0.00–0.07)
Basophils Absolute: 0 10*3/uL (ref 0.0–0.1)
Basophils Relative: 0 %
Eosinophils Absolute: 0.3 10*3/uL (ref 0.0–0.5)
Eosinophils Relative: 4 %
HCT: 38.5 % (ref 36.0–46.0)
Hemoglobin: 11.9 g/dL — ABNORMAL LOW (ref 12.0–15.0)
Immature Granulocytes: 1 %
Lymphocytes Relative: 9 %
Lymphs Abs: 0.6 10*3/uL — ABNORMAL LOW (ref 0.7–4.0)
MCH: 29 pg (ref 26.0–34.0)
MCHC: 30.9 g/dL (ref 30.0–36.0)
MCV: 93.9 fL (ref 80.0–100.0)
Monocytes Absolute: 0.8 10*3/uL (ref 0.1–1.0)
Monocytes Relative: 12 %
Neutro Abs: 5.1 10*3/uL (ref 1.7–7.7)
Neutrophils Relative %: 74 %
Platelets: 333 10*3/uL (ref 150–400)
RBC: 4.1 MIL/uL (ref 3.87–5.11)
RDW: 18.1 % — ABNORMAL HIGH (ref 11.5–15.5)
WBC: 7 10*3/uL (ref 4.0–10.5)
nRBC: 0 % (ref 0.0–0.2)

## 2021-06-03 NOTE — Discharge Summary (Signed)
Physician Discharge Summary  Patient ID: Diane Todd MRN: [id number] DOB/AGE: 2001/10/21 20 y.o.  Admit date: 05/17/2021 Discharge date: 06/05/2021  Discharge Diagnoses:  Principal Problem:   Neuromyelitis optica (devic) (HCC) Mood stabilization Hypertension Hypothyroidism H. Pylori UTI/ESBL Obesity CKD stage III Tobacco abuse Insomnia  Discharged Condition: Stable  Significant Diagnostic Studies: IR Fluoro Guide CV Line Right  Result Date: 05/08/2021 INDICATION: Suspect neuromyelitis optica spectrum disorder. Catheter needed for plasmapheresis. EXAM: FLUOROSCOPIC AND ULTRASOUND GUIDED PLACEMENT OF A NON-TUNNELED DIALYSIS CATHETER Physician: Rachelle Hora. Henn, MD MEDICATIONS: 1% lidocaine ANESTHESIA/SEDATION: None FLUOROSCOPY TIME:  Fluoroscopy Time: 12 seconds, 7 mGy COMPLICATIONS: None immediate. PROCEDURE: Informed consent was obtained for catheter placement. The patient was placed supine on the interventional table. Ultrasound confirmed a patent right internal jugular vein. Ultrasound images were obtained for documentation. The right neck was prepped and draped in a sterile fashion. The right neck was anesthetized with 1% lidocaine. Maximal barrier sterile technique was utilized including caps, mask, sterile gowns, sterile gloves, sterile drape, hand hygiene and skin antiseptic. A small incision was made with #11 blade scalpel. A 21 gauge needle directed into the right internal jugular vein with ultrasound guidance. A micropuncture dilator set was placed. A 15 cm Trialysis was selected. The catheter was advanced over a wire and positioned at the superior cavoatrial junction. Fluoroscopic images were obtained for documentation. Both dialysis lumens were found to aspirate and flush well. The proper amount of heparin was flushed in both lumens. The central venous lumen was flushed with normal saline. Catheter was sutured to skin. FINDINGS: Catheter tip at the superior cavoatrial junction.  IMPRESSION: Successful placement of a right jugular non-tunneled dialysis/pheresis catheter using ultrasound and fluoroscopic guidance. Electronically Signed   By: Richarda Overlie M.D.   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 Catheter needed for plasmapheresis.   EXAM: FLUOROSCOPIC AND ULTRASOUND GUIDED PLACEMENT OF A NON-TUNNELED DIALYSIS CATHETER   Physician: Rachelle Hora. Henn, MD   MEDICATIONS: 1% lidocaine   ANESTHESIA/SEDATION: None   FLUOROSCOPY TIME:  Fluoroscopy Time: 12 seconds, 7 mGy   COMPLICATIONS: None immediate.   PROCEDURE: Informed consent was obtained for catheter placement. The patient was placed supine on the interventional table. Ultrasound confirmed a patent right internal jugular vein. Ultrasound images were obtained for documentation. The right neck was prepped and draped in a sterile fashion. The right neck was anesthetized with 1% lidocaine. Maximal barrier sterile technique was utilized including caps, mask, sterile gowns, sterile gloves, sterile drape, hand hygiene and skin antiseptic. A small incision was made with #11 blade scalpel. A 21 gauge needle directed into the right internal jugular vein with ultrasound guidance. A micropuncture dilator set was placed. A 15 cm Trialysis was selected. The catheter was advanced over a wire and positioned at the superior cavoatrial junction. Fluoroscopic images were obtained for documentation. Both dialysis lumens were found to aspirate and flush well. The proper amount of heparin was flushed in both lumens. The central venous lumen was flushed with normal saline. Catheter was sutured to skin.   FINDINGS: Catheter tip at the superior cavoatrial junction.   IMPRESSION: Successful placement of a right jugular non-tunneled dialysis/pheresis catheter using ultrasound and fluoroscopic guidance.     Electronically Signed   By: Richarda Overlie M.D.   On: 05/08/2021 16:58    VAS [place] LOWER  EXTREMITY VENOUS (DVT)  Result Date: 06/03/2021  Lower Venous DVT Study Patient Name:  Diane MERAZ  Todd  Date of Exam:   06/03/2021 Medical Rec #: [id number]            Accession #:    [id number] Date of Birth: 2001-03-21            Patient Gender: F Patient Age:   20Y [facility] Procedure:      VAS US LOWER EXTREMITY VENOUS (DVT) Referring Phys: [id number] Earna CoderZACHARY T [id number] --------------------------------------------------------------------------------  Indications: Swelling.  Risk Factors: None identified. Comparison Study: No prior studies. Performing Technologist: Chanda BusingGregory Collins RVT  Examination Guidelines: A complete evaluation includes B-mode imaging, spectral Doppler, color Doppler, and power Doppler as needed of all accessible portions of each vessel. Bilateral testing is considered an integral part of a complete examination. Limited examinations for reoccurring indications may be performed as noted. The reflux portion of the exam is performed with the patient in reverse Trendelenburg.  +---------+---------------+---------+-----------+----------+--------------+ RIGHT    CompressibilityPhasicitySpontaneityPropertiesThrombus Aging +---------+---------------+---------+-----------+----------+--------------+ CFV      Full           Yes      Yes                                 +---------+---------------+---------+-----------+----------+--------------+ SFJ      Full                                                        +---------+---------------+---------+-----------+----------+--------------+ FV Prox  Full                                                        +---------+---------------+---------+-----------+----------+--------------+ FV Mid   Full                                                        +---------+---------------+---------+-----------+----------+--------------+ FV DistalFull                                                         +---------+---------------+---------+-----------+----------+--------------+ PFV      Full                                                        +---------+---------------+---------+-----------+----------+--------------+ POP      Full           Yes      Yes                                 +---------+---------------+---------+-----------+----------+--------------+ PTV      Full                                                        +---------+---------------+---------+-----------+----------+--------------+  PERO     Full                                                        +---------+---------------+---------+-----------+----------+--------------+   +---------+---------------+---------+-----------+----------+--------------+ LEFT     CompressibilityPhasicitySpontaneityPropertiesThrombus Aging +---------+---------------+---------+-----------+----------+--------------+ CFV      Full           Yes      Yes                                 +---------+---------------+---------+-----------+----------+--------------+ SFJ      Full                                                        +---------+---------------+---------+-----------+----------+--------------+ FV Prox  Full                                                        +---------+---------------+---------+-----------+----------+--------------+ FV Mid   Full                                                        +---------+---------------+---------+-----------+----------+--------------+ FV DistalFull                                                        +---------+---------------+---------+-----------+----------+--------------+ PFV      Full                                                        +---------+---------------+---------+-----------+----------+--------------+ POP      Full           Yes      Yes                                  +---------+---------------+---------+-----------+----------+--------------+ PTV      Full                                                        +---------+---------------+---------+-----------+----------+--------------+ PERO     Full                                                        +---------+---------------+---------+-----------+----------+--------------+  Summary: RIGHT: - There is no evidence of deep vein thrombosis in the lower extremity.  - No cystic structure found in the popliteal fossa.  LEFT: - There is no evidence of deep vein thrombosis in the lower extremity.  - No cystic structure found in the popliteal fossa.  *See table(s) above for measurements and observations. Electronically signed by Fabienne Bruns MD on 06/03/2021 at 7:52:25 PM.    Final    DG FL GUIDED LUMBAR PUNCTURE  Result Date: 05/08/2021 CLINICAL DATA:  Optic neuritis, altered mental status EXAM: DIAGNOSTIC LUMBAR PUNCTURE UNDER FLUOROSCOPIC GUIDANCE COMPARISON:  None FLUOROSCOPY TIME:  Fluoroscopy Time:  12 seconds Radiation Exposure Index (if provided by the fluoroscopic device): 1.5 mGy Number of Acquired Spot Images: 0 PROCEDURE: Informed consent was obtained from the patient prior to the procedure, including potential complications of headache, allergy, and pain. With the patient prone, the lower back was prepped with Betadine. 1% Lidocaine was used for local anesthesia. Lumbar puncture was performed at the L4-L5 level using a 20 gauge needle with return of clear CSF with an opening pressure of 17 cm water. Approximately 13 ml of CSF were obtained for laboratory studies. The patient tolerated the procedure well and there were no apparent complications. IMPRESSION: Successful lumbar puncture. Approximately 13 mL of clear CSF were obtained and sent to the laboratory for analysis. There were no immediate complications. Electronically Signed   By: Caprice Renshaw   On: 05/08/2021 09:48    Labs:  Basic Metabolic  Panel: Recent Labs  Lab 06/03/21 0516  NA 138  K 4.8  CL 104  CO2 25  GLUCOSE 80  BUN 9  CREATININE 1.24*  CALCIUM 9.0    CBC: Recent Labs  Lab 06/03/21 0516  WBC 7.0  NEUTROABS 5.1  HGB 11.9*  HCT 38.5  MCV 93.9  PLT 333    CBG: No results for input(s): GLUCAP in the last 168 hours.  Family history.  Positive for hypertension as well as hyperlipidemia.  Denies any colon cancer esophageal cancer or rectal cancer  Brief HPI:   Quinetta Heide Guile is a 20 y.o. right-handed female with history of hypertension tobacco abuse CKD stage III as well as thyroid disorder.  Patient lives with parent.  Independent prior to admission.  Prior to 4/22 was working at [employer] going to [employer].  Presented 05/03/2021 with generalized weakness poor appetite nausea and vomiting as well as blurred vision x2 weeks.  Admission chemistries BUN 22 creatinine 1.65 sedimentation rate of 1 hemoglobin 18.8 RBC 6.71 TSH 5.224.  Initial work-up at bedside hospital/[facility] showed lesions highly suggestive of NMSOD/neuromyelitis optica spectrum disorder.  MRI of the brain showed hyperintense T2 weighted signal and mild contrast-enhancement within the medial thalami and periaqueductal gray matter.  Demyelinating disease remained a primary consideration.  MRI of the orbits with and without contrast were considered.  CSF sampling considered.  No demyelinating lesion of the cervical thoracic cord noted.  MRI of the orbits with and without contrast showed normal appearance of the orbits.  MRI cervical thoracic spine showed T7-8 small right subarticular disc protrusion without stenosis.  T8-9 small left subarticular disc protrusion without stenosis.  Renal ultrasound showed no hydronephrosis.  Neurology work-up follow-up for NMO recommended serum for AQ-P4-IgG antibody that was pending.  MO G-IgG antibody negative.  Lumbar puncture completed 05/08/2021 unremarkable no growth.  She did complete a 5-day course of high-dose  steroids and 3 days of high-dose thiamine.  Started on plasmapheresis with  plans of 5 total sessions last completed 05/17/2021.  Ophthalmology consulted Dr. Luciana Axe with a funduscopic exam completed that showed papillary retinal edema appeared concerning for elevated ICP they recommended no further intervention at this time given minimal fundus findings they did recommend a follow-up neuro-ophthalmology at a tertiary center.  Patient with history of H. pylori treated initiated 05/10/2021 with amoxicillin/Biaxin twice daily.  PPI twice daily for 14 days total.  A follow-up urine study 05/07/2021 positive for ESBL E. coli discussed with infectious disease pharmacy initially on meropenem and 1 dose of fosfomycin completed treatment and remains on contact precautions.  Therapy evaluations completed due to patient decreased functional mobility was admitted for a comprehensive rehab program.   Hospital Course: Amyjo Heide Guile was admitted to rehab 05/17/2021 for inpatient therapies to consist of PT, ST and OT at least three hours five days a week. Past admission physiatrist, therapy team and rehab RN have worked together to provide customized collaborative inpatient rehab.  Pertaining to patient's neuromyelitis optica spectrum syndrome received 5 days IV Solu-Medrol 5 treatments plasmapheresis would follow-up outpatient.  Venous Dopplers lower extremities negative for DVT.  Bouts of insomnia maintained on Elavil.  She does some nonspecific back pain with use of Lidoderm patch tramadol as needed.  Blood pressure overall controlled initially on Toprol.  She remained on Synthroid for hypothyroidism.  H. pylori treated 05/10/2021 amoxicillin 1 g Biaxin twice daily PPI twice daily x14 days.  UTI/ESBL UTI contact precautions completed course of fosfomycin Foley catheter tube and since been removed.  Obesity BMI 31.28 dietary follow-up.  CKD stage III with latest creatinine 0.90.  Patient did have history of tobacco abuse  received counsel guards to cessation of nicotine products.  She did have some mild tachycardia with the addition of Inderal.   Blood pressures were monitored on TID basis and controlled     Rehab course: During patient's stay in rehab weekly team conferences were held to monitor patient's progress, set goals and discuss barriers to discharge. At admission, patient required min mod assist side-lying to sitting moderate assist supine to sit max assist sit to supine minimal assist 10 feet rolling walker  Physical exam.  Blood pressure 134/70 pulse 83 temperature 99 respirations 18 oxygen saturation 90% room air Constitutional.  No acute distress HEENT Head.  Normocephalic and atraumatic Eyes.  Pupils round and reactive to light no discharge without nystagmus Neck.  Supple nontender no JVD without thyromegaly Cardiac regular rate rhythm not extra sounds or murmur heard Abdomen.  Soft nontender positive bowel sounds without rebound Respiratory effort normal no respiratory distress without wheeze Skin.  Warm and dry Neurologic.  Alert oriented follows commands.  She did have some complaints of blurred vision left greater than right.  Fair insight and awareness.  Motor 4 - to 4+/5 upper extremity and 3-4/5 proximal to distal lower extremities DTRs 3+ sensation intact  He/She  has had improvement in activity tolerance, balance, postural control as well as ability to compensate for deficits. He/She has had improvement in functional use RUE/LUE  and RLE/LLE as well as improvement in awareness.  Sessions with emphasis on functional ability transfers generalized strengthening.  Performed bed mobility with supervision use of bed rails.  Stand pivot bed to wheelchair rolling walker supervision.  Performed sit to stand rolling walker supervision ambulates 180 feet rolling walker close supervision.  Ambulates bed to shower contact-guard.  Gathers her belongings for activities day living homemaking.  Seated  edge of bed dressing with set  up upper body supervision lower body underwear and pants.  Full family teaching completed plan discharged to home       Disposition: Discharged to home    Diet: Regular  Special Instructions: No driving smoking or alcohol  Medications at discharge 1.  Tylenol as needed 2.  Elavil 25 mg p.o. nightly 3.  Flomax 0.4 mg daily 4.  Synthroid 100 mcg p.o. daily 5.  Lidoderm patch as directed 6.  Melatonin 3 mg p.o. nightly 7.  Multivitamin daily 8.  Protonix 40 mg p.o. twice daily 9.  MiraLAX twice daily hold for loose stools 10.  Inderal 10 mg p.o. daily 11.  Senokot S2 tabs p.o. daily 12.  Tramadol 50 mg every 6 hours as needed pain   30-35 minutes were spent completing discharge summary and discharge planning  Discharge Instructions     Ambulatory referral to Neurology   Complete by: As directed    An appointment is requested in approximately: 4 weeks NMO   Ambulatory referral to Occupational Therapy   Complete by: As directed    Evaluate and treat   Ambulatory referral to Physical Medicine Rehab   Complete by: As directed    Moderate complexity follow-up 1 to 2 weeks neuromyelitis optica   Ambulatory referral to Physical Therapy   Complete by: As directed    Evaluate and treat   Ambulatory referral to Speech Therapy   Complete by: As directed    Evaluate and treat        Follow-up Information     Raulkar, Drema Pry, MD Follow up.   Specialty: Physical Medicine and Rehabilitation Why: 06/10/21 please arrive at 9:00am for 9:20am appointment Contact information: [address] [place] [address] [phone]         Deno Etienne, MD Follow up.   Why: Call for appointment Contact information: [address] [place] [address] [phone]         Toma Deiters, MD Follow up.   Specialty: Internal Medicine Contact information: [address] [place] [address] [phone]                  Signed: Charlton Amor 06/05/2021, 5:15 AM

## 2021-06-03 NOTE — Progress Notes (Signed)
Bilateral lower extremity venous duplex has been completed. Preliminary results can be found in CV Proc through chart review.   06/03/21 1:31 PM Olen Cordial RVT

## 2021-06-03 NOTE — Progress Notes (Signed)
Physical Therapy Session Note  Patient Details  Name: Diane Todd MRN: [id number] Date of Birth: 2001/07/25  Today's Date: 06/03/2021 PT Individual Time: 0800-0908 PT Individual Time Calculation (min): 68 min   Short Term Goals: Week 2:  PT Short Term Goal 1 (Week 2): Pt will perform sit to stands with consistently with minA PT Short Term Goal 1 - Progress (Week 2): Met PT Short Term Goal 2 (Week 2): Pt will perform bed<>chair transfers with LRAD and CGA PT Short Term Goal 2 - Progress (Week 2): Met PT Short Term Goal 3 (Week 2): Pt will perfom car transfer with LRAD and CGA PT Short Term Goal 3 - Progress (Week 2): Met Week 3:  PT Short Term Goal 1 (Week 3): STG=LTG due to LOS  Skilled Therapeutic Interventions/Progress Updates:   Received pt supine in bed asleep with sister present at bedside, pt easily woken and agreeable to PT treatment, and reported 5/10 pain along R shoulder (premedicated). Repositioning done to reduce pain levels. Session with emphasis on functional mobility/transfers, generalized strengthening, dynamic standing balance/coordination, toileting, gait training, and improved activity tolerance. Pt required 3 attempts and CGA to stand from bed and ambulated 68f with RW and close supervision to bathroom. Pt able to manage clothing, void, and perform peri-care with close supervision and increased time. Pt stood at sink and washed hands and brushed teeth with CGA. MD present for morning rounds and pt [facility] time management purposes. Pt ambulated 3662fwith RW and close supervision. O2 sat 98% and HR 146bpm decreasing to 139bpm with 3 minute seated rest break. Pt then performed alternating toe taps to 6in step 3x12 with BUE support on RW and CGA for balance with emphasis on LE strengthening and dynamic standing balance. Pt continues to require multiple rest breaks throughout session due to fatigue. Pt performed TUG with RW and close  supervision with average of 26.3 seconds. Trial 1: 29 seconds, Trial 2: 27 seconds, and Trial 3: 23 seconds. Educated pt on test results and significance indicating high fall risk as well as importance of using RW upon D/C; pt verbalized understanding and in agreement. Pt requested to return to bed and transferred stand<>pivot WC<>bed with RW and supervision and sit<>supine with supervision. Concluded session with pt semi-reclined in bed, needs within reach, and sister present at bedside.   Therapy Documentation Precautions:  Precautions Precautions: Fall Precaution Comments: contact precautions Restrictions Weight Bearing Restrictions: No  Therapy/Group: Individual Therapy AnAlfonse AlpersT, DPT   06/03/2021, 7:19 AM

## 2021-06-03 NOTE — Progress Notes (Signed)
Occupational Therapy Session Note  Patient Details  Name: Diane Todd MRN: [id number] Date of Birth: March 04, 2001  Today's Date: 06/03/2021 Session 1 OT Individual Time: 1030-1102 OT Individual Time Calculation (min): 32 min   Session 2 OT Individual Time: 9628-3662 OT Individual Time Calculation (min): 42 min    Short Term Goals: Week 3:  OT Short Term Goal 1 (Week 3): STG = LTGs due to remaining LOS  Skilled Therapeutic Interventions/Progress Updates:  Session 1: Met pt lying with HOB elevated, pt's sister present, pt agreed to session. Treatment session was focused on maintaining balance during self-care tasks, BUE and [place]. OTS offered self-care, pt expressed completing most tasks but washing face. OTS inquired on concerns about preparing for d/c, pt denied any concerns, pt will need further time to think. Pt was supervision for lying > sitting EOB. Pt expressed needing a break before standing due to fear, OTS provided encouragement of pt's progress with standing. Pt was CGA from EOB > RW. Pt ambulated short distance to sink, able to maintain balance with CGA. Pt washed face while standing with CGA. Pt ambulated with CGA back to EOB with RW. OTS offered standing to participate in Connect 4, pt declined due to fatigue from previous PT session. Pt presents with decreased [place] but noticeably improved throughout game. Pt able to control picking up pieces and placing them into slots with no errors. OT inquired on pt's handwriting and journal therapy session tasks to improve cognition and [place]. Pt's handwriting is legible but will continue to require practice. Pt was left sitting EOB, bed alarm on, all needs met.  Session 2: Met pt side-lying in bed, pt's sister present for hands-on training, pt agreed to session. Treatment session was focused on functional transfers, standing balance, cognition and [place] activities while standing. Pt was supervision for bed mobility. Pt donned shoes with set-up  assist. Pt ambulated to w/c, located by door, with CGA. Pt was brought to therapy bathroom to practice shower bench transfers. Pt's sister able to assist pt with transferring from w/c > shower bench > commode > w/c with CGA. Pt was brought to rehab gym and completed tracing activities on BITS while standing to improve standing tolerance and participate in leisure activities. Pt ambulated short distance to mat and required a rest break. Pt tolerated standing to play hang man to improve cognition, OTS thought of phrase while pt recalled letters to form on board. Pt able to grasp marker with no difficulty. Pt experienced difficulty with remembering letters previously used. OTS upgraded task, pt would make the phrase, remember the phrase and ensuring letters are placed in correct spots. Pt was brought back to room and ambulated short distance with sister assist to bed. Pt was left sitting EOB, bed alarm on, all needs met.   Therapy Documentation Precautions:  Precautions Precautions: Fall Precaution Comments: contact precautions Restrictions Weight Bearing Restrictions: No Pain: Pain Assessment Pain Scale: 0-10 Pain Score: 0-No pain Faces Pain Scale: No hurt    Therapy/Group: Individual Therapy  Bayard Males 06/03/2021, 4:12 PM

## 2021-06-03 NOTE — Progress Notes (Signed)
Speech Language Pathology Daily Session Note  Patient Details  Name: Diane Todd MRN: [id number] Date of Birth: 02-19-2001  Today's Date: 06/03/2021 SLP Individual Time: 3016-0109 SLP Individual Time Calculation (min): 57 min  Short Term Goals: Week 2: SLP Short Term Goal 1 (Week 2): Pt will demonstrate problem solving skills in mildly complex tasks with sup A verbal cues. SLP Short Term Goal 2 (Week 2): Pt will demonstrate recall of novel, daily events with sup-to-min A verbal cues. SLP Short Term Goal 3 (Week 2): Pt will self-monitor and self-correct functional errors with sup A verbal cues. SLP Short Term Goal 4 (Week 2): Pt will demonstrate recall of novel, daily events with use of written aid given supervision A verbal cues.  Skilled Therapeutic Interventions: Skilled ST services focused on cognitive skills. SLP facilitated mildly complex problem solving, error awareness and recall startegies within task (including working memory) utilizing (mildly and complex) account balancing, scheduling and verbal problem solving with current medications, pt demonstrated mod I. Pt required supervision A verbal cues during complex scheduling task for problem solving and error awareness. Pt had made great progress and supports continued deficits in delayed recall. Pt was left in room with sister, call bell within reach and bed alarm set. SLP recommends to continue skilled services.     Pain Pain Assessment Pain Scale: 0-10 Pain Score: 0-No pain Faces Pain Scale: No hurt  Therapy/Group: Individual Therapy  Diane Todd  [facility] 06/03/2021, 1:50 PM

## 2021-06-03 NOTE — Progress Notes (Signed)
PROGRESS NOTE   Subjective/Complaints:  Pt still reports fever. Neck pain and range of motion generally improving. Excited about going home soon. Up with therapy in room when I came by  ROS: Patient denies fever, rash, sore throat, blurred vision, nausea, vomiting, diarrhea, cough, shortness of breath or chest pain, joint or back pain, headache, or mood change.    Objective:   No results found. Recent Labs    06/03/21 0516  WBC 7.0  HGB 11.9*  HCT 38.5  PLT 333    Recent Labs    06/03/21 0516  NA 138  K 4.8  CL 104  CO2 25  GLUCOSE 80  BUN 9  CREATININE 1.24*  CALCIUM 9.0     Intake/Output Summary (Last 24 hours) at 06/03/2021 1006 Last data filed at 06/02/2021 1500 Gross per 24 hour  Intake 236 ml  Output --  Net 236 ml        Physical Exam: Vital Signs Blood pressure 103/66, pulse (!) 102, temperature 98.6 F (37 C), temperature source Oral, resp. rate 18, height 5\' 7"  (1.702 m), weight 95.5 kg, SpO2 99 %.     Constitutional: No distress . Vital signs reviewed. HEENT: EOMI, oral membranes moist Neck: supple Cardiovascular: tachy without murmur. No JVD    Respiratory/Chest: CTA Bilaterally without wheezes or rales. Normal effort    GI/Abdomen: BS +, non-tender, non-distended Ext: no clubbing, cyanosis, or edema Psych: pleasant and cooperative  Neuro: alert, oriented to person, place, mo/year. Fair insight and awareness. Blurred vision but diplopia less today, tracking better to all fields. Motor 4- to 4/5 UE's and + to 4/5 LE's. Sensory 1+/2 LE essentially stable.. DTR's 3+ in all 4's.  Musculoskeletal: Full ROM, No pain with AROM or PROM in the neck, trunk, or extremities. Posture appropriate. Moves neck more freely, neck less tender      Assessment/Plan: 1. Functional deficits which require 3+ hours per day of interdisciplinary therapy in a comprehensive inpatient rehab setting. Physiatrist  is providing close team supervision and 24 hour management of active medical problems listed below. Physiatrist and rehab team continue to assess barriers to discharge/monitor patient progress toward functional and medical goals  Care Tool:  Bathing    Body parts bathed by patient: Right arm, Left arm, Chest, Abdomen, Front perineal area, Right upper leg, Left upper leg, Face, Right lower leg, Left lower leg, Buttocks   Body parts bathed by helper: Buttocks     Bathing assist Assist Level: Supervision/Verbal cueing     Upper Body Dressing/Undressing Upper body dressing   What is the patient wearing?: Pull over shirt, Bra    Upper body assist Assist Level: Set up assist    Lower Body Dressing/Undressing Lower body dressing      What is the patient wearing?: Pants, Underwear/pull up     Lower body assist Assist for lower body dressing: Contact Guard/Touching assist     Toileting Toileting    Toileting assist Assist for toileting: Moderate Assistance - Patient 50 - 74%     Transfers Chair/bed transfer  Transfers assist  Chair/bed transfer activity did not occur: N/A  Chair/bed transfer assist level: Supervision/Verbal cueing Chair/bed transfer  assistive device: Arboriculturist assist      Assist level: Supervision/Verbal cueing Assistive device: Walker-rolling Max distance: 332ft   Walk 10 feet activity   Assist     Assist level: Supervision/Verbal cueing Assistive device: Walker-rolling   Walk 50 feet activity   Assist Walk 50 feet with 2 turns activity did not occur: Safety/medical concerns  Assist level: Supervision/Verbal cueing Assistive device: Walker-rolling    Walk 150 feet activity   Assist Walk 150 feet activity did not occur: Safety/medical concerns  Assist level: Supervision/Verbal cueing Assistive device: Walker-rolling    Walk 10 feet on uneven surface  activity   Assist Walk 10 feet on  uneven surfaces activity did not occur: Safety/medical concerns         Wheelchair     Assist Will patient use wheelchair at discharge?: Yes Type of Wheelchair: Manual    Wheelchair assist level: Contact Guard/Touching assist Max wheelchair distance: 183ft    Wheelchair 50 feet with 2 turns activity    Assist        Assist Level: Contact Guard/Touching assist   Wheelchair 150 feet activity     Assist      Assist Level: Maximal Assistance - Patient 25 - 49% (pt propels 50 ft (33%))   Blood pressure 103/66, pulse (!) 102, temperature 98.6 F (37 C), temperature source Oral, resp. rate 18, height 5\' 7"  (1.702 m), weight 95.5 kg, SpO2 99 %.  Medical Problem List and Plan: 1.  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  Patient received 5 days IV Solu-Medrol/5 treatments and plasmapheresis             -patient may shower             -ELOS/Goals: 06/05/21, supervision to min assist with PT and OT  Continue CIR- PT, OT and SLP 2. Impaired mobility: continue SCDs  -check venous dopplers given low grade fever 3. Pain at bilateral temples/bilateral SCM's: may be secondary to bruxism- discussed with therapy- we do not have mouth guards here.  - increased amitriptyline to 25mg  -continue stretches as we've reviewed and with therapy  -might be worthwhile to have family bring in mouth piece from outside -Discussed using tylenol PRN- this helped.  7/16- is having more pain on back- will con't tylenol prn, however add Lidoderm patches x2 8pm to 8am and try tramadol low dose q6 hours prn for bad pain. 7/18 - pain is better with lidoderm and tramadol prn,heat, ROM 4. Anxiety: increase amitriptyline to 25mg  HS--seems to help             -antipsychotic agents: N/A 5. Neuropsych: This patient is capable of making decisions on her own behalf. 6. Skin/Wound Care: Routine skin checks 7. Fluids/Electrolytes/Nutrition: Routine in and outs with follow-up  chemistries 8.  Hypertension. Resolved, now hypotensive. D/c'ed Toprol.   -bp controlled 7/18 9.  Hypothyroidism.  ContinuevSynthroid. 10.  H. pylori.  Treatment initiated 6/24.  Amoxicillin 1 g, Biaxin 500 mg twice daily, PPI twice daily x14 days total 11.  UTI/ESBL/E. Coli/urinary retention.  Contact precautions.  Patient completed course of fosfomycin.  Foley discontinued             -UC with no growth.              -continue trial of flomax   7/13 voiding with low pvr's---can dc urecholine  q2H voiding while awake  7/18 UA +, UCX with insignificant growth. Had low grade  fever yesterday -on macrodantin empirically--continue -will resend urine for cx  12.  Obesity.  BMI 31.28.  Dietary follow-up 13.  CKD stage III.    Nursing order placed to encourage 6-8 glasses of water per day.   -7/18 9/1.24---encourage fluids  14.  Tobacco abuse.  Continue counseling 15. Hypokalemia: klor supplemented, resolved.  16. Vitamin B1 deficiency: continue thiamin 100mg  daily 17. Insomnia: continue amitriptyline 10mg  HS 18. Tachycardia: pt is asymptomatic--up to 110's with exertion Continue low dose propranolol    -avoid hypotension    LOS: 17 days A FACE TO FACE EVALUATION WAS PERFORMED  06/03/2021, 10:06 AM

## 2021-06-04 ENCOUNTER — Other Ambulatory Visit (HOSPITAL_COMMUNITY): Payer: Self-pay

## 2021-06-04 MED ORDER — ACETAMINOPHEN 325 MG PO TABS
650.0000 mg | ORAL_TABLET | ORAL | Status: DC | PRN
Start: 2021-06-04 — End: 2021-06-10

## 2021-06-04 MED ORDER — LEVOTHYROXINE SODIUM 100 MCG PO TABS
100.0000 ug | ORAL_TABLET | Freq: Every day | ORAL | 0 refills | Status: DC
  Filled 2021-06-04: qty 30, 30d supply, fill #0

## 2021-06-04 MED ORDER — TRAMADOL HCL 50 MG PO TABS
50.0000 mg | ORAL_TABLET | Freq: Four times a day (QID) | ORAL | 0 refills | Status: DC | PRN
  Filled 2021-06-04: qty 30, 8d supply, fill #0

## 2021-06-04 MED ORDER — PROPRANOLOL HCL 10 MG PO TABS
10.0000 mg | ORAL_TABLET | Freq: Every day | ORAL | 0 refills | Status: DC
  Filled 2021-06-04: qty 30, 30d supply, fill #0

## 2021-06-04 MED ORDER — AMITRIPTYLINE HCL 25 MG PO TABS
25.0000 mg | ORAL_TABLET | Freq: Every day | ORAL | 0 refills | Status: DC
  Filled 2021-06-04: qty 30, 30d supply, fill #0

## 2021-06-04 MED ORDER — TAMSULOSIN HCL 0.4 MG PO CAPS
0.4000 mg | ORAL_CAPSULE | Freq: Every day | ORAL | 0 refills | Status: DC
  Filled 2021-06-04: qty 30, 30d supply, fill #0

## 2021-06-04 MED ORDER — LIDOCAINE 5 % EX PTCH
2.0000 | MEDICATED_PATCH | CUTANEOUS | 0 refills | Status: DC
  Filled 2021-06-04: qty 30, 15d supply, fill #0

## 2021-06-04 MED ORDER — MELATONIN 3 MG PO TABS
3.0000 mg | ORAL_TABLET | Freq: Every day | ORAL | 0 refills | Status: DC
  Filled 2021-06-04: qty 30, 30d supply, fill #0

## 2021-06-04 MED ORDER — PANTOPRAZOLE SODIUM 40 MG PO TBEC
40.0000 mg | DELAYED_RELEASE_TABLET | Freq: Two times a day (BID) | ORAL | 0 refills | Status: DC
  Filled 2021-06-04: qty 60, 30d supply, fill #0

## 2021-06-04 NOTE — Progress Notes (Signed)
PROGRESS NOTE   Subjective/Complaints: Excited to go home tomorrow! Sleeping much better C/o low back pain- kpad ordered and Diane Todd to help with stretching during her session  ROS: Patient denies fever, rash, sore throat, blurred vision, nausea, vomiting, diarrhea, cough, shortness of breath or chest pain, headache, or mood change. +low back pain   Objective:   VAS [place] LOWER EXTREMITY VENOUS (DVT)  Result Date: 06/03/2021  Lower Venous DVT Study Patient Name:  Diane Todd  Date of Exam:   06/03/2021 Medical Rec #: [id number]            Accession #:    [id number] Date of Birth: 07/06/2001            Patient Gender: F Patient Age:   20Y Exam Location:  [facility] Procedure:      VAS [place] LOWER EXTREMITY VENOUS (DVT) Referring Phys: [id number] Earna Coder T [id number] --------------------------------------------------------------------------------  Indications: Swelling.  Risk Factors: None identified. Comparison Study: No prior studies. Performing Technologist: Chanda Busing RVT  Examination Guidelines: A complete evaluation includes B-mode imaging, spectral Doppler, color Doppler, and power Doppler as needed of all accessible portions of each vessel. Bilateral testing is considered an integral part of a complete examination. Limited examinations for reoccurring indications may be performed as noted. The reflux portion of the exam is performed with the patient in reverse Trendelenburg.  +---------+---------------+---------+-----------+----------+--------------+ RIGHT    CompressibilityPhasicitySpontaneityPropertiesThrombus Aging +---------+---------------+---------+-----------+----------+--------------+ CFV      Full           Yes      Yes                                 +---------+---------------+---------+-----------+----------+--------------+ SFJ      Full                                                         +---------+---------------+---------+-----------+----------+--------------+ FV Prox  Full                                                        +---------+---------------+---------+-----------+----------+--------------+ FV Mid   Full                                                        +---------+---------------+---------+-----------+----------+--------------+ FV DistalFull                                                        +---------+---------------+---------+-----------+----------+--------------+ PFV  Full                                                        +---------+---------------+---------+-----------+----------+--------------+ POP      Full           Yes      Yes                                 +---------+---------------+---------+-----------+----------+--------------+ PTV      Full                                                        +---------+---------------+---------+-----------+----------+--------------+ PERO     Full                                                        +---------+---------------+---------+-----------+----------+--------------+   +---------+---------------+---------+-----------+----------+--------------+ LEFT     CompressibilityPhasicitySpontaneityPropertiesThrombus Aging +---------+---------------+---------+-----------+----------+--------------+ CFV      Full           Yes      Yes                                 +---------+---------------+---------+-----------+----------+--------------+ SFJ      Full                                                        +---------+---------------+---------+-----------+----------+--------------+ FV Prox  Full                                                        +---------+---------------+---------+-----------+----------+--------------+ FV Mid   Full                                                         +---------+---------------+---------+-----------+----------+--------------+ FV DistalFull                                                        +---------+---------------+---------+-----------+----------+--------------+ PFV      Full                                                        +---------+---------------+---------+-----------+----------+--------------+  POP      Full           Yes      Yes                                 +---------+---------------+---------+-----------+----------+--------------+ PTV      Full                                                        +---------+---------------+---------+-----------+----------+--------------+ PERO     Full                                                        +---------+---------------+---------+-----------+----------+--------------+     Summary: RIGHT: - There is no evidence of deep vein thrombosis in the lower extremity.  - No cystic structure found in the popliteal fossa.  LEFT: - There is no evidence of deep vein thrombosis in the lower extremity.  - No cystic structure found in the popliteal fossa.  *See table(s) above for measurements and observations. Electronically signed by Fabienne Brunsharles Fields MD on 06/03/2021 at 7:52:25 PM.    Final    Recent Labs    06/03/21 0516  WBC 7.0  HGB 11.9*  HCT 38.5  PLT 333     Recent Labs    06/03/21 0516  NA 138  K 4.8  CL 104  CO2 25  GLUCOSE 80  BUN 9  CREATININE 1.24*  CALCIUM 9.0      Intake/Output Summary (Last 24 hours) at 06/04/2021 1510 Last data filed at 06/04/2021 0939 Gross per 24 hour  Intake 240 ml  Output --  Net 240 ml         Physical Exam: Vital Signs Blood pressure 106/67, pulse (!) 108, temperature 98.1 F (36.7 C), resp. rate 18, height 5\' 7"  (1.702 m), weight 94.5 kg, SpO2 99 %. Gen: no distress, normal appearing HEENT: oral mucosa pink and moist, NCAT Cardio: Tachycardic Chest: normal effort, normal rate of breathing Abd: soft,  non-distended Ext: no edema Psych: pleasant, normal affect Skin: intact Neuro: alert, oriented to person, place, mo/year. Fair insight and awareness. Blurred vision but diplopia less today, tracking better to all fields. Motor 4- to 4/5 UE's and + to 4/5 LE's. Sensory 1+/2 LE essentially stable.. DTR's 3+ in all 4's.  Musculoskeletal: Full ROM, No pain with AROM or PROM in the neck, trunk, or extremities. Posture appropriate. Moves neck more freely, neck less tender      Assessment/Plan: 1. Functional deficits which require 3+ hours per day of interdisciplinary therapy in a comprehensive inpatient rehab setting. Physiatrist is providing close team supervision and 24 hour management of active medical problems listed below. Physiatrist and rehab team continue to assess barriers to discharge/monitor patient progress toward functional and medical goals  Care Tool:  Bathing    Body parts bathed by patient: Right arm, Left arm, Chest, Abdomen, Front perineal area, Buttocks, Right upper leg, Left upper leg, Right lower leg, Left lower leg, Face   Body parts bathed by helper: Buttocks Body parts n/a: Right arm, Left arm, Chest, Abdomen, Front perineal  area, Buttocks, Right upper leg, Left upper leg, Right lower leg, Left lower leg   Bathing assist Assist Level: Supervision/Verbal cueing     Upper Body Dressing/Undressing Upper body dressing   What is the patient wearing?: Pull over shirt, Bra    Upper body assist Assist Level: Set up assist    Lower Body Dressing/Undressing Lower body dressing      What is the patient wearing?: Pants, Underwear/pull up     Lower body assist Assist for lower body dressing: Contact Guard/Touching assist     Toileting Toileting    Toileting assist Assist for toileting: Moderate Assistance - Patient 50 - 74%     Transfers Chair/bed transfer  Transfers assist  Chair/bed transfer activity did not occur: N/A  Chair/bed transfer assist level:  Supervision/Verbal cueing Chair/bed transfer assistive device: Arboriculturist assist      Assist level: Supervision/Verbal cueing Assistive device: Walker-rolling Max distance: 327ft   Walk 10 feet activity   Assist     Assist level: Supervision/Verbal cueing Assistive device: Walker-rolling   Walk 50 feet activity   Assist Walk 50 feet with 2 turns activity did not occur: Safety/medical concerns  Assist level: Supervision/Verbal cueing Assistive device: Walker-rolling    Walk 150 feet activity   Assist Walk 150 feet activity did not occur: Safety/medical concerns  Assist level: Supervision/Verbal cueing Assistive device: Walker-rolling    Walk 10 feet on uneven surface  activity   Assist Walk 10 feet on uneven surfaces activity did not occur: Safety/medical concerns   Assist level: Supervision/Verbal cueing Assistive device: Photographer Will patient use wheelchair at discharge?: No Type of Wheelchair: Manual Wheelchair activity did not occur: N/A  Wheelchair assist level: Contact Guard/Touching assist Max wheelchair distance: 165ft    Wheelchair 50 feet with 2 turns activity    Assist    Wheelchair 50 feet with 2 turns activity did not occur: N/A   Assist Level: Contact Guard/Touching assist   Wheelchair 150 feet activity     Assist  Wheelchair 150 feet activity did not occur: N/A   Assist Level: Maximal Assistance - Patient 25 - 49% (pt propels 50 ft (33%))   Blood pressure 106/67, pulse (!) 108, temperature 98.1 F (36.7 C), resp. rate 18, height 5\' 7"  (1.702 m), weight 94.5 kg, SpO2 99 %.  Medical Problem List and Plan: 1.  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  Patient received 5 days IV Solu-Medrol/5 treatments and plasmapheresis             -patient may shower             -ELOS/Goals: 06/05/21, supervision to min assist with PT and  OT  Continue CIR- PT, OT and SLP 2. Impaired mobility: continue SCDs  -check venous dopplers given low grade fever 3. Pain at bilateral temples/bilateral SCM's: may be secondary to bruxism- discussed with therapy- we do not have mouth guards here.  - increased amitriptyline to 25mg  -continue stretches as we've reviewed and with therapy  -might be worthwhile to have family bring in mouth piece from outside -Discussed using tylenol PRN- this helped.  7/16- is having more pain on back- will con't tylenol prn, however add Lidoderm patches x2 8pm to 8am and try tramadol low dose q6 hours prn for bad pain. Pain is better with lidoderm and tramadol prn,heat, ROM. Add kpad.  4. Anxiety: increase amitriptyline to 25mg  HS--seems to help             -  antipsychotic agents: N/A 5. Neuropsych: This patient is capable of making decisions on her own behalf. 6. Skin/Wound Care: Routine skin checks 7. Fluids/Electrolytes/Nutrition: Routine in and outs with follow-up chemistries 8.  Hypertension. Resolved, now hypotensive. D/c'ed Toprol.   -bp controlled 7/18 9.  Hypothyroidism.  ContinuevSynthroid. 10.  H. pylori.  Treatment initiated 6/24.  Amoxicillin 1 g, Biaxin 500 mg twice daily, PPI twice daily x14 days total 11.  UTI/ESBL/E. Coli/urinary retention.  Contact precautions.  Patient completed course of fosfomycin.  Foley discontinued             -UC with no growth.              -continue trial of flomax   7/13 voiding with low pvr's---can dc urecholine  q2H voiding while awake  7/18 UA +, UCX with insignificant growth. Had low grade fever yesterday -on macrodantin empirically--continue -will resend urine for cx- in process 12.  Obesity.  BMI 31.28.  Dietary follow-up 13.  CKD stage III.    Nursing order placed to encourage 6-8 glasses of water per day.   -7/18 9/1.24---Placed nursing order to encouraged 6-8 glasses of water daily. Repeat tomorrow prior to d/c 14.  Tobacco abuse.  Continue  counseling 15. Hypokalemia: klor supplemented, resolved.  16. Vitamin B1 deficiency: continue thiamin 100mg  daily 17. Insomnia: continue amitriptyline 10mg  HS 18. Tachycardia: pt is asymptomatic--up to 110's with exertion Continue low dose propranolol    -avoid hypotension    LOS: 18 days A FACE TO FACE EVALUATION WAS PERFORMED  Diane Todd Diane Todd 06/04/2021, 3:10 PM

## 2021-06-04 NOTE — Progress Notes (Signed)
Patient ID: Diane Todd, female   DOB: 09/05/2001, 20 y.o.   MRN: [id number] Met wit pt and sister to confirm discharge tomorrow goals met and education completed. Team conference update and both feel ready for discharge tomorrow.

## 2021-06-04 NOTE — Progress Notes (Signed)
Inpatient Rehabilitation Care Coordinator Discharge Note  The overall goal for the admission was met for:   Discharge location: Yes-HOME WITH PARENTS AND SISTER-24/7 CARE  Length of Stay: Yes-19 DAYS  Discharge activity level: Yes-CGA-MIN LEVEL  Home/community participation: Yes  Services provided included: MD, RD, PT, OT, SLP, RN, CM, TR, Pharmacy, and SW  Financial Services: Medicaid  Choices offered to/list presented to:pt and sister  Follow-up services arranged: Outpatient: [facility]-PT,OT,SP, DME: [facility], [facility] IN [facility] AND [facility], and Patient/Family has no preference for HH/DME agencies  Comments (or additional information):SISTER WAS HERE DAILY WITH PT AND WAS EDUCATED ON HER CARE.   Patient/Family verbalized understanding of follow-up arrangements: Yes  Individual responsible for coordination of the follow-up plan: IRIS-SISTER [phone]  Confirmed correct DME delivered: Elease Hashimoto 06/04/2021    Diane Todd, Gard[place] Rhyme

## 2021-06-04 NOTE — Progress Notes (Signed)
Speech Language Pathology Discharge Summary  Patient Details  Name: Diane Todd MRN: [id number] Date of Birth: [birth year]  Today's Date: 06/04/2021 SLP Individual Time: 1306-1400 SLP Individual Time Calculation (min): 54 min  Skilled Therapeutic Interventions: Patient agreeable to skilled ST intervention with focus on cognitive goals. Patient accompanied by her sister. Subtests of the Cognitive Linguistic Quick Test (CLQT) was administered to assess the relative status of five cognitive domains: attention, memory, language, executive functioning, and visuospatial skills. Unable to complete in it's entirety due to time restraints. See below.  Personal facts: 8/8  Symbol Cancellation: 12/12  Confrontation Naming: 10/10 Clock Drawing: 11/13 Story Retelling: 7/10 Generative Naming: 7/9   [facility] Mental Status (SLUMS) re-administered: 27/30 (score of <27 considered impairment based on patient's educational history). Patient scored 26/30 during initial evaluation on 05/20/21.   Orientation: 3/3 Word list recall: 5/5 (improved) Calculations: 2/3 Generative Naming: 3/3 Mental manipulation/digit reversal: 2/2 Clock Drawing/Executive Function: 2/4 Visuospatial: 2/2 Paragraph Recall: 8/8  Subjectively, patient demonstrates improvement with information processing, attention, short-term recall, and problem solving. Patient benefits from sup A verbal cues and repetition for higher level/complex cognitive tasks. Patient continues to develop in self confidence and continues to be hesitant when completing tasks at times. Educated patient and sister on results of evaluations and reinforced memory strategies for home. Patient was left in bed with alarm activated and immediate needs within reach at end of session.   Patient has met 4 of 4 long term goals.  Patient to discharge at overall Supervision;Modified Independent level.  Reasons goals not met: NA- all goals met   Clinical  Impression/Discharge Summary: Patient has made excellent gains and has met 4 of 4 long term goals this admission. Patient is currently presenting with mild cognitive deficits. Overall mod I for basic problem solving, emergent awareness, and attention. She is supervision A for short-term recall, complex scheduling, problem solving, executive functions, and error awareness. She continues to benefit from reminders to utilize memory notebook and additional compensatory memory strategies with reminders from her sister. Patient/family education is complete and patient will discharge home with 24 hour supervision from family. Patient would benefit from outpatient SLP services to maximize cognitive function in order to maximize functional independence.      Care Partner:  Caregiver Able to Provide Assistance: Yes  Type of Caregiver Assistance: Cognitive;Physical  Recommendation:  Outpatient SLP  Rationale for SLP Follow Up: Maximize cognitive function and independence   Equipment: NA   Reasons for discharge: Treatment goals met   Patient/Family Agrees with Progress Made and Goals Achieved: Yes    Patty Sermons, M.S., CCC-SLP   Tita Terhaar T Vivien Barretto 06/04/2021, 7:44 AM

## 2021-06-04 NOTE — Plan of Care (Signed)
  Problem: RH Problem Solving Goal: LTG Patient will demonstrate problem solving for (SLP) Description: LTG:  Patient will demonstrate problem solving for basic/complex daily situations with cues  (SLP) Outcome: Completed/Met   Problem: RH Memory Goal: LTG Patient will demonstrate ability for day to day (SLP) Description: LTG:   Patient will demonstrate ability for day to day recall/carryover during cognitive/linguistic activities with assist  (SLP) Outcome: Completed/Met Goal: LTG Patient will use memory compensatory aids to (SLP) Description: LTG:  Patient will use memory compensatory aids to recall biographical/new, daily complex information with cues (SLP) Outcome: Completed/Met   Problem: RH Awareness Goal: LTG: Patient will demonstrate awareness during functional activites type of (SLP) Description: LTG: Patient will demonstrate awareness during functional activites type of (SLP) Outcome: Completed/Met

## 2021-06-04 NOTE — Progress Notes (Signed)
Physical Therapy Session Note  Patient Details  Name: Diane Todd MRN: [id number] Date of Birth: Apr 15, 2001  Today's Date: 06/04/2021 PT Individual Time: 0830-0900 PT Individual Time Calculation (min): 30 min   Short Term Goals: Week 1:  PT Short Term Goal 1 (Week 1): Pt will perform bed mobility consistently with MinA PT Short Term Goal 1 - Progress (Week 1): Met PT Short Term Goal 2 (Week 1): Pt will perform sit to stands with consistently with minA PT Short Term Goal 2 - Progress (Week 1): Progressing toward goal PT Short Term Goal 3 (Week 1): Pt will perform stand pivot transfers consistently with minA PT Short Term Goal 3 - Progress (Week 1): Met PT Short Term Goal 4 (Week 1): Pt will complete 4 steps using 3 inch step with ModA, BUE support on rails in order to reach LTG PT Short Term Goal 4 - Progress (Week 1): Met  Skilled Therapeutic Interventions/Progress Updates:    PAIN Pt denies pain Pt initially supine and agreeable to session.  Supine to sit using bedrail and hob elevated w/supervision.  Dons shoes w/set up and supervision.  MD in to round on pt .  Therapist adjusted pt personal walker to proper height. Attempted Sit to stand from bed in lowest position but pt unable to power up.  Elevated slightly and pt performs Sit to stand w/cga.  Gait 153f w/RW and cga, noted core and bilat LE weakness throughout during gait but no buckling or balance loss. Commode transfer w/cga, pt performs clothing management w/supervision, hygiene independently in sitting. Short distance gait to sink where she washes hands and brushes teeth w/supervision standing w/RW. Short distance gait to bed, turns/sit to bed w/cga.  Removes shoes w/supervision.  Sit to supine w/supervision. Pt left supine w/rails up x 3, alarm set, bed in lowest position, and needs in reach.   Therapy Documentation Precautions:  Precautions Precautions: Fall Precaution Comments: Contact  precautions Restrictions Weight Bearing Restrictions: No    Therapy/Group: Individual Therapy BCallie Fielding PTuscarora7/19/2022, 4:30 PM

## 2021-06-04 NOTE — Progress Notes (Signed)
Physical Therapy Discharge Summary  Patient Details  Name: Diane Todd MRN: [id number] Date of Birth: September 21, 2001  Today's Date: 06/04/2021 PT Individual Time: 1100-1155 PT Individual Time Calculation (min): 55 min   Patient has met 9 of 9 long term goals due to improved activity tolerance, improved balance, improved postural control, increased strength, improved attention, improved awareness, and improved coordination. Patient to discharge at an ambulatory level Supervision.   Patient's care partner is independent to provide the necessary physical and cognitive assistance at discharge. Pt's sister has been here every day and attended family education training on 7/15 and verbalized and demonstrated confidence with all tasks to ensure safe discharge home. Pt's sister was also cleared to assist with bathroom transfers on 7/15.   All goals met   Recommendation:  Patient will benefit from ongoing skilled PT services in outpatient setting to continue to advance safe functional mobility, address ongoing impairments in transfers, generalized strengthening, dynamic standing balance/coordination, gait training, NMR, endurance, and to minimize fall risk.  Equipment: RW  Reasons for discharge: treatment goals met  Patient/family agrees with progress made and goals achieved: Yes  Today's Interventions: Received pt supine in bed with sister present at bedside, pt agreeable to PT treatment, and denied any pain during session. Session with emphasis on discharge planning, functional mobility/transfers, generalized strengthening, dynamic standing balance/coordination, simulated car transfers, stair navigation, and improved activity tolerance. Pt performed bed mobility with mod I using bedrails x 2 trials throughout session and donned shoes with set up assist. Pt performed all stand<>pivot transfers with RW and supervision throughout session (except when standing from soft low bed required CGA). Pt  transported to/from room on 5C in [facility] total A for time management purposes. Pt performed simulated car transfer with RW and supervision and ambulated 33f on uneven surfaces (ramp) with RW and supervision. Pt able to stand and pick up small cup from floor with CGA for balance. Pt then navigated 12 steps with R handrail and CGA using a lateral stepping technique. Pt transported to dayroom and transferred on/off Nustep with RW and supervision and performed BUE/LE strengthening on Nustep at workload 4 for 8 minutes for a total of 341 steps for improved cardiovascular endurance. Pt requested to return to bed and transferred WC<>bed stand<>pivot with RW and supervision. Concluded session with pt semi-reclined in bed with all needs within reach.   PT Discharge Precautions/Restrictions Precautions Precautions: Fall Precaution Comments: Contact precautions Restrictions Weight Bearing Restrictions: No Cognition Overall Cognitive Status: Impaired/Different from baseline Arousal/Alertness: Awake/alert Orientation Level: Oriented X4 Memory: Impaired Awareness: Appears intact Problem Solving: Impaired Safety/Judgment: Appears intact Sensation Sensation Light Touch: Appears Intact Proprioception: Appears Intact Coordination Gross Motor Movements are Fluid and Coordinated: No Fine Motor Movements are Fluid and Coordinated: No Coordination and Movement Description: grossly uncoordinated due to mild ataxia, generalized unsteadiness, generalized weakness and deconditioning, and fear/lack of confidence Finger Nose Finger Test: dysmetria bilaterally Heel Shin Test: [facility]  Motor Motor: Ataxia;Abnormal postural alignment and control Motor - Skilled Clinical Observations: grossly uncoordinated due to mild ataxia, generalized unsteadiness, generalized weakness and deconditioning, and fear/lack of confidence  Mobility Bed Mobility Bed Mobility: Rolling Right;Rolling Left;Supine to Sit;Sit to  Supine Rolling Right: Independent with assistive device Rolling Left: Independent with assistive device Supine to Sit: Independent with assistive device Sit to Supine: Independent with assistive device Transfers Transfers: Sit to Stand;Stand to Sit;Stand Pivot Transfers Sit to Stand: Supervision/Verbal cueing Stand to Sit: Supervision/Verbal cueing Stand Pivot Transfers: Supervision/Verbal cueing Stand Pivot Transfer  Details: Verbal cues for technique Stand Pivot Transfer Details (indicate cue type and reason): verbal cues for anterior weight shifting Transfer (Assistive device): Rolling walker Locomotion  Gait Ambulation: Yes Gait Assistance: Supervision/Verbal cueing Gait Distance (Feet): 360 Feet Assistive device: Rolling walker Gait Assistance Details: Verbal cues for precautions/safety Gait Assistance Details: occasional verbal cues for energy conservation and enouragement to take standing rest breaks when needed Gait Gait: Yes Gait Pattern: Impaired Gait Pattern: Step-through pattern;Decreased step length - right;Decreased step length - left;Ataxic;Trendelenburg;Poor foot clearance - right;Poor foot clearance - left;Narrow base of support;Decreased trunk rotation;Decreased stride length Gait velocity: decreased Stairs / Additional Locomotion Stairs: Yes Stairs Assistance: Contact Guard/Touching assist Stair Management Technique: One rail Right Number of Stairs: 12 Height of Stairs: 6 Ramp: Supervision/Verbal cueing (RW) Wheelchair Mobility Wheelchair Mobility: No (N/A)  Trunk/Postural Assessment  Cervical Assessment Cervical Assessment: Within Functional Limits Thoracic Assessment Thoracic Assessment: Exceptions to [facility] (rounded shoulders) Lumbar Assessment Lumbar Assessment: Exceptions to [facility] (posterior pelvic tilt in sitting) Postural Control Postural Control: Deficits on evaluation (decreased postural sway/control with increased fatigue)  Balance Balance Balance  Assessed: Yes Standardized Balance Assessment Standardized Balance Assessment: Timed Up and Go Test Timed Up and Go Test TUG: Normal TUG Normal TUG (seconds): 26.3 (RW) Static Sitting Balance Static Sitting - Balance Support: Feet supported;Bilateral upper extremity supported Static Sitting - Level of Assistance: 7: Independent Dynamic Sitting Balance Dynamic Sitting - Balance Support: Feet supported;Bilateral upper extremity supported Dynamic Sitting - Level of Assistance: 6: Modified independent (Device/Increase time) Static Standing Balance Static Standing - Balance Support: Bilateral upper extremity supported (RW) Static Standing - Level of Assistance: 5: Stand by assistance (supervision) Dynamic Standing Balance Dynamic Standing - Balance Support: Bilateral upper extremity supported (RW) Dynamic Standing - Level of Assistance: 5: Stand by assistance (supervision) Dynamic Standing - Comments: with transfers and gait Extremity Assessment  RLE Assessment RLE Assessment: Exceptions to [facility] General Strength Comments: grossly generalized to 4+/5 LLE Assessment LLE Assessment: Exceptions to [facility] General Strength Comments: grossly generalized to 4+/5    Alfonse Alpers PT, DPT  06/04/2021, 7:40 AM

## 2021-06-04 NOTE — Patient Care Conference (Signed)
Inpatient RehabilitationTeam Conference and Plan of Care Update Date: 06/04/2021   Time: 1:38 PM    Patient Name: Diane Todd      Medical Record Number: [id number]  Date of Birth: November 19, 2000 Sex: Female         Room/Bed: 66C07C/5C07C-01 Payor Info: Payor: [place] / Plan: [place] MEDICAID [facility] / Product Type: *No Product type* /    Admit Date/Time:  05/17/2021  4:54 PM  Primary Diagnosis:  Neuromyelitis optica (devic) [facility])  Hospital Problems: Principal Problem:   Neuromyelitis optica (devic) [facility])    Expected Discharge Date: Expected Discharge Date: 06/05/21  Team Members Present: Physician leading conference: Dr. Leeroy Cha Care Coodinator Present: Ovidio Kin, LCSW;Zykerria Tanton Creig Hines, RN, BSN, CRRN Nurse Present: Dorthula Nettles, RN PT Present: Becky Sax, PT OT Present: Simonne Come, OT SLP Present: Charolett Bumpers, SLP PPS Coordinator present : Gunnar Fusi, SLP     Current Status/Progress Goal Weekly Team Focus  Bowel/Bladder   continent b/b  remain continent      Swallow/Nutrition/ Hydration             ADL's   CGA UB and LB bathing, set-up UB dressing, CGA LB dressing, CGA for sit > stand transfers from varying heights. Able to manage anxiety before becoming overwhelmed  Supervision balance, CGA bathing, Ind UB dressing, CGA LB dressing, CGA for toileting and toilet transfers  D/C planning, endurance, emotional support, FMC, functional transfers   Mobility   bed mobility supervision, transfers with RW CGA/supervision, gait >146f with RW supervision, 8 steps with 1 rail CGA  supervision, CGA stairs  functional mobility/transfers, generalized strengthening, dynamic standing balance/coordination, gait training, NMR, self efficacy, discharge planning, family education training, and improved activity tolerance.   Communication             Safety/Cognition/ Behavioral Observations  Supervision A - Mod I  Mod I, supervision A recall   education   Pain   no c/o pain  remain pain freee  assess pain q 4 hr and prn   Skin   mild red bottom  skin remain intact  assess q shift and prn     Discharge Planning:  Sister has been atrending therpaies and feels prepared for discharge tomorrow. Will go to OP for therapies   Team Discussion: Eating better, sleeping better, low-back pain and added K-pad. Continent B/B, no complaints of pain, and no skin issues.  Patient on target to meet rehab goals: yes, met all goals. Family education with sister complete. Ready for discharge. Anxious but ready for discharge. SLP reports education is complete.  *See Care Plan and progress notes for long and short-term goals.   Revisions to Treatment Plan:  Medically ready for discharge.  Teaching Needs: Education complete.  Current Barriers to Discharge: Decreased caregiver support, Medical stability, Home enviroment access/layout, Lack of/limited family support, Weight, Medication compliance, and Behavior  Possible Resolutions to Barriers: Continue current medications, provide emotional support.     Medical Summary Current Status: low back pain, insomnia improved, urinating better, still with anxiety, BMI 32.63  Barriers to Discharge: Medical stability;Behavior;Weight  Barriers to Discharge Comments: low back pain, anxiety, BMI 32.63 Possible Resolutions to BCel[employer] CorporationFocus: add kpad, continue amitriptyline, stable for d/c tomorrow, provide dietary education   Continued Need for Acute Rehabilitation Level of Care: The patient requires daily medical management by a physician with specialized training in physical medicine and rehabilitation for the following reasons: Direction of a multidisciplinary physical rehabilitation program to maximize  functional independence : Yes Medical management of patient stability for increased activity during participation in an intensive rehabilitation regime.: Yes Analysis of laboratory values and/or  radiology reports with any subsequent need for medication adjustment and/or medical intervention. : Yes   I attest that I was present, lead the team conference, and concur with the assessment and plan of the team.   Cristi Loron 06/04/2021, 2:41 PM

## 2021-06-04 NOTE — Progress Notes (Signed)
Occupational Therapy Discharge Summary  Patient Details  Name: Diane Todd MRN: [id number] Date of Birth: 04-11-2001  Today's Date: 06/04/2021 OT Individual Time: 2536-6440 OT Individual Time Calculation (min): 57 min   Patient has met 11 of 12 long term goals due to improved activity tolerance, improved balance, postural control, and improved coordination.  Patient to discharge at overall  CGA  level.  Patient's care partner is independent to provide the necessary physical and cognitive assistance at discharge.  Patient's sister present to verbalize understanding of education.   Reasons goals not met: Required set-up assist for UB dressing due to anxiety  Recommendation:  Patient will benefit from ongoing skilled OT services in outpatient setting to continue to advance functional skills in the area of BADL, iADL, Vocation, and Reduce care partner burden.  Equipment: 3-in-1, Tub/shower bench  Reasons for discharge: treatment goals met  Patient/family agrees with progress made and goals achieved: Yes OT Discharge Precautions/Restrictions  Precautions Precautions: Fall Precaution Comments: Contact precautions Restrictions Weight Bearing Restrictions: No Pain Pain Assessment Pain Scale: 0-10 Pain Score: 0-No pain ADL ADL Eating: Set up Where Assessed-Eating: Bed level Grooming: Independent Where Assessed-Grooming: Standing at sink Upper Body Bathing: Setup Where Assessed-Upper Body Bathing: Shower Lower Body Bathing: Supervision/safety Where Assessed-Lower Body Bathing: Shower Upper Body Dressing: Setup Where Assessed-Upper Body Dressing: Other (Comment) Lower Body Dressing: Contact guard Where Assessed-Lower Body Dressing: Edge of bed Toileting: Contact guard Where Assessed-Toileting: Bedside Commode Toilet Transfer: Therapist, music Method: Counselling psychologist: Bedside commode Tub/Shower Transfer: Museum/gallery curator Method: Optometrist: Facilities manager: Curator Method: Heritage manager: Civil engineer, contracting with back Vision Baseline Vision/History: No visual deficits Tracking/Visual Pursuits: Able to track stimulus in all quads without difficulty Saccades: Within functional limits Convergence: Impaired (comment) Visual Fields: No apparent deficits Perception  Perception: Within Functional Limits Praxis Praxis: Impaired Praxis-Other Comments: Impaired FMC Cognition Overall Cognitive Status: Impaired/Different from baseline Arousal/Alertness: Awake/alert Orientation Level: Oriented X4 Selective Attention: Appears intact Memory: Impaired Memory Impairment: Storage deficit;Retrieval deficit;Decreased long term memory;Decreased short term memory Awareness: Appears intact Problem Solving: Impaired Safety/Judgment: Appears intact Sensation Sensation Light Touch: Appears Intact Hot/Cold: Appears Intact Proprioception: Appears Intact Stereognosis: Appears Intact Coordination Gross Motor Movements are Fluid and Coordinated: No Fine Motor Movements are Fluid and Coordinated: No Coordination and Movement Description: grossly uncoordinated due to mild ataxia, generalized unsteadiness, generalized weakness and deconditioning, and fear/lack of confidence Finger Nose Finger Test: dysmetria bilaterally Heel Shin Test: WFL bilaterally Motor  Motor Motor: Ataxia;Abnormal postural alignment and control Motor - Skilled Clinical Observations: grossly uncoordinated due to mild ataxia, generalized unsteadiness, generalized weakness and deconditioning, and fear/lack of confidence Mobility  Bed Mobility Bed Mobility: Rolling Right;Rolling Left;Supine to Sit;Sit to Supine Rolling Right: Independent with assistive device Rolling Left: Independent with assistive device Supine to Sit: Independent with assistive  device Sit to Supine: Independent with assistive device Transfers Sit to Stand: Supervision/Verbal cueing Stand to Sit: Supervision/Verbal cueing  Trunk/Postural Assessment  Cervical Assessment Cervical Assessment: Within Functional Limits Thoracic Assessment Thoracic Assessment: Exceptions to [facility] Lumbar Assessment Lumbar Assessment: Exceptions to [facility] Postural Control Postural Control: Deficits on evaluation  Balance Balance Balance Assessed: Yes Standardized Balance Assessment Standardized Balance Assessment: Timed Up and Go Test Timed Up and Go Test TUG: Normal TUG Normal TUG (seconds): 26.3 (RW) Static Sitting Balance Static Sitting - Balance Support: Feet supported;Bilateral upper extremity supported Static Sitting - Level of Assistance: 7:  Independent Dynamic Sitting Balance Dynamic Sitting - Balance Support: Feet supported;Bilateral upper extremity supported Dynamic Sitting - Level of Assistance: 6: Modified independent (Device/Increase time) Dynamic Sitting - Balance Activities: Reaching for objects Static Standing Balance Static Standing - Balance Support: Bilateral upper extremity supported Static Standing - Level of Assistance: 5: Stand by assistance Dynamic Standing Balance Dynamic Standing - Balance Support: Bilateral upper extremity supported Dynamic Standing - Level of Assistance: 5: Stand by assistance Dynamic Standing - Balance Activities: Lateral lean/weight shifting;Reaching for objects Dynamic Standing - Comments: with transfers and gait Extremity/Trunk Assessment RUE Assessment RUE Assessment: Within Functional Limits Active Range of Motion (AROM) Comments: grossly WFL for ADL General Strength Comments: 4/5 in shoulder flexion, elbow extension LUE Assessment LUE Assessment: Within Functional Limits Active Range of Motion (AROM) Comments: grossly WFL for ADL General Strength Comments: 4/5 in shoulder flexion, elbow extension  Skilled Therapeutic  Interventions/Progress Updates: Met pt side-lying in bed, pt agreed to session. Treatment session was focused on self-care retraining and d/c planning. Pt's sister present during session for further time to ask questions and for hands-on training. Pt was supervision for bed mobility. Pt ws CGA for sit > stand transfer from EOB > RW, pt did not request a break before standing. Sister assisted with ambulation to bathroom using RW. Pt able to side-step into shower and sit on shower chair. Pt doffed UB clothing with supervision while sitting and doffed LB clothing with CGA stand > sit. Pt completed UB and LB bathing with supervision. Pt donned UB clothing with supervision sitting on shower chair. Pt ambulated with RW to EOB to donn LB clothing with CGA in sit > stand form. Pt donned and doffed footwear with set-up assist. Pt groomed with set-up assist sitting EOB. OT and OTS provided encouragement for pt upon d/c and reminders to utilize everyday activities to improve [facility]. Pt was left sitting EOB, bed alarm on, all needs met.   Tylan Briguglio 06/04/2021, 11:38 AM

## 2021-06-05 LAB — URINE CULTURE: Culture: 20000 — AB

## 2021-06-05 LAB — BASIC METABOLIC PANEL
Anion gap: 10 (ref 5–15)
BUN: 8 mg/dL (ref 6–20)
CO2: 25 mmol/L (ref 22–32)
Calcium: 9.1 mg/dL (ref 8.9–10.3)
Chloride: 105 mmol/L (ref 98–111)
Creatinine, Ser: 1.22 mg/dL — ABNORMAL HIGH (ref 0.44–1.00)
GFR, Estimated: >60 mL/min (ref >60)
Glucose, Bld: 92 mg/dL (ref 70–99)
Potassium: 3.8 mmol/L (ref 3.5–5.1)
Sodium: 140 mmol/L (ref 135–145)

## 2021-06-05 LAB — GLUCOSE, CAPILLARY: Glucose-Capillary: 88 mg/dL (ref 70–99)

## 2021-06-05 NOTE — Progress Notes (Signed)
Patient discharged home with sister this morning at 11:30. Information packet given to patient per PA, no questions noted. Taken down via wheelchair. Diane Todd

## 2021-06-06 LAB — FUNGUS CULTURE WITH STAIN

## 2021-06-06 LAB — FUNGUS CULTURE RESULT

## 2021-06-06 LAB — FUNGAL ORGANISM REFLEX

## 2021-06-07 ENCOUNTER — Telehealth: Payer: Self-pay

## 2021-06-07 NOTE — Telephone Encounter (Signed)
Transition Care Management Unsuccessful Follow-up Telephone Call  Date of discharge and from where:  [facility] 05/[facility]  Attempts:  1st Attempt  Reason for unsuccessful TCM follow-up call:  Left voice message Left  A V/M to sister Iris

## 2021-06-10 ENCOUNTER — Other Ambulatory Visit: Payer: Self-pay

## 2021-06-10 ENCOUNTER — Observation Stay (HOSPITAL_COMMUNITY): Payer: Medicaid Other

## 2021-06-10 ENCOUNTER — Encounter: Payer: Medicaid Other | Attending: Physical Medicine and Rehabilitation | Admitting: Registered Nurse

## 2021-06-10 ENCOUNTER — Inpatient Hospital Stay (HOSPITAL_COMMUNITY)
Admission: EM | Admit: 2021-06-10 | Discharge: 2021-06-16 | DRG: 271 | Disposition: A | Discharge status: Home Health | Payer: Medicaid Other | Attending: Internal Medicine | Admitting: Internal Medicine

## 2021-06-10 ENCOUNTER — Ambulatory Visit: Payer: Medicaid Other | Admitting: Gastroenterology

## 2021-06-10 ENCOUNTER — Encounter (HOSPITAL_COMMUNITY): Payer: Self-pay

## 2021-06-10 ENCOUNTER — Ambulatory Visit (INDEPENDENT_AMBULATORY_CARE_PROVIDER_SITE_OTHER)
Admission: RE | Admit: 2021-06-10 | Discharge: 2021-06-10 | Disposition: A | Discharge status: Home / Self Care | Payer: Medicaid Other | Source: Ambulatory Visit | Attending: Registered Nurse | Admitting: Registered Nurse

## 2021-06-10 ENCOUNTER — Encounter: Payer: Self-pay | Admitting: Registered Nurse

## 2021-06-10 VITALS — BP 110/60 | HR 122 | Temp 99.4°F

## 2021-06-10 DIAGNOSIS — X19XXXA Contact with other heat and hot substances, initial encounter: Secondary | ICD-10-CM | POA: Diagnosis present

## 2021-06-10 DIAGNOSIS — N179 Acute kidney failure, unspecified: Secondary | ICD-10-CM | POA: Diagnosis present

## 2021-06-10 DIAGNOSIS — G360 Neuromyelitis optica [Devic]: Secondary | ICD-10-CM

## 2021-06-10 DIAGNOSIS — I82411 Acute embolism and thrombosis of right femoral vein: Secondary | ICD-10-CM | POA: Diagnosis present

## 2021-06-10 DIAGNOSIS — I82B12 Acute embolism and thrombosis of left subclavian vein: Principal | ICD-10-CM | POA: Diagnosis present

## 2021-06-10 DIAGNOSIS — I82413 Acute embolism and thrombosis of femoral vein, bilateral: Secondary | ICD-10-CM | POA: Diagnosis not present

## 2021-06-10 DIAGNOSIS — H469 Unspecified optic neuritis: Secondary | ICD-10-CM

## 2021-06-10 DIAGNOSIS — Z79890 Hormone replacement therapy (postmenopausal): Secondary | ICD-10-CM

## 2021-06-10 DIAGNOSIS — I82493 Acute embolism and thrombosis of other specified deep vein of lower extremity, bilateral: Secondary | ICD-10-CM | POA: Diagnosis not present

## 2021-06-10 DIAGNOSIS — R000 Tachycardia, unspecified: Secondary | ICD-10-CM | POA: Diagnosis not present

## 2021-06-10 DIAGNOSIS — E861 Hypovolemia: Secondary | ICD-10-CM | POA: Diagnosis present

## 2021-06-10 DIAGNOSIS — Z8679 Personal history of other diseases of the circulatory system: Secondary | ICD-10-CM | POA: Diagnosis not present

## 2021-06-10 DIAGNOSIS — I82409 Acute embolism and thrombosis of unspecified deep veins of unspecified lower extremity: Secondary | ICD-10-CM | POA: Diagnosis present

## 2021-06-10 DIAGNOSIS — I82422 Acute embolism and thrombosis of left iliac vein: Secondary | ICD-10-CM | POA: Diagnosis present

## 2021-06-10 DIAGNOSIS — R2242 Localized swelling, mass and lump, left lower limb: Secondary | ICD-10-CM | POA: Insufficient documentation

## 2021-06-10 DIAGNOSIS — Z79899 Other long term (current) drug therapy: Secondary | ICD-10-CM

## 2021-06-10 DIAGNOSIS — Z20822 Contact with and (suspected) exposure to covid-19: Secondary | ICD-10-CM | POA: Diagnosis present

## 2021-06-10 DIAGNOSIS — I82220 Acute embolism and thrombosis of inferior vena cava: Secondary | ICD-10-CM

## 2021-06-10 DIAGNOSIS — D631 Anemia in chronic kidney disease: Secondary | ICD-10-CM | POA: Diagnosis present

## 2021-06-10 DIAGNOSIS — N1831 Chronic kidney disease, stage 3a: Secondary | ICD-10-CM | POA: Diagnosis present

## 2021-06-10 DIAGNOSIS — I129 Hypertensive chronic kidney disease with stage 1 through stage 4 chronic kidney disease, or unspecified chronic kidney disease: Secondary | ICD-10-CM | POA: Diagnosis present

## 2021-06-10 DIAGNOSIS — T310 Burns involving less than 10% of body surface: Secondary | ICD-10-CM | POA: Diagnosis present

## 2021-06-10 DIAGNOSIS — F17290 Nicotine dependence, other tobacco product, uncomplicated: Secondary | ICD-10-CM | POA: Diagnosis present

## 2021-06-10 DIAGNOSIS — E669 Obesity, unspecified: Secondary | ICD-10-CM | POA: Diagnosis present

## 2021-06-10 DIAGNOSIS — Z6832 Body mass index (BMI) 32.0-32.9, adult: Secondary | ICD-10-CM

## 2021-06-10 DIAGNOSIS — I82432 Acute embolism and thrombosis of left popliteal vein: Secondary | ICD-10-CM | POA: Diagnosis present

## 2021-06-10 DIAGNOSIS — T24231A Burn of second degree of right lower leg, initial encounter: Secondary | ICD-10-CM | POA: Diagnosis present

## 2021-06-10 DIAGNOSIS — I959 Hypotension, unspecified: Secondary | ICD-10-CM | POA: Diagnosis present

## 2021-06-10 DIAGNOSIS — E519 Thiamine deficiency, unspecified: Secondary | ICD-10-CM | POA: Diagnosis present

## 2021-06-10 DIAGNOSIS — E039 Hypothyroidism, unspecified: Secondary | ICD-10-CM | POA: Diagnosis present

## 2021-06-10 DIAGNOSIS — Z8249 Family history of ischemic heart disease and other diseases of the circulatory system: Secondary | ICD-10-CM

## 2021-06-10 HISTORY — DX: Acute embolism and thrombosis of unspecified deep veins of unspecified lower extremity: I82.409

## 2021-06-10 LAB — TROPONIN I (HIGH SENSITIVITY): Troponin I (High Sensitivity): 6 ng/L (ref <18)

## 2021-06-10 LAB — COMPREHENSIVE METABOLIC PANEL
ALT: 26 U/L (ref 0–44)
AST: 50 U/L — ABNORMAL HIGH (ref 15–41)
Albumin: 3.2 g/dL — ABNORMAL LOW (ref 3.5–5.0)
Alkaline Phosphatase: 81 U/L (ref 38–126)
Anion gap: 13 (ref 5–15)
BUN: 29 mg/dL — ABNORMAL HIGH (ref 6–20)
CO2: 21 mmol/L — ABNORMAL LOW (ref 22–32)
Calcium: 9.5 mg/dL (ref 8.9–10.3)
Chloride: 100 mmol/L (ref 98–111)
Creatinine, Ser: 2.37 mg/dL — ABNORMAL HIGH (ref 0.44–1.00)
GFR, Estimated: 29 mL/min — ABNORMAL LOW (ref >60)
Glucose, Bld: 129 mg/dL — ABNORMAL HIGH (ref 70–99)
Potassium: 4.8 mmol/L (ref 3.5–5.1)
Sodium: 134 mmol/L — ABNORMAL LOW (ref 135–145)
Total Bilirubin: 0.8 mg/dL (ref 0.3–1.2)
Total Protein: 7.8 g/dL (ref 6.5–8.1)

## 2021-06-10 LAB — I-STAT BETA HCG BLOOD, ED (MC, WL, AP ONLY): I-stat hCG, quantitative: <5 m[IU]/mL (ref <5)

## 2021-06-10 LAB — CBC
HCT: 46.6 % — ABNORMAL HIGH (ref 36.0–46.0)
Hemoglobin: 14.3 g/dL (ref 12.0–15.0)
MCH: 28.4 pg (ref 26.0–34.0)
MCHC: 30.7 g/dL (ref 30.0–36.0)
MCV: 92.5 fL (ref 80.0–100.0)
Platelets: 481 10*3/uL — ABNORMAL HIGH (ref 150–400)
RBC: 5.04 MIL/uL (ref 3.87–5.11)
RDW: 18.2 % — ABNORMAL HIGH (ref 11.5–15.5)
WBC: 14.7 10*3/uL — ABNORMAL HIGH (ref 4.0–10.5)
nRBC: 0.1 % (ref 0.0–0.2)

## 2021-06-10 MED ORDER — HEPARIN (PORCINE) 25000 UT/250ML-% IV SOLN
1600.0000 [IU]/h | INTRAVENOUS | Status: DC
  Administered 2021-06-10: 1400 [IU]/h via INTRAVENOUS
  Administered 2021-06-12 – 2021-06-13 (×3): 1600 [IU]/h via INTRAVENOUS
  Filled 2021-06-10 (×5): qty 250

## 2021-06-10 MED ORDER — TRAMADOL HCL 50 MG PO TABS
50.0000 mg | ORAL_TABLET | Freq: Four times a day (QID) | ORAL | Status: DC | PRN
Start: 2021-06-10 — End: 2021-06-16
  Administered 2021-06-12 – 2021-06-15 (×2): 50 mg via ORAL
  Filled 2021-06-10 (×2): qty 1

## 2021-06-10 MED ORDER — SODIUM CHLORIDE 0.9 % IV BOLUS
1000.0000 mL | Freq: Once | INTRAVENOUS | Status: AC
  Administered 2021-06-10: 1000 mL via INTRAVENOUS

## 2021-06-10 MED ORDER — HYDROCODONE-ACETAMINOPHEN 5-325 MG PO TABS
1.0000 | ORAL_TABLET | ORAL | Status: DC | PRN
  Administered 2021-06-11: 2 via ORAL
  Administered 2021-06-11: 1 via ORAL
  Administered 2021-06-12: 2 via ORAL
  Administered 2021-06-15: 1 via ORAL
  Administered 2021-06-15: 2 via ORAL
  Filled 2021-06-10: qty 1
  Filled 2021-06-10 (×2): qty 2
  Filled 2021-06-10: qty 1
  Filled 2021-06-10: qty 2

## 2021-06-10 MED ORDER — SODIUM CHLORIDE 0.9 % IV SOLN
75.0000 mL/h | INTRAVENOUS | Status: DC
  Administered 2021-06-11: 75 mL/h via INTRAVENOUS

## 2021-06-10 MED ORDER — ACETAMINOPHEN 650 MG RE SUPP: 650.0000 mg | Freq: Four times a day (QID) | RECTAL | Status: DC | PRN

## 2021-06-10 MED ORDER — PROPRANOLOL HCL 10 MG PO TABS
10.0000 mg | ORAL_TABLET | Freq: Every day | ORAL | Status: DC
  Administered 2021-06-11: 10 mg via ORAL
  Filled 2021-06-10: qty 1

## 2021-06-10 MED ORDER — HEPARIN BOLUS VIA INFUSION
6500.0000 [IU] | Freq: Once | INTRAVENOUS | Status: AC
  Administered 2021-06-10: 6500 [IU] via INTRAVENOUS
  Filled 2021-06-10: qty 6500

## 2021-06-10 MED ORDER — ACETAMINOPHEN 325 MG PO TABS
650.0000 mg | ORAL_TABLET | Freq: Four times a day (QID) | ORAL | Status: DC | PRN
  Administered 2021-06-11 (×2): 650 mg via ORAL
  Filled 2021-06-10 (×2): qty 2

## 2021-06-10 MED ORDER — LEVOTHYROXINE SODIUM 100 MCG PO TABS
100.0000 ug | ORAL_TABLET | Freq: Every day | ORAL | Status: DC
  Administered 2021-06-11 – 2021-06-16 (×6): 100 ug via ORAL
  Filled 2021-06-10 (×6): qty 1

## 2021-06-10 MED ORDER — BACITRACIN ZINC 500 UNIT/GM EX OINT
1.0000 "application " | TOPICAL_OINTMENT | Freq: Two times a day (BID) | CUTANEOUS | Status: DC
  Administered 2021-06-11 – 2021-06-16 (×9): 1 via TOPICAL
  Filled 2021-06-10: qty 1.8
  Filled 2021-06-10: qty 28.4
  Filled 2021-06-10: qty 0.9
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
## 2021-06-10 NOTE — ED Triage Notes (Signed)
Pt states she was seen at [facility], told her she had a DVT in L leg/calf, advised to come to ED. Pt denies CP, SHOB.  9/10 sore, achy in calf.

## 2021-06-10 NOTE — Progress Notes (Deleted)
Primary Care Physician:  Toma Deiters, MD  Primary Gastroenterologist:    No chief complaint on file.   HPI:  Diane Todd is a 20 y.o. female here for further evaluation of nausea/vomiting, left upper quadrant pain.   Patient has been seen by Dr. Zachery Dakins, gastroenterologist in [facility]) on May 2 for initial consultation during hospital admission at [facility] center.  EGD May 12 but erosive gastritis, biopsy significant for H. pylori, mild patchy mucosa with erosion in the duodenal bulb.  Started on amoxicillin, clarithromycin, PPI.  Patient reported only being able to take 5 doses of antibiotics due to nausea and vomiting.  CT abdomen and pelvis without contrast Apr 08, 2021 at [facility]: Impression: No acute intra-abdominal or intrapelvic abnormality with limited evaluation on this noncontrast study.  Current Outpatient Medications  Medication Sig Dispense Refill   acetaminophen (TYLENOL) 325 MG tablet Take 2 tablets (650 mg total) by mouth every 4 (four) hours as needed (discomfort or fever).     amitriptyline (ELAVIL) 25 MG tablet Take 1 tablet (25 mg total) by mouth at bedtime. 30 tablet 0   levothyroxine (SYNTHROID) 100 MCG tablet Take 1 tablet (100 mcg total) by mouth daily. 30 tablet 0   lidocaine (LIDODERM) 5 % Place 2 patches onto the skin daily. Remove & Discard patch within 12 hours or as directed by MD 30 patch 0   melatonin 3 MG TABS tablet Take 1 tablet (3 mg total) by mouth at bedtime. 30 tablet 0   Multiple Vitamin (MULTIVITAMIN WITH MINERALS) TABS tablet Take 1 tablet by mouth daily.     pantoprazole (PROTONIX) 40 MG tablet Take 1 tablet (40 mg total) by mouth 2 (two) times daily. 60 tablet 0   polyethylene glycol (MIRALAX / GLYCOLAX) 17 g packet Take 17 g by mouth 2 (two) times daily.     propranolol (INDERAL) 10 MG tablet Take 1 tablet (10 mg total) by mouth daily. 30 tablet 0   senna-docusate (SENOKOT-S) 8.6-50 MG tablet Take  2 tablets by mouth daily.     tamsulosin (FLOMAX) 0.4 MG CAPS capsule Take 1 capsule (0.4 mg total) by mouth daily after supper. 30 capsule 0   traMADol (ULTRAM) 50 MG tablet Take 1 tablet (50 mg total) by mouth every 6 (six) hours as needed for severe pain. 30 tablet 0   No current facility-administered medications for this visit.    Allergies as of 06/10/2021   (No Known Allergies)    Past Medical History:  Diagnosis Date   Hypertension    Hypothyroidism     Past Surgical History:  Procedure Laterality Date   IR FLUORO GUIDE CV LINE RIGHT  05/08/2021   IR US GUIDE VASC ACCESS RIGHT  05/08/2021    No family history on file.  Social History   Socioeconomic History   Marital status: Single    Spouse name: Not on file   Number of children: Not on file   Years of education: Not on file   Highest education level: Not on file  Occupational History   Not on file  Tobacco Use   Smoking status: Some Days    Types: E-cigarettes   Smokeless tobacco: Never  Vaping Use   Vaping Use: Some days   Substances: Nicotine, Flavoring  Substance and Sexual Activity   Alcohol use: Not Currently   Drug use: Not Currently   Sexual activity: Not Currently  Other Topics Concern   Not on file  Social History  Narrative   Not on file   Social Determinants of Health   Financial Resource Strain: Not on file  Food Insecurity: Not on file  Transportation Needs: Not on file  Physical Activity: Not on file  Stress: Not on file  Social Connections: Not on file  Intimate Partner Violence: Not on file      ROS:  General: Negative for anorexia, weight loss, fever, chills, fatigue, weakness. Eyes: Negative for vision changes.  ENT: Negative for hoarseness, difficulty swallowing , nasal congestion. CV: Negative for chest pain, angina, palpitations, dyspnea on exertion, peripheral edema.  Respiratory: Negative for dyspnea at rest, dyspnea on exertion, cough, sputum, wheezing.  GI: See  history of present illness. GU:  Negative for dysuria, hematuria, urinary incontinence, urinary frequency, nocturnal urination.  MS: Negative for joint pain, low back pain.  Derm: Negative for rash or itching.  Neuro: Negative for weakness, abnormal sensation, seizure, frequent headaches, memory loss, confusion.  Psych: Negative for anxiety, depression, suicidal ideation, hallucinations.  Endo: Negative for unusual weight change.  Heme: Negative for bruising or bleeding. Allergy: Negative for rash or hives.    Physical Examination:  There were no vitals taken for this visit.   General: Well-nourished, well-developed in no acute distress.  Head: Normocephalic, atraumatic.   Eyes: Conjunctiva pink, no icterus. Mouth: Oropharyngeal mucosa moist and pink , no lesions erythema or exudate. Neck: Supple without thyromegaly, masses, or lymphadenopathy.  Lungs: Clear to auscultation bilaterally.  Heart: Regular rate and rhythm, no murmurs rubs or gallops.  Abdomen: Bowel sounds are normal, nontender, nondistended, no hepatosplenomegaly or masses, no abdominal bruits or    hernia , no rebound or guarding.   Rectal: *** Extremities: No lower extremity edema. No clubbing or deformities.  Neuro: Alert and oriented x 4 , grossly normal neurologically.  Skin: Warm and dry, no rash or jaundice.   Psych: Alert and cooperative, normal mood and affect.  Labs: ***  Imaging Studies: VAS [place] LOWER EXTREMITY VENOUS (DVT)  Result Date: 06/03/2021  Lower Venous DVT Study Patient Name:  Diane Todd  Date of Exam:   06/03/2021 Medical Rec #: [id number]            Accession #:    [id number] Date of Birth: 2001/02/25            Patient Gender: F Patient Age:   20Y Exam Location:  [facility] Procedure:      VAS [place] LOWER EXTREMITY VENOUS (DVT) Referring Phys: [id number] Earna Coder T WFUXNA --------------------------------------------------------------------------------  Indications: Swelling.  Risk Factors:  None identified. Comparison Study: No prior studies. Performing Technologist: Chanda Busing RVT  Examination Guidelines: A complete evaluation includes B-mode imaging, spectral Doppler, color Doppler, and power Doppler as needed of all accessible portions of each vessel. Bilateral testing is considered an integral part of a complete examination. Limited examinations for reoccurring indications may be performed as noted. The reflux portion of the exam is performed with the patient in reverse Trendelenburg.  +---------+---------------+---------+-----------+----------+--------------+ RIGHT    CompressibilityPhasicitySpontaneityPropertiesThrombus Aging +---------+---------------+---------+-----------+----------+--------------+ CFV      Full           Yes      Yes                                 +---------+---------------+---------+-----------+----------+--------------+ SFJ      Full                                                        +---------+---------------+---------+-----------+----------+--------------+  FV Prox  Full                                                        +---------+---------------+---------+-----------+----------+--------------+ FV Mid   Full                                                        +---------+---------------+---------+-----------+----------+--------------+ FV DistalFull                                                        +---------+---------------+---------+-----------+----------+--------------+ PFV      Full                                                        +---------+---------------+---------+-----------+----------+--------------+ POP      Full           Yes      Yes                                 +---------+---------------+---------+-----------+----------+--------------+ PTV      Full                                                         +---------+---------------+---------+-----------+----------+--------------+ PERO     Full                                                        +---------+---------------+---------+-----------+----------+--------------+   +---------+---------------+---------+-----------+----------+--------------+ LEFT     CompressibilityPhasicitySpontaneityPropertiesThrombus Aging +---------+---------------+---------+-----------+----------+--------------+ CFV      Full           Yes      Yes                                 +---------+---------------+---------+-----------+----------+--------------+ SFJ      Full                                                        +---------+---------------+---------+-----------+----------+--------------+ FV Prox  Full                                                        +---------+---------------+---------+-----------+----------+--------------+  FV Mid   Full                                                        +---------+---------------+---------+-----------+----------+--------------+ FV DistalFull                                                        +---------+---------------+---------+-----------+----------+--------------+ PFV      Full                                                        +---------+---------------+---------+-----------+----------+--------------+ POP      Full           Yes      Yes                                 +---------+---------------+---------+-----------+----------+--------------+ PTV      Full                                                        +---------+---------------+---------+-----------+----------+--------------+ PERO     Full                                                        +---------+---------------+---------+-----------+----------+--------------+     Summary: RIGHT: - There is no evidence of deep vein thrombosis in the lower extremity.  - No cystic structure found in  the popliteal fossa.  LEFT: - There is no evidence of deep vein thrombosis in the lower extremity.  - No cystic structure found in the popliteal fossa.  *See table(s) above for measurements and observations. Electronically signed by Fabienne Bruns MD on 06/03/2021 at 7:52:25 PM.    Final      Assessment:       Plan:

## 2021-06-10 NOTE — ED Provider Notes (Signed)
Emergency Medicine Provider Triage Evaluation Note  Diane Todd , a 20 y.o. female  was evaluated in triage.  Pt complains of DVT, states that she has it in her left calf.  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  States that pain is severe, has never had DVT before.  Patient is significantly tachycardic, denies chest pain or shortness of breath.  Review of Systems  Positive: DVT Negative:   Physical Exam  BP 106/84 (BP Location: Right Arm)   Pulse (!) 140   Temp 99.5 F (37.5 C) (Oral)   Resp 16   SpO2 100%  Gen:   Awake, no distress , tachycardic to 140, regular rhythm Resp:  Normal effort  MSK:   Left leg with extreme pain, does have some blistering from where she put a heating pad on the area.  Does have some distal edema.  DP pulse 2+ with Doppler.   Medical Decision Making  Medically screening exam initiated at 6:19 PM.  Appropriate orders placed.  Anahita Olguin-Trejo was informed that the remainder of the evaluation will be completed by another provider, this initial triage assessment does not replace that evaluation, and the importance of remaining in the ED until their evaluation is complete.    Farrel Gordon, PA-C 06/10/21 1820    Virgina Norfolk, DO 06/10/21 2328

## 2021-06-10 NOTE — Progress Notes (Signed)
ANTICOAGULATION CONSULT NOTE - Initial Consult  Pharmacy Consult for Heparin Indication:  VTE  No Known Allergies  Patient Measurements:   Heparin Dosing Weight: 82.3 kg  Vital Signs: Temp: 99 F (37.2 C) (07/25 2033) Temp Source: Oral (07/25 2033) BP: 95/77 (07/25 2215) Pulse Rate: 132 (07/25 2215)  Labs: Recent Labs    06/10/21 1812  HGB 14.3  HCT 46.6*  PLT 481*  CREATININE 2.37*    Estimated Creatinine Clearance: 44.7 mL/min (A) (by C-G formula based on SCr of 2.37 mg/dL (H)).   Medical History: Past Medical History:  Diagnosis Date   Hypertension    Hypothyroidism     Medications:  (Not in a hospital admission)  Scheduled:  Infusions:   sodium chloride      Assessment: 20  yof presenting with complaints of a DVT in her left calf found in outpatient setting. Heparin per pharmacy ordered.  Patient not on anticoagulation prior to arrival. CBC stable.  Goal of Therapy:  Heparin level 0.3-0.7 units/ml Monitor platelets by anticoagulation protocol: Yes   Plan:  Give 6500 units bolus x 1 Start heparin infusion at 1400 units/hr Check anti-Xa level in 6 hours and daily while on heparin Continue to monitor H&H and platelets  Delmar Landau, PharmD, BCPS 06/10/2021 10:37 PM ED Clinical Pharmacist -  [phone]
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
## 2021-06-10 NOTE — Progress Notes (Signed)
CV Imaging at [facility] Diane Todd was sent to [place] for a left venous duplex for pain and swelling. Duplex reveals extensive thrombus from the calf veins through the common iliac vein.   Dr. Carlis Abbott instructed the patient to go to the ED. Preliminary report can be found in Chart Review under the CV Proc tab.  Thereasa Parkin, RVT CV Imaging at [facility]
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
## 2021-06-11 ENCOUNTER — Observation Stay (HOSPITAL_COMMUNITY): Payer: Medicaid Other

## 2021-06-11 ENCOUNTER — Inpatient Hospital Stay (HOSPITAL_COMMUNITY): Payer: Medicaid Other

## 2021-06-11 ENCOUNTER — Encounter (HOSPITAL_COMMUNITY): Payer: Self-pay | Admitting: Internal Medicine

## 2021-06-11 DIAGNOSIS — I82493 Acute embolism and thrombosis of other specified deep vein of lower extremity, bilateral: Secondary | ICD-10-CM | POA: Diagnosis not present

## 2021-06-11 DIAGNOSIS — N179 Acute kidney failure, unspecified: Secondary | ICD-10-CM | POA: Diagnosis present

## 2021-06-11 DIAGNOSIS — Z79890 Hormone replacement therapy (postmenopausal): Secondary | ICD-10-CM | POA: Diagnosis not present

## 2021-06-11 DIAGNOSIS — Z20822 Contact with and (suspected) exposure to covid-19: Secondary | ICD-10-CM | POA: Diagnosis present

## 2021-06-11 DIAGNOSIS — N1831 Chronic kidney disease, stage 3a: Secondary | ICD-10-CM | POA: Diagnosis present

## 2021-06-11 DIAGNOSIS — G360 Neuromyelitis optica [Devic]: Secondary | ICD-10-CM | POA: Diagnosis present

## 2021-06-11 DIAGNOSIS — I82B12 Acute embolism and thrombosis of left subclavian vein: Secondary | ICD-10-CM | POA: Diagnosis present

## 2021-06-11 DIAGNOSIS — I82422 Acute embolism and thrombosis of left iliac vein: Secondary | ICD-10-CM | POA: Diagnosis present

## 2021-06-11 DIAGNOSIS — Z8679 Personal history of other diseases of the circulatory system: Secondary | ICD-10-CM | POA: Diagnosis not present

## 2021-06-11 DIAGNOSIS — R000 Tachycardia, unspecified: Secondary | ICD-10-CM | POA: Diagnosis present

## 2021-06-11 DIAGNOSIS — I82403 Acute embolism and thrombosis of unspecified deep veins of lower extremity, bilateral: Secondary | ICD-10-CM | POA: Diagnosis not present

## 2021-06-11 DIAGNOSIS — R0609 Other forms of dyspnea: Secondary | ICD-10-CM | POA: Diagnosis not present

## 2021-06-11 DIAGNOSIS — Z6832 Body mass index (BMI) 32.0-32.9, adult: Secondary | ICD-10-CM | POA: Diagnosis not present

## 2021-06-11 DIAGNOSIS — D631 Anemia in chronic kidney disease: Secondary | ICD-10-CM | POA: Diagnosis present

## 2021-06-11 DIAGNOSIS — T310 Burns involving less than 10% of body surface: Secondary | ICD-10-CM | POA: Diagnosis present

## 2021-06-11 DIAGNOSIS — I82220 Acute embolism and thrombosis of inferior vena cava: Secondary | ICD-10-CM

## 2021-06-11 DIAGNOSIS — I129 Hypertensive chronic kidney disease with stage 1 through stage 4 chronic kidney disease, or unspecified chronic kidney disease: Secondary | ICD-10-CM | POA: Diagnosis present

## 2021-06-11 DIAGNOSIS — I82432 Acute embolism and thrombosis of left popliteal vein: Secondary | ICD-10-CM | POA: Diagnosis present

## 2021-06-11 DIAGNOSIS — F17290 Nicotine dependence, other tobacco product, uncomplicated: Secondary | ICD-10-CM | POA: Diagnosis present

## 2021-06-11 DIAGNOSIS — T24231A Burn of second degree of right lower leg, initial encounter: Secondary | ICD-10-CM | POA: Diagnosis present

## 2021-06-11 DIAGNOSIS — I82402 Acute embolism and thrombosis of unspecified deep veins of left lower extremity: Secondary | ICD-10-CM | POA: Diagnosis not present

## 2021-06-11 DIAGNOSIS — I82411 Acute embolism and thrombosis of right femoral vein: Secondary | ICD-10-CM | POA: Diagnosis present

## 2021-06-11 DIAGNOSIS — E669 Obesity, unspecified: Secondary | ICD-10-CM | POA: Diagnosis present

## 2021-06-11 DIAGNOSIS — X19XXXA Contact with other heat and hot substances, initial encounter: Secondary | ICD-10-CM | POA: Diagnosis present

## 2021-06-11 DIAGNOSIS — Z8249 Family history of ischemic heart disease and other diseases of the circulatory system: Secondary | ICD-10-CM | POA: Diagnosis not present

## 2021-06-11 DIAGNOSIS — E519 Thiamine deficiency, unspecified: Secondary | ICD-10-CM | POA: Diagnosis present

## 2021-06-11 DIAGNOSIS — Z79899 Other long term (current) drug therapy: Secondary | ICD-10-CM | POA: Diagnosis not present

## 2021-06-11 DIAGNOSIS — E861 Hypovolemia: Secondary | ICD-10-CM | POA: Diagnosis present

## 2021-06-11 DIAGNOSIS — I959 Hypotension, unspecified: Secondary | ICD-10-CM | POA: Diagnosis present

## 2021-06-11 DIAGNOSIS — E039 Hypothyroidism, unspecified: Secondary | ICD-10-CM | POA: Diagnosis present

## 2021-06-11 DIAGNOSIS — I82423 Acute embolism and thrombosis of iliac vein, bilateral: Secondary | ICD-10-CM | POA: Diagnosis not present

## 2021-06-11 LAB — COMPREHENSIVE METABOLIC PANEL
ALT: 25 U/L (ref 0–44)
AST: 47 U/L — ABNORMAL HIGH (ref 15–41)
Albumin: 2.9 g/dL — ABNORMAL LOW (ref 3.5–5.0)
Alkaline Phosphatase: 64 U/L (ref 38–126)
Anion gap: 11 (ref 5–15)
BUN: 29 mg/dL — ABNORMAL HIGH (ref 6–20)
CO2: 22 mmol/L (ref 22–32)
Calcium: 8.8 mg/dL — ABNORMAL LOW (ref 8.9–10.3)
Chloride: 104 mmol/L (ref 98–111)
Creatinine, Ser: 2.25 mg/dL — ABNORMAL HIGH (ref 0.44–1.00)
GFR, Estimated: 31 mL/min — ABNORMAL LOW (ref >60)
Glucose, Bld: 109 mg/dL — ABNORMAL HIGH (ref 70–99)
Potassium: 4.6 mmol/L (ref 3.5–5.1)
Sodium: 137 mmol/L (ref 135–145)
Total Bilirubin: 0.8 mg/dL (ref 0.3–1.2)
Total Protein: 6.6 g/dL (ref 6.5–8.1)

## 2021-06-11 LAB — CBC WITH DIFFERENTIAL/PLATELET
Abs Immature Granulocytes: 0.18 10*3/uL — ABNORMAL HIGH (ref 0.00–0.07)
Basophils Absolute: 0.1 10*3/uL (ref 0.0–0.1)
Basophils Relative: 1 %
Eosinophils Absolute: 0.1 10*3/uL (ref 0.0–0.5)
Eosinophils Relative: 1 %
HCT: 40.7 % (ref 36.0–46.0)
Hemoglobin: 12.1 g/dL (ref 12.0–15.0)
Immature Granulocytes: 1 %
Lymphocytes Relative: 14 %
Lymphs Abs: 1.8 10*3/uL (ref 0.7–4.0)
MCH: 27.8 pg (ref 26.0–34.0)
MCHC: 29.7 g/dL — ABNORMAL LOW (ref 30.0–36.0)
MCV: 93.6 fL (ref 80.0–100.0)
Monocytes Absolute: 1.2 10*3/uL — ABNORMAL HIGH (ref 0.1–1.0)
Monocytes Relative: 9 %
Neutro Abs: 10.1 10*3/uL — ABNORMAL HIGH (ref 1.7–7.7)
Neutrophils Relative %: 74 %
Platelets: 359 10*3/uL (ref 150–400)
RBC: 4.35 MIL/uL (ref 3.87–5.11)
RDW: 18 % — ABNORMAL HIGH (ref 11.5–15.5)
WBC: 13.5 10*3/uL — ABNORMAL HIGH (ref 4.0–10.5)
nRBC: 0 % (ref 0.0–0.2)

## 2021-06-11 LAB — URINALYSIS, ROUTINE W REFLEX MICROSCOPIC
Bilirubin Urine: NEGATIVE
Glucose, UA: NEGATIVE mg/dL
Ketones, ur: NEGATIVE mg/dL
Leukocytes,Ua: NEGATIVE
Nitrite: NEGATIVE
Protein, ur: 30 mg/dL — AB
Specific Gravity, Urine: 1.016 (ref 1.005–1.030)
pH: 5 (ref 5.0–8.0)

## 2021-06-11 LAB — CBC
HCT: 35 % — ABNORMAL LOW (ref 36.0–46.0)
Hemoglobin: 10.2 g/dL — ABNORMAL LOW (ref 12.0–15.0)
MCH: 28.3 pg (ref 26.0–34.0)
MCHC: 29.1 g/dL — ABNORMAL LOW (ref 30.0–36.0)
MCV: 97 fL (ref 80.0–100.0)
Platelets: 316 10*3/uL (ref 150–400)
RBC: 3.61 MIL/uL — ABNORMAL LOW (ref 3.87–5.11)
RDW: 18.4 % — ABNORMAL HIGH (ref 11.5–15.5)
WBC: 8.9 10*3/uL (ref 4.0–10.5)
nRBC: 0 % (ref 0.0–0.2)

## 2021-06-11 LAB — BASIC METABOLIC PANEL
Anion gap: 7 (ref 5–15)
BUN: 24 mg/dL — ABNORMAL HIGH (ref 6–20)
CO2: 22 mmol/L (ref 22–32)
Calcium: 8.2 mg/dL — ABNORMAL LOW (ref 8.9–10.3)
Chloride: 110 mmol/L (ref 98–111)
Creatinine, Ser: 1.69 mg/dL — ABNORMAL HIGH (ref 0.44–1.00)
GFR, Estimated: 44 mL/min — ABNORMAL LOW (ref >60)
Glucose, Bld: 79 mg/dL (ref 70–99)
Potassium: 4.4 mmol/L (ref 3.5–5.1)
Sodium: 139 mmol/L (ref 135–145)

## 2021-06-11 LAB — CK: Total CK: 1531 U/L — ABNORMAL HIGH (ref 38–234)

## 2021-06-11 LAB — HEPARIN LEVEL (UNFRACTIONATED)
Heparin Unfractionated: 0.91 IU/mL — ABNORMAL HIGH (ref 0.30–0.70)
Heparin Unfractionated: 0.64 IU/mL (ref 0.30–0.70)
Heparin Unfractionated: 0.58 IU/mL (ref 0.30–0.70)
Heparin Unfractionated: 0.23 IU/mL — ABNORMAL LOW (ref 0.30–0.70)

## 2021-06-11 LAB — RESP PANEL BY RT-PCR (FLU A&B, COVID) ARPGX2
Influenza A by PCR: NEGATIVE
Influenza B by PCR: NEGATIVE
SARS Coronavirus 2 by RT PCR: NEGATIVE

## 2021-06-11 LAB — BRAIN NATRIURETIC PEPTIDE: B Natriuretic Peptide: 13.9 pg/mL (ref 0.0–100.0)

## 2021-06-11 LAB — LACTIC ACID, PLASMA
Lactic Acid, Venous: 1.3 mmol/L (ref 0.5–1.9)
Lactic Acid, Venous: 0.9 mmol/L (ref 0.5–1.9)

## 2021-06-11 LAB — ECHOCARDIOGRAM COMPLETE
Area-P 1/2: 4.89 cm2
Height: 67 in
S' Lateral: 3.5 cm
Weight: 3340.41 oz

## 2021-06-11 LAB — MAGNESIUM: Magnesium: 2.6 mg/dL — ABNORMAL HIGH (ref 1.7–2.4)

## 2021-06-11 LAB — PHOSPHORUS: Phosphorus: 4.7 mg/dL — ABNORMAL HIGH (ref 2.5–4.6)

## 2021-06-11 LAB — TSH: TSH: 1.197 u[IU]/mL (ref 0.350–4.500)

## 2021-06-11 LAB — TROPONIN I (HIGH SENSITIVITY): Troponin I (High Sensitivity): 7 ng/L (ref <18)

## 2021-06-11 MED ORDER — SODIUM CHLORIDE 0.9 % IV BOLUS
500.0000 mL | Freq: Once | INTRAVENOUS | Status: AC
  Administered 2021-06-11: 500 mL via INTRAVENOUS

## 2021-06-11 MED ORDER — SODIUM CHLORIDE 0.9 % IV BOLUS
1000.0000 mL | Freq: Once | INTRAVENOUS | Status: AC
  Administered 2021-06-11: 1000 mL via INTRAVENOUS

## 2021-06-11 MED ORDER — TECHNETIUM TO 99M ALBUMIN AGGREGATED
4.2000 | Freq: Once | INTRAVENOUS | Status: AC | PRN
  Administered 2021-06-11: 4.2 via INTRAVENOUS

## 2021-06-11 MED ORDER — SODIUM CHLORIDE 0.9 % IV SOLN: INTRAVENOUS | Status: DC

## 2021-06-11 NOTE — ED Notes (Signed)
This RN spoke to Dr. Loney Loh who put in fluid orders for this pt. Pt is not symptomatic at this time. Dr. Loney Loh asked this RN to notify the floor to only give tylenol if needed for pain at this time. Additionally Dr. Loney Loh would like a manual to be obtained once the bolus of fluid is done. Primary nurse Judeth Cornfield on 4E notified

## 2021-06-11 NOTE — Progress Notes (Signed)
ANTICOAGULATION CONSULT NOTE  Pharmacy Consult for Heparin Indication:  VTE  Labs: Recent Labs    06/10/21 1812 06/10/21 1812 06/10/21 2222 06/11/21 0157 06/11/21 0158 06/11/21 0800 06/11/21 1417 06/11/21 1510 06/11/21 1728 06/11/21 2221  HGB 14.3  --   --   --  12.1  --   --   --  10.2*  --   HCT 46.6*  --   --   --  40.7  --   --   --  35.0*  --   PLT 481*  --   --   --  359  --   --   --  316  --   HEPARINUNFRC  --    < >  --  0.91*  --  0.64 0.58  --   --  0.23*  CREATININE 2.37*  --   --   --  2.25*  --   --  1.69*  --   --   CKTOTAL  --   --   --  1,531*  --   --   --   --   --   --   TROPONINIHS  --   --  6 7  --   --   --   --   --   --    < > = values in this interval not displayed.    Assessment: 20  yof with Bilateral DVT left leg more symptomatic than the right. Heparin per pharmacy ordered.  Patient not on anticoagulation prior to arrival.  CBC stable. No s/sx of bleeding (pt started menstrual cycle). IV site okay.  HL 0.23 units/ml.  No issues noted with infusion.  Pt is menstruating.  Goal of Therapy:  Heparin level 0.3-0.7 units/ml Monitor platelets by anticoagulation protocol: Yes   Plan:  Increase heparin infusion to 1600 units/hr Check anti-Xa level in 6 hours and daily while on heparin Continue to monitor H&H and platelets  Thanks for allowing pharmacy to be a part of this patient's care.  Talbert Cage, PharmD Clinical Pharmacist 06/11/2021 11:39 PM

## 2021-06-11 NOTE — ED Notes (Addendum)
Pt alert, NAD, calm, interactive, resps e/u, speaking in clear complete sentences, VSS. Denies CP or SOB. Rates calf pain 5/10. Heparin infusing.

## 2021-06-11 NOTE — Consult Note (Addendum)
NEURO HOSPITALIST CONSULT NOTE   Requestig physician: Dr. Caleb Popp  Reason for Consult: Memory loss  HPI:                                                                                                                                          Diane Todd is a 20 y.o. female with recent treatment for thiamine deficiency, hypothyroidism, CKD, recent diagnosis and treatment of NMO, and hypertension.  She prented to the ED today after undergoing a lower extremity duplex yesterday which showed bilateral DVTs and was subsequently admitted to the hospitalist service. She complained of a 3 month history of memory loss so neurology was consulted for further evaluation.  On discussion with her today, she feels that her memory has been poor over the past three months, "since being diagnosed with NMO". She feels that her short term memory is intact. As an example, if she goes into a room to do something, she can remember what she needed to do. If she were to be asked the following day, what she did the day prior, she would not know. She remembers snippets over the past three months, but she can not put them together to form one coherent history. She has not been taking thiamine supplementation since discharge from [facility]. She denies any new symptoms since discharge from [facility], with the exception of the leg pain she had been having. She denies headaches, nausea, vomiting, fatigue, rash, or new focal neurologic changes.   Of note, she was admitted in June 2022 after a 3 month history of recurrent nausea and vomiting for which she had been seen in the ED on multiple occasions. At last admission, she had initially presented to [facility] where brain imaging had shown mulitple pathologies so she was transferred to [facility] for ongoing evaluation. Complaints at that time included vision loss, incoordination, paresthesias and weakness. Brain MRI findings had raised the concern for NMO during  the June admission. Further workup, including MRI of the entire spine did not reveal any demyelinating lesions. She additionally underwent an MRI of the orbits which showed strikingly symmetric abnormal enhancement along the periaquaductal gray matter and medial thalami bilaterally. Differential diagnosis at that time was thiamine deficiency vs demyelinating process.  Thiamine level was tested and was found to be low so she received treatment in the hospital.  AQP4-IgG antibody and MOG-IgG antibody were negative.  She received a 5 day course of high dose steroids and thiamine without significant improvement.  She then underwent a 5 day course of PLEX therapy after which time she showed some improvement.  Opathalmology was also consulted that admission and noted papillary retinal edema concerning for elevated ICP but recommended no further intervention; of note, visual loss and optic nerve head swelling can occur rarely in acute thiamine deficiency.  Of additional note, chart review reveals 7 ED visits for nausea, vomitting, or dehydration  from April-June of this year  Past Medical History:  Diagnosis Date   Hypertension    Hypothyroidism     Past Surgical History:  Procedure Laterality Date   IR FLUORO GUIDE CV LINE RIGHT  05/08/2021   IR US GUIDE VASC ACCESS RIGHT  05/08/2021    Family History  Problem Relation Age of Onset   Hypertension Mother    Diabetes Mellitus II Other         Social History:  reports that she has been smoking e-cigarettes. She has never used smokeless tobacco. She reports previous alcohol use. She reports previous drug use.  No Known Allergies  MEDICATIONS:                                                                                                                     No current facility-administered medications on file prior to encounter.   Current Outpatient Medications on File Prior to Encounter  Medication Sig Dispense Refill   levothyroxine  (SYNTHROID) 100 MCG tablet Take 1 tablet (100 mcg total) by mouth daily. 30 tablet 0   melatonin 3 MG TABS tablet Take 1 tablet (3 mg total) by mouth at bedtime. 30 tablet 0   propranolol (INDERAL) 10 MG tablet Take 1 tablet (10 mg total) by mouth daily. 30 tablet 0   traMADol (ULTRAM) 50 MG tablet Take 1 tablet (50 mg total) by mouth every 6 (six) hours as needed for severe pain. 30 tablet 0   lidocaine (LIDODERM) 5 % Place 2 patches onto the skin daily. Remove & Discard patch within 12 hours or as directed by MD (Patient not taking: Reported on 06/10/2021) 30 patch 0   Multiple Vitamin (MULTIVITAMIN WITH MINERALS) TABS tablet Take 1 tablet by mouth daily. (Patient not taking: No sig reported)        ROS:                                                                                                                                       Negative unless stated in the HPI   Blood pressure (!) 86/48, pulse 74, temperature 98.6 F (37 C), temperature source Oral, resp. rate 19, height  (1.702 m), weight 94.7 kg, SpO2 100 %.   General Examination:  Physical Exam  General: well appearing female in no acute distress HENT: MMM, head atraumatic Eyes: no scleral icterus or conjunctival injection Cardiac: RRR, lower extremity edema (L>R) Pulm: breathing comfortably on room air GI: not distended, soft Skin: no rash on limited exam MSK: pain with palpation of the LLE  Neurological Examination Mental Status: Alert, oriented, thought content appropriate.  Speech fluent without evidence of aphasia.  Able to follow 3 step commands without difficulty. With testing of memory, she remembers 2/3 items after a delay.  Cranial Nerves: II: PERRL III,IV, VI: ptosis not present, extra-ocular motions intact bilaterally. No nystagmus.  V,VII: smile symmetric, facial light touch sensation normal  bilaterally VIII: hearing normal bilaterally IX,X: uvula rises symmetrically XI: bilateral shoulder shrug XII: midline tongue extension Motor: Right : Upper extremity   5/5    Left:     Upper extremity   5/5  Lower extremity   5/5     Lower extremity   5/5 In the context of the above, slightly noticeable weakness in the RUE compared to the left Tone and bulk:normal tone throughout; no atrophy noted No tremors Sensory: sensation intact throughout Deep Tendon Reflexes: 2+ and symmetric throughout Gait: deferred   Lab Results: Basic Metabolic Panel: Recent Labs  Lab 06/05/21 0536 06/10/21 1812 06/11/21 0158  NA 140 134* 137  K 3.8 4.8 4.6  CL 105 100 104  CO2 25 21* 22  GLUCOSE 92 129* 109*  BUN 8 29* 29*  CREATININE 1.22* 2.37* 2.25*  CALCIUM 9.1 9.5 8.8*  MG  --   --  2.6*  PHOS  --   --  4.7*    CBC: Recent Labs  Lab 06/10/21 1812 06/11/21 0158 06/11/21 1728  WBC 14.7* 13.5* 8.9  NEUTROABS  --  10.1*  --   HGB 14.3 12.1 10.2*  HCT 46.6* 40.7 35.0*  MCV 92.5 93.6 97.0  PLT 481* 359 316    Cardiac Enzymes: Recent Labs  Lab 06/11/21 0157  CKTOTAL 1,531*    Lipid Panel: No results for input(s): CHOL, TRIG, HDL, CHOLHDL, VLDL, LDLCALC in the last 168 hours.  Imaging: NM Pulmonary Perfusion  Result Date: 06/11/2021 CLINICAL DATA:  PE suspected, high prob. DVT. Shortness of breath. Chest pain. EXAM: NUCLEAR MEDICINE PERFUSION LUNG SCAN TECHNIQUE: Perfusion images were obtained in multiple projections after intravenous injection of radiopharmaceutical. Ventilation scans intentionally deferred if perfusion scan and chest x-ray adequate for interpretation during COVID 19 epidemic. RADIOPHARMACEUTICALS:  4.2 mCi Tc-92m MAA IV COMPARISON:  Chest x-ray 06/11/2021 FINDINGS: Homogeneous distribution of radiotracer throughout both lung fields. No perfusion defects. IMPRESSION: Normal nuclear medicine perfusion lung scan. No evidence of pulmonary embolism. Electronically  Signed   By: Duanne Guess D.O.   On: 06/11/2021 11:57   US Abdomen Complete  Result Date: 06/11/2021 CLINICAL DATA:  Left leg DVT, rule out IVC thrombosis EXAM: ABDOMEN ULTRASOUND COMPLETE COMPARISON:  Renal ultrasound 05/04/2021, CT 03/14/2021 FINDINGS: Gallbladder: No gallstones or wall thickening visualized. No sonographic Murphy sign noted by sonographer. Common bile duct: Diameter: 3.1 mm, nondilated Liver: No focal lesion identified. Within normal limits in parenchymal echogenicity. Portal vein is patent on color Doppler imaging with normal direction of blood flow towards the liver. IVC: Portions of the IVC are suboptimally visualized due to overlying bowel gas. Visible segments are free of discernible thrombus. Pancreas: Not visualized due to extensive bowel gas. Spleen: Size and appearance within normal limits. Right Kidney: Length: 9.3 cm. Echogenicity is within normal limits. No concerning renal  mass. Few small echogenic foci with posterior shadowing could reflect nonobstructing uroliths. No hydronephrosis. Left Kidney: Length: 7.9 cm. Echogenicity is within normal limits. No concerning renal mass, shadowing calculus or hydronephrosis. Abdominal aorta: No aneurysm visualized in the visible segments of abdominal aorta. Other findings: None. IMPRESSION: Technically challenging exam due to patient body habitus and extensive bowel gas limiting visualization of portions of the abdomen including portions of the IVC. The visible segments of the inferior vena cava appear widely patent however. Few echogenic foci with posterior shadowing in the right kidney, could reflect nonobstructing urolithiasis. No hydronephrosis. Otherwise unremarkable abdominal ultrasound. Electronically Signed   By: Kreg Shropshire M.D.   On: 06/11/2021 00:49   DG CHEST PORT 1 VIEW  Result Date: 06/11/2021 CLINICAL DATA:  Tachycardia, DVT EXAM: PORTABLE CHEST 1 VIEW COMPARISON:  None. FINDINGS: No consolidation, features of edema,  pneumothorax, or effusion. Pulmonary vascularity is normally distributed. The cardiomediastinal contours are unremarkable. No acute osseous or soft tissue abnormality. IMPRESSION: No acute cardiopulmonary abnormality. Electronically Signed   By: Kreg Shropshire M.D.   On: 06/11/2021 00:49   ECHOCARDIOGRAM COMPLETE  Result Date: 06/11/2021    ECHOCARDIOGRAM REPORT   Patient Name:   SAI MOURA Date of Exam: 06/11/2021 Medical Rec #:  [id number]           Height:       67.0 in Accession #:    [id number]          Weight:       208.8 lb Date of Birth:  06/17/2001           BSA:          2.059 m Patient Age:    20 years            BP:           94/53 mmHg Patient Gender: F                   HR:           93 bpm. Exam Location:  Inpatient Procedure: 2D Echo, Cardiac Doppler and Color Doppler Indications:    Dyspnea R06.00  History:        Patient has no prior history of Echocardiogram examinations.                 Risk Factors:Hypertension.  Sonographer:    Eulah Pont RDCS Referring Phys: Consepcion Hearing ANASTASSIA DOUTOVA IMPRESSIONS  1. Left ventricular ejection fraction, by estimation, is 50 to 55%. The left ventricle has low normal function. The left ventricle demonstrates regional wall motion abnormalities (see scoring diagram/findings for description). Left ventricular diastolic  parameters were normal.  2. Right ventricular systolic function is normal. The right ventricular size is normal.  3. The mitral valve is normal in structure. No evidence of mitral valve regurgitation. No evidence of mitral stenosis.  4. The aortic valve is tricuspid. Aortic valve regurgitation is not visualized. No aortic stenosis is present.  5. The inferior vena cava is normal in size with greater than 50% respiratory variability, suggesting right atrial pressure of 3 mmHg. FINDINGS  Left Ventricle: Left ventricular ejection fraction, by estimation, is 50 to 55%. The left ventricle has low normal function. The left ventricle demonstrates  regional wall motion abnormalities. The left ventricular internal cavity size was normal in size. There is no left ventricular hypertrophy. Left ventricular diastolic parameters were normal. Normal left ventricular filling pressure. Right Ventricle: The right ventricular size is normal. No  increase in right ventricular wall thickness. Right ventricular systolic function is normal. Left Atrium: Left atrial size was normal in size. Right Atrium: Right atrial size was normal in size. Pericardium: There is no evidence of pericardial effusion. Mitral Valve: The mitral valve is normal in structure. No evidence of mitral valve regurgitation. No evidence of mitral valve stenosis. Tricuspid Valve: The tricuspid valve is normal in structure. Tricuspid valve regurgitation is trivial. No evidence of tricuspid stenosis. Aortic Valve: The aortic valve is tricuspid. Aortic valve regurgitation is not visualized. No aortic stenosis is present. Pulmonic Valve: The pulmonic valve was normal in structure. Pulmonic valve regurgitation is not visualized. No evidence of pulmonic stenosis. Aorta: The aortic root is normal in size and structure. Venous: The inferior vena cava is normal in size with greater than 50% respiratory variability, suggesting right atrial pressure of 3 mmHg. IAS/Shunts: No atrial level shunt detected by color flow Doppler.  LEFT VENTRICLE PLAX 2D LVIDd:         4.80 cm  Diastology LVIDs:         3.50 cm  LV e' medial:    11.90 cm/s LV PW:         0.90 cm  LV E/e' medial:  4.9 LV IVS:        0.70 cm  LV e' lateral:   12.20 cm/s LVOT diam:     1.90 cm  LV E/e' lateral: 4.7 LV SV:         46 LV SV Index:   23 LVOT Area:     2.84 cm  RIGHT VENTRICLE RV S prime:     11.30 cm/s TAPSE (M-mode): 1.8 cm LEFT ATRIUM             Index       RIGHT ATRIUM          Index LA diam:        3.40 cm 1.65 cm/m  RA Area:     8.89 cm LA Vol (A2C):   25.6 ml 12.43 ml/m RA Volume:   14.80 ml 7.19 ml/m LA Vol (A4C):   21.0 ml 10.20  ml/m LA Biplane Vol: 25.5 ml 12.38 ml/m  AORTIC VALVE LVOT Vmax:   109.00 cm/s LVOT Vmean:  69.800 cm/s LVOT VTI:    0.164 m  AORTA Ao Root diam: 2.70 cm Ao Asc diam:  2.80 cm MITRAL VALVE               TRICUSPID VALVE MV Area (PHT): 4.89 cm    TR Peak grad:   10.6 mmHg MV Decel Time: 155 msec    TR Vmax:        163.00 cm/s MV E velocity: 57.90 cm/s MV A velocity: 53.30 cm/s  SHUNTS MV E/A ratio:  1.09        Systemic VTI:  0.16 m                            Systemic Diam: 1.90 cm Chilton Si MD Electronically signed by Chilton Si MD Signature Date/Time: 06/11/2021/12:27:43 PM    Final    VAS [place] LOWER EXTREMITY VENOUS (DVT)  Result Date: 06/10/2021  Lower Venous DVT Study Patient Name:  MANAAL MANDALA  Date of Exam:   06/10/2021 Medical Rec #: [id number]            Accession #:    [id number] Date of Birth: 2001/02/06  Patient Gender: F Patient Age:   20Y Exam Location:  [facility] Procedure:      VAS [place] LOWER EXTREMITY VENOUS (DVT) Referring Phys: [id number] Marianna Fuss THOMAS --------------------------------------------------------------------------------  Indications: DVT, Pain, and Swelling.  Risk Factors: Recently discharged from hospital on 01/06/21. Performing Technologist: Thereasa Parkin RVT  Examination Guidelines: A complete evaluation includes B-mode imaging, spectral Doppler, color Doppler, and power Doppler as needed of all accessible portions of each vessel. Bilateral testing is considered an integral part of a complete examination. Limited examinations for reoccurring indications may be performed as noted. The reflux portion of the exam is performed with the patient in reverse Trendelenburg.  +---------+---------------+---------+-----------+----------+--------------+ LEFT     CompressibilityPhasicitySpontaneityPropertiesThrombus Aging +---------+---------------+---------+-----------+----------+--------------+ CFV      None           No       No                                   +---------+---------------+---------+-----------+----------+--------------+ SFJ      None                    No                                  +---------+---------------+---------+-----------+----------+--------------+ FV Prox  None           No       No                                  +---------+---------------+---------+-----------+----------+--------------+ FV Mid   None           No       No                                  +---------+---------------+---------+-----------+----------+--------------+ FV DistalNone           No       No                                  +---------+---------------+---------+-----------+----------+--------------+ PFV      Partial                 Yes                                 +---------+---------------+---------+-----------+----------+--------------+ POP      None           No       No                                  +---------+---------------+---------+-----------+----------+--------------+ PTV      Partial        No       Yes                                 +---------+---------------+---------+-----------+----------+--------------+ PERO     Partial        No  Yes                                 +---------+---------------+---------+-----------+----------+--------------+ Gastroc  None                    No                                  +---------+---------------+---------+-----------+----------+--------------+ GSV      Full           Yes      Yes                                 +---------+---------------+---------+-----------+----------+--------------+ SSV      Full                    Yes                                 +---------+---------------+---------+-----------+----------+--------------+ Occlusive thrombus in the left EIV and CIV. Flow seen in the IVC but cannot rule out partial thrombus as it was not well visualized. The right CIV and EIV appear widely  patent.  Findings reported to Dr. Carlis Abbottaulkar at 3:45 pm. [facility]  Summary: RIGHT: - Findings consistent with acute deep vein thrombosis involving the right common femoral vein, SF junction, right femoral vein, right proximal profunda vein, right popliteal vein, right posterior tibial veins, right peroneal veins, and right gastrocnemius veins. Thrombus in the left external iliac vein and common iliac vein. IVC not well visualized. Cannot rule out partial IVC thrombus.   *See table(s) above for measurements and observations. Electronically signed by Coral ElseVance Brabham MD on 06/10/2021 at 6:48:55 PM.    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 She complained of a 3 month history of memory loss, so neurology was consulted for ongoing evaluation. - In the setting of these memory changes, 3 month history of persistent N/V and findings noted on her orbital and brain MRI from 05/05/21, thiamine deficiency rather than NMO may be the primary driving factor.  - The MRI findings can be seen in both severe thiamine deficiency and NMO however given their strikingly symmetric nature as well as lack of cord lesions, thiamine deficiency should be favored. - She received high dose thiamine repletion last admission however has not continued with supplementation since discharge. Question mild Wernicke/Korsakoff encephalopathy regarding her memory complaints.  - Based on literature review of the AQP4-IgG and MOG-IgG antibodies, sensitivities are above 90% for detecting NMO, although can not rule out NMO on their own.  - TSH is normal.  - We are not sure what is causing the RUE weakness noted on exam today. Brain MRI will help  evaluate this; however, would consider her fairly low risk for an ischemic stroke based on her age. A complete spine MRI was performed last admission and was negative for lesions which is reassuring. No oligoclonal bands noted from CSF last  admission. Labs do suggest that she is dry, but no electrolyte abnormalities to account for weakness. Will need to re-evaluate in the morning.  Recommendations: -Recheck thiamine level -Repeat brain MRI -Restart PO thiamine supplementation. She has been educated on the need to continue with this on a daily basis indefinitely -Also educated regarding foods with high thiamine content as well as foods such as processed rice which have low thiamine content. Of note, she states that prior to onset of her symptoms in April, her diet had a high rice content and also was not well-balanced.  -Neurology will continue to follow  Addendum: - MRI brain: Marked interval improvement in previously seen signal changes involving the medial thalami, periaqueductal gray matter, and vestibular nuclei, with relatively subtle residual FLAIR signal abnormality now seen. Associated susceptibility artifact at the medial thalami is new, and likely related to prior inflammation and/or treatment. Also seen is mamillary body susceptibility artifact consistent with symmetric hemosiderin deposition and most likely representing interval development of necrotic changes. These findings are suggestive of chronic imaging sequelae of acute Wernicke's encephalopathy and would be atypical for NMO. Images personally reviewed.    Elige Radon, MD Internal Medicine Resident PGY-3 [facility] Pager: [phone] 06/11/2021 7:36 PM     I have seen and examined the patient. I have formulated the assessment and recommendations. 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 She complained of a 3 month history of memory loss, so neurology was consulted for ongoing evaluation. Exam findings as above. MRI brain shows findings most consistent with chronic imaging sequelae of acute thiamine deficiency. Recommendations as above.   Electronically signed: Dr. Caryl Pina

## 2021-06-11 NOTE — Progress Notes (Signed)
Pt up to 4E11 from ED. VSS. 7/10 pain in L leg, no PRNs requested at this time. Elevated leg on pillow. CHG bath done, tele placed and verified x2. Pt and mother updated on plan of care. Will continue to monitor.  Margarito Liner, RN

## 2021-06-11 NOTE — ED Notes (Signed)
MD to be paged by secretary for pt BP

## 2021-06-11 NOTE — H&P (View-Only) (Signed)
Referring Physician: [facility]  Patient name: Diane Todd MRN: [id number] DOB: 2001-02-19 Sex: female  REASON FOR CONSULT: Extensive DVT left greater than right leg  HPI: Diane Todd is a 20 y.o. female, with recent lengthy hospitalization for neuromyelitis optica.  The patient was fairly sedentary and laying in bed mostly after discharge from the hospital.  She noted increased leg swelling approximately 4 days ago on the left side.  To relieve the pain she placed a heating pad on the back of her leg.  She then sustained a burn in this area.  She was started on heparin last evening.  She states she feels like the left leg is really unchanged.  She has pain in the burn site but does not really complain of pain in the foot or leg itself other than the uncomfortableness of being swollen.  Despite the fact that there is DVT found on the right leg she really has no symptoms in the right side.  She has no prior history of DVT.  She has no family history of DVT.  Other medical problems include hypertension hypothyroidism both of which are controlled.  Past Medical History:  Diagnosis Date   Hypertension    Hypothyroidism    Past Surgical History:  Procedure Laterality Date   IR FLUORO GUIDE CV LINE RIGHT  05/08/2021   IR US GUIDE VASC ACCESS RIGHT  05/08/2021    Family History  Problem Relation Age of Onset   Hypertension Mother    Diabetes Mellitus II Other     SOCIAL HISTORY: Social History   Socioeconomic History   Marital status: Single    Spouse name: Not on file   Number of children: Not on file   Years of education: Not on file   Highest education level: Not on file  Occupational History   Not on file  Tobacco Use   Smoking status: Some Days    Types: E-cigarettes   Smokeless tobacco: Never  Vaping Use   Vaping Use: Some days   Substances: Nicotine, Flavoring  Substance and Sexual Activity   Alcohol use: Not Currently   Drug use: Not Currently    Sexual activity: Not Currently  Other Topics Concern   Not on file  Social History Narrative   Not on file   Social Determinants of Health   Financial Resource Strain: Not on file  Food Insecurity: Not on file  Transportation Needs: Not on file  Physical Activity: Not on file  Stress: Not on file  Social Connections: Not on file  Intimate Partner Violence: Not on file    No Known Allergies  Current Facility-Administered Medications  Medication Dose Route Frequency Provider Last Rate Last Admin   0.9 %  sodium chloride infusion   Intravenous Continuous Doutova, Anastassia, MD 100 mL/hr at 06/11/21 1047 New Bag at 06/11/21 1047   acetaminophen (TYLENOL) tablet 650 mg  650 mg Oral Q6H PRN Therisa Doyne, MD   650 mg at 06/11/21 1044   Or   acetaminophen (TYLENOL) suppository 650 mg  650 mg Rectal Q6H PRN Doutova, Anastassia, MD       bacitracin ointment 1 application  1 application Topical BID Roxy Horseman, PA-C   1 application at 06/11/21 0039   heparin ADULT infusion 100 units/mL (25000 units/238mL)  1,400 Units/hr Intravenous Continuous Doutova, Anastassia, MD 14 mL/hr at 06/11/21 0945 1,400 Units/hr at 06/11/21 0945   HYDROcodone-acetaminophen (NORCO/VICODIN) 5-325 MG per tablet 1-2 tablet  1-2 tablet Oral Q4H  PRN Therisa Doyne, MD   1 tablet at 06/11/21 0006   levothyroxine (SYNTHROID) tablet 100 mcg  100 mcg Oral Daily Therisa Doyne, MD   100 mcg at 06/11/21 0533   propranolol (INDERAL) tablet 10 mg  10 mg Oral Daily Doutova, Jonny Ruiz, MD   10 mg at 06/11/21 1044   traMADol (ULTRAM) tablet 50 mg  50 mg Oral Q6H PRN Therisa Doyne, MD       Current Outpatient Medications  Medication Sig Dispense Refill   levothyroxine (SYNTHROID) 100 MCG tablet Take 1 tablet (100 mcg total) by mouth daily. 30 tablet 0   melatonin 3 MG TABS tablet Take 1 tablet (3 mg total) by mouth at bedtime. 30 tablet 0   propranolol (INDERAL) 10 MG tablet Take 1 tablet (10 mg  total) by mouth daily. 30 tablet 0   traMADol (ULTRAM) 50 MG tablet Take 1 tablet (50 mg total) by mouth every 6 (six) hours as needed for severe pain. 30 tablet 0   lidocaine (LIDODERM) 5 % Place 2 patches onto the skin daily. Remove & Discard patch within 12 hours or as directed by MD (Patient not taking: Reported on 06/10/2021) 30 patch 0   Multiple Vitamin (MULTIVITAMIN WITH MINERALS) TABS tablet Take 1 tablet by mouth daily. (Patient not taking: No sig reported)      ROS:   General:  No weight loss, Fever, chills  HEENT: No recent headaches, no nasal bleeding, no visual changes, no sore throat  Neurologic: No dizziness, blackouts, seizures. No recent symptoms of stroke or mini- stroke. No recent episodes of slurred speech, or temporary blindness.  Cardiac: No recent episodes of chest pain/pressure, no shortness of breath at rest.  No shortness of breath with exertion.  Denies history of atrial fibrillation or irregular heartbeat  Vascular: No history of rest pain in feet.  No history of claudication.  No history of non-healing ulcer, No history of DVT   Pulmonary: No home oxygen, no productive cough, no hemoptysis,  No asthma or wheezing  Musculoskeletal:  [ ]  Arthritis, [ ]  Low back pain,  [ ]  Joint pain  Hematologic:No history of hypercoagulable state.  No history of easy bleeding.  No history of anemia  Gastrointestinal: No hematochezia or melena,  No gastroesophageal reflux, no trouble swallowing  Urinary: [ ]  chronic Kidney disease, [ ]  on HD - [ ]  MWF or [ ]  TTHS, [ ]  Burning with urination, [ ]  Frequent urination, [ ]  Difficulty urinating;   Skin: No rashes  Psychological: No history of anxiety,  No history of depression   Physical Examination  Vitals:   06/11/21 0600 06/11/21 0700 06/11/21 0900 06/11/21 1045  BP: 100/68 (!) 94/53 95/64 110/79  Pulse: 89 98 97 (!) 139  Resp: 18 19 (!) 21 18  Temp:      TempSrc:      SpO2: 100% 96% 99% 100%  Weight:      Height:         Body mass index is 32.7 kg/m.  General:  Alert and oriented, no acute distress HEENT: Normal Neck: No JVD Cardiac: Regular Rate and Rhythm Abdomen: Soft, non-tender, non-distended Skin: No rash, erythema with some blistering left posterior calf covering surface area about 10 x 5 cm superficial Extremity Pulses:  2+ radial, brachial, femoral, dorsalis pedis, posterior tibial pulses bilaterally Musculoskeletal: No significant edema right leg.  Left leg is edematous approximately 30% larger than the left extending from the thigh all the way to the  foot on the left side. Neurologic: Upper and lower extremity motor 5/5 and symmetric  DATA: Patient had a lower extremity duplex scan which showed extensive DVT in the left leg including the external iliac common femoral vein all the way down to the foot.  She also had some areas of DVT in the right leg but no iliac involvement on the right side.  ASSESSMENT: Bilateral DVT left leg more symptomatic than the right.  Patient currently has some renal dysfunction and continues to be tachycardic.  Standard of care treatment for DVT remains IV heparin which she is currently receiving.  I discussed with her the possibility of mechanical thrombectomy of her left leg for improvement of symptoms and hopefully prevention of postphlebitic syndrome down the road.  We also discussed that this is a fairly elective procedure and she does certainly not have a limb threatening problem at this time.  The burn on the back of her leg is secondary to the heating pad and not from her venous obstruction.   PLAN: Continue IV heparin.  We will follow the patient as a consult.  We will consider mechanical thrombectomy later in the week if her renal function improves and her overall clinical condition is more stable.   Fabienne Bruns, MD [facility] Office: [phone]

## 2021-06-11 NOTE — Progress Notes (Signed)
Relevant imaging studies reviewed.  Pt with bilateral DVT.  Will evaluate pt later this morning.  Full consult pending.  She does not need to be NPO from my standpoint.    Continue heparin drip.  Fabienne Bruns, MD [facility] [place] Office: [phone]

## 2021-06-11 NOTE — ED Notes (Signed)
Pt started her period, placed on pure wick, heparin infusion continues.

## 2021-06-11 NOTE — Consult Note (Signed)
Referring Physician: [facility]  Patient name: Diane Todd MRN: [id number] DOB: 2001-02-19 Sex: female  REASON FOR CONSULT: Extensive DVT left greater than right leg  HPI: Diane Todd is a 20 y.o. female, with recent lengthy hospitalization for neuromyelitis optica.  The patient was fairly sedentary and laying in bed mostly after discharge from the hospital.  She noted increased leg swelling approximately 4 days ago on the left side.  To relieve the pain she placed a heating pad on the back of her leg.  She then sustained a burn in this area.  She was started on heparin last evening.  She states she feels like the left leg is really unchanged.  She has pain in the burn site but does not really complain of pain in the foot or leg itself other than the uncomfortableness of being swollen.  Despite the fact that there is DVT found on the right leg she really has no symptoms in the right side.  She has no prior history of DVT.  She has no family history of DVT.  Other medical problems include hypertension hypothyroidism both of which are controlled.  Past Medical History:  Diagnosis Date   Hypertension    Hypothyroidism    Past Surgical History:  Procedure Laterality Date   IR FLUORO GUIDE CV LINE RIGHT  05/08/2021   IR US GUIDE VASC ACCESS RIGHT  05/08/2021    Family History  Problem Relation Age of Onset   Hypertension Mother    Diabetes Mellitus II Other     SOCIAL HISTORY: Social History   Socioeconomic History   Marital status: Single    Spouse name: Not on file   Number of children: Not on file   Years of education: Not on file   Highest education level: Not on file  Occupational History   Not on file  Tobacco Use   Smoking status: Some Days    Types: E-cigarettes   Smokeless tobacco: Never  Vaping Use   Vaping Use: Some days   Substances: Nicotine, Flavoring  Substance and Sexual Activity   Alcohol use: Not Currently   Drug use: Not Currently    Sexual activity: Not Currently  Other Topics Concern   Not on file  Social History Narrative   Not on file   Social Determinants of Health   Financial Resource Strain: Not on file  Food Insecurity: Not on file  Transportation Needs: Not on file  Physical Activity: Not on file  Stress: Not on file  Social Connections: Not on file  Intimate Partner Violence: Not on file    No Known Allergies  Current Facility-Administered Medications  Medication Dose Route Frequency Provider Last Rate Last Admin   0.9 %  sodium chloride infusion   Intravenous Continuous Doutova, Anastassia, MD 100 mL/hr at 06/11/21 1047 New Bag at 06/11/21 1047   acetaminophen (TYLENOL) tablet 650 mg  650 mg Oral Q6H PRN Therisa Doyne, MD   650 mg at 06/11/21 1044   Or   acetaminophen (TYLENOL) suppository 650 mg  650 mg Rectal Q6H PRN Doutova, Anastassia, MD       bacitracin ointment 1 application  1 application Topical BID Roxy Horseman, PA-C   1 application at 06/11/21 0039   heparin ADULT infusion 100 units/mL (25000 units/238mL)  1,400 Units/hr Intravenous Continuous Doutova, Anastassia, MD 14 mL/hr at 06/11/21 0945 1,400 Units/hr at 06/11/21 0945   HYDROcodone-acetaminophen (NORCO/VICODIN) 5-325 MG per tablet 1-2 tablet  1-2 tablet Oral Q4H  PRN Therisa Doyne, MD   1 tablet at 06/11/21 0006   levothyroxine (SYNTHROID) tablet 100 mcg  100 mcg Oral Daily Therisa Doyne, MD   100 mcg at 06/11/21 0533   propranolol (INDERAL) tablet 10 mg  10 mg Oral Daily Doutova, Jonny Ruiz, MD   10 mg at 06/11/21 1044   traMADol (ULTRAM) tablet 50 mg  50 mg Oral Q6H PRN Therisa Doyne, MD       Current Outpatient Medications  Medication Sig Dispense Refill   levothyroxine (SYNTHROID) 100 MCG tablet Take 1 tablet (100 mcg total) by mouth daily. 30 tablet 0   melatonin 3 MG TABS tablet Take 1 tablet (3 mg total) by mouth at bedtime. 30 tablet 0   propranolol (INDERAL) 10 MG tablet Take 1 tablet (10 mg  total) by mouth daily. 30 tablet 0   traMADol (ULTRAM) 50 MG tablet Take 1 tablet (50 mg total) by mouth every 6 (six) hours as needed for severe pain. 30 tablet 0   lidocaine (LIDODERM) 5 % Place 2 patches onto the skin daily. Remove & Discard patch within 12 hours or as directed by MD (Patient not taking: Reported on 06/10/2021) 30 patch 0   Multiple Vitamin (MULTIVITAMIN WITH MINERALS) TABS tablet Take 1 tablet by mouth daily. (Patient not taking: No sig reported)      ROS:   General:  No weight loss, Fever, chills  HEENT: No recent headaches, no nasal bleeding, no visual changes, no sore throat  Neurologic: No dizziness, blackouts, seizures. No recent symptoms of stroke or mini- stroke. No recent episodes of slurred speech, or temporary blindness.  Cardiac: No recent episodes of chest pain/pressure, no shortness of breath at rest.  No shortness of breath with exertion.  Denies history of atrial fibrillation or irregular heartbeat  Vascular: No history of rest pain in feet.  No history of claudication.  No history of non-healing ulcer, No history of DVT   Pulmonary: No home oxygen, no productive cough, no hemoptysis,  No asthma or wheezing  Musculoskeletal:  [ ]  Arthritis, [ ]  Low back pain,  [ ]  Joint pain  Hematologic:No history of hypercoagulable state.  No history of easy bleeding.  No history of anemia  Gastrointestinal: No hematochezia or melena,  No gastroesophageal reflux, no trouble swallowing  Urinary: [ ]  chronic Kidney disease, [ ]  on HD - [ ]  MWF or [ ]  TTHS, [ ]  Burning with urination, [ ]  Frequent urination, [ ]  Difficulty urinating;   Skin: No rashes  Psychological: No history of anxiety,  No history of depression   Physical Examination  Vitals:   06/11/21 0600 06/11/21 0700 06/11/21 0900 06/11/21 1045  BP: 100/68 (!) 94/53 95/64 110/79  Pulse: 89 98 97 (!) 139  Resp: 18 19 (!) 21 18  Temp:      TempSrc:      SpO2: 100% 96% 99% 100%  Weight:      Height:         Body mass index is 32.7 kg/m.  General:  Alert and oriented, no acute distress HEENT: Normal Neck: No JVD Cardiac: Regular Rate and Rhythm Abdomen: Soft, non-tender, non-distended Skin: No rash, erythema with some blistering left posterior calf covering surface area about 10 x 5 cm superficial Extremity Pulses:  2+ radial, brachial, femoral, dorsalis pedis, posterior tibial pulses bilaterally Musculoskeletal: No significant edema right leg.  Left leg is edematous approximately 30% larger than the left extending from the thigh all the way to the  foot on the left side. Neurologic: Upper and lower extremity motor 5/5 and symmetric  DATA: Patient had a lower extremity duplex scan which showed extensive DVT in the left leg including the external iliac common femoral vein all the way down to the foot.  She also had some areas of DVT in the right leg but no iliac involvement on the right side.  ASSESSMENT: Bilateral DVT left leg more symptomatic than the right.  Patient currently has some renal dysfunction and continues to be tachycardic.  Standard of care treatment for DVT remains IV heparin which she is currently receiving.  I discussed with her the possibility of mechanical thrombectomy of her left leg for improvement of symptoms and hopefully prevention of postphlebitic syndrome down the road.  We also discussed that this is a fairly elective procedure and she does certainly not have a limb threatening problem at this time.  The burn on the back of her leg is secondary to the heating pad and not from her venous obstruction.   PLAN: Continue IV heparin.  We will follow the patient as a consult.  We will consider mechanical thrombectomy later in the week if her renal function improves and her overall clinical condition is more stable.   Fabienne Bruns, MD [facility] [place] Office: [phone]
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
## 2021-06-11 NOTE — ED Notes (Addendum)
Pt remains in MRI at this time. Will recheck pt vitals when pt returns and attempt to give report when pt returns. Consulting civil engineer notified.

## 2021-06-11 NOTE — ED Notes (Signed)
Pt alert, NAD, calm, interactive, denies questions or needs, VS improved, VSS, to MRI.

## 2021-06-11 NOTE — Progress Notes (Signed)
  Echocardiogram 2D Echocardiogram has been performed.  Diane Todd 06/11/2021, 9:59 AM

## 2021-06-11 NOTE — ED Notes (Signed)
[place] at [facility]. Family at [facility].

## 2021-06-11 NOTE — Progress Notes (Signed)
ANTICOAGULATION CONSULT NOTE - Follow Up Consult  Pharmacy Consult for Heparin Indication:  VTE  No Known Allergies  Patient Measurements: Height: 5\' 7"  (170.2 cm) Weight: 94.7 kg (208 lb 12.4 oz) IBW/kg (Calculated) : 61.6 Heparin Dosing Weight: 82.3 kg  Vital Signs: Temp: 98.6 F (37 C) (07/25 2238) Temp Source: Oral (07/25 2238) BP: 94/53 (07/26 0700) Pulse Rate: 98 (07/26 0700)  Labs: Recent Labs    06/10/21 1812 06/10/21 2222 06/11/21 0157 06/11/21 0158 06/11/21 0800  HGB 14.3  --   --  12.1  --   HCT 46.6*  --   --  40.7  --   PLT 481*  --   --  359  --   HEPARINUNFRC  --   --  0.91*  --  0.64  CREATININE 2.37*  --   --  2.25*  --   CKTOTAL  --   --  1,531*  --   --   TROPONINIHS  --  6 7  --   --      Estimated Creatinine Clearance: 47.1 mL/min (A) (by C-G formula based on SCr of 2.25 mg/dL (H)).   Medical History: Past Medical History:  Diagnosis Date   Hypertension    Hypothyroidism     Medications:  (Not in a hospital admission) Scheduled:   bacitracin  1 application Topical BID   levothyroxine  100 mcg Oral Daily   propranolol  10 mg Oral Daily   Infusions:   sodium chloride 100 mL/hr at 06/11/21 0215   heparin 1,400 Units/hr (06/10/21 2244)    Assessment: 20  yof presenting with complaints of a DVT in her left calf found in outpatient setting. Heparin per pharmacy ordered.  Patient not on anticoagulation prior to arrival. No s/sx of bleeding, IV site okay.  HL this AM was 0.64, therapeutic  Goal of Therapy:  Heparin level 0.3-0.7 units/ml Monitor platelets by anticoagulation protocol: Yes   Plan: Continue heparin gtt at 1400 units/hr and f/u 6h confirmatory HL @ 1400 today  06/12/21, PharmD, [facility] Emergency Medicine Clinical Pharmacist ED RPh Phone: [phone] Main RX: [phone]

## 2021-06-11 NOTE — ED Notes (Signed)
Judeth Cornfield RN who will be receiving pt notified of pt BP and that floor coverage MD has been notified

## 2021-06-11 NOTE — ED Notes (Signed)
Neurology at [facility] MRI transport here for pt.

## 2021-06-11 NOTE — ED Notes (Signed)
Pt placed on purewick. Pt noted to have started menstrual period, pt cleaned up and PureWick placed. RN notified of menstrual cycle start.

## 2021-06-11 NOTE — Progress Notes (Signed)
ANTICOAGULATION CONSULT NOTE - Initial Consult  Pharmacy Consult for Heparin Indication:  VTE  No Known Allergies  Patient Measurements: Height: 5\' 7"  (170.2 cm) Weight: 94.7 kg (208 lb 12.4 oz) IBW/kg (Calculated) : 61.6 Heparin Dosing Weight: 82.3 kg  Vital Signs: BP: 82/52 (07/26 1700) Pulse Rate: 74 (07/26 1700)  Labs: Recent Labs    06/10/21 1812 06/10/21 2222 06/11/21 0157 06/11/21 0158 06/11/21 0800 06/11/21 1417  HGB 14.3  --   --  12.1  --   --   HCT 46.6*  --   --  40.7  --   --   PLT 481*  --   --  359  --   --   HEPARINUNFRC  --   --  0.91*  --  0.64 0.58  CREATININE 2.37*  --   --  2.25*  --   --   CKTOTAL  --   --  1,531*  --   --   --   TROPONINIHS  --  6 7  --   --   --      Estimated Creatinine Clearance: 47.1 mL/min (A) (by C-G formula based on SCr of 2.25 mg/dL (H)).   Medical History: Past Medical History:  Diagnosis Date   Hypertension    Hypothyroidism     Medications:  (Not in a hospital admission) Scheduled:   bacitracin  1 application Topical BID   levothyroxine  100 mcg Oral Daily   propranolol  10 mg Oral Daily   Infusions:   sodium chloride 100 mL/hr at 06/11/21 1047   heparin 1,400 Units/hr (06/11/21 0945)   Assessment: 20  yof with Bilateral DVT left leg more symptomatic than the right. Heparin per pharmacy ordered.  Patient not on anticoagulation prior to arrival.  CBC stable. No s/sx of bleeding (pt started menstrual cycle). IV site okay.  HL 0.58 this afternoon which is therapeutic but has decreased since this morning.  Goal of Therapy:  Heparin level 0.3-0.7 units/ml Monitor platelets by anticoagulation protocol: Yes   Plan:  Continue heparin infusion at 1400 units/hr - will get another 6 hour level to confirm not trending down before transitioning to daily heparin levels Check anti-Xa level in 6 hours and daily while on heparin Continue to monitor H&H and platelets  06/13/21, PharmD, BCPS 06/11/2021  5:27 PM ED Clinical Pharmacist -  [phone]
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
## 2021-06-11 NOTE — ED Notes (Signed)
Back from nuc med, VQ complete, no changes, pain is the same, VSS, IVF and heparin infusing.

## 2021-06-11 NOTE — ED Notes (Signed)
MRI called for pt, pt not claustrophobic, B1 is a lavender send out.

## 2021-06-12 ENCOUNTER — Encounter (HOSPITAL_COMMUNITY): Payer: Self-pay | Admitting: Family Medicine

## 2021-06-12 DIAGNOSIS — I82403 Acute embolism and thrombosis of unspecified deep veins of lower extremity, bilateral: Secondary | ICD-10-CM

## 2021-06-12 DIAGNOSIS — I82423 Acute embolism and thrombosis of iliac vein, bilateral: Secondary | ICD-10-CM

## 2021-06-12 LAB — CBC
HCT: 32.6 % — ABNORMAL LOW (ref 36.0–46.0)
Hemoglobin: 9.8 g/dL — ABNORMAL LOW (ref 12.0–15.0)
MCH: 28.4 pg (ref 26.0–34.0)
MCHC: 30.1 g/dL (ref 30.0–36.0)
MCV: 94.5 fL (ref 80.0–100.0)
Platelets: 304 10*3/uL (ref 150–400)
RBC: 3.45 MIL/uL — ABNORMAL LOW (ref 3.87–5.11)
RDW: 18.4 % — ABNORMAL HIGH (ref 11.5–15.5)
WBC: 8.4 10*3/uL (ref 4.0–10.5)
nRBC: 0 % (ref 0.0–0.2)

## 2021-06-12 LAB — HEPARIN LEVEL (UNFRACTIONATED): Heparin Unfractionated: 0.38 IU/mL (ref 0.30–0.70)

## 2021-06-12 LAB — BASIC METABOLIC PANEL
Anion gap: 9 (ref 5–15)
BUN: 18 mg/dL (ref 6–20)
CO2: 19 mmol/L — ABNORMAL LOW (ref 22–32)
Calcium: 8.3 mg/dL — ABNORMAL LOW (ref 8.9–10.3)
Chloride: 114 mmol/L — ABNORMAL HIGH (ref 98–111)
Creatinine, Ser: 1.41 mg/dL — ABNORMAL HIGH (ref 0.44–1.00)
GFR, Estimated: 55 mL/min — ABNORMAL LOW (ref >60)
Glucose, Bld: 80 mg/dL (ref 70–99)
Potassium: 4.2 mmol/L (ref 3.5–5.1)
Sodium: 142 mmol/L (ref 135–145)

## 2021-06-12 LAB — ANA W/REFLEX IF POSITIVE: Anti Nuclear Antibody (ANA): NEGATIVE

## 2021-06-12 MED ORDER — THIAMINE HCL 100 MG/ML IJ SOLN
500.0000 mg | Freq: Three times a day (TID) | INTRAVENOUS | Status: AC
  Administered 2021-06-12 – 2021-06-14 (×9): 500 mg via INTRAVENOUS
  Filled 2021-06-12 (×9): qty 5

## 2021-06-12 MED ORDER — THIAMINE HCL 100 MG PO TABS
100.0000 mg | ORAL_TABLET | Freq: Every day | ORAL | Status: DC
  Administered 2021-06-15 – 2021-06-16 (×2): 100 mg via ORAL
  Filled 2021-06-12 (×3): qty 1

## 2021-06-12 MED ORDER — SILVER SULFADIAZINE 1 % EX CREA
TOPICAL_CREAM | Freq: Two times a day (BID) | CUTANEOUS | Status: DC
  Administered 2021-06-15: 1 via TOPICAL
  Filled 2021-06-12: qty 85

## 2021-06-12 MED ORDER — THIAMINE HCL 100 MG PO TABS: 500.0000 mg | ORAL_TABLET | Freq: Every day | ORAL | Status: DC

## 2021-06-12 MED ORDER — SODIUM CHLORIDE 0.45 % IV SOLN: INTRAVENOUS | Status: DC

## 2021-06-12 NOTE — Progress Notes (Signed)
Education provided to patient and her mother regarding thiamine medication. Patient stated she understands she has a thiamine deficiency, and was able to teach back correcting thiamine deficiency may help reduce her symptoms. She stated she could not remember to take the medication when she was at home, and that her mother will help remind her after she is discharged. Her mother was in agreement to this plan.
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
## 2021-06-12 NOTE — Plan of Care (Signed)

## 2021-06-12 NOTE — Consult Note (Signed)
WOC Nurse Consult Note: Reason for Consult: Thermal injury to left posterior LE from heating pad.  Intact and ruptured serum filled blisters within a 7 x 11cm area.  The largest intact blister measures 5cm x 1.5cm. The largest area of ruptured blister is in the most posterior portion of the LE and measures 3cm x 4cm x 0.1cm Wound type: Thermal Pressure Injury POA:N/A Measurement: As noted above. Wound bed: The ruptured blister presents with an 80% red and 20% yellow wound base Drainage (amount, consistency, odor) small amount serous exudate. Periwound: intact Dressing procedure/placement/frequency: I will provide Nursing with guidance for the care of this injury using a topical antimicrobial (silver sulfadiazine) twice daily for 14 days to prevent local infection. Due to her comorbid conditions, the LLE is resting on a pillow and is painful for her to move.   Vascular Surgery is involved in this patient and I welcome their involvement, input and oversight to this POC.Nursing is to notify the MD of change in status of the wounded area including but not limited to an increase in erythema, pain, or change in color or character of exudate.  WOC nursing team will not follow, but will remain available to this patient, the nursing and medical teams.  Please re-consult if needed. Thanks, Ladona Mow, MSN, RN, GNP, [place]  Pager# [phone]

## 2021-06-12 NOTE — Evaluation (Signed)
Physical Therapy Evaluation Patient Details Name: Diane Todd MRN: [id number] DOB: 15-Feb-2001 Today's Date: 06/12/2021   History of Present Illness  20 y.o. female who presents to [facility] ED on 06/10/2021 with LE edema, found to have extensive bilateral DVTs. Plan for possible mechanical thrombectomy with iliac vein stenting on 06/14/2021. PMH of NMO, HTN, hypothyroidism.  Clinical Impression  Pt presents to PT with deficits in strength, power, gait, functional mobility, balance, endurance. Pt is significantly limited by LLE pain at this time, unable to tolerate weightbearing. Pt requires significant physical assistance to perform transfers, and is unsafe to initiate ambulation due to high falls risk. PT is hopeful for patient progression with mobility if pain is better managed. Pt will benefit from aggressive mobilization and exercise to aide in improving mobility. PT recommends HHPT services, with the hope that the pt progresses quickly. If pt does not make considerable progress over the following PT sessions then inpatient placement may be the safest discharge option when medically ready.    Follow Up Recommendations Home health PT;Supervision/Assistance - 24 hour (if no progression by next session will need to consider inpatient placement)    Equipment Recommendations   (TBD)    Recommendations for Other Services       Precautions / Restrictions Precautions Precautions: Fall Precaution Comments: Contact precautions Restrictions Weight Bearing Restrictions: No      Mobility  Bed Mobility Overal bed mobility: Needs Assistance Bed Mobility: Supine to Sit;Sit to Supine     Supine to sit: Min assist Sit to supine: Min assist        Transfers Overall transfer level: Needs assistance Equipment used: Rolling walker (2 wheeled);1 person hand held assist Transfers: Sit to/from Stand Sit to Stand: Max assist         General transfer comment: pt unable to clear buttocks with  walker and PT assist. PT provides knee block and BUE support on 2nd attempt and is able to achieve standing, pt maintaining L foot off floor due to discomfort  Ambulation/Gait                Stairs            Wheelchair Mobility    Modified Rankin (Stroke Patients Only)       Balance Overall balance assessment: Needs assistance Sitting-balance support: No upper extremity supported;Feet supported Sitting balance-Leahy Scale: Good     Standing balance support: Bilateral upper extremity supported Standing balance-Leahy Scale: Zero Standing balance comment: maxA                             Pertinent Vitals/Pain Pain Assessment: 0-10 Pain Score: 7  (7 at rest, increases with activity but not quantified) Pain Location: LLE Pain Descriptors / Indicators: Aching Pain Intervention(s): Premedicated before session    Home Living Family/patient expects to be discharged to:: Private residence Living Arrangements: Parent;Other relatives Available Help at Discharge: Family;Available 24 hours/day Type of Home: House Home Access: Stairs to enter Entrance Stairs-Rails: Right Entrance Stairs-Number of Steps: 5 Home Layout: One level Home Equipment: Wheelchair - manual;Shower seat      Prior Function Level of Independence: Independent         Comments: independent in April 2022, since last admission in June pt was ambulating short household distances with RW and supervision. Pt required assistance for transfers and stair negotiation. Pt also required assistance for ADLs     Hand Dominance  Extremity/Trunk Assessment   Upper Extremity Assessment Upper Extremity Assessment: Generalized weakness    Lower Extremity Assessment Lower Extremity Assessment: Generalized weakness;LLE deficits/detail LLE Deficits / Details: LLE with significant weakness, pt utilizing UEs to assist in bed mobility. 3/5 knee extension, 3/5 DF/PF through limited ROM 2/2  significant LLE edema    Cervical / Trunk Assessment Cervical / Trunk Assessment: Normal  Communication   Communication: No difficulties  Cognition Arousal/Alertness: Awake/alert Behavior During Therapy: WFL for tasks assessed/performed Overall Cognitive Status: Within Functional Limits for tasks assessed                                        General Comments General comments (skin integrity, edema, etc.): pt tachy to 157 with activity. HR up to 120s at rest    Exercises General Exercises - Lower Extremity Ankle Circles/Pumps: AROM;Both;5 reps Gluteal Sets: AROM;Both;5 reps Straight Leg Raises: AROM;Both;5 reps Other Exercises Other Exercises: PT encourages performance of bridges as tolerated for hip extension strength Other Exercises: PT instructs pt in towel stretch to improve ankle dorsiflexion   Assessment/Plan    PT Assessment Patient needs continued PT services  PT Problem List Decreased strength;Decreased activity tolerance;Decreased balance;Decreased range of motion;Decreased mobility;Decreased knowledge of use of DME;Decreased safety awareness;Decreased knowledge of precautions;Pain       PT Treatment Interventions DME instruction;Gait training;Stair training;Functional mobility training;Therapeutic activities;Therapeutic exercise;Balance training;Patient/family education    PT Goals (Current goals can be found in the Care Plan section)  Acute Rehab PT Goals Patient Stated Goal: to reduce pain and improve mobility PT Goal Formulation: With patient Time For Goal Achievement: 06/26/21 Potential to Achieve Goals: Fair    Frequency Min 4X/week   Barriers to discharge        Co-evaluation               AM-PAC PT "6 Clicks" Mobility  Outcome Measure Help needed turning from your back to your side while in a flat bed without using bedrails?: A Little Help needed moving from lying on your back to sitting on the side of a flat bed without  using bedrails?: A Little Help needed moving to and from a bed to a chair (including a wheelchair)?: A Lot Help needed standing up from a chair using your arms (e.g., wheelchair or bedside chair)?: A Lot Help needed to walk in hospital room?: Total Help needed climbing 3-5 steps with a railing? : Total 6 Click Score: 12    End of Session   Activity Tolerance: Patient limited by pain Patient left: in bed;with call bell/phone within reach;with bed alarm set;with family/visitor present Nurse Communication: Mobility status PT Visit Diagnosis: Other abnormalities of gait and mobility (R26.89);Muscle weakness (generalized) (M62.81);Pain Pain - Right/Left: Left Pain - part of body: Leg    Time: 1062-6948 PT Time Calculation (min) (ACUTE ONLY): 25 min   Charges:   PT Evaluation $PT Eval Low Complexity: 1 Low          Arlyss Gandy, PT, DPT Acute Rehabilitation Pager: [phone]   Arlyss Gandy 06/12/2021, 5:25 PM

## 2021-06-12 NOTE — Progress Notes (Signed)
ANTICOAGULATION CONSULT NOTE  Pharmacy Consult for Heparin Indication:  VTE  Labs: Recent Labs    06/10/21 2222 06/11/21 0157 06/11/21 0158 06/11/21 0800 06/11/21 1417 06/11/21 1510 06/11/21 1728 06/11/21 2221 06/12/21 0423 06/12/21 0647  HGB  --   --  12.1  --   --   --  10.2*  --  9.8*  --   HCT  --   --  40.7  --   --   --  35.0*  --  32.6*  --   PLT  --   --  359  --   --   --  316  --  304  --   HEPARINUNFRC  --  0.91*  --    < > 0.58  --   --  0.23*  --  0.38  CREATININE  --   --  2.25*  --   --  1.69*  --   --  1.41*  --   CKTOTAL  --  1,531*  --   --   --   --   --   --   --   --   TROPONINIHS 6 7  --   --   --   --   --   --   --   --    < > = values in this interval not displayed.    Assessment: 20  yof with Bilateral DVT left leg more symptomatic than the right. Heparin per pharmacy ordered. No AC PTA.   Heparin level came back therapeutic at 0.38, on 1600 units/hr. Hgb 9.8, plt 304. No s/sx of bleeding.  Goal of Therapy:  Heparin level 0.3-0.7 units/ml Monitor platelets by anticoagulation protocol: Yes   Plan:  Continue heparin infusion at 1600 units/hr Check anti-Xa level daily while on heparin Continue to monitor H&H and platelets  Thanks for allowing pharmacy to be a part of this patient's care.  Sherron Monday, PharmD, BCCCP Clinical Pharmacist  Phone: [phone] 06/12/2021 8:10 AM  Please check AMION for all [facility] Pharm[facility] phone numbers After 10:00 PM, call Main Pharmacy [phone]

## 2021-06-12 NOTE — Progress Notes (Addendum)
  Progress Note    06/12/2021 7:27 AM * No surgery date entered *  Subjective:  Legs feel about the same   Vitals:   06/12/21 0550 06/12/21 0600  BP:    Pulse: 84 78  Resp: (!) 21 15  Temp:    SpO2: 100% 100%    Physical Exam: General appearance: Awake, alert in no apparent distress Cardiac: Heart rate and rhythm are regular Respirations: Nonlabored Extremities: LLE moderately edematous. Compartments soft. Both feet are warm with intact sensation  Pulse/Doppler exam: Palpable DP pulses bilaterally.    CBC    Component Value Date/Time   WBC 8.4 06/12/2021 0423   RBC 3.45 (L) 06/12/2021 0423   HGB 9.8 (L) 06/12/2021 0423   HCT 32.6 (L) 06/12/2021 0423   PLT 304 06/12/2021 0423   MCV 94.5 06/12/2021 0423   MCH 28.4 06/12/2021 0423   MCHC 30.1 06/12/2021 0423   RDW 18.4 (H) 06/12/2021 0423   LYMPHSABS 1.8 06/11/2021 0158   MONOABS 1.2 (H) 06/11/2021 0158   EOSABS 0.1 06/11/2021 0158   BASOSABS 0.1 06/11/2021 0158    BMET    Component Value Date/Time   NA 142 06/12/2021 0423   K 4.2 06/12/2021 0423   CL 114 (H) 06/12/2021 0423   CO2 19 (L) 06/12/2021 0423   GLUCOSE 80 06/12/2021 0423   BUN 18 06/12/2021 0423   CREATININE 1.41 (H) 06/12/2021 0423   CALCIUM 8.3 (L) 06/12/2021 0423   GFRNONAA 55 (L) 06/12/2021 0423     Intake/Output Summary (Last 24 hours) at 06/12/2021 0727 Last data filed at 06/12/2021 0400 Gross per 24 hour  Intake 3724.82 ml  Output 400 ml  Net 3324.82 ml    HOSPITAL MEDICATIONS Scheduled Meds:  bacitracin  1 application Topical BID   levothyroxine  100 mcg Oral Daily   Continuous Infusions:  sodium chloride 100 mL/hr at 06/11/21 2300   heparin 1,600 Units/hr (06/11/21 2345)   PRN Meds:.acetaminophen **OR** acetaminophen, HYDROcodone-acetaminophen, traMADol  Assessment and Plan: Bilateral DVT left leg more symptomatic than the right. No phlegmasia. Palpable pulses. Afebrile. Heparin continues.   AKI>>serum creatinine  improving  Dr. Darrick Penna will re-assess and advise regarding thrombectomy  -DVT prophylaxis:  heparin infusion   Wendi Maya, PA-C [facility] [phone] 06/12/2021  7:27 AM   Agree with above.  Pt has pain in left leg some from the burn some from the DVT.  Her renal function is improving.  She is still tachycardic.  Continue heparin Continue IV hydration.  D/w pt mechanical thrombectomy possible iliac vein stent on Friday 06/14/21.  Procedure details prone position risk benefits discusse. We will continue heparin during the procedure.  NPO after midnight for Friday procedure Consent  Fabienne Bruns, MD [facility] [place] Office: [phone]

## 2021-06-12 NOTE — Plan of Care (Signed)

## 2021-06-13 ENCOUNTER — Encounter: Payer: Self-pay | Admitting: Neurology

## 2021-06-13 ENCOUNTER — Ambulatory Visit: Payer: Medicaid Other | Admitting: Neurology

## 2021-06-13 DIAGNOSIS — I82220 Acute embolism and thrombosis of inferior vena cava: Secondary | ICD-10-CM | POA: Diagnosis not present

## 2021-06-13 DIAGNOSIS — I82493 Acute embolism and thrombosis of other specified deep vein of lower extremity, bilateral: Secondary | ICD-10-CM | POA: Diagnosis not present

## 2021-06-13 DIAGNOSIS — N179 Acute kidney failure, unspecified: Secondary | ICD-10-CM | POA: Diagnosis not present

## 2021-06-13 DIAGNOSIS — G360 Neuromyelitis optica [Devic]: Secondary | ICD-10-CM | POA: Diagnosis not present

## 2021-06-13 LAB — BASIC METABOLIC PANEL
Anion gap: 3 — ABNORMAL LOW (ref 5–15)
BUN: 12 mg/dL (ref 6–20)
CO2: 23 mmol/L (ref 22–32)
Calcium: 8.6 mg/dL — ABNORMAL LOW (ref 8.9–10.3)
Chloride: 116 mmol/L — ABNORMAL HIGH (ref 98–111)
Creatinine, Ser: 1.39 mg/dL — ABNORMAL HIGH (ref 0.44–1.00)
GFR, Estimated: 56 mL/min — ABNORMAL LOW (ref >60)
Glucose, Bld: 93 mg/dL (ref 70–99)
Potassium: 4.2 mmol/L (ref 3.5–5.1)
Sodium: 142 mmol/L (ref 135–145)

## 2021-06-13 LAB — ANTIPHOSPHOLIPID SYNDROME EVAL, BLD
Anticardiolipin IgA: <9 APL U/mL (ref 0–11)
Anticardiolipin IgG: <9 GPL U/mL (ref 0–14)
Anticardiolipin IgM: 13 MPL U/mL — ABNORMAL HIGH (ref 0–12)
DRVVT: 69.6 s — ABNORMAL HIGH (ref 0.0–47.0)
PTT Lupus Anticoagulant: 62.7 s — ABNORMAL HIGH (ref 0.0–51.9)
Phosphatydalserine, IgA: 1 APS Units (ref 0–19)
Phosphatydalserine, IgG: 9 Units (ref 0–30)
Phosphatydalserine, IgM: 36 Units — ABNORMAL HIGH (ref 0–30)

## 2021-06-13 LAB — HEXAGONAL PHASE PHOSPHOLIPID: Hexagonal Phase Phospholipid: 7 s (ref 0–11)

## 2021-06-13 LAB — DRVVT CONFIRM: dRVVT Confirm: 1.3 ratio — ABNORMAL HIGH (ref 0.8–1.2)

## 2021-06-13 LAB — DRVVT MIX: dRVVT Mix: 52.8 s — ABNORMAL HIGH (ref 0.0–40.4)

## 2021-06-13 LAB — HEPARIN LEVEL (UNFRACTIONATED): Heparin Unfractionated: 0.61 IU/mL (ref 0.30–0.70)

## 2021-06-13 LAB — PTT-LA MIX: PTT-LA Mix: 58 s — ABNORMAL HIGH (ref 0.0–48.9)

## 2021-06-13 MED ORDER — POLYETHYLENE GLYCOL 3350 17 G PO PACK
17.0000 g | PACK | Freq: Every day | ORAL | Status: DC | PRN
  Administered 2021-06-13: 17 g via ORAL
  Filled 2021-06-13: qty 1

## 2021-06-13 MED ORDER — LORAZEPAM 0.5 MG PO TABS: 0.5000 mg | ORAL_TABLET | Freq: Once | ORAL | Status: DC

## 2021-06-13 MED ORDER — SENNA 8.6 MG PO TABS
2.0000 | ORAL_TABLET | Freq: Every day | ORAL | Status: DC
  Administered 2021-06-13 – 2021-06-15 (×3): 17.2 mg via ORAL
  Filled 2021-06-13 (×3): qty 2

## 2021-06-13 MED ORDER — LORAZEPAM 0.5 MG PO TABS
0.5000 mg | ORAL_TABLET | Freq: Once | ORAL | Status: AC
  Administered 2021-06-14: 0.5 mg via ORAL
  Filled 2021-06-13: qty 1

## 2021-06-13 NOTE — Progress Notes (Signed)
ANTICOAGULATION CONSULT NOTE  Pharmacy Consult for Heparin Indication:  VTE  Labs: Recent Labs    06/10/21 2222 06/11/21 0157 06/11/21 0158 06/11/21 0800 06/11/21 1510 06/11/21 1728 06/11/21 2221 06/12/21 0423 06/12/21 0647 06/13/21 0024  HGB  --   --  12.1  --   --  10.2*  --  9.8*  --   --   HCT  --   --  40.7  --   --  35.0*  --  32.6*  --   --   PLT  --   --  359  --   --  316  --  304  --   --   HEPARINUNFRC  --  0.91*  --    < >  --   --  0.23*  --  0.38 0.61  CREATININE  --   --  2.25*  --  1.69*  --   --  1.41*  --  1.39*  CKTOTAL  --  1,531*  --   --   --   --   --   --   --   --   TROPONINIHS 6 7  --   --   --   --   --   --   --   --    < > = values in this interval not displayed.    Assessment: 20  yof with Bilateral DVT left leg more symptomatic than the right. Heparin per pharmacy ordered. No AC PTA.   Heparin level came back therapeutic at 0.61 on 1600 units/hr.   Goal of Therapy:  Heparin level 0.3-0.7 units/ml Monitor platelets by anticoagulation protocol: Yes   Plan:  Continue heparin infusion at 1600 units/hr Check anti-Xa level daily while on heparin Continue to monitor H&H and platelets  Thank you Okey Regal, PharmD 06/13/2021 8:38 AM  Please check AMION for all [facility] Pharm[facility] phone numbers After 10:00 PM, call Main Pharmacy [phone]
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
## 2021-06-13 NOTE — Progress Notes (Signed)
Diane Todd   DOB:12-May-2001   RU#:[id number]    ASSESSMENT & PLAN:  Acute, provoked extensive bilateral DVT Overall, I believe her DVT is provoked by recent recurrent hospitalization, immobilization, dehydration, history of tobacco use in the setting of recent diagnosis of autoimmune disorder I recommend continue IV heparin Noted plan for thrombectomy I will defer future anticoagulation therapy to vascular service I recommend minimum 6 months treatment  Acute on chronic renal failure, resolving Secondary to dehydration I encouraged her to be mindful to drink more liquids  Anemia Likely due to chronic kidney disease Observe   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 The patient knows to call the clinic with any problems, questions or concerns.   The total time spent in the appointment was 20 minutes encounter with patients including review of chart and various tests results, discussions about plan of care and coordination of care plan  Artis Delay, MD 06/13/2021 4:54 PM  Subjective:  She is doing ok, still have persistent pain Mother by bedside No recent bleeding Noted plan for surgery  Objective:  Vitals:   06/13/21 0819 06/13/21 1251  BP: 105/61 110/65  Pulse: 81 99  Resp: (!) 23 20  Temp: 98.4 F (36.9 C) 98.6 F (37 C)  SpO2: 100% 100%     Intake/Output Summary (Last 24 hours) at 06/13/2021 1654 Last data filed at 06/13/2021 1637 Gross per 24 hour  Intake 2801.28 ml  Output 250  ml  Net 2551.28 ml    GENERAL:alert, no distress and comfortable HEART: Persistent left lower extremity edema NEURO: alert & oriented x 3 with fluent speech, no focal motor/sensory deficits   Labs:  Recent Labs    05/15/21 0659 05/16/21 0500 06/10/21 1812 06/11/21 0158 06/11/21 1510 06/12/21 0423 06/13/21 0024  NA 144   < > 134* 137 139 142 142  K 3.8   < > 4.8 4.6 4.4 4.2 4.2  CL 117*   < > 100 104 110 114* 116*  CO2 23   < > 21* 22 22 19* 23  GLUCOSE 94   < > 129* 109* 79 80 93  BUN 6   < > 29* 29* 24* 18 12  CREATININE 0.81   < > 2.37* 2.25* 1.69* 1.41* 1.39*  CALCIUM 8.4*   < > 9.5 8.8* 8.2* 8.3* 8.6*  GFRNONAA >60   < > 29* 31* 44* 55* 56*  PROT 4.1*  --  7.8 6.6  --   --   --   ALBUMIN 2.8*  --  3.2* 2.9*  --   --   --   AST 27  --  50* 47*  --   --   --   ALT 27  --  26 25  --   --   --   ALKPHOS 35*  --  81 64  --   --   --   BILITOT 0.5  --  0.8 0.8  --   --   --    < > = values in this  interval not displayed.    Studies:  MR BRAIN WO CONTRAST  Result Date: 06/11/2021 CLINICAL DATA:  Initial evaluation for memory loss. Recent abnormal brain MRI with concern for NMOSD or possibly Wernicke encephalopathy. EXAM: MRI HEAD WITHOUT CONTRAST TECHNIQUE: Multiplanar, multiecho pulse sequences of the brain and surrounding structures were obtained without intravenous contrast. COMPARISON:  Comparison made with previous MRIs from 05/05/2021 and 05/04/2021. FINDINGS: Brain: Cerebral volume stable, and remains within normal limits for age. No abnormal foci of restricted diffusion to suggest acute or subacute ischemia. No encephalomalacia to suggest chronic cortical infarction. No acute intracranial hemorrhage. Previously seen signal changes involving the medial thalami, periaqueductal gray matter, and vestibular nuclei is markedly improved as compared to previous exam. Relatively subtle residual FLAIR signal abnormality now seen involving the medial thalami (series 7, image 17) and  periaqueductal gray matter (series 7, image 14). No significant mass effect. Now seen is associated susceptibility artifact at the medial thalami, new from previous, and likely related to prior inflammation and/or treatment (series 8, image 51). No other new parenchymal signal abnormality seen elsewhere within the brain. No mass lesion, mass effect, or midline shift. Ventricles normal size without hydrocephalus. Pituitary gland and suprasellar region within normal limits. Midline structures intact and normal. Vascular: Major intracranial vascular flow voids are well maintained. Skull and upper cervical spine: Craniocervical junction within normal limits. Visualized upper cervical spine unremarkable. Somewhat diffusely decreased T1 signal intensity seen throughout the visualized bone marrow, likely related to anemia given patient's recent laboratory results. No focal marrow replacing lesion. No scalp soft tissue abnormality. Sinuses/Orbits: Globes and orbital soft tissues demonstrate no acute finding. Left maxillary sinus retention cyst noted. Mild mucosal thickening noted within the underlying maxillary sinuses bilaterally. Trace bilateral mastoid effusions noted, of doubtful significance. Visualized nasopharynx unremarkable. Inner ear structures grossly within normal limits. Other: None. IMPRESSION: 1. Marked interval improvement in previously seen signal changes involving the medial thalami, periaqueductal gray matter, and vestibular nuclei, with relatively subtle residual FLAIR signal abnormality now seen. Associated susceptibility artifact at the medial thalami is new, and likely related to prior inflammation and/or treatment. 2. Otherwise stable and negative brain MRI for age. No other acute intracranial abnormality identified. Electronically Signed   By: Rise Mu M.D.   On: 06/11/2021 21:00   NM Pulmonary Perfusion  Result Date: 06/11/2021 CLINICAL DATA:  PE suspected, high prob. DVT. Shortness  of breath. Chest pain. EXAM: NUCLEAR MEDICINE PERFUSION LUNG SCAN TECHNIQUE: Perfusion images were obtained in multiple projections after intravenous injection of radiopharmaceutical. Ventilation scans intentionally deferred if perfusion scan and chest x-ray adequate for interpretation during COVID 19 epidemic. RADIOPHARMACEUTICALS:  4.2 mCi Tc-44m MAA IV COMPARISON:  Chest x-ray 06/11/2021 FINDINGS: Homogeneous distribution of radiotracer throughout both lung fields. No perfusion defects. IMPRESSION: Normal nuclear medicine perfusion lung scan. No evidence of pulmonary embolism. Electronically Signed   By: Duanne Guess D.O.   On: 06/11/2021 11:57   US Abdomen Complete  Result Date: 06/11/2021 CLINICAL DATA:  Left leg DVT, rule out IVC thrombosis EXAM: ABDOMEN ULTRASOUND COMPLETE COMPARISON:  Renal ultrasound 05/04/2021, CT 03/14/2021 FINDINGS: Gallbladder: No gallstones or wall thickening visualized. No sonographic Murphy sign noted by sonographer. Common bile duct: Diameter: 3.1 mm, nondilated Liver: No focal lesion identified. Within normal limits in parenchymal echogenicity. Portal vein is patent on color Doppler imaging with normal direction of blood flow towards the liver. IVC: Portions of the IVC are suboptimally visualized due to overlying bowel gas. Visible segments are free of discernible  thrombus. Pancreas: Not visualized due to extensive bowel gas. Spleen: Size and appearance within normal limits. Right Kidney: Length: 9.3 cm. Echogenicity is within normal limits. No concerning renal mass. Few small echogenic foci with posterior shadowing could reflect nonobstructing uroliths. No hydronephrosis. Left Kidney: Length: 7.9 cm. Echogenicity is within normal limits. No concerning renal mass, shadowing calculus or hydronephrosis. Abdominal aorta: No aneurysm visualized in the visible segments of abdominal aorta. Other findings: None. IMPRESSION: Technically challenging exam due to patient body habitus  and extensive bowel gas limiting visualization of portions of the abdomen including portions of the IVC. The visible segments of the inferior vena cava appear widely patent however. Few echogenic foci with posterior shadowing in the right kidney, could reflect nonobstructing urolithiasis. No hydronephrosis. Otherwise unremarkable abdominal ultrasound. Electronically Signed   By: Kreg ShropshirePrice  DeHay M.D.   On: 06/11/2021 00:49   DG CHEST PORT 1 VIEW  Result Date: 06/11/2021 CLINICAL DATA:  Tachycardia, DVT EXAM: PORTABLE CHEST 1 VIEW COMPARISON:  None. FINDINGS: No consolidation, features of edema, pneumothorax, or effusion. Pulmonary vascularity is normally distributed. The cardiomediastinal contours are unremarkable. No acute osseous or soft tissue abnormality. IMPRESSION: No acute cardiopulmonary abnormality. Electronically Signed   By: Kreg ShropshirePrice  DeHay M.D.   On: 06/11/2021 00:49   ECHOCARDIOGRAM COMPLETE  Result Date: 06/11/2021    ECHOCARDIOGRAM REPORT   Patient Name:   Diane Todd Date of Exam: 06/11/2021 Medical Rec #:  [id number]           Height:       67.0 in Accession #:    [id number]          Weight:       208.8 lb Date of Birth:  04-04-2001           BSA:          2.059 m Patient Age:    20 years            BP:           94/53 mmHg Patient Gender: F                   HR:           93 bpm. Exam Location:  Inpatient Procedure: 2D Echo, Cardiac Doppler and Color Doppler Indications:    Dyspnea R06.00  History:        Patient has no prior history of Echocardiogram examinations.                 Risk Factors:Hypertension.  Sonographer:    Eulah PontSarah Pirrotta RDCS Referring Phys: Consepcion [id number] ANASTASSIA DOUTOVA IMPRESSIONS  1. Left ventricular ejection fraction, by estimation, is 50 to 55%. The left ventricle has low normal function. The left ventricle demonstrates regional wall motion abnormalities (see scoring diagram/findings for description). Left ventricular diastolic  parameters were normal.  2. Right ventricular  systolic function is normal. The right ventricular size is normal.  3. The mitral valve is normal in structure. No evidence of mitral valve regurgitation. No evidence of mitral stenosis.  4. The aortic valve is tricuspid. Aortic valve regurgitation is not visualized. No aortic stenosis is present.  5. The inferior vena cava is normal in size with greater than 50% respiratory variability, suggesting right atrial pressure of 3 mmHg. FINDINGS  Left Ventricle: Left ventricular ejection fraction, by estimation, is 50 to 55%. The left ventricle has low normal function. The left ventricle demonstrates regional wall motion abnormalities. The left ventricular internal cavity size was  normal in size. There is no left ventricular hypertrophy. Left ventricular diastolic parameters were normal. Normal left ventricular filling pressure. Right Ventricle: The right ventricular size is normal. No increase in right ventricular wall thickness. Right ventricular systolic function is normal. Left Atrium: Left atrial size was normal in size. Right Atrium: Right atrial size was normal in size. Pericardium: There is no evidence of pericardial effusion. Mitral Valve: The mitral valve is normal in structure. No evidence of mitral valve regurgitation. No evidence of mitral valve stenosis. Tricuspid Valve: The tricuspid valve is normal in structure. Tricuspid valve regurgitation is trivial. No evidence of tricuspid stenosis. Aortic Valve: The aortic valve is tricuspid. Aortic valve regurgitation is not visualized. No aortic stenosis is present. Pulmonic Valve: The pulmonic valve was normal in structure. Pulmonic valve regurgitation is not visualized. No evidence of pulmonic stenosis. Aorta: The aortic root is normal in size and structure. Venous: The inferior vena cava is normal in size with greater than 50% respiratory variability, suggesting right atrial pressure of 3 mmHg. IAS/Shunts: No atrial level shunt detected by color flow Doppler.   LEFT VENTRICLE PLAX 2D LVIDd:         4.80 cm  Diastology LVIDs:         3.50 cm  LV e' medial:    11.90 cm/s LV PW:         0.90 cm  LV E/e' medial:  4.9 LV IVS:        0.70 cm  LV e' lateral:   12.20 cm/s LVOT diam:     1.90 cm  LV E/e' lateral: 4.7 LV SV:         46 LV SV Index:   23 LVOT Area:     2.84 cm  RIGHT VENTRICLE RV S prime:     11.30 cm/s TAPSE (M-mode): 1.8 cm LEFT ATRIUM             Index       RIGHT ATRIUM          Index LA diam:        3.40 cm 1.65 cm/m  RA Area:     8.89 cm LA Vol (A2C):   25.6 ml 12.43 ml/m RA Volume:   14.80 ml 7.19 ml/m LA Vol (A4C):   21.0 ml 10.20 ml/m LA Biplane Vol: 25.5 ml 12.38 ml/m  AORTIC VALVE LVOT Vmax:   109.00 cm/s LVOT Vmean:  69.800 cm/s LVOT VTI:    0.164 m  AORTA Ao Root diam: 2.70 cm Ao Asc diam:  2.80 cm MITRAL VALVE               TRICUSPID VALVE MV Area (PHT): 4.89 cm    TR Peak grad:   10.6 mmHg MV Decel Time: 155 msec    TR Vmax:        163.00 cm/s MV E velocity: 57.90 cm/s MV A velocity: 53.30 cm/s  SHUNTS MV E/A ratio:  1.09        Systemic VTI:  0.16 m                            Systemic Diam: 1.90 cm Chilton Si MD Electronically signed by Chilton Si MD Signature Date/Time: 06/11/2021/12:27:43 PM    Final    VAS [place] LOWER EXTREMITY VENOUS (DVT)  Result Date: 06/10/2021  Lower Venous DVT Study Patient Name:  GRAINNE KNIGHTS  Date of Exam:   06/10/2021 Medical Rec #:  [id number]            Accession #:    [id number] Date of Birth: [birth year]            Patient Gender: F Patient Age:   20Y Exam Location:  [facility] Procedure:      VAS [place] LOWER EXTREMITY VENOUS (DVT) Referring Phys: [id number] Marianna Fuss THOMAS --------------------------------------------------------------------------------  Indications: DVT, Pain, and Swelling.  Risk Factors: Recently discharged from hospital on 01/06/21. Performing Technologist: Thereasa Parkin RVT  Examination Guidelines: A complete evaluation includes B-mode imaging, spectral Doppler,  color Doppler, and power Doppler as needed of all accessible portions of each vessel. Bilateral testing is considered an integral part of a complete examination. Limited examinations for reoccurring indications may be performed as noted. The reflux portion of the exam is performed with the patient in reverse Trendelenburg.  +---------+---------------+---------+-----------+----------+--------------+ LEFT     CompressibilityPhasicitySpontaneityPropertiesThrombus Aging +---------+---------------+---------+-----------+----------+--------------+ CFV      None           No       No                                  +---------+---------------+---------+-----------+----------+--------------+ SFJ      None                    No                                  +---------+---------------+---------+-----------+----------+--------------+ FV Prox  None           No       No                                  +---------+---------------+---------+-----------+----------+--------------+ FV Mid   None           No       No                                  +---------+---------------+---------+-----------+----------+--------------+ FV DistalNone           No       No                                  +---------+---------------+---------+-----------+----------+--------------+ PFV      Partial                 Yes                                 +---------+---------------+---------+-----------+----------+--------------+ POP      None           No       No                                  +---------+---------------+---------+-----------+----------+--------------+ PTV      Partial        No       Yes                                 +---------+---------------+---------+-----------+----------+--------------+  PERO     Partial        No       Yes                                 +---------+---------------+---------+-----------+----------+--------------+ Gastroc  None                     No                                  +---------+---------------+---------+-----------+----------+--------------+ GSV      Full           Yes      Yes                                 +---------+---------------+---------+-----------+----------+--------------+ SSV      Full                    Yes                                 +---------+---------------+---------+-----------+----------+--------------+ Occlusive thrombus in the left EIV and CIV. Flow seen in the IVC but cannot rule out partial thrombus as it was not well visualized. The right CIV and EIV appear widely patent.  Findings reported to Dr. Carlis Abbott at 3:45 pm. Patient instructed to go to the [facility] Emergency Dept.  Summary: RIGHT: - Findings consistent with acute deep vein thrombosis involving the right common femoral vein, SF junction, right femoral vein, right proximal profunda vein, right popliteal vein, right posterior tibial veins, right peroneal veins, and right gastrocnemius veins. Thrombus in the left external iliac vein and common iliac vein. IVC not well visualized. Cannot rule out partial IVC thrombus.   *See table(s) above for measurements and observations. Electronically signed by Coral Else MD on 06/10/2021 at 6:48:55 PM.    Final    VAS [place] LOWER EXTREMITY VENOUS (DVT)  Result Date: 06/03/2021  Lower Venous DVT Study Patient Name:  MATTHEW PAIS  Date of Exam:   06/03/2021 Medical Rec #: [id number]            Accession #:    [id number] Date of Birth: 2001-02-09            Patient Gender: F Patient Age:   20Y Exam Location:  [facility] Procedure:      VAS [place] LOWER EXTREMITY VENOUS (DVT) Referring Phys: [id number] Earna Coder T [id number] --------------------------------------------------------------------------------  Indications: Swelling.  Risk Factors: None identified. Comparison Study: No prior studies. Performing Technologist: Chanda Busing RVT  Examination Guidelines: A complete evaluation includes  B-mode imaging, spectral Doppler, color Doppler, and power Doppler as needed of all accessible portions of each vessel. Bilateral testing is considered an integral part of a complete examination. Limited examinations for reoccurring indications may be performed as noted. The reflux portion of the exam is performed with the patient in reverse Trendelenburg.  +---------+---------------+---------+-----------+----------+--------------+ RIGHT    CompressibilityPhasicitySpontaneityPropertiesThrombus Aging +---------+---------------+---------+-----------+----------+--------------+ CFV      Full           Yes      Yes                                 +---------+---------------+---------+-----------+----------+--------------+  SFJ      Full                                                        +---------+---------------+---------+-----------+----------+--------------+ FV Prox  Full                                                        +---------+---------------+---------+-----------+----------+--------------+ FV Mid   Full                                                        +---------+---------------+---------+-----------+----------+--------------+ FV DistalFull                                                        +---------+---------------+---------+-----------+----------+--------------+ PFV      Full                                                        +---------+---------------+---------+-----------+----------+--------------+ POP      Full           Yes      Yes                                 +---------+---------------+---------+-----------+----------+--------------+ PTV      Full                                                        +---------+---------------+---------+-----------+----------+--------------+ PERO     Full                                                         +---------+---------------+---------+-----------+----------+--------------+   +---------+---------------+---------+-----------+----------+--------------+ LEFT     CompressibilityPhasicitySpontaneityPropertiesThrombus Aging +---------+---------------+---------+-----------+----------+--------------+ CFV      Full           Yes      Yes                                 +---------+---------------+---------+-----------+----------+--------------+ SFJ      Full                                                        +---------+---------------+---------+-----------+----------+--------------+  FV Prox  Full                                                        +---------+---------------+---------+-----------+----------+--------------+ FV Mid   Full                                                        +---------+---------------+---------+-----------+----------+--------------+ FV DistalFull                                                        +---------+---------------+---------+-----------+----------+--------------+ PFV      Full                                                        +---------+---------------+---------+-----------+----------+--------------+ POP      Full           Yes      Yes                                 +---------+---------------+---------+-----------+----------+--------------+ PTV      Full                                                        +---------+---------------+---------+-----------+----------+--------------+ PERO     Full                                                        +---------+---------------+---------+-----------+----------+--------------+     Summary: RIGHT: - There is no evidence of deep vein thrombosis in the lower extremity.  - No cystic structure found in the popliteal fossa.  LEFT: - There is no evidence of deep vein thrombosis in the lower extremity.  - No cystic structure found in the popliteal fossa.   *See table(s) above for measurements and observations. Electronically signed by Fabienne Bruns MD on 06/03/2021 at 7:52:25 PM.    Final

## 2021-06-13 NOTE — Progress Notes (Addendum)
  Progress Note    06/13/2021 7:39 AM * No surgery date entered *  Subjective:  Left leg feels about the same   Vitals:   06/13/21 0326 06/13/21 0400  BP: 101/66   Pulse: 92 94  Resp:    Temp: 98.5 F (36.9 C)   SpO2: 100% 96%   Physical Exam: Lungs:  non labored Extremities:  palpable DP pulses Neurologic: A&O  CBC    Component Value Date/Time   WBC 8.4 06/12/2021 0423   RBC 3.45 (L) 06/12/2021 0423   HGB 9.8 (L) 06/12/2021 0423   HCT 32.6 (L) 06/12/2021 0423   PLT 304 06/12/2021 0423   MCV 94.5 06/12/2021 0423   MCH 28.4 06/12/2021 0423   MCHC 30.1 06/12/2021 0423   RDW 18.4 (H) 06/12/2021 0423   LYMPHSABS 1.8 06/11/2021 0158   MONOABS 1.2 (H) 06/11/2021 0158   EOSABS 0.1 06/11/2021 0158   BASOSABS 0.1 06/11/2021 0158    BMET    Component Value Date/Time   NA 142 06/13/2021 0024   K 4.2 06/13/2021 0024   CL 116 (H) 06/13/2021 0024   CO2 23 06/13/2021 0024   GLUCOSE 93 06/13/2021 0024   BUN 12 06/13/2021 0024   CREATININE 1.39 (H) 06/13/2021 0024   CALCIUM 8.6 (L) 06/13/2021 0024   GFRNONAA 56 (L) 06/13/2021 0024    INR No results found for: INR   Intake/Output Summary (Last 24 hours) at 06/13/2021 0739 Last data filed at 06/13/2021 0541 Gross per 24 hour  Intake 3041.39 ml  Output 250 ml  Net 2791.39 ml     Assessment/Plan:  20 y.o. female with bilateral LE DVT * No surgery date entered *   BLE well perfused; no venous compartment syndrome Continue IV heparin perioperatively Plan is for mechanical thrombectomy and possible iliac vein stent tomorrow NPO past midnight Consent   Emilie Rutter, PA-C [facility] [phone] 06/13/2021 7:39 AM  Agree with above.  Patient's left leg symptoms are essentially unchanged.  She still has edema and pain in the left leg.  Her creatinine is improving.  Would continue IV hydration.  She is less tachycardic today.  She overall appears improved.  We will proceed with left leg  thrombectomy possible iliac stenting tomorrow.  N.p.o. after midnight.  Continue heparin.  Risk benefits possible complications and procedure details were discussed at length with the patient today.  These include but are not limited to bleeding infection vessel injury requirement for long-term anticoagulation post procedure.  Fabienne Bruns, MD [facility] [place] Office: [phone]

## 2021-06-13 NOTE — Progress Notes (Signed)
Education provided to patient and mother regarding ambulation after thrombectomy, when appropriate. Educated to get up and walking as much as tolerated to reduce reoccurrence of clot formation. Notified patient we can help with pain reduction to facilitate ambulation. Patient verbally agreeable.

## 2021-06-13 NOTE — Progress Notes (Signed)
Physical Therapy Treatment Patient Details Name: Diane Todd MRN: [id number] DOB: [birth year] Today's Date: 06/13/2021    History of Present Illness 20 y.o. female who presents to [facility] ED on 06/10/2021 with LE edema, found to have extensive bilateral DVTs. Plan for mechanical thrombectomy with iliac vein stenting on 06/14/2021. PMH of NMO, HTN, hypothyroidism.    PT Comments    Pt received in supine, agreeable to therapy session with encouragement, with fair participation and tolerance for bed mobility and seated transfer training. Pt too pain limited to attempt standing transfers this date so focus instead on seated scooting techniques and supine/seated LE exercises for strengthening. Pt tachy with seated activities and BP elevated with pt c/o mild dizziness, deferred transfer to chair for safety. Pt continues to benefit from PT services to progress toward functional mobility goals. Disposition below pending progress post-op, plan for thrombectomy next date.  Follow Up Recommendations  Home health PT;Supervision/Assistance - 24 hour (if no progression by next session will need to consider inpatient placement)     Equipment Recommendations   (TBD)    Recommendations for Other Services       Precautions / Restrictions Precautions Precautions: Fall Precaution Comments: Contact precautions Restrictions Weight Bearing Restrictions: No    Mobility  Bed Mobility Overal bed mobility: Needs Assistance Bed Mobility: Rolling;Sidelying to Sit;Sit to Sidelying Rolling: Supervision Sidelying to sit: Mod assist;+2 for safety/equipment     Sit to sidelying: Mod assist;+2 for safety/equipment General bed mobility comments: rolling multiple reps for repositioning of bed pads, pt able to bridge hips partially with RLE to don briefs/pad prior to EOB transition    Transfers Overall transfer level: Needs assistance   Transfers: Lateral/Scoot Transfers          Lateral/Scoot Transfers:  Max assist;Total assist;+2 physical assistance General transfer comment: pt with great difficulty clearing hips from EOB with attempt at lateral seated scoot, c/o LLE pain/guarding and with assist to reduce pressure on LLE, still with poor propulsion achieved with +2 assist; HR elevated to 140's bpm and deferred OOB to chair transfer for safety.  Ambulation/Gait                 Stairs             Wheelchair Mobility    Modified Rankin (Stroke Patients Only)       Balance Overall balance assessment: Needs assistance Sitting-balance support: No upper extremity supported;Feet supported Sitting balance-Leahy Scale: Good         Standing balance comment: defer, pt too pain limited on LLE to attempt and tachy with seated scooting                            Cognition Arousal/Alertness: Awake/alert Behavior During Therapy: WFL for tasks assessed/performed;Anxious Overall Cognitive Status: Within Functional Limits for tasks assessed                                 General Comments: Cooperative, pt hesitant to initiate mobility tasks due to pain, needed instruction on importance of mobility for improved hemodynamics.      Exercises General Exercises - Lower Extremity Ankle Circles/Pumps: AROM;Both;AAROM;10 reps;Supine;Seated (~5 reps supine and 5 seated) Quad Sets: AROM;Both;5 reps;Supine Gluteal Sets: AROM;Both;10 reps;Supine Straight Leg Raises: AROM;Both;5 reps;Supine Other Exercises Other Exercises: PTA encourages performance of bridges as tolerated for hip extension strength    General Comments General  comments (skin integrity, edema, etc.): pt HR 80's-90's bpm at rest and up to 140 bpm with seated scooting/exertion; BP 147/84 (101) seated EOB and c/o severe LLE pain (BP had been 110/65 when taken in supine earlier in day, likely elevated due to pain)      Pertinent Vitals/Pain Pain Assessment: 0-10 Pain Score: 8  Pain Location:  LLE Pain Descriptors / Indicators: Aching;Discomfort;Throbbing Pain Intervention(s): Limited activity within patient's tolerance;Monitored during session;Repositioned    Home Living                      Prior Function            PT Goals (current goals can now be found in the care plan section) Acute Rehab PT Goals Patient Stated Goal: to reduce pain and improve mobility PT Goal Formulation: With patient Time For Goal Achievement: 06/26/21 Potential to Achieve Goals: Fair Progress towards PT goals: Progressing toward goals    Frequency    Min 4X/week      PT Plan Current plan remains appropriate    Co-evaluation              AM-PAC PT "6 Clicks" Mobility   Outcome Measure  Help needed turning from your back to your side while in a flat bed without using bedrails?: A Little Help needed moving from lying on your back to sitting on the side of a flat bed without using bedrails?: A Lot Help needed moving to and from a bed to a chair (including a wheelchair)?: Total Help needed standing up from a chair using your arms (e.g., wheelchair or bedside chair)?: Total Help needed to walk in hospital room?: Total Help needed climbing 3-5 steps with a railing? : Total 6 Click Score: 9    End of Session   Activity Tolerance: Patient limited by pain Patient left: in bed;with call bell/phone within reach;with bed alarm set;with family/visitor present (heels floated, MD entering room) Nurse Communication: Mobility status;Other (comment) (tachy and severe pain with LLE in dependent position) PT Visit Diagnosis: Other abnormalities of gait and mobility (R26.89);Muscle weakness (generalized) (M62.81);Pain Pain - Right/Left: Left Pain - part of body: Leg     Time: 0177-9390 PT Time Calculation (min) (ACUTE ONLY): 22 min  Charges:  $Therapeutic Activity: 8-22 mins                     Diane Todd P., PTA Acute Rehabilitation Services Pager: [phone] Office:  [phone]    Diane Todd 06/13/2021, 5:00 PM

## 2021-06-14 ENCOUNTER — Encounter (HOSPITAL_COMMUNITY): Payer: Self-pay | Admitting: Vascular Surgery

## 2021-06-14 ENCOUNTER — Inpatient Hospital Stay (HOSPITAL_COMMUNITY)
Admission: EM | Disposition: A | Discharge status: Home Health | Payer: Self-pay | Source: Home / Self Care | Attending: Internal Medicine

## 2021-06-14 ENCOUNTER — Ambulatory Visit (HOSPITAL_COMMUNITY): Admit: 2021-06-14 | Payer: Medicaid Other | Admitting: Vascular Surgery

## 2021-06-14 DIAGNOSIS — I82402 Acute embolism and thrombosis of unspecified deep veins of left lower extremity: Secondary | ICD-10-CM

## 2021-06-14 HISTORY — PX: PERIPHERAL VASCULAR THROMBECTOMY: CATH118306

## 2021-06-14 LAB — BASIC METABOLIC PANEL
Anion gap: 8 (ref 5–15)
BUN: 5 mg/dL — ABNORMAL LOW (ref 6–20)
CO2: 21 mmol/L — ABNORMAL LOW (ref 22–32)
Calcium: 8.5 mg/dL — ABNORMAL LOW (ref 8.9–10.3)
Chloride: 112 mmol/L — ABNORMAL HIGH (ref 98–111)
Creatinine, Ser: 1.22 mg/dL — ABNORMAL HIGH (ref 0.44–1.00)
GFR, Estimated: >60 mL/min (ref >60)
Glucose, Bld: 105 mg/dL — ABNORMAL HIGH (ref 70–99)
Potassium: 3.3 mmol/L — ABNORMAL LOW (ref 3.5–5.1)
Sodium: 141 mmol/L (ref 135–145)

## 2021-06-14 LAB — CBC
HCT: 31.4 % — ABNORMAL LOW (ref 36.0–46.0)
Hemoglobin: 9.6 g/dL — ABNORMAL LOW (ref 12.0–15.0)
MCH: 28.8 pg (ref 26.0–34.0)
MCHC: 30.6 g/dL (ref 30.0–36.0)
MCV: 94.3 fL (ref 80.0–100.0)
Platelets: 351 10*3/uL (ref 150–400)
RBC: 3.33 MIL/uL — ABNORMAL LOW (ref 3.87–5.11)
RDW: 18.8 % — ABNORMAL HIGH (ref 11.5–15.5)
WBC: 7.2 10*3/uL (ref 4.0–10.5)
nRBC: 0.3 % — ABNORMAL HIGH (ref 0.0–0.2)

## 2021-06-14 LAB — VITAMIN B1
Vitamin B1 (Thiamine): 141.3 nmol/L (ref 66.5–200.0)
Vitamin B1 (Thiamine): 154 nmol/L (ref 66.5–200.0)

## 2021-06-14 LAB — HEPARIN LEVEL (UNFRACTIONATED): Heparin Unfractionated: 0.35 IU/mL (ref 0.30–0.70)

## 2021-06-14 SURGERY — PERIPHERAL VASCULAR THROMBECTOMY
Anesthesia: LOCAL

## 2021-06-14 MED ORDER — HEPARIN SODIUM (PORCINE) 1000 UNIT/ML IJ SOLN
INTRAMUSCULAR | Status: DC | PRN
  Administered 2021-06-14: 8000 [IU] via INTRAVENOUS
  Administered 2021-06-14: 5000 [IU] via INTRAVENOUS

## 2021-06-14 MED ORDER — FENTANYL CITRATE (PF) 100 MCG/2ML IJ SOLN
INTRAMUSCULAR | Status: AC
  Filled 2021-06-14: qty 2

## 2021-06-14 MED ORDER — SODIUM CHLORIDE 0.9 % IV SOLN: 250.0000 mL | INTRAVENOUS | Status: DC | PRN

## 2021-06-14 MED ORDER — MIDAZOLAM HCL 2 MG/2ML IJ SOLN
INTRAMUSCULAR | Status: DC | PRN
  Administered 2021-06-14: 2 mg via INTRAVENOUS

## 2021-06-14 MED ORDER — HEPARIN (PORCINE) IN NACL 1000-0.9 UT/500ML-% IV SOLN
INTRAVENOUS | Status: AC
  Filled 2021-06-14: qty 500

## 2021-06-14 MED ORDER — HEPARIN SODIUM (PORCINE) 1000 UNIT/ML IJ SOLN
INTRAMUSCULAR | Status: AC
  Filled 2021-06-14: qty 1

## 2021-06-14 MED ORDER — SODIUM CHLORIDE 0.9% FLUSH: 3.0000 mL | INTRAVENOUS | Status: DC | PRN

## 2021-06-14 MED ORDER — FENTANYL CITRATE (PF) 100 MCG/2ML IJ SOLN
INTRAMUSCULAR | Status: DC | PRN
  Administered 2021-06-14 (×2): 25 ug via INTRAVENOUS
  Administered 2021-06-14: 50 ug via INTRAVENOUS

## 2021-06-14 MED ORDER — MORPHINE SULFATE (PF) 2 MG/ML IV SOLN: 2.0000 mg | INTRAVENOUS | Status: DC | PRN

## 2021-06-14 MED ORDER — MIDAZOLAM HCL 2 MG/2ML IJ SOLN
INTRAMUSCULAR | Status: AC
  Filled 2021-06-14: qty 2

## 2021-06-14 MED ORDER — HYDRALAZINE HCL 20 MG/ML IJ SOLN: 5.0000 mg | INTRAMUSCULAR | Status: DC | PRN

## 2021-06-14 MED ORDER — SODIUM CHLORIDE 0.45 % IV SOLN
INTRAVENOUS | Status: AC
  Filled 2021-06-14 (×2): qty 1000

## 2021-06-14 MED ORDER — ONDANSETRON HCL 4 MG/2ML IJ SOLN: 4.0000 mg | Freq: Four times a day (QID) | INTRAMUSCULAR | Status: DC | PRN

## 2021-06-14 MED ORDER — SODIUM CHLORIDE 0.9% FLUSH
3.0000 mL | Freq: Two times a day (BID) | INTRAVENOUS | Status: DC
  Administered 2021-06-14 – 2021-06-16 (×3): 3 mL via INTRAVENOUS

## 2021-06-14 MED ORDER — LIDOCAINE HCL (PF) 1 % IJ SOLN
INTRAMUSCULAR | Status: AC
  Filled 2021-06-14: qty 30

## 2021-06-14 MED ORDER — OXYCODONE HCL 5 MG PO TABS
5.0000 mg | ORAL_TABLET | ORAL | Status: DC | PRN
  Administered 2021-06-15: 5 mg via ORAL
  Filled 2021-06-14: qty 1

## 2021-06-14 MED ORDER — HEPARIN (PORCINE) 25000 UT/250ML-% IV SOLN
1500.0000 [IU]/h | INTRAVENOUS | Status: DC
  Administered 2021-06-14 (×2): 1600 [IU]/h via INTRAVENOUS
  Filled 2021-06-14: qty 250

## 2021-06-14 MED ORDER — SODIUM CHLORIDE 0.9 % IV SOLN: INTRAVENOUS | Status: AC

## 2021-06-14 MED ORDER — IODIXANOL 320 MG/ML IV SOLN
INTRAVENOUS | Status: DC | PRN
  Administered 2021-06-14: 55 mL via INTRAVENOUS

## 2021-06-14 MED ORDER — ACETAMINOPHEN 325 MG PO TABS: 650.0000 mg | ORAL_TABLET | ORAL | Status: DC | PRN

## 2021-06-14 MED ORDER — LABETALOL HCL 5 MG/ML IV SOLN
10.0000 mg | INTRAVENOUS | Status: DC | PRN
Start: 2021-06-14 — End: 2021-06-16

## 2021-06-14 MED ORDER — HEPARIN (PORCINE) IN NACL 1000-0.9 UT/500ML-% IV SOLN
INTRAVENOUS | Status: DC | PRN
  Administered 2021-06-14: 500 mL

## 2021-06-14 SURGICAL SUPPLY — 16 items
BALLN MUSTANG 10X80X75 (BALLOONS) ×2
BALLN MUSTANG 8.0X40 75 (BALLOONS) ×2
BALLOON MUSTANG 10X80X75 (BALLOONS) IMPLANT
BALLOON MUSTANG 8.0X40 75 (BALLOONS) IMPLANT
CATH INFINITI VERT 5FR 125CM (CATHETERS) ×1 IMPLANT
CATH VISIONS PV .035 IVUS (CATHETERS) ×1 IMPLANT
GLIDEWIRE ADV .035X260CM (WIRE) ×1 IMPLANT
KIT ENCORE 26 ADVANTAGE (KITS) ×1 IMPLANT
KIT MICROPUNCTURE NIT STIFF (SHEATH) ×1 IMPLANT
PROTECTION STATION PRESSURIZED (MISCELLANEOUS) ×2
SHEATH CLOT RETRIEVER (SHEATH) ×1 IMPLANT
SHEATH PINNACLE 9F 10CM (SHEATH) ×1 IMPLANT
SHEATH PROBE COVER 6X72 (BAG) ×1 IMPLANT
STATION PROTECTION PRESSURIZED (MISCELLANEOUS) IMPLANT
TRAY PV CATH (CUSTOM PROCEDURE TRAY) ×1 IMPLANT
WIRE BENTSON .035X145CM (WIRE) ×1 IMPLANT

## 2021-06-14 NOTE — Progress Notes (Addendum)
ANTICOAGULATION CONSULT NOTE  Pharmacy Consult for Heparin Indication:  VTE  Labs: Recent Labs    06/11/21 1728 06/11/21 2221 06/12/21 0423 06/12/21 0647 06/13/21 0024 06/14/21 0104 06/14/21 0130  HGB 10.2*  --  9.8*  --   --   --  9.6*  HCT 35.0*  --  32.6*  --   --   --  31.4*  PLT 316  --  304  --   --   --  351  HEPARINUNFRC  --    < >  --  0.38 0.61 0.35  --   CREATININE  --   --  1.41*  --  1.39* 1.22*  --    < > = values in this interval not displayed.    Assessment: 20  yof with Bilateral DVT left leg more symptomatic than the right. Heparin per pharmacy ordered. No AC PTA.   Heparin level remains therapeutic at 0.35, on 1600 units/hr. Hgb 9.6, plt 351. No s/sx of bleeding or infusion issues.   Goal of Therapy:  Heparin level 0.3-0.7 units/ml Monitor platelets by anticoagulation protocol: Yes   Plan:  Continue heparin infusion at 1600 units/hr Check anti-Xa level daily while on heparin Continue to monitor H&H and platelets Plan for L leg thrombectomy with poss iliac stenting today   Thank you, Sherron Monday, PharmD, BCCCP Clinical Pharmacist  Phone: [phone] 06/14/2021 7:34 AM  Please check AMION for all [facility] Pharmacy phone numbers After 10:00 PM, call Main Pharmacy [phone]  ADDENDUM Underwent mechanical thrombectomy and angioplasty 7/29 - okay per vascular to restart heparin at previous rate. Was previously therapeutic at 1600 units/hr - will restart and get level with AM labs. F/u oral AC plan.  Sherron Monday, PharmD, BCCCP Clinical Pharmacist

## 2021-06-14 NOTE — TOC Initial Note (Signed)
Transition of Care [facility]) - Initial/Assessment Note    Patient Details  Name: Diane Todd MRN: [id number] Date of Birth: [birth year]  Transition of Care [facility]) CM/SW Contact:    Tom-Johnson, Hershal Coria, RN Phone Number: 06/14/2021, 5:05 PM  Clinical Narrative:                 Case Manager consulted for home health services as well as DME needs. Spoke with both patient and her mother at bedside. Patient stated she does not have any DME needs as she has a rolling walker, shower chair and wheelchair at home. Patient states she does not have any preference and authorizes case manager to choose one that will accept her care. [facility] called and spoke with [place]. Called back later and states they will not accept patient at this time but to try again over the weekend. Will continue to follow up for home health needs.  Expected Discharge Plan: Home w Home Health Services Barriers to Discharge: Continued Medical Work up   Patient Goals and CMS Choice Patient states their goals for this hospitalization and ongoing recovery are:: To go home CMS Medicare.gov Compare Post Acute Care list provided to:: Patient Choice offered to / list presented to : Patient, Parent (Mother)  Expected Discharge Plan and Services Expected Discharge Plan: Home w Home Health Services   Discharge Planning Services: CM Consult Post Acute Care Choice: Home Health Living arrangements for the past 2 months: Single Family Home                 DME Arranged: N/A         HH Arranged: RN, PT, OT HH Agency: [facility] ([facility] Date HH Agency Contacted: 06/14/21 Time HH Agency Contacted: 1635 Representative spoke with at [facility] Agency: Pearson Grippe  Prior Living Arrangements/Services Living arrangements for the past 2 months: Single Family Home Lives with:: Parents Patient language and need for interpreter reviewed:: No Do you feel safe going back to the place where you live?: Yes      Need  for Family Participation in Patient Care: Yes (Comment) Care giver support system in place?: Yes (comment) Current home services: DME (Mother states they have a walker, shower chair and wheelchair.) Criminal Activity/Legal Involvement Pertinent to Current Situation/Hospitalization: No - Comment as needed  Activities of Daily Living Home Assistive Devices/Equipment: Wheelchair ADL Screening (condition at time of admission) Patient's cognitive ability adequate to safely complete daily activities?: Yes Is the patient deaf or have difficulty hearing?: No Does the patient have difficulty seeing, even when wearing glasses/contacts?: No Does the patient have difficulty concentrating, remembering, or making decisions?: No Patient able to express need for assistance with ADLs?: Yes Does the patient have difficulty dressing or bathing?: Yes Independently performs ADLs?: No Communication: Independent Dressing (OT): Needs assistance Is this a change from baseline?: Pre-admission baseline Grooming: Needs assistance Is this a change from baseline?: Pre-admission baseline Feeding: Independent Bathing: Needs assistance Is this a change from baseline?: Pre-admission baseline Toileting: Needs assistance Is this a change from baseline?: Pre-admission baseline In/Out Bed: Needs assistance Is this a change from baseline?: Pre-admission baseline Walks in Home: Needs assistance Is this a change from baseline?: Pre-admission baseline Does the patient have difficulty walking or climbing stairs?: Yes Weakness of Legs: Both Weakness of Arms/Hands: Both  Permission Sought/Granted Permission sought to share information with : Case Manager, Magazine features editor Permission granted to share information with : Yes, Verbal Permission Granted     Permission granted  to share info w AGENCY: [facility]        Emotional Assessment Appearance:: Appears stated age Attitude/Demeanor/Rapport:  Engaged Affect (typically observed): Accepting Orientation: : Oriented to Self, Oriented to Place, Oriented to  Time, Oriented to Situation Alcohol / Substance Use: Never Used Psych Involvement: No (comment)  Admission diagnosis:  Tachycardia [R00.0] DVT (deep venous thrombosis) (HCC) [I82.409] 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.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] Acute deep vein thrombosis (DVT) of other specified vein of both lower extremities (HCC) [I82.493] Patient Active Problem List   Diagnosis Date Noted   DVT (deep venous thrombosis) (HCC) 06/10/2021   AKI (acute kidney injury) (HCC) 06/10/2021   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 [place] [address] Phone: [phone] Fax: [phone]     Social Determinants of Health (SDOH) Interventions    Readmission Risk Interventions No flowsheet data found.
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
## 2021-06-14 NOTE — Progress Notes (Signed)
Physical Therapy Treatment Patient Details Name: Diane Todd MRN: [id number] DOB: [birth year] Today's Date: 06/14/2021    History of Present Illness 20 y.o. 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 Plan for mechanical thrombectomy with iliac vein stenting on 06/14/2021. 7/19 s/p L LE thrombectomy.  PMH of NMO, HTN, hypothyroidism.    PT Comments    Progressing slowly toward goals due to pain and recent thrombectomy.  Emphasis on transitions, sit to stands, scoot transfers and trials of pre gait and attempts to do "swing to" pattern.    Follow Up Recommendations  Home health PT;Supervision/Assistance - 24 hour     Equipment Recommendations  Other (comment) (still TBD)    Recommendations for Other Services       Precautions / Restrictions Precautions Precautions: Fall Precaution Comments: Contact precautions    Mobility  Bed Mobility Overal bed mobility: Needs Assistance Bed Mobility: Rolling;Sidelying to Sit;Sit to Sidelying Rolling: Supervision Sidelying to sit: Min guard (use of the rail)   Sit to supine: Min assist        Transfers Overall transfer level: Needs assistance Equipment used: Rolling walker (2 wheeled)   Sit to Stand: Min assist        Lateral/Scoot Transfers: Mod assist General transfer comment: cues for hand placement/sequencing.  stability assist.  Pt unable to lift her body weight on her UE as of today.  face to face scoot transfers from bottom of the bed toward [facility]  Ambulation/Gait             General Gait Details: not able today   Stairs             Wheelchair Mobility    Modified Rankin (Stroke Patients Only)       Balance Overall balance assessment: Needs assistance   Sitting balance-Leahy Scale: Good     Standing balance support: Bilateral upper extremity supported Standing balance-Leahy Scale: Poor Standing balance comment: stood steadily with bil  UE assist, pt unable to do any pregait tasks.                            Cognition Arousal/Alertness: Awake/alert Behavior During Therapy: WFL for tasks assessed/performed;Anxious Overall Cognitive Status: Within Functional Limits for tasks assessed                                 General Comments: Cooperative, pt hesitant to initiate mobility tasks due to pain, needed instruction on importance of mobility for improved hemodynamics.  Left her with "homework" for the w/e.      Exercises Other Exercises Other Exercises: gentle warm up ROM L LE    General Comments        Pertinent Vitals/Pain Pain Assessment: Faces Faces Pain Scale: Hurts even more Pain Location: LLE Pain Descriptors / Indicators: Aching;Discomfort;Throbbing Pain Intervention(s): Monitored during session;Limited activity within patient's tolerance    Home Living                      Prior Function            PT Goals (current goals can now be found in the care plan section) Acute Rehab PT Goals Patient Stated Goal: to reduce pain and improve mobility PT Goal Formulation: With patient Time For Goal Achievement: 06/26/21 Potential to Achieve Goals: Fair Progress towards PT  goals: Progressing toward goals    Frequency    Min 4X/week      PT Plan Current plan remains appropriate    Co-evaluation              AM-PAC PT "6 Clicks" Mobility   Outcome Measure  Help needed turning from your back to your side while in a flat bed without using bedrails?: A Little Help needed moving from lying on your back to sitting on the side of a flat bed without using bedrails?: A Lot Help needed moving to and from a bed to a chair (including a wheelchair)?: A Lot Help needed standing up from a chair using your arms (e.g., wheelchair or bedside chair)?: A Lot Help needed to walk in hospital room?: A Lot Help needed climbing 3-5 steps with a railing? : A Lot 6 Click Score:  13    End of Session   Activity Tolerance: Patient limited by pain Patient left: in bed;with call bell/phone within reach;with bed alarm set;with family/visitor present Nurse Communication: Mobility status PT Visit Diagnosis: Other abnormalities of gait and mobility (R26.89);Muscle weakness (generalized) (M62.81);Pain Pain - Right/Left: Left Pain - part of body: Leg     Time: 1700-1721 PT Time Calculation (min) (ACUTE ONLY): 21 min  Charges:  $Therapeutic Activity: 8-22 mins                     06/14/2021  Jacinto Halim., PT Acute Rehabilitation Services [phone]  (pager) [phone]  (office)   Eliseo Gum Hameed Kolar 06/14/2021, 6:08 PM

## 2021-06-14 NOTE — Plan of Care (Signed)
  Problem: Activity: Goal: Risk for activity intolerance will decrease Outcome: Not Progressing Education provided on bed mobility and keeping leg elevated while unable to tolerate weight on left leg.   Problem: Coping: Goal: Level of anxiety will decrease Outcome: Not Progressing  Anxiety related to multiple medication conditions leaving patient feeling overwhelmed. Therapeutic communication found to be helpful and PRN medication to be given as needed to reduce symptoms of anxiety.

## 2021-06-14 NOTE — Progress Notes (Signed)
Pt arrived back to floor from cathlab, L popliteal drsg clean and intact.  Orders released.

## 2021-06-14 NOTE — Op Note (Signed)
Procedure: Ultrasound left popliteal vein, left popliteal venous access, intravascular ultrasound left popliteal superficial femoral common femoral external iliac common iliac inferior vena cava, left iliac and inferior venacavogram, angioplasty left common iliac vein, mechanical thrombectomy (Inari) inferior vena cava left common iliac left external iliac left common femoral left superficial femoral left popliteal vein  Preoperative diagnosis: Extensive DVT left lower extremity  Postoperative diagnosis: Same  Anesthesia: Local with IV sedation 89 minutes sedation time  Operative findings: 1.  Large amount of thrombus extending from the distal inferior vena cava all the way to the popliteal vein including the profunda vein  2.  Mild compression/narrowing left common iliac vein angioplastied to 10 mm  Operative details: After obtaining informed consent, the patient taken to the PV lab.  The patient was placed in supine position angio table.  Patient was placed then in prone position.  The posterior aspect of the left popliteal was prepped and draped in usual sterile fashion.  Ultrasound was used to identify the left popliteal vein.  A small nick was made in the skin.  A micropuncture needle was then used to cannulate the left popliteal vein and a micropuncture wire advanced into this.  This was confirmed under fluoroscopy.  Micropuncture sheath was then placed over the microwire.  Patient was given a total of 13,000 units of intravenous heparin.  Swapped out for an 035 Glidewire advantage which was advanced all the way up to the level of the left subclavian vein.  We confirmed we were in the venous circulation with a gently injected contrast angiogram.  There was a large amount of thrombus within the popliteal and superficial femoral vein.  At this point the micropuncture sheath was swapped out for a 9 [place] sheath.  The IVUS catheter was then placed through the 9 French sheath up into the inferior vena  cava.  This was used to examine the area of thrombus.  There was thrombus right at the confluence of the iliac veins in the distal inferior vena cava and extending all the way down into the popliteal vein on the left side.  At this point the Inari device was brought up in the operative field.  The existing 9 French sheath was swapped out over the wire for a Inari sheath.  I then made 4 discrete passes with the NRA sheath from the distal inferior vena cava common iliac external iliac common femoral vein superficial femoral and popliteal veins.  A large amount of thrombus was retrieved.  On her fifth pass we had fairly minimal thrombus burden.  I then placed the IVUS catheter back up and about 90% of the thrombus had been cleared from the caval and iliac segments.  There still was some residual thrombus in the popliteal vein.  At this point IVUS was used to make measurements of the left common iliac vein.  There was some compression.  However the Caver was also fairly flattened suggestive of some dehydration level.  We also noted on completion venogram that there was not much flow coming from the profunda vein and I was concerned that the stent would not have adequate inflow to maintain patency.  Therefore we decided to angioplasty the left common iliac vein.  A 10 x 40 angioplasty balloon was advanced and centered at the level of the caval iliac junction.  This was then inflated to nominal pressure for 2 overlapping inflations.  Completion angiogram showed no evidence of extravasation and a patent left common iliac system.  There was  a small amount of waist on the balloon when inflated to nominal pressure.  At this point the guidewire was removed the sheath was removed and the insertion site closed with a Monocryl stitch.  The patient tolerated procedure well and there were no complications.  The instrument sponge and needle counts correct the end of the case.  The patient was taken to the holding area in stable  condition.  Operative management: Patient can be started on oral anticoagulation at this point and transition off of IV heparin.  She should be maintained on continuous anticoagulation with as she will be high risk for rethrombosis in the early.  She should be maintained on oral anticoagulation for at least 6 months and then reevaluate.  If she were to reocclude her entire leg again consideration would be given for placing a left common iliac stent.  I would only consider this if she has adequate profunda flow though to maintain stent flow and patency.  Fabienne Bruns, MD [facility] Office: [phone]

## 2021-06-14 NOTE — Interval H&P Note (Signed)
History and Physical Interval Note:  06/14/2021 11:46 AM  Diane Todd  has presented today for surgery, with the diagnosis of dvt.  The various methods of treatment have been discussed with the patient and family. After consideration of risks, benefits and other options for treatment, the patient has consented to  Procedure(s): PERIPHERAL VASCULAR THROMBECTOMY (N/A) as a surgical intervention.  The patient's history has been reviewed, patient examined, no change in status, stable for surgery.  I have reviewed the patient's chart and labs.  Questions were answered to the patient's satisfaction.     Fabienne Bruns

## 2021-06-15 LAB — CBC
HCT: 31.7 % — ABNORMAL LOW (ref 36.0–46.0)
Hemoglobin: 9.4 g/dL — ABNORMAL LOW (ref 12.0–15.0)
MCH: 28 pg (ref 26.0–34.0)
MCHC: 29.7 g/dL — ABNORMAL LOW (ref 30.0–36.0)
MCV: 94.3 fL (ref 80.0–100.0)
Platelets: 327 10*3/uL (ref 150–400)
RBC: 3.36 MIL/uL — ABNORMAL LOW (ref 3.87–5.11)
RDW: 18.9 % — ABNORMAL HIGH (ref 11.5–15.5)
WBC: 8.9 10*3/uL (ref 4.0–10.5)
nRBC: 0.2 % (ref 0.0–0.2)

## 2021-06-15 LAB — BASIC METABOLIC PANEL
Anion gap: 6 (ref 5–15)
BUN: 5 mg/dL — ABNORMAL LOW (ref 6–20)
CO2: 23 mmol/L (ref 22–32)
Calcium: 8.6 mg/dL — ABNORMAL LOW (ref 8.9–10.3)
Chloride: 113 mmol/L — ABNORMAL HIGH (ref 98–111)
Creatinine, Ser: 1.21 mg/dL — ABNORMAL HIGH (ref 0.44–1.00)
GFR, Estimated: >60 mL/min (ref >60)
Glucose, Bld: 104 mg/dL — ABNORMAL HIGH (ref 70–99)
Potassium: 4 mmol/L (ref 3.5–5.1)
Sodium: 142 mmol/L (ref 135–145)

## 2021-06-15 LAB — NASOPHARYNGEAL CULTURE: Culture: NOT DETECTED

## 2021-06-15 LAB — HEPARIN LEVEL (UNFRACTIONATED): Heparin Unfractionated: 0.87 IU/mL — ABNORMAL HIGH (ref 0.30–0.70)

## 2021-06-15 MED ORDER — APIXABAN 5 MG PO TABS
10.0000 mg | ORAL_TABLET | Freq: Two times a day (BID) | ORAL | Status: DC
  Administered 2021-06-15 – 2021-06-16 (×3): 10 mg via ORAL
  Filled 2021-06-15 (×3): qty 2

## 2021-06-15 MED ORDER — PROPRANOLOL HCL 10 MG PO TABS
10.0000 mg | ORAL_TABLET | Freq: Every day | ORAL | Status: DC
  Administered 2021-06-15 – 2021-06-16 (×2): 10 mg via ORAL
  Filled 2021-06-15 (×2): qty 1

## 2021-06-15 MED ORDER — APIXABAN 5 MG PO TABS: 5.0000 mg | ORAL_TABLET | Freq: Two times a day (BID) | ORAL | Status: DC

## 2021-06-15 NOTE — Progress Notes (Signed)
ANTICOAGULATION CONSULT NOTE  Pharmacy Consult for Heparin, apixaban Indication:  bilateral DVT  Labs: Recent Labs    06/13/21 0024 06/14/21 0104 06/14/21 0130 06/15/21 0122  HGB  --   --  9.6* 9.4*  HCT  --   --  31.4* 31.7*  PLT  --   --  351 327  HEPARINUNFRC 0.61 0.35  --  0.87*  CREATININE 1.39* 1.22*  --  1.21*    Assessment: 20 yo female with extensive bilateral DVTs provoked by prolonged hospitalization. Pharmacy consulted to dose heparin, now transitioning to Eliquis. No PTA anticoagulation.    Previous heparin level supratherapeutic at 0.87, reduced rate and likely now in therapeutic range. Hb slightly low but stable (9.6, BL ~13), platelets normal. No s/sx of bleeding or infusion issues.   Goal of Therapy:  Heparin level 0.3-0.7 units/ml Monitor platelets by anticoagulation protocol: Yes   Plan:  Discontinue heparin infusion Start Eliquis 10 mg BID x7d, then 5 mg BID for at least 6 months Monitor renal function, CBC, and s/sx of bleeding  Filbert Schilder, PharmD PGY1 Pharmacy Resident 06/15/2021  8:50 AM  Please check [email or web address] for unit-specific pharmacy phone numbers.
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
## 2021-06-15 NOTE — Progress Notes (Signed)
Physical Therapy Treatment Patient Details Name: Diane Todd MRN: [id number] DOB: 2001-02-17 Today's Date: 06/15/2021    History of Present Illness 20 y.o. female who presents to [facility] ED on 06/10/2021 with LE edema, found to have extensive bilateral DVTs. Plan for mechanical thrombectomy with iliac vein stenting on 06/14/2021. 7/19 s/p L LE thrombectomy.  PMH of NMO, HTN, hypothyroidism.    PT Comments    Pt progression remains to be limited by 10/10 pain in L calf and inc HR into 170-80s bpm with activity. Pt given max encouragement to complete HEP while in bed and chair. Worked on standing and standing tolerance while achieving L foot flat on floor. Acute PT to cont to follow.   Follow Up Recommendations  Home health PT;Supervision/Assistance - 24 hour - pt may need SNF if continues to have slower progression     Equipment Recommendations  Other (comment) (still TBD)    Recommendations for Other Services       Precautions / Restrictions Precautions Precautions: Fall Precaution Comments: Contact precautions, watch HR, increased to 182bpm with mobility Restrictions Weight Bearing Restrictions: No Other Position/Activity Restrictions: self limiting L WBing tolerance due to pain    Mobility  Bed Mobility Overal bed mobility: Needs Assistance Bed Mobility: Supine to Sit     Supine to sit: Min assist     General bed mobility comments: pt in long sit in the bed, pt pulled on her L sock with her R hand to bring her L LE off EOB, max verbal cues to scoot to EOB    Transfers Overall transfer level: Needs assistance Equipment used: Rolling walker (2 wheeled) Transfers: Lateral/Scoot Transfers;Sit to/from Stand Sit to Stand: Max assist;+2 physical assistance        Lateral/Scoot Transfers: Mod assist General transfer comment: attempted to stand x4 to transfer to chair however unsuccessful due to pt stating "I can't do it, it hurts too bad and impulsively sitting down on  EOB". Pt requiring max encouragement to push through expected pain but to report any new pain. Pts HR did increase reaching as high as 182bpm. max directional verbal cues to lateral scoot to chair, PT provided modA to help clear bottom to complete more of a squat pvt transfer. pt then used PT and tech to pull up on once in chair and was able to complete full standing, significant dependence on RW, max v/c's for deep, controlled breathing. pt stood x2 min. PT assisted to help achieve L foot flat on the ground, HR up in 170s-182bpm limiting further standing tolerance activities. RN present and aware  Ambulation/Gait             General Gait Details: not able today   Stairs             Wheelchair Mobility    Modified Rankin (Stroke Patients Only)       Balance Overall balance assessment: Needs assistance Sitting-balance support: No upper extremity supported;Feet supported Sitting balance-Leahy Scale: Good Sitting balance - Comments: close supervision for safety   Standing balance support: Bilateral upper extremity supported Standing balance-Leahy Scale: Poor Standing balance comment: dependent on RW                            Cognition Arousal/Alertness: Awake/alert Behavior During Therapy: WFL for tasks assessed/performed;Anxious Overall Cognitive Status: Within Functional Limits for tasks assessed  General Comments: pt high anxiety due to onset of pain in L calf/LE. pt emotional constantly stating "I'm so sorry".      Exercises General Exercises - Lower Extremity Ankle Circles/Pumps: AROM;Both;AAROM;10 reps;Seated (5) Gluteal Sets: AROM;Both;10 reps;Supine Long Arc Quad: AAROM;Left;5 reps;Seated (with 3 sec hold at the top)    General Comments General comments (skin integrity, edema, etc.): HR 140-180s with sitting/standing activities. Pt emotional, crying      Pertinent Vitals/Pain Pain Assessment:  Faces Faces Pain Scale: Hurts worst Pain Location: LLE Pain Descriptors / Indicators: Sharp Pain Intervention(s): Limited activity within patient's tolerance    Home Living                      Prior Function            PT Goals (current goals can now be found in the care plan section) Acute Rehab PT Goals Patient Stated Goal: to reduce pain and improve mobility Progress towards PT goals: Progressing toward goals    Frequency    Min 4X/week      PT Plan Current plan remains appropriate    Co-evaluation              AM-PAC PT "6 Clicks" Mobility   Outcome Measure  Help needed turning from your back to your side while in a flat bed without using bedrails?: A Little Help needed moving from lying on your back to sitting on the side of a flat bed without using bedrails?: A Lot Help needed moving to and from a bed to a chair (including a wheelchair)?: A Lot Help needed standing up from a chair using your arms (e.g., wheelchair or bedside chair)?: A Lot Help needed to walk in hospital room?: A Lot Help needed climbing 3-5 steps with a railing? : A Lot 6 Click Score: 13    End of Session Equipment Utilized During Treatment: Gait belt Activity Tolerance: Patient limited by pain Patient left: with call bell/phone within reach;with family/visitor present;in chair;with chair alarm set Nurse Communication: Mobility status PT Visit Diagnosis: Other abnormalities of gait and mobility (R26.89);Muscle weakness (generalized) (M62.81);Pain Pain - Right/Left: Left Pain - part of body: Leg     Time: 1856-3149 PT Time Calculation (min) (ACUTE ONLY): 35 min  Charges:  $Therapeutic Exercise: 8-22 mins $Therapeutic Activity: 8-22 mins                     Diane Todd, PT, DPT Acute Rehabilitation Services Pager #: [phone] Office #: [phone]    Diane Todd 06/15/2021, 10:11 AM

## 2021-06-15 NOTE — Progress Notes (Addendum)
  Progress Note    06/15/2021 1:29 PM 1 Day Post-Op  Subjective:  no complaints   Vitals:   06/15/21 0926 06/15/21 1010  BP:  102/70  Pulse: (!) 167 90  Resp:  20  Temp:  98.9 F (37.2 C)  SpO2:  100%   Physical Exam: Cardiac:  regular Lungs:  non labored Incisions:  left leg dressings clean, dry and intact Extremities:  well perfused and warm. Left leg edematous. Palpable DP pulse Neurologic: alert and oriented  CBC    Component Value Date/Time   WBC 8.9 06/15/2021 0122   RBC 3.36 (L) 06/15/2021 0122   HGB 9.4 (L) 06/15/2021 0122   HCT 31.7 (L) 06/15/2021 0122   PLT 327 06/15/2021 0122   MCV 94.3 06/15/2021 0122   MCH 28.0 06/15/2021 0122   MCHC 29.7 (L) 06/15/2021 0122   RDW 18.9 (H) 06/15/2021 0122   LYMPHSABS 1.8 06/11/2021 0158   MONOABS 1.2 (H) 06/11/2021 0158   EOSABS 0.1 06/11/2021 0158   BASOSABS 0.1 06/11/2021 0158    BMET    Component Value Date/Time   NA 142 06/15/2021 0122   K 4.0 06/15/2021 0122   CL 113 (H) 06/15/2021 0122   CO2 23 06/15/2021 0122   GLUCOSE 104 (H) 06/15/2021 0122   BUN 5 (L) 06/15/2021 0122   CREATININE 1.21 (H) 06/15/2021 0122   CALCIUM 8.6 (L) 06/15/2021 0122   GFRNONAA >60 06/15/2021 0122    INR No results found for: INR   Intake/Output Summary (Last 24 hours) at 06/15/2021 1329 Last data filed at 06/14/2021 1700 Gross per 24 hour  Intake 743.47 ml  Output --  Net 743.47 ml     Assessment/Plan:  20 y.o. female is s/p Ultrasound left popliteal vein, left popliteal venous access, intravascular ultrasound left popliteal superficial femoral common femoral external iliac common iliac inferior vena cava, left iliac and inferior venacavogram, angioplasty left common iliac vein, mechanical thrombectomy (Inari) inferior vena cava left common iliac left external iliac left common femoral left superficial femoral left popliteal vein 1 Day Post-Op   Left leg is well perfused with palpable pulse Will remove suture/ bolster  behind left knee tomorrow Pain Control PRN Transitioned off IV heparin to Eliquis Mobilize as tolerated Elevate Legs while seated or in bed Order placed for Thigh high compression stockings She will have outpatient follow up with [facility] in 1 month with IVC/ iliac/ LE vein duplex  DVT prophylaxis:  Eliquis   Corrina Theadora Rama, PA-C [facility] [phone] 06/15/2021 1:29 PM  I have interviewed the patient and examined the patient. I agree with the findings by the PA. To begin Eliquis. Doing well.   Cari Caraway, MD

## 2021-06-15 NOTE — Progress Notes (Signed)
ANTICOAGULATION CONSULT NOTE  Pharmacy Consult for Heparin Indication:  VTE  Labs: Recent Labs    06/12/21 0423 06/12/21 0647 06/13/21 0024 06/14/21 0104 06/14/21 0130 06/15/21 0122  HGB 9.8*  --   --   --  9.6* 9.4*  HCT 32.6*  --   --   --  31.4* 31.7*  PLT 304  --   --   --  351 327  HEPARINUNFRC  --    < > 0.61 0.35  --  0.87*  CREATININE 1.41*  --  1.39* 1.22*  --   --    < > = values in this interval not displayed.    Assessment: 20  yof with Bilateral DVT left leg more symptomatic than the right. Heparin per pharmacy ordered. No AC PTA.   Heparin level remains therapeutic at 0.35, on 1600 units/hr. Hgb 9.6, plt 351. No s/sx of bleeding or infusion issues.   7/30 AM update:  Heparin level elevated this AM Hgb low but stable  Goal of Therapy:  Heparin level 0.3-0.7 units/ml Monitor platelets by anticoagulation protocol: Yes   Plan:  Dec heparin to 1500 units/hr Re-check heparin level in 8 hours   Abran Duke, PharmD, BCPS Clinical Pharmacist Phone: [phone]

## 2021-06-16 LAB — BASIC METABOLIC PANEL
Anion gap: 8 (ref 5–15)
BUN: 5 mg/dL — ABNORMAL LOW (ref 6–20)
CO2: 20 mmol/L — ABNORMAL LOW (ref 22–32)
Calcium: 8.9 mg/dL (ref 8.9–10.3)
Chloride: 112 mmol/L — ABNORMAL HIGH (ref 98–111)
Creatinine, Ser: 1.12 mg/dL — ABNORMAL HIGH (ref 0.44–1.00)
GFR, Estimated: >60 mL/min (ref >60)
Glucose, Bld: 89 mg/dL (ref 70–99)
Potassium: 4.1 mmol/L (ref 3.5–5.1)
Sodium: 140 mmol/L (ref 135–145)

## 2021-06-16 LAB — CBC
HCT: 33.6 % — ABNORMAL LOW (ref 36.0–46.0)
Hemoglobin: 10.2 g/dL — ABNORMAL LOW (ref 12.0–15.0)
MCH: 28.4 pg (ref 26.0–34.0)
MCHC: 30.4 g/dL (ref 30.0–36.0)
MCV: 93.6 fL (ref 80.0–100.0)
Platelets: 270 10*3/uL (ref 150–400)
RBC: 3.59 MIL/uL — ABNORMAL LOW (ref 3.87–5.11)
RDW: 19.4 % — ABNORMAL HIGH (ref 11.5–15.5)
WBC: 6.7 10*3/uL (ref 4.0–10.5)
nRBC: 0 % (ref 0.0–0.2)

## 2021-06-16 MED ORDER — SORBITOL 70 % SOLN
30.0000 mL | Freq: Once | Status: DC
  Filled 2021-06-16: qty 30

## 2021-06-16 MED ORDER — HYDROCODONE-ACETAMINOPHEN 5-325 MG PO TABS: 1.0000 | ORAL_TABLET | ORAL | 0 refills | Status: DC | PRN

## 2021-06-16 MED ORDER — APIXABAN 5 MG PO TABS: 10.0000 mg | ORAL_TABLET | Freq: Two times a day (BID) | ORAL | 0 refills | Status: DC

## 2021-06-16 MED ORDER — THIAMINE HCL 100 MG PO TABS: 100.0000 mg | ORAL_TABLET | Freq: Every day | ORAL | 3 refills | Status: DC

## 2021-06-16 MED ORDER — BACITRACIN ZINC 500 UNIT/GM EX OINT: 1.0000 "application " | TOPICAL_OINTMENT | Freq: Two times a day (BID) | CUTANEOUS | 0 refills | Status: DC

## 2021-06-16 MED ORDER — APIXABAN 5 MG PO TABS: 5.0000 mg | ORAL_TABLET | Freq: Two times a day (BID) | ORAL | 0 refills | Status: DC

## 2021-06-16 NOTE — Progress Notes (Signed)
Pt being D/C, VSS, education provided,IV removed, equipment delievered.   [name], RN 06/16/2021 1:59 PM

## 2021-06-16 NOTE — Discharge Instructions (Addendum)
Information on my medicine - ELIQUIS (apixaban)  Why was Eliquis prescribed for you? Eliquis was prescribed to treat blood clots that may have been found in the veins of your legs (deep vein thrombosis) or in your lungs (pulmonary embolism) and to reduce the risk of them occurring again.  What do You need to know about Eliquis ? The starting dose is 10 mg (two 5 mg tablets) taken TWICE daily for the FIRST SEVEN (7) DAYS, then on 06/22/2021 the dose is reduced to ONE 5 mg tablet taken TWICE daily.  Eliquis may be taken with or without food.   Try to take the dose about the same time in the morning and in the evening. If you have difficulty swallowing the tablet whole please discuss with your pharmacist how to take the medication safely.  Take Eliquis exactly as prescribed and DO NOT stop taking Eliquis without talking to the doctor who prescribed the medication.  Stopping may increase your risk of developing a new blood clot.  Refill your prescription before you run out.  After discharge, you should have regular check-up appointments with your healthcare provider that is prescribing your Eliquis.    What do you do if you miss a dose? If a dose of ELIQUIS is not taken at the scheduled time, take it as soon as possible on the same day and twice-daily administration should be resumed. The dose should not be doubled to make up for a missed dose.  Important Safety Information A possible side effect of Eliquis is bleeding. You should call your healthcare provider right away if you experience any of the following: Bleeding from an injury or your nose that does not stop. Unusual colored urine (red or dark brown) or unusual colored stools (red or black). Unusual bruising for unknown reasons. A serious fall or if you hit your head (even if there is no bleeding).  Some medicines may interact with Eliquis and might increase your risk of bleeding or clotting while on Eliquis. To help avoid this,  consult your healthcare provider or pharmacist prior to using any new prescription or non-prescription medications, including herbals, vitamins, non-steroidal anti-inflammatory drugs (NSAIDs) and supplements.  This website has more information on Eliquis (apixaban): http://www.eliquis.com/eliquis/home

## 2021-06-16 NOTE — TOC Transition Note (Addendum)
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 7/19 s/p L LE thrombectomy.  PMH of NMO, HTN, hypothyroidism.  Per MD patient ready for DC today. RN, patient, and patient's family aware of DC. Pt states will have 24/7 assistance and supervision from mother and brother once d/c. NCM unable to secure home health agency for home health services. Pt agreeable to outpatient PT services. Referral made with [facility] after receiving the ok from d/c MD and noted on AVS. Pt requested DME W/C and 3IN 1 /BSC. Order noted for DME needs. Referral made with [facility], DME will be delivered to bedside prior to d/c. Pt states can afford Rx meds. Post hospit f/u noted on AVS Family to provide transportation to home.  RNCM will sign off for now as intervention is no longer needed. Please consult [place] again if new needs arise.    Final next level of care: Home/Self Care Barriers to Discharge: No Barriers Identified   Patient Goals and CMS Choice Patient states their goals for this hospitalization and ongoing recovery are:: To go home CMS Medicare.gov Compare Post Acute Care list provided to:: Patient Choice offered to / list presented to : Patient  Discharge Placement                       Discharge Plan and Services   Discharge Planning Services: CM Consult Post Acute Care Choice: Home Health          DME Arranged: 3-N-1, Wheelchair manual DME Agency: [facility] Date DME Agency Contacted: 06/16/21 Time DME Agency Contacted: 1122 Representative spoke with at DME Agency: Leavy Cella             Social Determinants of Health (SDOH) Interventions     Readmission Risk Interventions No flowsheet data found.

## 2021-06-16 NOTE — Progress Notes (Signed)
    Durable Medical Equipment  (From admission, onward)           Start     Ordered   06/16/21 1121  For home use only DME 3 n 1  Once        06/16/21 1120   06/16/21 1116  For home use only DME lightweight manual wheelchair with seat cushion  Once       Comments: Patient suffers from weakness, s/p iliac vein stenting on 06/14/2021   which impairs their ability to perform daily activities like bathing , bressingin the home.  A rolling walker will not resolve  issue with performing activities of daily living. A wheelchair will allow patient to safely perform daily activities. Patient is not able to propel themselves in the home using a standard weight wheelchair due to generalized weakness. Patient can self propel in the lightweight wheelchair. Length of need : 6 months.Accessories: elevating leg rests (ELRs), wheel locks, extensions and anti-tippers.   06/16/21 1120

## 2021-06-16 NOTE — Discharge Summary (Signed)
Physician Discharge Summary  Fraya Ueda ZOX:[id number] DOB: [birth year] DOA: 06/10/2021  PCP: Toma Deiters, MD  Admit date: 06/10/2021 Discharge date: 06/16/2021  Admitted From: Home Disposition:  Home  Recommendations for Outpatient Follow-up:  Follow up with PCP in 1-2 weeks Please obtain BMP/CBC in one week  Home Health:Yes Equipment/Devices:None  Discharge Condition:Stable CODE STATUS:Full Diet recommendation: Heart Healthy /  Brief/Interim Summary: 20 y.o. female past medical history essential hypertension hypothyroidism, neuromyelitis optica spectrum disorder comes in for lower extremity edema found to have extensive bilateral DVTs IV heparin was started vascular surgery and hematology were consulted.  Discharge Diagnoses:  Active Problems:   Hypothyroidism   History of hypertension   Neuromyelitis optica (devic) (HCC)   DVT (deep venous thrombosis) (HCC)   AKI (acute kidney injury) (HCC)  Acute bilateral lower extremity DVT: Hematology was consulted they believe the DVT is the due to long hospitalization. Hematology will follow-up as an outpatient on admission she was started on IV heparin vascular surgery was consulted due to the size of her DVT they recommended a thrombectomy which was done on 06/14/2021. She was transitioned to oral Eliquis which she will continue for at least 6 months she will follow-up with hematology as an outpatient.  Acute kidney injury: IV prerenal azotemia in the setting of hypotension this resolved with IV fluid hydration.  Hypovolemic hypotension: Resolved with IV fluid resuscitation.  Neuromyelitis optica spectrum disorder: Neurology was consulted recommended an MRI that showed marked interval improvement on her previous signal in the medial thalamus with relative residual FLAIR signaling.  She was given again high-dose IV thiamine and switch to oral thiamine which should continue as an outpatient.  Memory loss: Follow-up with  neurology as an outpatient there is a concern for Korsakoff encephalopathy which she will need to be evaluated as an outpatient.  Hypothyroidism continue Synthroid.  Essential hypertension: She was resumed on her propranolol.  Second-degree burn in the back: Accidental continue piercing cream.  Obesity: Counseling.  Discharge Instructions  Discharge Instructions     Diet - low sodium heart healthy   Complete by: As directed    Increase activity slowly   Complete by: As directed    No wound care   Complete by: As directed       Allergies as of 06/16/2021   No Known Allergies      Medication List     STOP taking these medications    multivitamin with minerals Tabs tablet       TAKE these medications    apixaban 5 MG Tabs tablet Commonly known as: ELIQUIS Take 2 tablets (10 mg total) by mouth 2 (two) times daily.   apixaban 5 MG Tabs tablet Commonly known as: ELIQUIS Take 1 tablet (5 mg total) by mouth 2 (two) times daily. Start taking on: June 22, 2021   bacitracin ointment Apply 1 application topically 2 (two) times daily.   HYDROcodone-acetaminophen 5-325 MG tablet Commonly known as: NORCO/VICODIN Take 1-2 tablets by mouth every 4 (four) hours as needed for moderate pain.   levothyroxine 100 MCG tablet Commonly known as: SYNTHROID Take 1 tablet (100 mcg total) by mouth daily.   lidocaine 5 % Commonly known as: LIDODERM Place 2 patches onto the skin daily. Remove & Discard patch within 12 hours or as directed by MD   melatonin 3 MG Tabs tablet Take 1 tablet (3 mg total) by mouth at bedtime.   propranolol 10 MG tablet Commonly known as: INDERAL Take 1 tablet (10 mg  total) by mouth daily.   thiamine 100 MG tablet Take 1 tablet (100 mg total) by mouth daily.   traMADol 50 MG tablet Commonly known as: ULTRAM Take 1 tablet (50 mg total) by mouth every 6 (six) hours as needed for severe pain.        Follow-up Information     [facility] Follow up.   Contact information: [address] [place] [address] [phone]               No Known Allergies  Consultations: Vascular surgery Hematology   Procedures/Studies: MR BRAIN WO CONTRAST  Result Date: 06/11/2021 CLINICAL DATA:  Initial evaluation for memory loss. Recent abnormal brain MRI with concern for NMOSD or possibly Wernicke encephalopathy. EXAM: MRI HEAD WITHOUT CONTRAST TECHNIQUE: Multiplanar, multiecho pulse sequences of the brain and surrounding structures were obtained without intravenous contrast. COMPARISON:  Comparison made with previous MRIs from 05/05/2021 and 05/04/2021. FINDINGS: Brain: Cerebral volume stable, and remains within normal limits for age. No abnormal foci of restricted diffusion to suggest acute or subacute ischemia. No encephalomalacia to suggest chronic cortical infarction. No acute intracranial hemorrhage. Previously seen signal changes involving the medial thalami, periaqueductal gray matter, and vestibular nuclei is markedly improved as compared to previous exam. Relatively subtle residual FLAIR signal abnormality now seen involving the medial thalami (series 7, image 17) and periaqueductal gray matter (series 7, image 14). No significant mass effect. Now seen is associated susceptibility artifact at the medial thalami, new from previous, and likely related to prior inflammation and/or treatment (series 8, image 51). No other new parenchymal signal abnormality seen elsewhere within the brain. No mass lesion, mass effect, or midline shift. Ventricles normal size without hydrocephalus. Pituitary gland and suprasellar region within normal limits. Midline structures intact and normal. Vascular: Major intracranial vascular flow voids are well maintained. Skull and upper cervical spine: Craniocervical junction within normal limits. Visualized upper cervical spine unremarkable. Somewhat diffusely decreased T1 signal  intensity seen throughout the visualized bone marrow, likely related to anemia given patient's recent laboratory results. No focal marrow replacing lesion. No scalp soft tissue abnormality. Sinuses/Orbits: Globes and orbital soft tissues demonstrate no acute finding. Left maxillary sinus retention cyst noted. Mild mucosal thickening noted within the underlying maxillary sinuses bilaterally. Trace bilateral mastoid effusions noted, of doubtful significance. Visualized nasopharynx unremarkable. Inner ear structures grossly within normal limits. Other: None. IMPRESSION: 1. Marked interval improvement in previously seen signal changes involving the medial thalami, periaqueductal gray matter, and vestibular nuclei, with relatively subtle residual FLAIR signal abnormality now seen. Associated susceptibility artifact at the medial thalami is new, and likely related to prior inflammation and/or treatment. 2. Otherwise stable and negative brain MRI for age. No other acute intracranial abnormality identified. Electronically Signed   By: Rise Mu M.D.   On: 06/11/2021 21:00   NM Pulmonary Perfusion  Result Date: 06/11/2021 CLINICAL DATA:  PE suspected, high prob. DVT. Shortness of breath. Chest pain. EXAM: NUCLEAR MEDICINE PERFUSION LUNG SCAN TECHNIQUE: Perfusion images were obtained in multiple projections after intravenous injection of radiopharmaceutical. Ventilation scans intentionally deferred if perfusion scan and chest x-ray adequate for interpretation during COVID 19 epidemic. RADIOPHARMACEUTICALS:  4.2 mCi Tc-76m MAA IV COMPARISON:  Chest x-ray 06/11/2021 FINDINGS: Homogeneous distribution of radiotracer throughout both lung fields. No perfusion defects. IMPRESSION: Normal nuclear medicine perfusion lung scan. No evidence of pulmonary embolism. Electronically Signed   By: Duanne Guess D.O.   On: 06/11/2021 11:57   US Abdomen Complete  Result Date: 06/11/2021 CLINICAL DATA:  Left leg DVT, rule  out IVC thrombosis EXAM: ABDOMEN ULTRASOUND COMPLETE COMPARISON:  Renal ultrasound 05/04/2021, CT 03/14/2021 FINDINGS: Gallbladder: No gallstones or wall thickening visualized. No sonographic Murphy sign noted by sonographer. Common bile duct: Diameter: 3.1 mm, nondilated Liver: No focal lesion identified. Within normal limits in parenchymal echogenicity. Portal vein is patent on color Doppler imaging with normal direction of blood flow towards the liver. IVC: Portions of the IVC are suboptimally visualized due to overlying bowel gas. Visible segments are free of discernible thrombus. Pancreas: Not visualized due to extensive bowel gas. Spleen: Size and appearance within normal limits. Right Kidney: Length: 9.3 cm. Echogenicity is within normal limits. No concerning renal mass. Few small echogenic foci with posterior shadowing could reflect nonobstructing uroliths. No hydronephrosis. Left Kidney: Length: 7.9 cm. Echogenicity is within normal limits. No concerning renal mass, shadowing calculus or hydronephrosis. Abdominal aorta: No aneurysm visualized in the visible segments of abdominal aorta. Other findings: None. IMPRESSION: Technically challenging exam due to patient body habitus and extensive bowel gas limiting visualization of portions of the abdomen including portions of the IVC. The visible segments of the inferior vena cava appear widely patent however. Few echogenic foci with posterior shadowing in the right kidney, could reflect nonobstructing urolithiasis. No hydronephrosis. Otherwise unremarkable abdominal ultrasound. Electronically Signed   By: Kreg Shropshire M.D.   On: 06/11/2021 00:49   PERIPHERAL VASCULAR CATHETERIZATION  Result Date: 06/14/2021 Procedure: Ultrasound left popliteal vein, left popliteal venous access, intravascular ultrasound left popliteal superficial femoral common femoral external iliac common iliac inferior vena cava, left iliac and inferior venacavogram, angioplasty left  common iliac vein, mechanical thrombectomy (Inari) inferior vena cava left common iliac left external iliac left common femoral left superficial femoral left popliteal vein Preoperative diagnosis: Extensive DVT left lower extremity Postoperative diagnosis: Same Anesthesia: Local with IV sedation 89 minutes sedation time Operative findings: 1.  Large amount of thrombus extending from the distal inferior vena cava all the way to the popliteal vein including the profunda vein 2.  Mild compression/narrowing left common iliac vein angioplastied to 10 mm Operative details: After obtaining informed consent, the patient taken to the PV lab.  The patient was placed in supine position angio table.  Patient was placed then in prone position.  The posterior aspect of the left popliteal was prepped and draped in usual sterile fashion.  Ultrasound was used to identify the left popliteal vein.  A small nick was made in the skin.  A micropuncture needle was then used to cannulate the left popliteal vein and a micropuncture wire advanced into this.  This was confirmed under fluoroscopy.  Micropuncture sheath was then placed over the microwire.  Patient was given a total of 13,000 units of intravenous heparin.  Swapped out for an 035 Glidewire advantage which was advanced all the way up to the level of the left subclavian vein.  We confirmed we were in the venous circulation with a gently injected contrast angiogram.  There was a large amount of thrombus within the popliteal and superficial femoral vein.  At this point the micropuncture sheath was swapped out for a 9 [place] sheath.  The IVUS catheter was then placed through the 9 French sheath up into the inferior vena cava.  This was used to examine the area of thrombus.  There was thrombus right at the confluence of the iliac veins in the distal inferior vena cava and extending all the way down into the popliteal vein  on the left side.  At this point the Inari device was brought up  in the operative field.  The existing 9 French sheath was swapped out over the wire for a Inari sheath.  I then made 4 discrete passes with the NRA sheath from the distal inferior vena cava common iliac external iliac common femoral vein superficial femoral and popliteal veins.  A large amount of thrombus was retrieved.  On her fifth pass we had fairly minimal thrombus burden.  I then placed the IVUS catheter back up and about 90% of the thrombus had been cleared from the caval and iliac segments.  There still was some residual thrombus in the popliteal vein.  At this point IVUS was used to make measurements of the left common iliac vein.  There was some compression.  However the Caver was also fairly flattened suggestive of some dehydration level.  We also noted on completion venogram that there was not much flow coming from the profunda vein and I was concerned that the stent would not have adequate inflow to maintain patency.  Therefore we decided to angioplasty the left common iliac vein.  A 10 x 40 angioplasty balloon was advanced and centered at the level of the caval iliac junction.  This was then inflated to nominal pressure for 2 overlapping inflations.  Completion angiogram showed no evidence of extravasation and a patent left common iliac system.  There was a small amount of waist on the balloon when inflated to nominal pressure.  At this point the guidewire was removed the sheath was removed and the insertion site closed with a Monocryl stitch.  The patient tolerated procedure well and there were no complications.  The instrument sponge and needle counts correct the end of the case.  The patient was taken to the holding area in stable condition. Operative management: Patient can be started on oral anticoagulation at this point and transition off of IV heparin.  She should be maintained on continuous anticoagulation with as she will be high risk for rethrombosis in the early.  She should be maintained on  oral anticoagulation for at least 6 months and then reevaluate.  If she were to reocclude her entire leg again consideration would be given for placing a left common iliac stent.  I would only consider this if she has adequate profunda flow though to maintain stent flow and patency. Fabienne Bruns, MD [facility] Office: [phone]   DG CHEST PORT 1 VIEW  Result Date: 06/11/2021 CLINICAL DATA:  Tachycardia, DVT EXAM: PORTABLE CHEST 1 VIEW COMPARISON:  None. FINDINGS: No consolidation, features of edema, pneumothorax, or effusion. Pulmonary vascularity is normally distributed. The cardiomediastinal contours are unremarkable. No acute osseous or soft tissue abnormality. IMPRESSION: No acute cardiopulmonary abnormality. Electronically Signed   By: Kreg Shropshire M.D.   On: 06/11/2021 00:49   ECHOCARDIOGRAM COMPLETE  Result Date: 06/11/2021    ECHOCARDIOGRAM REPORT   Patient Name:   ODESTER NILSON Date of Exam: 06/11/2021 Medical Rec #:  [id number]           Height:       67.0 in Accession #:    [id number]          Weight:       208.8 lb Date of Birth:  Mar 19, 2001           BSA:          2.059 m Patient Age:    20 years  BP:           94/53 mmHg Patient Gender: F                   HR:           93 bpm. Exam Location:  Inpatient Procedure: 2D Echo, Cardiac Doppler and Color Doppler Indications:    Dyspnea R06.00  History:        Patient has no prior history of Echocardiogram examinations.                 Risk Factors:Hypertension.  Sonographer:    Eulah Pont RDCS Referring Phys: Consepcion Hearing ANASTASSIA DOUTOVA IMPRESSIONS  1. Left ventricular ejection fraction, by estimation, is 50 to 55%. The left ventricle has low normal function. The left ventricle demonstrates regional wall motion abnormalities (see scoring diagram/findings for description). Left ventricular diastolic  parameters were normal.  2. Right ventricular systolic function is normal. The right ventricular size  is normal.  3. The mitral valve is normal in structure. No evidence of mitral valve regurgitation. No evidence of mitral stenosis.  4. The aortic valve is tricuspid. Aortic valve regurgitation is not visualized. No aortic stenosis is present.  5. The inferior vena cava is normal in size with greater than 50% respiratory variability, suggesting right atrial pressure of 3 mmHg. FINDINGS  Left Ventricle: Left ventricular ejection fraction, by estimation, is 50 to 55%. The left ventricle has low normal function. The left ventricle demonstrates regional wall motion abnormalities. The left ventricular internal cavity size was normal in size. There is no left ventricular hypertrophy. Left ventricular diastolic parameters were normal. Normal left ventricular filling pressure. Right Ventricle: The right ventricular size is normal. No increase in right ventricular wall thickness. Right ventricular systolic function is normal. Left Atrium: Left atrial size was normal in size. Right Atrium: Right atrial size was normal in size. Pericardium: There is no evidence of pericardial effusion. Mitral Valve: The mitral valve is normal in structure. No evidence of mitral valve regurgitation. No evidence of mitral valve stenosis. Tricuspid Valve: The tricuspid valve is normal in structure. Tricuspid valve regurgitation is trivial. No evidence of tricuspid stenosis. Aortic Valve: The aortic valve is tricuspid. Aortic valve regurgitation is not visualized. No aortic stenosis is present. Pulmonic Valve: The pulmonic valve was normal in structure. Pulmonic valve regurgitation is not visualized. No evidence of pulmonic stenosis. Aorta: The aortic root is normal in size and structure. Venous: The inferior vena cava is normal in size with greater than 50% respiratory variability, suggesting right atrial pressure of 3 mmHg. IAS/Shunts: No atrial level shunt detected by color flow Doppler.  LEFT VENTRICLE PLAX 2D LVIDd:         4.80 cm  Diastology  LVIDs:         3.50 cm  LV e' medial:    11.90 cm/s LV PW:         0.90 cm  LV E/e' medial:  4.9 LV IVS:        0.70 cm  LV e' lateral:   12.20 cm/s LVOT diam:     1.90 cm  LV E/e' lateral: 4.7 LV SV:         46 LV SV Index:   23 LVOT Area:     2.84 cm  RIGHT VENTRICLE RV S prime:     11.30 cm/s TAPSE (M-mode): 1.8 cm LEFT ATRIUM             Index  RIGHT ATRIUM          Index LA diam:        3.40 cm 1.65 cm/m  RA Area:     8.89 cm LA Vol (A2C):   25.6 ml 12.43 ml/m RA Volume:   14.80 ml 7.19 ml/m LA Vol (A4C):   21.0 ml 10.20 ml/m LA Biplane Vol: 25.5 ml 12.38 ml/m  AORTIC VALVE LVOT Vmax:   109.00 cm/s LVOT Vmean:  69.800 cm/s LVOT VTI:    0.164 m  AORTA Ao Root diam: 2.70 cm Ao Asc diam:  2.80 cm MITRAL VALVE               TRICUSPID VALVE MV Area (PHT): 4.89 cm    TR Peak grad:   10.6 mmHg MV Decel Time: 155 msec    TR Vmax:        163.00 cm/s MV E velocity: 57.90 cm/s MV A velocity: 53.30 cm/s  SHUNTS MV E/A ratio:  1.09        Systemic VTI:  0.16 m                            Systemic Diam: 1.90 cm Chilton Siiffany Marianna MD Electronically signed by Chilton Siiffany  MD Signature Date/Time: 06/11/2021/12:27:43 PM    Final    VAS US LOWER EXTREMITY VENOUS (DVT)  Result Date: 06/10/2021  Lower Venous DVT Study Patient Name:  Evaristo BuryODALYS OLGUIN-TREJO  Date of Exam:   06/10/2021 Medical Rec #: [id number]            Accession #:    [id number] Date of Birth: 11-27-2000            Patient Gender: F Patient Age:   20Y [facility]am [facility]  [facility] Procedure:      VAS US LOWER EXTREMITY VENOUS (DVT) Referring Phys: [id number] Marianna FussEUNICE L THOMAS --------------------------------------------------------------------------------  Indications: DVT, Pain, and Swelling.  Risk Factors: Recently discharged from hospital on 01/06/21. Performing Technologist: Thereasa ParkinHelene Cestone RVT  Examination Guidelines: A complete evaluation includes B-mode imaging, spectral Doppler, color Doppler, and power Doppler as needed of all  accessible portions of each vessel. Bilateral testing is considered an integral part of a complete examination. Limited examinations for reoccurring indications may be performed as noted. The reflux portion of the exam is performed with the patient in reverse Trendelenburg.  +---------+---------------+---------+-----------+----------+--------------+ LEFT     CompressibilityPhasicitySpontaneityPropertiesThrombus Aging +---------+---------------+---------+-----------+----------+--------------+ CFV      None           No       No                                  +---------+---------------+---------+-----------+----------+--------------+ SFJ      None                    No                                  +---------+---------------+---------+-----------+----------+--------------+ FV Prox  None           No       No                                  +---------+---------------+---------+-----------+----------+--------------+ FV Mid  None           No       No                                  +---------+---------------+---------+-----------+----------+--------------+ FV DistalNone           No       No                                  +---------+---------------+---------+-----------+----------+--------------+ PFV      Partial                 Yes                                 +---------+---------------+---------+-----------+----------+--------------+ POP      None           No       No                                  +---------+---------------+---------+-----------+----------+--------------+ PTV      Partial        No       Yes                                 +---------+---------------+---------+-----------+----------+--------------+ PERO     Partial        No       Yes                                 +---------+---------------+---------+-----------+----------+--------------+ Gastroc  None                    No                                   +---------+---------------+---------+-----------+----------+--------------+ GSV      Full           Yes      Yes                                 +---------+---------------+---------+-----------+----------+--------------+ SSV      Full                    Yes                                 +---------+---------------+---------+-----------+----------+--------------+ Occlusive thrombus in the left EIV and CIV. Flow seen in the IVC but cannot rule out partial thrombus as it was not well visualized. The right CIV and EIV appear widely patent.  Findings reported to Dr. Carlis Abbott at 3:45 pm. Patient instructed to go to [facility] Emergency Dept.  Summary: RIGHT: - Findings consistent with acute deep vein thrombosis involving the right common femoral vein, SF junction, right femoral vein, right proximal profunda vein, right popliteal vein, right posterior tibial veins, right peroneal veins, and right gastrocnemius veins. Thrombus  in the left external iliac vein and common iliac vein. IVC not well visualized. Cannot rule out partial IVC thrombus.   *See table(s) above for measurements and observations. Electronically signed by Coral Else MD on 06/10/2021 at 6:48:55 PM.    Final    VAS [place] LOWER EXTREMITY VENOUS (DVT)  Result Date: 06/03/2021  Lower Venous DVT Study Patient Name:  MISBAH HORNADAY  Date of Exam:   06/03/2021 Medical Rec #: [id number]            Accession #:    [id number] Date of Birth: Aug 08, 2001            Patient Gender: F Patient Age:   20Y Exam Location:  [facility] Procedure:      VAS [place] LOWER EXTREMITY VENOUS (DVT) Referring Phys: [id number] Earna Coder T YIRSWN --------------------------------------------------------------------------------  Indications: Swelling.  Risk Factors: None identified. Comparison Study: No prior studies. Performing Technologist: Chanda Busing RVT  Examination Guidelines: A complete evaluation includes B-mode imaging, spectral Doppler, color Doppler, and  power Doppler as needed of all accessible portions of each vessel. Bilateral testing is considered an integral part of a complete examination. Limited examinations for reoccurring indications may be performed as noted. The reflux portion of the exam is performed with the patient in reverse Trendelenburg.  +---------+---------------+---------+-----------+----------+--------------+ RIGHT    CompressibilityPhasicitySpontaneityPropertiesThrombus Aging +---------+---------------+---------+-----------+----------+--------------+ CFV      Full           Yes      Yes                                 +---------+---------------+---------+-----------+----------+--------------+ SFJ      Full                                                        +---------+---------------+---------+-----------+----------+--------------+ FV Prox  Full                                                        +---------+---------------+---------+-----------+----------+--------------+ FV Mid   Full                                                        +---------+---------------+---------+-----------+----------+--------------+ FV DistalFull                                                        +---------+---------------+---------+-----------+----------+--------------+ PFV      Full                                                        +---------+---------------+---------+-----------+----------+--------------+ POP  Full           Yes      Yes                                 +---------+---------------+---------+-----------+----------+--------------+ PTV      Full                                                        +---------+---------------+---------+-----------+----------+--------------+ PERO     Full                                                        +---------+---------------+---------+-----------+----------+--------------+    +---------+---------------+---------+-----------+----------+--------------+ LEFT     CompressibilityPhasicitySpontaneityPropertiesThrombus Aging +---------+---------------+---------+-----------+----------+--------------+ CFV      Full           Yes      Yes                                 +---------+---------------+---------+-----------+----------+--------------+ SFJ      Full                                                        +---------+---------------+---------+-----------+----------+--------------+ FV Prox  Full                                                        +---------+---------------+---------+-----------+----------+--------------+ FV Mid   Full                                                        +---------+---------------+---------+-----------+----------+--------------+ FV DistalFull                                                        +---------+---------------+---------+-----------+----------+--------------+ PFV      Full                                                        +---------+---------------+---------+-----------+----------+--------------+ POP      Full           Yes      Yes                                 +---------+---------------+---------+-----------+----------+--------------+  PTV      Full                                                        +---------+---------------+---------+-----------+----------+--------------+ PERO     Full                                                        +---------+---------------+---------+-----------+----------+--------------+     Summary: RIGHT: - There is no evidence of deep vein thrombosis in the lower extremity.  - No cystic structure found in the popliteal fossa.  LEFT: - There is no evidence of deep vein thrombosis in the lower extremity.  - No cystic structure found in the popliteal fossa.  *See table(s) above for measurements and observations. Electronically signed  by Fabienne Bruns MD on 06/03/2021 at 7:52:25 PM.    Final    (Echo, Carotid, EGD, Colonoscopy, ERCP)    Subjective: Feels great tolerating her diet no complaints  Discharge Exam: Vitals:   06/16/21 0007 06/16/21 0409  BP: 111/73 (!) 101/56  Pulse: 92 100  Resp: 20 19  Temp: 98.4 F (36.9 C) 98 F (36.7 C)  SpO2: 100% 100%   Vitals:   06/15/21 1637 06/15/21 2011 06/16/21 0007 06/16/21 0409  BP: 116/68 113/60 111/73 (!) 101/56  Pulse: 85 80 92 100  Resp: 20 18 20 19   Temp: 98.3 F (36.8 C) 98.8 F (37.1 C) 98.4 F (36.9 C) 98 F (36.7 C)  TempSrc: Oral Oral Oral Oral  SpO2:  100% 100% 100%  Weight:      Height:        General: Pt is alert, awake, not in acute distress Cardiovascular: RRR, S1/S2 +, no rubs, no gallops Respiratory: CTA bilaterally, no wheezing, no rhonchi Abdominal: Soft, NT, ND, bowel sounds + Extremities: no edema, no cyanosis    The results of significant diagnostics from this hospitalization (including imaging, microbiology, ancillary and laboratory) are listed below for reference.     Microbiology: Recent Results (from the past 240 hour(s))  Resp Panel by RT-PCR (Flu A&B, Covid) Nasopharyngeal Swab     Status: None   Collection Time: 06/10/21 10:46 PM   Specimen: Nasopharyngeal Swab; Nasopharyngeal(NP) swabs in vial transport medium  Result Value Ref Range Status   SARS Coronavirus 2 by RT PCR NEGATIVE NEGATIVE Final    Comment: (NOTE) SARS-CoV-2 target nucleic acids are NOT DETECTED.  The SARS-CoV-2 RNA is generally detectable in upper respiratory specimens during the acute phase of infection. The lowest concentration of SARS-CoV-2 viral copies this assay can detect is 138 copies/mL. A negative result does not preclude SARS-Cov-2 infection and should not be used as the sole basis for treatment or other patient management decisions. A negative result may occur with  improper specimen collection/handling, submission of specimen other than  nasopharyngeal swab, presence of viral mutation(s) within the areas targeted by this assay, and inadequate number of viral copies(<138 copies/mL). A negative result must be combined with clinical observations, patient history, and epidemiological information. The expected result is Negative.  Fact Sheet for Patients:  BloggerCourse.com  Fact Sheet for Healthcare Providers:  SeriousBroker.it  This test is no t  yet approved or cleared by the [place] and  has been authorized for detection and/or diagnosis of SARS-CoV-2 by FDA under an Emergency Use Authorization (EUA). This EUA will remain  in effect (meaning this test can be used) for the duration of the COVID-19 declaration under Section 564(b)(1) of the Act, 21 U.S.C.section 360bbb-3(b)(1), unless the authorization is terminated  or revoked sooner.       Influenza A by PCR NEGATIVE NEGATIVE Final   Influenza B by PCR NEGATIVE NEGATIVE Final    Comment: (NOTE) The Xpert Xpress SARS-CoV-2/FLU/RSV plus assay is intended as an aid in the diagnosis of influenza from Nasopharyngeal swab specimens and should not be used as a sole basis for treatment. Nasal washings and aspirates are unacceptable for Xpert Xpress SARS-CoV-2/FLU/RSV testing.  Fact Sheet for Patients: BloggerCourse.com  Fact Sheet for Healthcare Providers: SeriousBroker.it  This test is not yet approved or cleared by the [place] FDA and has been authorized for detection and/or diagnosis of SARS-CoV-2 by FDA under an Emergency Use Authorization (EUA). This EUA will remain in effect (meaning this test can be used) for the duration of the COVID-19 declaration under Section 564(b)(1) of the Act, 21 U.S.C. section 360bbb-3(b)(1), unless the authorization is terminated or revoked.  Performed at [facility], [address], [place], [place] [address]    Nasopharyngeal Culture     Status: None   Collection Time: 06/13/21  7:57 PM   Specimen: Nasopharyngeal Swab  Result Value Ref Range Status   Specimen Description NASOPHARYNGEAL SWAB  Final   Special Requests NONE  Final   Culture   Final    NO MRSA DETECTED Performed at [facility], [address] [place], [place] [address]    Report Status 06/15/2021 FINAL  Final     Labs: BNP (last 3 results) Recent Labs    06/10/21 2222  BNP 13.9   Basic Metabolic Panel: Recent Labs  Lab 06/11/21 0158 06/11/21 1510 06/12/21 0423 06/13/21 0024 06/14/21 0104 06/15/21 0122 06/16/21 0217  NA 137   < > 142 142 141 142 140  K 4.6   < > 4.2 4.2 3.3* 4.0 4.1  CL 104   < > 114* 116* 112* 113* 112*  CO2 22   < > 19* 23 21* 23 20*  GLUCOSE 109*   < > 80 93 105* 104* 89  BUN 29*   < > 18 12 5* 5* 5*  CREATININE 2.25*   < > 1.41* 1.39* 1.22* 1.21* 1.12*  CALCIUM 8.8*   < > 8.3* 8.6* 8.5* 8.6* 8.9  MG 2.6*  --   --   --   --   --   --   PHOS 4.7*  --   --   --   --   --   --    < > = values in this interval not displayed.   Liver Function Tests: Recent Labs  Lab 06/10/21 1812 06/11/21 0158  AST 50* 47*  ALT 26 25  ALKPHOS 81 64  BILITOT 0.8 0.8  PROT 7.8 6.6  ALBUMIN 3.2* 2.9*   No results for input(s): LIPASE, AMYLASE in the last 168 hours. No results for input(s): AMMONIA in the last 168 hours. CBC: Recent Labs  Lab 06/11/21 0158 06/11/21 1728 06/12/21 0423 06/14/21 0130 06/15/21 0122 06/16/21 0217  WBC 13.5* 8.9 8.4 7.2 8.9 6.7  NEUTROABS 10.1*  --   --   --   --   --  HGB 12.1 10.2* 9.8* 9.6* 9.4* 10.2*  HCT 40.7 35.0* 32.6* 31.4* 31.7* 33.6*  MCV 93.6 97.0 94.5 94.3 94.3 93.6  PLT 359 316 304 351 327 270   Cardiac Enzymes: Recent Labs  Lab 06/11/21 0157  CKTOTAL 1,531*   BNP: Invalid input(s): POCBNP CBG: No results for input(s): GLUCAP in the last 168 hours. D-Dimer No results for input(s): DDIMER in the last 72 hours. Hgb A1c No results for  input(s): HGBA1C in the last 72 hours. Lipid Profile No results for input(s): CHOL, HDL, LDLCALC, TRIG, CHOLHDL, LDLDIRECT in the last 72 hours. Thyroid function studies No results for input(s): TSH, T4TOTAL, T3FREE, THYROIDAB in the last 72 hours.  Invalid input(s): FREET3 Anemia work up No results for input(s): VITAMINB12, FOLATE, FERRITIN, TIBC, IRON, RETICCTPCT in the last 72 hours. Urinalysis    Component Value Date/Time   COLORURINE YELLOW 06/11/2021 1733   APPEARANCEUR HAZY (A) 06/11/2021 1733   LABSPEC 1.016 06/11/2021 1733   PHURINE 5.0 06/11/2021 1733   GLUCOSEU NEGATIVE 06/11/2021 1733   HGBUR SMALL (A) 06/11/2021 1733   BILIRUBINUR NEGATIVE 06/11/2021 1733   KETONESUR NEGATIVE 06/11/2021 1733   PROTEINUR 30 (A) 06/11/2021 1733   NITRITE NEGATIVE 06/11/2021 1733   LEUKOCYTESUR NEGATIVE 06/11/2021 1733   Sepsis Labs Invalid input(s): PROCALCITONIN,  WBC,  LACTICIDVEN Microbiology Recent Results (from the past 240 hour(s))  Resp Panel by RT-PCR (Flu A&B, Covid) Nasopharyngeal Swab     Status: None   Collection Time: 06/10/21 10:46 PM   Specimen: Nasopharyngeal Swab; Nasopharyngeal(NP) swabs in vial transport medium  Result Value Ref Range Status   SARS Coronavirus 2 by RT PCR NEGATIVE NEGATIVE Final    Comment: (NOTE) SARS-CoV-2 target nucleic acids are NOT DETECTED.  The SARS-CoV-2 RNA is generally detectable in upper respiratory specimens during the acute phase of infection. The lowest concentration of SARS-CoV-2 viral copies this assay can detect is 138 copies/mL. A negative result does not preclude SARS-Cov-2 infection and should not be used as the sole basis for treatment or other patient management decisions. A negative result may occur with  improper specimen collection/handling, submission of specimen other than nasopharyngeal swab, presence of viral mutation(s) within the areas targeted by this assay, and inadequate number of viral copies(<138  copies/mL). A negative result must be combined with clinical observations, patient history, and epidemiological information. The expected result is Negative.  Fact Sheet for Patients:  BloggerCourse.com  Fact Sheet for Healthcare Providers:  SeriousBroker.it  This test is no t yet approved or cleared by the [place] FDA and  has been authorized for detection and/or diagnosis of SARS-CoV-2 by FDA under an Emergency Use Authorization (EUA). This EUA will remain  in effect (meaning this test can be used) for the duration of the COVID-19 declaration under Section 564(b)(1) of the Act, 21 U.S.C.section 360bbb-3(b)(1), unless the authorization is terminated  or revoked sooner.       Influenza A by PCR NEGATIVE NEGATIVE Final   Influenza B by PCR NEGATIVE NEGATIVE Final    Comment: (NOTE) The Xpert Xpress SARS-CoV-2/FLU/RSV plus assay is intended as an aid in the diagnosis of influenza from Nasopharyngeal swab specimens and should not be used as a sole basis for treatment. Nasal washings and aspirates are unacceptable for Xpert Xpress SARS-CoV-2/FLU/RSV testing.  Fact Sheet for Patients: BloggerCourse.com  Fact Sheet for Healthcare Providers: SeriousBroker.it  This test is not yet approved or cleared by the [place] FDA and has been authorized for detection and/or diagnosis of  SARS-CoV-2 by FDA under an Emergency Use Authorization (EUA). This EUA will remain in effect (meaning this test can be used) for the duration of the COVID-19 declaration under Section 564(b)(1) of the Act, 21 U.S.C. section 360bbb-3(b)(1), unless the authorization is terminated or revoked.  Performed at [facility], [address], [place], [place] [address]   Nasopharyngeal Culture     Status: None   Collection Time: 06/13/21  7:57 PM   Specimen: Nasopharyngeal Swab  Result Value Ref  Range Status   Specimen Description NASOPHARYNGEAL SWAB  Final   Special Requests NONE  Final   Culture   Final    NO MRSA DETECTED Performed at [facility], [address] [place], [place] [address]    Report Status 06/15/2021 FINAL  Final    SIGNED:   Marinda Elk, MD  [facility] 06/16/2021, 8:39 AM Pager   If 7PM-7AM, please contact night-coverage [email or web address] Password [email or web address]

## 2021-06-19 MED FILL — Lidocaine HCl Local Preservative Free (PF) Inj 1%: INTRAMUSCULAR | Qty: 30 | Status: AC

## 2021-06-27 ENCOUNTER — Other Ambulatory Visit: Payer: Self-pay

## 2021-06-27 ENCOUNTER — Encounter (HOSPITAL_COMMUNITY): Payer: Self-pay | Admitting: Speech Pathology

## 2021-06-27 ENCOUNTER — Ambulatory Visit (HOSPITAL_COMMUNITY): Payer: Medicaid Other

## 2021-06-27 ENCOUNTER — Encounter (HOSPITAL_COMMUNITY): Payer: Self-pay

## 2021-06-27 ENCOUNTER — Ambulatory Visit (HOSPITAL_COMMUNITY): Payer: Medicaid Other | Admitting: Speech Pathology

## 2021-06-27 ENCOUNTER — Ambulatory Visit (HOSPITAL_COMMUNITY): Payer: Medicaid Other | Attending: Physician Assistant

## 2021-06-27 DIAGNOSIS — R29898 Other symptoms and signs involving the musculoskeletal system: Secondary | ICD-10-CM | POA: Diagnosis present

## 2021-06-27 DIAGNOSIS — R41841 Cognitive communication deficit: Secondary | ICD-10-CM | POA: Diagnosis present

## 2021-06-27 DIAGNOSIS — R2681 Unsteadiness on feet: Secondary | ICD-10-CM | POA: Diagnosis present

## 2021-06-27 DIAGNOSIS — R278 Other lack of coordination: Secondary | ICD-10-CM | POA: Insufficient documentation

## 2021-06-27 DIAGNOSIS — M6281 Muscle weakness (generalized): Secondary | ICD-10-CM | POA: Diagnosis present

## 2021-06-27 DIAGNOSIS — R262 Difficulty in walking, not elsewhere classified: Secondary | ICD-10-CM

## 2021-06-27 DIAGNOSIS — R2689 Other abnormalities of gait and mobility: Secondary | ICD-10-CM | POA: Diagnosis present

## 2021-06-27 NOTE — Therapy (Signed)
[facility] [address] [place], [place], [address] Phone: [phone]   Fax:  [phone]  Speech Language Pathology Evaluation  Patient Details  Name: Diane Todd MRN: [id number] Date of Birth: [birth year] Referring Provider (SLP): Mariam Dollar, [place]   Encounter Date: 06/27/2021   End of Session - 06/27/21 1633     Visit Number 1    Number of Visits 5    Date for SLP Re-Evaluation 08/01/21    Authorization Type [employer]    SLP Start Time 1130    SLP Stop Time  1230    SLP Time Calculation (min) 60 min    Activity Tolerance Patient tolerated treatment well             Past Medical History:  Diagnosis Date   Hypertension    Hypothyroidism     Past Surgical History:  Procedure Laterality Date   IR FLUORO GUIDE CV LINE RIGHT  05/08/2021   IR US GUIDE VASC ACCESS RIGHT  05/08/2021   PERIPHERAL VASCULAR THROMBECTOMY N/A 06/14/2021   Procedure: PERIPHERAL VASCULAR THROMBECTOMY;  Surgeon: Sherren Kerns, MD;  Location: [facility] INVASIVE CV LAB;  Service: Cardiovascular;  Laterality: N/A;    There were no vitals filed for this visit.   Subjective Assessment - 06/27/21 1617     Subjective "I just still have some trouble with my memory."    Patient is accompained by: Family member    Special Tests SLUMS    Currently in Pain? No/denies                SLP Evaluation [facility] - 06/27/21 1617       SLP Visit Information   SLP Received On 06/27/21    Referring Provider (SLP) Mariam Dollar, PA-C    Onset Date 05/03/2021    Medical Diagnosis Neuromyelitis optica      Subjective   Subjective "trouble withe memory"    Patient/Family Stated Goal "Improve memory"      General Information   HPI Patient is a 20 y.o. year old female with history of hypertension, tobacco use, CKD stage III,  as well as thyroid disorder. Presented 05/03/2021 with generalized weakness poor appetite/nausea and vomiting as well as  blurred vision x 2 weeks. 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  MRI of the brain showed hyperintense T2 weighted signal and mild contrast-enhancement within the medial thalami and periaqueductal gray matter.  Demyelinating disease remained a primary consideration. She was admitted to CIR and d/c'd home 06/05/21, but was then re-hospitalized due to other medical complications (DVT). She was referred for OP SLP services to address cognitive communications s/p recent hospitalization.    Behavioral/Cognition Alert and cooperative    Mobility Status wheelchair      Balance Screen   Has the patient fallen in the past 6 months Yes    How many times? 3    Has the patient had a decrease in activity level because of a fear of falling?  Yes    Is the patient reluctant to leave their home because of a fear of falling?  Yes      Prior Functional Status   Cognitive/Linguistic Baseline Within functional limits    Type of Home House     Lives With Family    Available Support Family    Education attending [employer] for political science    Vocation Student      Pain Assessment  Pain Assessment No/denies pain      Cognition   Overall Cognitive Status Impaired/Different from baseline    Area of Impairment Attention;Memory    Current Attention Level Sustained    Memory Decreased short-term memory    Attention Sustained    Sustained Attention Impaired    Sustained Attention Impairment Verbal complex    Memory Impaired    Memory Impairment Decreased short term memory    Decreased Short Term Memory Verbal complex    Awareness Appears intact    Problem Solving Appears intact      Auditory Comprehension   Overall Auditory Comprehension Appears within functional limits for tasks assessed    Yes/No Questions Within Functional Limits    Commands Within Functional Limits    Conversation Complex    Interfering  Components Working Engineer, materials Within Function Limits      Reading Comprehension   Reading Status Not tested      Expression   Primary Mode of Expression Verbal      Verbal Expression   Overall Verbal Expression Appears within functional limits for tasks assessed    Initiation No impairment    Level of Generative/Spontaneous Verbalization Conversation    Repetition No impairment    Naming No impairment    Pragmatics No impairment      Written Expression   Dominant Hand Left    Written Expression Not tested      Oral Motor/Sensory Function   Overall Oral Motor/Sensory Function Appears within functional limits for tasks assessed      Motor Speech   Overall Motor Speech Appears within functional limits for tasks assessed    Respiration Within functional limits    Phonation Normal    Resonance Within functional limits    Articulation Within functional limitis    Intelligibility Intelligible    Motor Planning Witnin functional limits    Motor Speech Errors Not applicable    Phonation [facility]      Standardized Assessments   Standardized Assessments  --   SLUMS 26/30             06/27/21 1333  SLP SHORT TERM GOAL #1  Title Pt will complete functional memory tasks with 95% acc with use of memory strategies as needed.  Baseline 85%  Time 4  Period Weeks  Status New  Target Date 08/12/21  SLP SHORT TERM GOAL #2  Title Pt will complete moderately complex planning and organization tasks with 95% acc with min assist as needed.  Baseline mi/mod assist  Time 4  Period Weeks  Status New  Target Date 08/12/21     Plan - 06/27/21 1634     Clinical Impression Statement Pt presents with mild cognitive linguistic deficits characterized by reduced attention and working memory which negatively impact independence with memory and organization in her home environment. Pt feels that she has improved tremendously since she left the  hospital, but is not yet back to baseline.Pt will benefit from skilled SLP in order to address the above impairments, maximize independence, and decrease burden of care.   Speech Therapy Frequency 1x /week    Duration 4 weeks    Treatment/Interventions Cueing hierarchy;SLP instruction and feedback;Compensatory techniques;Cognitive reorganization;Patient/family education;Internal/external aids    Potential to Achieve Goals Good    SLP Home Exercise Plan Pt will completed HEP as assigned to facilitate carryover of treatment strategies and techniques in home environment with use of written cues as needed.  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

## 2021-06-27 NOTE — Therapy (Signed)
[facility] [address] [place], [place], [address] Phone: [phone]   Fax:  [phone]  Physical Therapy Evaluation  Patient Details  Name: Diane Todd MRN: [id number] Date of Birth: 2001-07-29 Referring Provider (PT): Lia Hopping   Encounter Date: 06/27/2021   PT End of Session - 06/27/21 1300     Visit Number 1    Number of Visits 12    Date for PT Re-Evaluation 08/08/21    Authorization Type [place] [employer], auth required    Authorization - Visit Number 1    Authorization - Number of Visits 3    Progress Note Due on Visit 3    PT Start Time 1300    PT Stop Time 1345    PT Time Calculation (min) 45 min    Equipment Utilized During Treatment Gait belt    Activity Tolerance Patient limited by pain;Patient limited by fatigue    Behavior During Therapy [facility] for tasks assessed/performed;Anxious             Past Medical History:  Diagnosis Date   Hypertension    Hypothyroidism     Past Surgical History:  Procedure Laterality Date   IR FLUORO GUIDE CV LINE RIGHT  05/08/2021   IR US GUIDE VASC ACCESS RIGHT  05/08/2021   PERIPHERAL VASCULAR THROMBECTOMY N/A 06/14/2021   Procedure: PERIPHERAL VASCULAR THROMBECTOMY;  Surgeon: Sherren Kerns, MD;  [facility];  Service: Cardiovascular;  Laterality: N/A;    There were no vitals filed for this visit.    Subjective Assessment - 06/27/21 1303     Subjective Pt was hospitalized for a month for a neurological autoimmune disorder and was mostly bed fast for a month and then experienced onset of BLE DVT. Now presents to PT with generalized weakness and reduced functional mobility    Patient is accompained by: Family member    How long can you stand comfortably? 1-2 min    How long can you walk comfortably? unable    Currently in Pain? No/denies    Pain Score 0-No pain                [facility] PT Assessment - 06/27/21 0001       Assessment    Medical Diagnosis Acute deep vein thrombosis (DVT) of other specified vein of both lower extremities    Referring Provider (PT) Lia Hopping      Balance Screen   Has the patient fallen in the past 6 months Yes    How many times? 3    Has the patient had a decrease in activity level because of a fear of falling?  Yes    Is the patient reluctant to leave their home because of a fear of falling?  Yes      Home Environment   Living Environment Private residence    Living Arrangements Other relatives;Parent    Available Help at Discharge Family    Type of Home House    Home Access Stairs to enter    Entrance Stairs-Number of Steps 5    Entrance Stairs-Rails Left    Home Layout One level    Home Equipment Walker - 2 wheels;Wheelchair - [employer]      Prior Function   Level of Independence Independent    Vocation Full time employment    Vocation Requirements [employer]    Leisure go out, hanging out with friends      Coordination  Gross Motor Movements are Fluid and Coordinated Yes      Strength   Right Hip Flexion 3+/5    Right Hip ABduction 4-/5    Right Hip ADduction 4/5    Left Hip Flexion 3/5    Left Hip ABduction 3+/5    Left Hip ADduction 4/5    Right Knee Flexion 4-/5    Right Knee Extension 4-/5    Left Knee Flexion 3+/5    Left Knee Extension 3+/5    Right Ankle Dorsiflexion 4-/5    Right Ankle Plantar Flexion 3+/5    Left Ankle Dorsiflexion 3+/5    Left Ankle Plantar Flexion 4-/5      Bed Mobility   Rolling Right Independent    Rolling Left Independent    Supine to Sit Independent    Sit to Supine Independent      Transfers   Transfers Sit to Stand;Stand Pivot Transfers    Sit to Stand 4: Min guard    Stand Pivot Transfers 4: Min guard      Ambulation/Gait   Ambulation/Gait Yes    Ambulation/Gait Assistance 4: Min guard    Ambulation Distance (Feet) 3 Feet    Assistive device Rolling walker    Gait Pattern Step-to  pattern;Antalgic;Decreased stance time - left    Stairs No   demonstrated shower seat technique for ascending/descending stairs   Gait Comments LLE calf pain limits weight acceptance      Wheelchair Mobility   Wheelchair Mobility Yes    Wheelchair Propulsion Both upper extremities;Right lower extremity    Wheelchair Parts Management Needs assistance    Distance 150                        Objective measurements completed on examination: See above findings.       [facility] Adult PT Treatment/Exercise - 06/27/21 0001       Exercises   Exercises Knee/Hip      Knee/Hip Exercises: Stretches   Gastroc Stretch Left;2 reps;30 seconds    Gastroc Stretch Limitations with towel      Knee/Hip Exercises: Supine   Quad Sets Strengthening;Left;2 sets;10 reps    Straight Leg Raises Strengthening;Left;2 sets;10 reps                    PT Education - 06/27/21 1357     Education Details assessment findings discussed. Stair ascending techniuqe with shower seat    Person(s) Educated Patient;Caregiver(s)    Methods Explanation;Demonstration    Comprehension Verbalized understanding              PT Short Term Goals - 06/27/21 1410       PT SHORT TERM GOAL #1   Title Patient will be independent with HEP in order to improve functional outcomes.    Time 3    Period Weeks    Status New    Target Date 07/18/21      PT SHORT TERM GOAL #2   Title Patient will report at least 25% improvement in symptoms for improved quality of life.    Time 3    Period Weeks    Status New    Target Date 07/18/21      PT SHORT TERM GOAL #3   Title Demonstrate modified independent functional transfers, with or without AD    Baseline CGA with RW    Time 3    Period Weeks    Status New  Target Date 07/18/21      PT SHORT TERM GOAL #4   Title Patient will be able to ambulate level surfaces with supervision and RW x 50 ft to improve functional ambulation    Baseline 3 ft with  CGA and RW    Time 3    Period Weeks    Status New    Target Date 07/18/21               PT Long Term Goals - 06/27/21 1412       PT LONG TERM GOAL #1   Title Patient will be able to ambulate x 200 ft during using least restrictive AD to manifest improved gait capabilities    Baseline 3 ft with RW and CGA    Time 6    Period Weeks    Status New    Target Date 08/08/21      PT LONG TERM GOAL #2   Title Demo ability to negotiate 5 steps modified independent to enable home entry    Baseline total assist in w/c    Time 6    Period Weeks    Status New    Target Date 08/08/21                    Plan - 06/27/21 1358     Clinical Impression Statement Pt is 20 yo lady demonstrating generalized weakness, reduced ability to allow weight acceptance LLE, balance deficits, reduced functional activity tolerance, difficulty in walking, increased need for caregiver assistance, decline in functional mobility indicating need for PT intervention to improve LE strength, improve balance, provide training/instruction in DME, initiate gait training, and decrease need for caregiver assistance.    Personal Factors and Comorbidities Comorbidity 1    Comorbidities PMH    Examination-Activity Limitations Carry;Lift;Toileting;Stand;Stairs;Squat;Reach Overhead;Locomotion Level;Transfers    Examination-Participation Restrictions Cleaning;Community Activity;Driving;Laundry;Yard Work;Shop;Occupation;Meal Prep    Stability/Clinical Decision Making Evolving/Moderate complexity    Clinical Decision Making Moderate    Rehab Potential Good    PT Frequency 2x / week    PT Duration 6 weeks    PT Treatment/Interventions ADLs/Self Care Home Management;DME Instruction;Gait training;Stair training;Functional mobility training;Therapeutic activities;Therapeutic exercise;Balance training;Patient/family education;Neuromuscular re-education;Wheelchair mobility training;Manual techniques    PT Next Visit  Plan Continue with WBing activities, weight shifting, gait training, bed exercise    PT Home Exercise Plan calf stretch, QS, SLR    Consulted and Agree with Plan of Care Patient             Patient will benefit from skilled therapeutic intervention in order to improve the following deficits and impairments:  Abnormal gait, Decreased activity tolerance, Decreased balance, Decreased mobility, Decreased endurance, Decreased strength, Difficulty walking, Impaired perceived functional ability, Improper body mechanics, Pain  Visit Diagnosis: Difficulty in walking, not elsewhere classified  Muscle weakness (generalized)  Other abnormalities of gait and mobility  Unsteadiness on feet     Problem List Patient Active Problem List   Diagnosis Date Noted   DVT (deep venous thrombosis) (HCC) 06/10/2021   AKI (acute kidney injury) (HCC) 06/10/2021   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 Shary Decamp, PT, DPT Physical Therapist- [place] Office Number: [phone]   [place] [facility] [address] [place], [place],  [id number] Phone: [phone]   Fax:  [phone]  Name: Evaristo BuryOdalys Olguin-Trejo MRN: [id number] Date of Birth: May 02, 2001

## 2021-06-27 NOTE — Therapy (Signed)
[facility] [address] [place], [place], [address] Phone: [phone]   Fax:  [phone]  Occupational Therapy Evaluation  Patient Details  Name: Diane Todd MRN: [id number] Date of Birth: 30-Aug-2001 Referring Provider (OT): Cruzita Lederer, PA-C   Encounter Date: 06/27/2021   OT End of Session - 06/27/21 1712     Visit Number 1    Number of Visits 8    Date for OT Re-Evaluation 07/25/21    Authorization Type [employer]    Authorization Time Period 27 visits combined a year for OT/PT/SLP. Requesting 8 visits.    Authorization - Visit Number 0    Authorization - Number of Visits 8    OT Start Time 1353    OT Stop Time 1435    OT Time Calculation (min) 42 min    Activity Tolerance Patient tolerated treatment well    Behavior During Therapy WFL for tasks assessed/performed             Past Medical History:  Diagnosis Date   Hypertension    Hypothyroidism     Past Surgical History:  Procedure Laterality Date   IR FLUORO GUIDE CV LINE RIGHT  05/08/2021   IR US GUIDE VASC ACCESS RIGHT  05/08/2021   PERIPHERAL VASCULAR THROMBECTOMY N/A 06/14/2021   Procedure: PERIPHERAL VASCULAR THROMBECTOMY;  Surgeon: Sherren Kerns, MD;  [facility];  Service: Cardiovascular;  Laterality: N/A;    There were no vitals filed for this visit.   Subjective Assessment - 06/27/21 1404     Subjective  S: I'd like to be able to cook again.    Patient is accompanied by: Family member   Brother: Jr.   Pertinent History Patient is a 20 y/o female with history of hypertension tobacco abuse CKD stage III as well as thyroid disorder.  Patient lives with parent.  Independent prior to admission.  Prior to 4/22 was working at [employer] going to [employer]  Presented 05/03/2021 with generalized weakness poor appetite nausea and vomiting as well as blurred vision x2 weeks. Initial work-up at bedside hospital/[facility] showed  lesions highly suggestive of NMSOD/neuromyelitis optica spectrum disorder.She was admitted to [facility] 7/1-7/20 where she received both PT/OT/SLP. She was hospitalized again on 06/14/21 for a DVT in left lower extremity. Diane Santee, PA-C has referred patient to occupational therapy for evaluation and treatment.    Patient Stated Goals To be more independent.    Currently in Pain? No/denies               [facility] OT Assessment - 06/27/21 1410       Assessment   Medical Diagnosis UB weakness/decreased coordination    Referring Provider (OT) Cruzita Lederer, PA-C    Onset Date/Surgical Date 05/17/21    Hand Dominance Right    Next MD Visit 07/04/21   Dr. Jacalyn Lefevre - nuero   Prior Therapy [facility] - OT/PT/SLP july 2022      Precautions   Precautions Fall      Restrictions   Weight Bearing Restrictions No      Balance Screen   Has the patient fallen in the past 6 months Yes    How many times? 3    Has the patient had a decrease in activity level because of a fear of falling?  Yes    Is the patient reluctant to leave their home because of a fear of falling?  Yes      Home  Environment   Family/patient expects to be discharged to: Private residence    Living Arrangements Parent    Available Help at Discharge Family    Bathroom Shower/Tub Tub/Shower unit    Office managerBathroom Toilet Standard    Bathroom Accessibility Yes    How accessible Accessible via wheelchair    Home Equipment Walker - 2 wheels;Bedside commode;Tub bench;Wheelchair - manual      Prior Function   Level of Independence Independent    Vocation Full time employment    Radio producerVocation Requirements [employer]    Leisure hanging with friends, Actuaryacrylic painting      ADL   Grooming Modified independent    Upper Body Bathing Modified independent    Lower Body Bathing Modified independent    Upper Body Dressing Other (comment)   Modified independent   Lower Body Dressing Modified independent    Toilet Transfer Modified  independent    Toileting - Clothing Manipulation Modified independent    Toileting -  Hygiene Modified Independent    Tub/Shower Transfer Modified independent    ADL comments Reports that she has not attempted to complete a meal prep task due to her hand tremors and leg weakness. [facility] walking into the bathroom to use the toilet due to decreased endurance.      Mobility   Mobility Status History of falls;Needs assist      Written Expression   Dominant Hand Right      Vision - History   Baseline Vision No visual deficits    Additional Comments Pt reports that her vision is at her baseline. All blurry vision has resolved.      Observation/Other Assessments   Other Surveys  Select    Upper Extremity Functional Index  47/80      Sensation   Light Touch Appears Intact    Stereognosis Appears Intact    Hot/Cold Appears Intact      Coordination   Gross Motor Movements are Fluid and Coordinated No    Fine Motor Movements are Fluid and Coordinated No    9 Hole Peg Test Left;Right    Right 9 Hole Peg Test 35.5"    Left 9 Hole Peg Test 43.3"    Tremors Bilateral hand tremor noted during coordination assessment.      ROM / Strength   AROM / PROM / Strength Strength      Strength   Overall Strength Comments Assessed seated. IR/er abducted    Strength Assessment Site Hand;Shoulder;Elbow    Right/Left Shoulder Right;Left    Right Shoulder Flexion 4+/5    Right Shoulder ABduction 5/5    Right Shoulder Internal Rotation 5/5    Right Shoulder External Rotation 5/5    Left Shoulder Flexion 4/5    Left Shoulder ABduction 5/5    Left Shoulder Internal Rotation 4+/5    Left Shoulder External Rotation 5/5    Right/Left Elbow Left;Right    Right Elbow Flexion 5/5    Right Elbow Extension 5/5    Left Elbow Flexion 5/5    Left Elbow Extension 5/5    Right/Left hand Left;Right    Right Hand Grip (lbs) 35    Right Hand Lateral Pinch 12 lbs    Right Hand 3 Point Pinch  12 lbs    Left Hand Grip (lbs) 35    Left Hand Lateral Pinch 14 lbs    Left Hand 3 Point Pinch 12 lbs  OT Education - 06/27/21 1709     Education Details discussed evaluation findings and goals for therapy.    Person(s) Educated Patient;Other (comment)   brother   Methods Explanation    Comprehension Verbalized understanding              OT Short Term Goals - 06/27/21 1723       OT SHORT TERM GOAL #1   Title Patient will be educated and independent with HEP in order to faciliate her progress in therapy and increase her independence with simple meal prep tasks.    Time 4    Period Weeks    Status New    Target Date 07/25/21      OT SHORT TERM GOAL #2   Title Patient will increase her bilateral hand strength by 15# or more in order to increase her ability to open tight or new jars and containers with less difficulty.    Time 4    Period Weeks    Status New      OT SHORT TERM GOAL #3   Title Patient will increase her left hand coordination by completing the 9 hole peg test in 39 seconds or less.    Time 4    Period Weeks    Status New      OT SHORT TERM GOAL #4   Title Pt will be educated on adaptive techniques and/or equipment that may help increase her independence when completing leisure and I/ADL activities.    Time 4    Period Weeks    Status New                      Plan - 06/27/21 1715     Clinical Impression Statement A: Pt is a 20 y/o female experiencing decreased coordination and UB strength due to recent diagnosis of neuromylitis optica (NMO) resulting in difficulty completing required ADL, IADL, and leisure tasks without increased assistance.    OT Occupational Profile and History Problem Focused Assessment - Including review of records relating to presenting problem    Occupational performance deficits (Please refer to evaluation for details): IADL's;Leisure;ADL's    Body Structure / Function /  Physical Skills ADL;FMC;Coordination;GMC;IADL;Endurance    Rehab Potential Excellent    Clinical Decision Making Limited treatment options, no task modification necessary    Comorbidities Affecting Occupational Performance: Presence of comorbidities impacting occupational performance    Comorbidities impacting occupational performance description: see medical chart    Modification or Assistance to Complete Evaluation  No modification of tasks or assist necessary to complete eval    OT Frequency 2x / week    OT Duration 4 weeks    OT Treatment/Interventions Self-care/ADL training;Energy conservation;Patient/family education;DME and/or AE instruction;Balance training;Coping strategies training;Neuromuscular education;Therapeutic exercise;Therapeutic activities;Manual Therapy    Plan P: Patient will benefit from skilled OT services to increase functional performance during ADL tasks such as meal prep, leisure, activity tolerance and endurance, coordination, and strength. Treatment plan: energy conservation techniques, ADL re-training (meal prep), coordination, hand strengthening, adaptive equipment and strategies to increase independence. Next session: Discuss what type of simple meal could we make in the clinic. Provide HEP for hand strength.    OT Home Exercise Plan Will be provided at first treatment session.    Consulted and Agree with Plan of Care Patient             Patient will benefit from skilled therapeutic intervention in order to improve the following deficits and impairments:  Body Structure / Function / Physical Skills: ADL, FMC, Coordination, GMC, IADL, Endurance       Visit Diagnosis: Other symptoms and signs involving the musculoskeletal system - Plan: Ot plan of care cert/re-cert  Other lack of coordination - Plan: Ot plan of care cert/re-cert    Problem List Patient Active Problem List   Diagnosis Date Noted   DVT (deep venous thrombosis) (HCC) 06/10/2021   AKI  (acute kidney injury) (HCC) 06/10/2021   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 [place], [place], [address] Phone: [phone]   Fax:  [phone]  Name: Diane Todd MRN: [id number] Date of Birth: [birth year]

## 2021-07-02 ENCOUNTER — Ambulatory Visit (HOSPITAL_COMMUNITY): Payer: Medicaid Other | Admitting: Occupational Therapy

## 2021-07-04 ENCOUNTER — Encounter: Payer: Self-pay | Admitting: Neurology

## 2021-07-04 ENCOUNTER — Encounter (HOSPITAL_COMMUNITY): Payer: Self-pay

## 2021-07-04 ENCOUNTER — Telehealth (HOSPITAL_COMMUNITY): Payer: Self-pay

## 2021-07-04 ENCOUNTER — Other Ambulatory Visit: Payer: Self-pay | Admitting: Neurology

## 2021-07-04 ENCOUNTER — Encounter (HOSPITAL_COMMUNITY): Payer: Medicaid Other

## 2021-07-04 ENCOUNTER — Ambulatory Visit (INDEPENDENT_AMBULATORY_CARE_PROVIDER_SITE_OTHER): Payer: Medicaid Other | Admitting: Neurology

## 2021-07-04 VITALS — BP 130/92 | HR 144 | Ht 67.0 in

## 2021-07-04 DIAGNOSIS — B9681 Helicobacter pylori [H. pylori] as the cause of diseases classified elsewhere: Secondary | ICD-10-CM

## 2021-07-04 DIAGNOSIS — G360 Neuromyelitis optica [Devic]: Secondary | ICD-10-CM

## 2021-07-04 DIAGNOSIS — I82423 Acute embolism and thrombosis of iliac vein, bilateral: Secondary | ICD-10-CM

## 2021-07-04 DIAGNOSIS — R9089 Other abnormal findings on diagnostic imaging of central nervous system: Secondary | ICD-10-CM

## 2021-07-04 DIAGNOSIS — E512 Wernicke's encephalopathy: Secondary | ICD-10-CM | POA: Diagnosis not present

## 2021-07-04 DIAGNOSIS — R29898 Other symptoms and signs involving the musculoskeletal system: Secondary | ICD-10-CM

## 2021-07-04 DIAGNOSIS — E43 Unspecified severe protein-calorie malnutrition: Secondary | ICD-10-CM | POA: Diagnosis not present

## 2021-07-04 DIAGNOSIS — K2970 Gastritis, unspecified, without bleeding: Secondary | ICD-10-CM | POA: Diagnosis not present

## 2021-07-04 NOTE — Telephone Encounter (Signed)
Called patient regarding no show for today's OT appointment. Voicemail box was full and I was unable to leave a message.   Sent a patient message directly to patient through MyChart regarding no show appointment and to remind her of the next scheduled appointment.   Limmie Patricia, OTR/L,CBIS  [phone]

## 2021-07-04 NOTE — Progress Notes (Signed)
[facility]  PATIENT: Diane Todd DOB: Oct 25, 2001  REFERRING DOCTOR OR PCP: Dr. Olena LeatherwoodHasanaj SOURCE: Patient, notes from primary care, notes from hospital, laboratory and imaging reports, imaging personally reviewed.  _________________________________   HISTORICAL  CHIEF COMPLAINT:  Chief Complaint  Patient presents with   New Patient (Initial Visit)    RM 1 with parents. 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 Doing outpt PT 2x/week, started this last week. Unable to stand, in wheelchair. Denies any vision problems. HR today: 144. Denies SOB/pouding. Has never seen cardiology. States she feels pretty good, no complaints. Had blood clot in left leg, she had surgery for this back in July.    HISTORY OF PRESENT ILLNESS:  I had the pleasure of [facility] your patient, Diane Todd, [facility] for neurologic consultation regarding her recent neurologic events, abnormal brain MRI and concerned about neuromyelitis optica.  She is a 20 year old woman who began to experience various neurologic symptoms in June 2022, couple months after she had severe nutritional deficits.   She had nausea but not vomiting starting around April but then had vomiting as well in May into June.   She lost about 50 pounds during that time.  She feels her nutritional intake was near zero for a couple months .  She got some liquids in but no solid food.  [facility] for [facility].    She had H. Pylori on EGD but was unable to swallow the pills.     In June 2022, she had the onset of blurry vision followed by weakness and reduced ability to walk   Her memory was impaired.   She does not believe she experienced any neurologic deficits in April or May.  At the time, she was eating poorly.   No alcohol use.   She had an MRI 05/02/2021 showing increased T2 hyperintensity in periaqueductal grey matter and midline thalamus.   She was admitted  and had additional scans and evaluation.     She reports that the nausea started April 2022 but difficulties with strength/gait did not start until early June.    She was admitted May 02, 2021 and had testing.  Thiamine was low and was infused.   She has no memory of events from about 04/26/21 through late July 2022.    She feels much better cognitively and now just feels minimally forgetful.    She is still not able to walk due to weakness.  Additionally, she has a left DVT that is painful and is limiting her walking.  She is doing therapy.  Arms are just slightly weak.   She has mild tremor at times.   She feels vision is better and she is seeing fairly clearly now.    Bladder is doing well.      She was discharged in early July but developed a left DVT and was readmitted and started on Eliquis.   She denies SOB.  When thiamine was rechecked during that admission, it was normal.  A repeat MRI during that admission showed improved appearance of the T2 hyperintense changes and no further enhancement she denies palpitations but has a rapid heart rate.    She began to have less nausea in late June and has beemneating well since late June 2022.   She received IV thiamine, steroids and plasmapheresis whil ehositalized in June 2022.      Besides above issues, she has hyperthyroidism and hypertension.  Imaging and other data reviewed MRI of the head 05/04/2021 showed T2 hyperintensity in the periaqueductal/periventricular gray matter of the medulla, pons and midbrain.  Similarly in the medial thalamus.  The T2 hyperintense areas also have mild enhancement after contrast.  MRI of the cervical and thoracic spine 05/04/2021 was normal.  MRI of the orbits 05/05/2021 we demonstrated the T2 hyperintensity with subtle enhancement of the periaqueductal gray matter and medial thalamus.  The orbits were normal.  MRI of the head 06/11/2021 showed marked improvement of the T2/FLAIR hyperintensity in the periaqueductal  gray matter and medial thalamus though some residual changes were noted.  There was no abnormal enhancement.  Laboratory studies from June 2022: Neuromyelitis optica IgG 05/14/2021 was negative.  CSF 05/13/2021 showed an increased IgG index but no oligoclonal bands there were 8 white blood cells, normal protein, elevated glucose.  Thiamine 05/13/2021 was low at 31 and 38 (66-200 normal).  When we checked 06/15/2021 it was normal  HIV negative.  Creatine kinase 06/11/2021 was elevated at 1500.  ANA was negative.  Lupus anticoagulant and DRV VT was elevated.  Indeterminate anticardiolipin IgM was noted.  DRV VT confirm was slightly elevated.   REVIEW OF SYSTEMS: Constitutional: No fevers, chills, sweats, or change in appetite Eyes: No visual changes, double vision, eye pain Ear, nose and throat: No hearing loss, ear pain, nasal congestion, sore throat Cardiovascular: No chest pain, palpitations Respiratory:  No shortness of breath at rest or with exertion.   No wheezes GastrointestinaI: No nausea, vomiting, diarrhea, abdominal pain, fecal incontinence Genitourinary:  No dysuria, urinary retention or frequency.  No nocturia. Musculoskeletal:  No neck pain, back pain Integumentary: No rash, pruritus, skin lesions Neurological: as above Psychiatric: No depression at this time.  No anxiety Endocrine: No palpitations, diaphoresis, change in appetite, change in weigh or increased thirst Hematologic/Lymphatic:  No anemia, purpura, petechiae. Allergic/Immunologic: No itchy/runny eyes, nasal congestion, recent allergic reactions, rashes  ALLERGIES: No Known Allergies  HOME MEDICATIONS:  Current Outpatient Medications:    apixaban (ELIQUIS) 5 MG TABS tablet, Take 1 tablet (5 mg total) by mouth 2 (two) times daily., Disp: 60 tablet, Rfl: 0   apixaban (ELIQUIS) 5 MG TABS tablet, Take 2 tablets (10 mg total) by mouth 2 (two) times daily., Disp: 26 tablet, Rfl: 0   bacitracin ointment, Apply 1  application topically 2 (two) times daily., Disp: 120 g, Rfl: 0   HYDROcodone-acetaminophen (NORCO/VICODIN) 5-325 MG tablet, Take 1-2 tablets by mouth every 4 (four) hours as needed for moderate pain., Disp: 30 tablet, Rfl: 0   levothyroxine (SYNTHROID) 100 MCG tablet, Take 1 tablet (100 mcg total) by mouth daily., Disp: 30 tablet, Rfl: 0   lidocaine (LIDODERM) 5 %, Place 2 patches onto the skin daily. Remove & Discard patch within 12 hours or as directed by MD, Disp: 30 patch, Rfl: 0   melatonin 3 MG TABS tablet, Take 1 tablet (3 mg total) by mouth at bedtime., Disp: 30 tablet, Rfl: 0   propranolol (INDERAL) 10 MG tablet, Take 1 tablet (10 mg total) by mouth daily., Disp: 30 tablet, Rfl: 0   thiamine 100 MG tablet, Take 1 tablet (100 mg total) by mouth daily., Disp: 30 tablet, Rfl: 3   traMADol (ULTRAM) 50 MG tablet, Take 1 tablet (50 mg total) by mouth every 6 (six) hours as needed for severe pain., Disp: 30 tablet, Rfl: 0  PAST MEDICAL HISTORY: Past Medical History:  Diagnosis Date   Hypertension    Hypothyroidism  PAST SURGICAL HISTORY: Past Surgical History:  Procedure Laterality Date   IR FLUORO GUIDE CV LINE RIGHT  05/08/2021   IR US GUIDE VASC ACCESS RIGHT  05/08/2021   PERIPHERAL VASCULAR THROMBECTOMY N/A 06/14/2021   Procedure: PERIPHERAL VASCULAR THROMBECTOMY;  Surgeon: Sherren Kerns, MD;  Location: [facility] INVASIVE CV LAB;  Service: Cardiovascular;  Laterality: N/A;    FAMILY HISTORY: Family History  Problem Relation Age of Onset   Hypertension Mother    Diabetes Mellitus II Other     SOCIAL HISTORY:  Social History   Socioeconomic History   Marital status: Single    Spouse name: Not on file   Number of children: 0   Years of education: 13   Highest education level: Not on file  Occupational History   Not on file  Tobacco Use   Smoking status: Former    Types: E-cigarettes   Smokeless tobacco: Never  Vaping Use   Vaping Use: Some days   Substances:  Nicotine, Flavoring  Substance and Sexual Activity   Alcohol use: Not Currently   Drug use: Not Currently   Sexual activity: Not Currently  Other Topics Concern   Not on file  Social History Narrative   Right handed   Caffeine use: soda (1 per day)   Lives with parents   Social Determinants of Health   Financial Resource Strain: Not on file  Food Insecurity: Not on file  Transportation Needs: Not on file  Physical Activity: Not on file  Stress: Not on file  Social Connections: Not on file  Intimate Partner Violence: Not on file     PHYSICAL EXAM  Vitals:   07/04/21 1330  BP: (!) 130/92  Pulse: (!) 144  SpO2: 98%  Height: 5\' 7"  (1.702 m)    Pulse recheck 112 and regular  Body mass index is 33.53 kg/m.  Visual acuity is 20/25 OS and OD  General: The patient is well-developed and well-nourished and in no acute distress  HEENT:  Head is [place]/AT.  Sclera are anicteric.  Funduscopic exam shows normal optic discs and retinal vessels.  Neck: No carotid bruits are noted.  The neck is nontender.  Cardiovascular: The heart has a regular rate and rhythm with a normal S1 and S2. There were no murmurs, gallops or rubs.    Skin: Extremities are without rash or  edema.  Musculoskeletal/extremities:  Back is nontender.  She is very tender over the left calf though there is no asymmetry in size compared to the right.  The appearance of the calf is normal.  Neurologic Exam  Mental status: The patient is alert and oriented x 3 at the time of the examination. The patient has apparent normal recent and remote memory, with an apparently normal attention span and concentration ability.   Speech is normal.  Cranial nerves: Extraocular movements are full. Pupils are equal, round, and reactive to light and accomodation.  Visual fields are full.  Facial symmetry is present. There is good facial sensation to soft touch bilaterally.Facial strength is normal.  Trapezius and  sternocleidomastoid strength is normal. No dysarthria is noted.  The tongue is midline, and the patient has symmetric elevation of the soft palate. No obvious hearing deficits are noted.  Motor:  Muscle bulk is normal.   Tone is normal. Strength is  5 / 5 in the arms and 4+/5 in the right leg.  Left leg is difficult to test due to the DVT pain.    Sensory: Sensory testing  is intact to pinprick, soft touch and vibration sensation in all 4 extremities.  Coordination: Cerebellar testing reveals good finger-nose-finger and heel-to-shin bilaterally.  Gait and station: Gait is difficult to test as bearing weight on the left is very painful.    Reflexes: Deep tendon reflexes are symmetric and normal bilaterally.   Plantar responses are flexor.    DIAGNOSTIC DATA (LABS, IMAGING, TESTING) - I reviewed patient records, labs, notes, testing and imaging myself where available.  Lab Results  Component Value Date   WBC 6.7 06/16/2021   HGB 10.2 (L) 06/16/2021   HCT 33.6 (L) 06/16/2021   MCV 93.6 06/16/2021   PLT 270 06/16/2021      Component Value Date/Time   NA 140 06/16/2021 0217   K 4.1 06/16/2021 0217   CL 112 (H) 06/16/2021 0217   CO2 20 (L) 06/16/2021 0217   GLUCOSE 89 06/16/2021 0217   BUN 5 (L) 06/16/2021 0217   CREATININE 1.12 (H) 06/16/2021 0217   CALCIUM 8.9 06/16/2021 0217   PROT 6.6 06/11/2021 0158   ALBUMIN 2.9 (L) 06/11/2021 0158   ALBUMIN 3.8 (L) 05/09/2021 0830   AST 47 (H) 06/11/2021 0158   ALT 25 06/11/2021 0158   ALKPHOS 64 06/11/2021 0158   BILITOT 0.8 06/11/2021 0158   GFRNONAA >60 06/16/2021 0217   No results found for: CHOL, HDL, LDLCALC, LDLDIRECT, TRIG, CHOLHDL Lab Results  Component Value Date   HGBA1C 5.8 (H) 05/04/2021   No results found for: VITAMINB12 Lab Results  Component Value Date   TSH 1.197 06/11/2021       ASSESSMENT AND PLAN  Protein-calorie malnutrition, severe (HCC) - Plan: ANCA Profile  Neuromyelitis optica (devic) (HCC) -  Plan: Neuromyelitis optica autoab, IgG, Anti-MOG, Serum, Pan-ANCA, ANCA Profile  Protein-calorie malnutrition, severe  Helicobacter pylori gastritis - Plan: ANCA Profile  Acute deep vein thrombosis (DVT) of iliac vein of both lower extremities (HCC) - Plan: ANCA Profile  Weakness of both lower extremities - Plan: ANCA Profile  Abnormal brain MRI - Plan: Neuromyelitis optica autoab, IgG, Anti-MOG, Serum, Pan-ANCA, ANCA Profile   In summary, Ms. Todd is a 20 year old woman who had severe malnutrition in April May and June 2022 due to intractable nausea with some vomiting and began to develop neurologic symptoms in June 2022.  The MRI of the brain was admitted in mid June showed abnormal T2 hyperintensity in the periaqueductal gray and the medial thalamus.  Findings were very symmetric.  The main differential is between neuromyelitis optica and severe thiamine deficiency.  She improved but had multiple therapeutic interventions in the past including thiamine infusion, plasmapheresis and steroids.   There was improved MRI appearance in late July.  At the 2 possible explanations for symptoms, I feel thiamine deficiency (Wernicke's encephalopathy) is the more likely.  This is based on her having nutritional deficiency with nausea and vomiting for at least 2 months before neurologic symptoms started, demonstration of severe thiamine deficiency, lack of anti-NMO antibody's and the excellent recovery of vision.  Hopefully her other neurologic symptoms will continue to improve.  Because neuromyelitis optica and unusual vasculitis is still in the differential, I will recheck the anti-NMO antibody and check the anti-MOG and ANCA studies.  She is advised to continue therapy.  She is also advised to contact primary care or seek emergency care if the DVT in the leg appears to worsen.  I will see her back in about 2 to 3 months or sooner if there are new or worsening neurologic  symptoms.  Thank you for  asking me to see this patient.  Please let me know if I can be of further assistance with her or other patients in the future.   Jerimie Mancuso A. Epimenio Foot, MD, [facility] 07/05/2021, 8:56 AM Certified in Neurology, Clinical Neurophysiology, Sleep Medicine and Neuroimaging  [facility] [address], [address] [place], [place] [address] [phone]

## 2021-07-05 ENCOUNTER — Encounter (HOSPITAL_COMMUNITY): Payer: Medicaid Other | Attending: Physician Assistant | Admitting: Occupational Therapy

## 2021-07-05 ENCOUNTER — Encounter: Payer: Self-pay | Admitting: Neurology

## 2021-07-05 ENCOUNTER — Other Ambulatory Visit: Payer: Self-pay

## 2021-07-05 ENCOUNTER — Encounter (HOSPITAL_COMMUNITY): Payer: Self-pay | Admitting: Occupational Therapy

## 2021-07-05 DIAGNOSIS — R278 Other lack of coordination: Secondary | ICD-10-CM | POA: Insufficient documentation

## 2021-07-05 DIAGNOSIS — R29898 Other symptoms and signs involving the musculoskeletal system: Secondary | ICD-10-CM | POA: Insufficient documentation

## 2021-07-05 DIAGNOSIS — I82423 Acute embolism and thrombosis of iliac vein, bilateral: Secondary | ICD-10-CM

## 2021-07-05 LAB — ANCA PROFILE
Anti-MPO Antibodies: <0.2 units (ref 0.0–0.9)
Anti-PR3 Antibodies: <0.2 units (ref 0.0–0.9)
Atypical pANCA: <1:20 {titer}
C-ANCA: <1:20 {titer}
P-ANCA: <1:20 {titer}

## 2021-07-05 NOTE — Addendum Note (Signed)
Addended byWaynetta Pean on: 07/05/2021 11:31 PM   Modules accepted: Orders

## 2021-07-05 NOTE — Addendum Note (Signed)
Addended byWaynetta Pean on: 07/05/2021 11:29 PM   Modules accepted: Orders

## 2021-07-05 NOTE — Patient Instructions (Signed)

## 2021-07-05 NOTE — Therapy (Signed)
[facility] [address] [place], [place], [address] Phone: [phone]   Fax:  [phone]  Occupational Therapy Treatment  Patient Details  Name: Diane Todd MRN: [id number] Date of Birth: [birth year] Referring Provider (OT): Cruzita Lederer, PA-C   Encounter Date: 07/05/2021   OT End of Session - 07/05/21 1508     Visit Number 2    Number of Visits 8    Date for OT Re-Evaluation 07/25/21    Authorization Type [employer]    Authorization Time Period 27 visits combined a year for OT/PT/SLP. 8 visits approved 8/16-10/15/22    Authorization - Visit Number 1    Authorization - Number of Visits 8    OT Start Time 1432    OT Stop Time 1511    OT Time Calculation (min) 39 min    Activity Tolerance Patient tolerated treatment well    Behavior During Therapy WFL for tasks assessed/performed             Past Medical History:  Diagnosis Date   Hypertension    Hypothyroidism     Past Surgical History:  Procedure Laterality Date   IR FLUORO GUIDE CV LINE RIGHT  05/08/2021   IR US GUIDE VASC ACCESS RIGHT  05/08/2021   PERIPHERAL VASCULAR THROMBECTOMY N/A 06/14/2021   Procedure: PERIPHERAL VASCULAR THROMBECTOMY;  Surgeon: Sherren Kerns, MD;  Location: [facility] INVASIVE CV LAB;  Service: Cardiovascular;  Laterality: N/A;    There were no vitals filed for this visit.   Subjective Assessment - 07/05/21 1432     Subjective  S: Everything is going well I think.    Currently in Pain? No/denies                [facility] OT Assessment - 07/05/21 1432       Assessment   Medical Diagnosis UB weakness/decreased coordination      Precautions   Precautions Fall                      OT Treatments/Exercises (OP) - 07/05/21 1434       Exercises   Exercises Hand;Theraputty      Hand Exercises   Hand Gripper with Large Beads all beads gripper at 25#, both hands    Hand Gripper with Medium Beads all beads  gripper at 25#, both hands    Hand Gripper with Small Beads all beads gripper at 25#, both hands      Theraputty   Theraputty - Flatten red-seated, both hands    Theraputty - Roll red, both hands    Theraputty - Pinch red, lateral and 3 point, both hands      Fine Motor Coordination (Hand/Wrist)   Fine Motor Coordination Small Pegboard    Small Pegboard Pt holding small handful of pegs in hand and working on in-hand translation to fingertips and placing in pegboard. Increased time required for task, mod difficulty with in-hand manipulation                    OT Education - 07/05/21 1453     Education Details red theraputty grip and pinch strengthening    Person(s) Educated Patient;Other (comment)   brother   Methods Explanation;Demonstration;Handout    Comprehension Verbalized understanding;Returned demonstration              OT Short Term Goals - 07/05/21 1444       OT SHORT TERM GOAL #1   Title  Patient will be educated and independent with HEP in order to faciliate her progress in therapy and increase her independence with simple meal prep tasks.    Time 4    Period Weeks    Status On-going    Target Date 07/25/21      OT SHORT TERM GOAL #2   Title Patient will increase her bilateral hand strength by 15# or more in order to increase her ability to open tight or new jars and containers with less difficulty.    Time 4    Period Weeks    Status On-going      OT SHORT TERM GOAL #3   Title Patient will increase her left hand coordination by completing the 9 hole peg test in 39 seconds or less.    Time 4    Period Weeks    Status On-going      OT SHORT TERM GOAL #4   Title Pt will be educated on adaptive techniques and/or equipment that may help increase her independence when completing leisure and I/ADL activities.    Time 4    Period Weeks    Status On-going                      Plan - 07/05/21 1455     Clinical Impression Statement A:  Session working on grip strengthening and fine motor coordination tasks today. Pt completing hand gripper activity at 25#, rest breaks provided as needed. Small pegboard task initiated for coordination work. Educated on red theraputty HEP and pt completing activities during session. Verbal cuing for form and technique during activities.    Body Structure / Function / Physical Skills ADL;FMC;Coordination;GMC;IADL;Endurance    Plan P: Follow up on HEP, add pinch task, coin manipulation task    OT Home Exercise Plan 8/19: red theraputty grip/pinch    Consulted and Agree with Plan of Care Patient;Family member/caregiver    Family Member Consulted brother             Patient will benefit from skilled therapeutic intervention in order to improve the following deficits and impairments:   Body Structure / Function / Physical Skills: ADL, FMC, Coordination, GMC, IADL, Endurance       Visit Diagnosis: Other symptoms and signs involving the musculoskeletal system  Other lack of coordination    Problem List Patient Active Problem List   Diagnosis Date Noted   DVT (deep venous thrombosis) (HCC) 06/10/2021   AKI (acute kidney injury) (HCC) 06/10/2021   Neuromyelitis optica (devic) (HCC) 05/17/2021   Protein-calorie malnutrition, severe 05/05/2021   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

## 2021-07-05 NOTE — Addendum Note (Signed)
Addended byWaynetta Pean on: 07/05/2021 11:27 PM   Modules accepted: Orders

## 2021-07-09 ENCOUNTER — Ambulatory Visit (HOSPITAL_COMMUNITY): Payer: Medicaid Other

## 2021-07-09 ENCOUNTER — Encounter (HOSPITAL_COMMUNITY): Payer: Self-pay

## 2021-07-09 ENCOUNTER — Other Ambulatory Visit: Payer: Self-pay

## 2021-07-09 DIAGNOSIS — R262 Difficulty in walking, not elsewhere classified: Secondary | ICD-10-CM | POA: Diagnosis not present

## 2021-07-09 DIAGNOSIS — R278 Other lack of coordination: Secondary | ICD-10-CM

## 2021-07-09 DIAGNOSIS — R29898 Other symptoms and signs involving the musculoskeletal system: Secondary | ICD-10-CM

## 2021-07-09 LAB — ANTI-MOG, SERUM: MOG Antibody, Cell-based IFA: NEGATIVE

## 2021-07-09 NOTE — Therapy (Signed)
[facility] [address] [place], [place], [address] Phone: [phone]   Fax:  [phone]  Occupational Therapy Treatment  Patient Details  Name: Diane Todd MRN: [id number] Date of Birth: 10-22-2001 Referring Provider (OT): Cruzita Lederer, PA-C   Encounter Date: 07/09/2021   OT End of Session - 07/09/21 1007     Visit Number 3    Number of Visits 8    Date for OT Re-Evaluation 07/25/21    Authorization Type [employer]    Authorization Time Period 27 visits combined a year for OT/PT/SLP. 8 visits approved 8/16-10/15/22    Authorization - Visit Number 2    Authorization - Number of Visits 8    OT Start Time 0905    OT Stop Time 0940    OT Time Calculation (min) 35 min    Activity Tolerance Patient tolerated treatment well    Behavior During Therapy [facility] for tasks assessed/performed             Past Medical History:  Diagnosis Date   Hypertension    Hypothyroidism     Past Surgical History:  Procedure Laterality Date   IR FLUORO GUIDE CV LINE RIGHT  05/08/2021   IR US GUIDE VASC ACCESS RIGHT  05/08/2021   PERIPHERAL VASCULAR THROMBECTOMY N/A 06/14/2021   Procedure: PERIPHERAL VASCULAR THROMBECTOMY;  Surgeon: Sherren Kerns, MD;  [facility];  Service: Cardiovascular;  Laterality: N/A;    There were no vitals filed for this visit.   Subjective Assessment - 07/09/21 0912     Subjective  S: Nothing has improved so far.    Patient is accompanied by: Family member   Brother: Junior   Currently in Pain? No/denies    Pain Score 0-No pain                [facility] OT Assessment - 07/09/21 0914       Assessment   Medical Diagnosis UB weakness/decreased coordination      Precautions   Precautions Fall                      OT Treatments/Exercises (OP) - 07/09/21 0914       Exercises   Exercises Hand      Hand Exercises   Hand Gripper with Large Beads all beads  gripper at 25#, both hands - [email or web address] standing    Hand Gripper with Medium Beads all beads gripper at 25#, both hands - vertical. standing    Hand Gripper with Small Beads all beads gripper at 25#, both hands.- verstical. standing      Fine Motor Coordination (Hand/Wrist)   Fine Motor Coordination Grooved pegs    Grooved pegs Grooved pegboard used with left hand. Pt picked up 5 pegs at a time prior to placing pegs, patient focused on transferring pegs from palm to fingertip. Removed pegs by attempting to hold all pegs in her hand without dropping.                    OT Education - 07/09/21 1005     Education Details re-printed theraputty HEP from previous session. Wrote on paper ADL homework for the week. Stand at kitchen sink to brush her teeth (bathroom doorway does not allow for access with wheelchair). When standing do not have left foot planter flexed  with hell off floor to decrease/prevent calf tightness. Instead place left foot further infront to allow foot to be  flat on floor.    Person(s) Educated Patient;Other (comment)   Brother   Methods Explanation;Demonstration;Handout    Comprehension Verbalized understanding;Returned demonstration              OT Short Term Goals - 07/05/21 1444       OT SHORT TERM GOAL #1   Title Patient will be educated and independent with HEP in order to faciliate her progress in therapy and increase her independence with simple meal prep tasks.    Time 4    Period Weeks    Status On-going    Target Date 07/25/21      OT SHORT TERM GOAL #2   Title Patient will increase her bilateral hand strength by 15# or more in order to increase her ability to open tight or new jars and containers with less difficulty.    Time 4    Period Weeks    Status On-going      OT SHORT TERM GOAL #3   Title Patient will increase her left hand coordination by completing the 9 hole peg test in 39 seconds or less.    Time 4    Period Weeks     Status On-going      OT SHORT TERM GOAL #4   Title Pt will be educated on adaptive techniques and/or equipment that may help increase her independence when completing leisure and I/ADL activities.    Time 4    Period Weeks    Status On-going                      Plan - 07/09/21 1007     Clinical Impression Statement A: Focuded session on coordination and grip strengthening goals. Completed standing endurance and tolerance while completing handgripper task with education on how to decrease change of left calf tightness. Encouraged more standing during the day. Able to complete sit to stand, stand to sit and standing task at stand by assist level. VC for form and technique were provided. Slightly less coordinated with left hand during activities versus right.    Body Structure / Function / Physical Skills ADL;FMC;Coordination;GMC;IADL;Endurance    Plan P: Cooking task next session. Focus on activity endurance, task modification, adaptive tools that may help decrease tremors, safety and judgement. Follow up on ADL homework: Standing while brushing teeth.    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 [place], [place], [address] Phone: [phone]   Fax:  [phone]  Name: Neyda Durango MRN: [id number] Date of Birth: 06-May-2001

## 2021-07-10 LAB — NEUROMYELITIS OPTICA AUTOAB, IGG: NMO IgG Autoantibodies: <1.5 U/mL (ref 0.0–3.0)

## 2021-07-11 ENCOUNTER — Encounter (HOSPITAL_COMMUNITY): Payer: Self-pay

## 2021-07-11 ENCOUNTER — Other Ambulatory Visit: Payer: Self-pay

## 2021-07-11 ENCOUNTER — Ambulatory Visit (HOSPITAL_COMMUNITY): Payer: Medicaid Other

## 2021-07-11 DIAGNOSIS — R262 Difficulty in walking, not elsewhere classified: Secondary | ICD-10-CM | POA: Diagnosis not present

## 2021-07-11 DIAGNOSIS — R29898 Other symptoms and signs involving the musculoskeletal system: Secondary | ICD-10-CM

## 2021-07-11 DIAGNOSIS — R278 Other lack of coordination: Secondary | ICD-10-CM

## 2021-07-11 NOTE — Patient Instructions (Signed)
  For writing or fine motor activities weight centered in more of the hand itself may work better.    Right hand did well with 1.5lbs. (wrist weight)

## 2021-07-11 NOTE — Therapy (Signed)
[facility] [address] [place], [place], [address] Phone: [phone]   Fax:  [phone]  Occupational Therapy Treatment  Patient Details  Name: Diane Todd MRN: [id number] Date of Birth: 2001/05/23 Referring Provider (OT): Cruzita Lederer, PA-C   Encounter Date: 07/11/2021   OT End of Session - 07/11/21 1606     Visit Number 4    Number of Visits 8    Date for OT Re-Evaluation 07/25/21    Authorization Type [employer]    Authorization Time Period 27 visits combined a year for OT/PT/SLP. 8 visits approved 8/16-10/15/22    Authorization - Visit Number 3    Authorization - Number of Visits 8    OT Start Time 1312   Pt arrived late   OT Stop Time 1345    OT Time Calculation (min) 33 min    Activity Tolerance Patient tolerated treatment well    Behavior During Therapy [facility] for tasks assessed/performed             Past Medical History:  Diagnosis Date   Hypertension    Hypothyroidism     Past Surgical History:  Procedure Laterality Date   IR FLUORO GUIDE CV LINE RIGHT  05/08/2021   IR US GUIDE VASC ACCESS RIGHT  05/08/2021   PERIPHERAL VASCULAR THROMBECTOMY N/A 06/14/2021   Procedure: PERIPHERAL VASCULAR THROMBECTOMY;  Surgeon: Sherren Kerns, MD;  [facility];  Service: Cardiovascular;  Laterality: N/A;    There were no vitals filed for this visit.   Subjective Assessment - 07/11/21 1337     Subjective  S: I worked on the putty. I didn't do the other homework. I did stand to wash my hands.    Patient is accompanied by: Family member   brother   Currently in Pain? No/denies                          OT Treatments/Exercises (OP) - 07/11/21 1425       ADLs   ADL Comments Assessed benefit of a variety of wrist weights on Bilateral wrists while completing      Exercises   Exercises Hand                    OT Education - 07/11/21 1424     Education  Details possible benefit of 1.5 wrist weight to right hand to decrease tremors and increase coordination. Discussed use of hand based weights (provided picture from [place]) which may assist with fine motor coordination if writing or painting.    Person(s) Educated Patient;Other (comment)   Brother   Methods Explanation;Handout;Demonstration    Comprehension Verbalized understanding;Returned demonstration              OT Short Term Goals - 07/05/21 1444       OT SHORT TERM GOAL #1   Title Patient will be educated and independent with HEP in order to faciliate her progress in therapy and increase her independence with simple meal prep tasks.    Time 4    Period Weeks    Status On-going    Target Date 07/25/21      OT SHORT TERM GOAL #2   Title Patient will increase her bilateral hand strength by 15# or more in order to increase her ability to open tight or new jars and containers with less difficulty.    Time 4    Period Weeks  Status On-going      OT SHORT TERM GOAL #3   Title Patient will increase her left hand coordination by completing the 9 hole peg test in 39 seconds or less.    Time 4    Period Weeks    Status On-going      OT SHORT TERM GOAL #4   Title Pt will be educated on adaptive techniques and/or equipment that may help increase her independence when completing leisure and I/ADL activities.    Time 4    Period Weeks    Status On-going                      Plan - 07/11/21 1607     Clinical Impression Statement A: Due to time constraint from late start, cooking task will be pushed to next OT session. Focused on trialing a variety of wrist weights with both right and left hand to determine if they will help decrease tremors and increase motor control. patient was provided with handout regarding use of wrist weights if she wishes to purchase while also discussing hand based weights for fine motor tasks such writing and/orpainting.    Body Structure /  Function / Physical Skills ADL;FMC;Coordination;GMC;IADL;Endurance    Plan P: Cooking task next session. Focus on activity endurance, task modification, adaptive tools that may help decrease tremors, safety and judgement. Follow up on ADL homework: Standing while brushing teeth.    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 [place], [place], [address] Phone: [phone]   Fax:  [phone]  Name: Diane Todd MRN: [id number] Date of Birth: 12/26/2000

## 2021-07-15 ENCOUNTER — Other Ambulatory Visit: Payer: Self-pay

## 2021-07-15 ENCOUNTER — Ambulatory Visit (HOSPITAL_COMMUNITY): Payer: Medicaid Other | Admitting: Speech Pathology

## 2021-07-15 ENCOUNTER — Encounter (HOSPITAL_COMMUNITY): Payer: Self-pay

## 2021-07-15 ENCOUNTER — Ambulatory Visit (HOSPITAL_COMMUNITY): Payer: Medicaid Other

## 2021-07-15 ENCOUNTER — Encounter (HOSPITAL_COMMUNITY): Payer: Self-pay | Admitting: Speech Pathology

## 2021-07-15 DIAGNOSIS — R41841 Cognitive communication deficit: Secondary | ICD-10-CM

## 2021-07-15 DIAGNOSIS — R262 Difficulty in walking, not elsewhere classified: Secondary | ICD-10-CM | POA: Diagnosis not present

## 2021-07-15 DIAGNOSIS — R2689 Other abnormalities of gait and mobility: Secondary | ICD-10-CM

## 2021-07-15 DIAGNOSIS — M6281 Muscle weakness (generalized): Secondary | ICD-10-CM

## 2021-07-15 DIAGNOSIS — R2681 Unsteadiness on feet: Secondary | ICD-10-CM

## 2021-07-15 NOTE — Therapy (Signed)
[facility] [address] [place], [place], [address] Phone: [phone]   Fax:  [phone]  Physical Therapy Treatment  Patient Details  Name: Diane Todd MRN: [id number] Date of Birth: 2001-01-18 Referring Provider (PT): Lia Hopping   Encounter Date: 07/15/2021   PT End of Session - 07/15/21 0901     Visit Number 2    Number of Visits 12    Date for PT Re-Evaluation 08/08/21    Authorization Type [place] Medicaid [employer], auth required    Authorization - Visit Number 1    Authorization - Number of Visits 3    Progress Note Due on Visit 3    PT Start Time 0902    PT Stop Time [phone]    PT Time Calculation (min) 40 min    Equipment Utilized During Treatment Gait belt    Activity Tolerance Patient limited by pain;Patient limited by fatigue    Behavior During Therapy [facility] for tasks assessed/performed;Anxious             Past Medical History:  Diagnosis Date   Hypertension    Hypothyroidism     Past Surgical History:  Procedure Laterality Date   IR FLUORO GUIDE CV LINE RIGHT  05/08/2021   IR US GUIDE VASC ACCESS RIGHT  05/08/2021   PERIPHERAL VASCULAR THROMBECTOMY N/A 06/14/2021   Procedure: PERIPHERAL VASCULAR THROMBECTOMY;  Surgeon: Sherren Kerns, MD;  [facility];  Service: Cardiovascular;  Laterality: N/A;    There were no vitals filed for this visit.   Subjective Assessment - 07/15/21 0901     Subjective Patient reports still taking blood thinner but doesn't feel like pain in left calf is getting much better. Hasn't really been doing her HEP. Brother present for session to assist with carry over of HEP due to memory difficulties for patient.    Patient is accompained by: Family member    How long can you stand comfortably? 1-2 min    How long can you walk comfortably? unable    Currently in Pain? Yes    Pain Score 5     Pain Location Calf    Pain Orientation Left;Posterior                [facility] Adult PT Treatment/Exercise - 07/15/21 0001       Bed Mobility   Rolling Right Independent    Rolling Left Independent    Supine to Sit Independent    Sit to Supine Independent      Transfers   Transfers Sit to Stand;Stand Pivot Transfers    Sit to Stand 4: Min guard    Stand Pivot Transfers 4: Min guard      Ambulation/Gait   Ambulation/Gait Yes    Ambulation/Gait Assistance 4: Min guard    Ambulation Distance (Feet) 3 Feet    Assistive device Rolling walker    Gait Pattern Step-to pattern;Antalgic;Decreased stance time - left    Gait Comments LLE calf pain limits weight acceptance      Exercises   Exercises Knee/Hip      Knee/Hip Exercises: Stretches   Gastroc Stretch Left;30 seconds;1 rep    Gastroc Stretch Limitations with towel      Knee/Hip Exercises: Seated   Heel Slides AROM;Left;1 set;5 reps    Heel Slides Limitations 10 sec hold      Knee/Hip Exercises: Supine   Quad Sets Strengthening;Left;10 reps;1 set    Quad Sets Limitations 3 sec hold  Straight Leg Raises Strengthening;Left;10 reps;1 set                    PT Education - 07/15/21 0955     Education Details Reviewed initial evaluation, goals and HEP with patient and brother. Advanced HEP. Discussed purpose and technique of interventions throughout session.    Person(s) Educated Patient   brother   Methods Explanation;Handout    Comprehension Verbalized understanding;Verbal cues required;Need further instruction              PT Short Term Goals - 07/15/21 0902       PT SHORT TERM GOAL #1   Title Patient will be independent with HEP in order to improve functional outcomes.    Time 3    Period Weeks    Status On-going    Target Date 07/18/21      PT SHORT TERM GOAL #2   Title Patient will report at least 25% improvement in symptoms for improved quality of life.    Time 3    Period Weeks    Status On-going    Target Date 07/18/21      PT SHORT TERM GOAL #3    Title Demonstrate modified independent functional transfers, with or without AD    Baseline CGA with RW    Time 3    Period Weeks    Status On-going    Target Date 07/18/21      PT SHORT TERM GOAL #4   Title Patient will be able to ambulate level surfaces with supervision and RW x 50 ft to improve functional ambulation    Baseline 3 ft with CGA and RW    Time 3    Period Weeks    Status On-going    Target Date 07/18/21               PT Long Term Goals - 07/15/21 0902       PT LONG TERM GOAL #1   Title Patient will be able to ambulate x 200 ft during using least restrictive AD to manifest improved gait capabilities    Baseline 3 ft with RW and CGA    Time 6    Period Weeks    Status On-going      PT LONG TERM GOAL #2   Title Demo ability to negotiate 5 steps modified independent to enable home entry    Baseline total assist in w/c    Time 6    Period Weeks    Status On-going                   Plan - 07/15/21 0901     Clinical Impression Statement Reviewed initial evaluation, goals and initial HEP. Further instruction needed as patient unable to recall initial HEP. Brother present throughout session. Reviewed calf stretch in long sitting, quad set, and supine straight leg raises. Added seated heel slides for calf stretch. Short distance ambulation from manual wheelchair to mat 3 feet with increased complaints of left calf pain. Cues to use RW and upper extremities for most of weight bearing but attempting to keep foot flat on floor. Patient fatigues quickly and reports 7/10 pain with weight bearing and any exercises that place weight bearing or stretch to left calf. Patient will benefit from skilled physical therapy in order to improve function and reduce impairment.    Personal Factors and Comorbidities Comorbidity 1    Comorbidities PMH    Examination-Activity Limitations Carry;Lift;Toileting;Stand;Stairs;Squat;Reach  Overhead;Locomotion Level;Transfers     Examination-Participation Restrictions Cleaning;Community Activity;Driving;Laundry;Yard Work;Shop;Occupation;Meal Prep    Stability/Clinical Decision Making Evolving/Moderate complexity    Rehab Potential Good    PT Frequency 2x / week    PT Duration 6 weeks    PT Treatment/Interventions ADLs/Self Care Home Management;DME Instruction;Gait training;Stair training;Functional mobility training;Therapeutic activities;Therapeutic exercise;Balance training;Patient/family education;Neuromuscular re-education;Wheelchair mobility training;Manual techniques    PT Next Visit Plan Continue with WBing activities, weight shifting, gait training, bed exercise    PT Home Exercise Plan calf stretch, QS, SLR; 8/30 seat heel slide    Consulted and Agree with Plan of Care Patient             Patient will benefit from skilled therapeutic intervention in order to improve the following deficits and impairments:  Abnormal gait, Decreased activity tolerance, Decreased balance, Decreased mobility, Decreased endurance, Decreased strength, Difficulty walking, Impaired perceived functional ability, Improper body mechanics, Pain  Visit Diagnosis: Difficulty in walking, not elsewhere classified  Muscle weakness (generalized)  Other abnormalities of gait and mobility  Unsteadiness on feet     Problem List Patient Active Problem List   Diagnosis Date Noted   DVT (deep venous thrombosis) (HCC) 06/10/2021   AKI (acute kidney injury) (HCC) 06/10/2021   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 Hartnett-Rands, MS, PT [place] [facility] [place] [phone]  Epifanio Lesches 07/15/2021, 10:10 AM  [facility] [address]  [place], [place], [address] Phone: [phone]   Fax:  [phone]  Name: Diane Todd MRN: [id number] Date of Birth: [birth year]

## 2021-07-15 NOTE — Therapy (Signed)
[facility] [address] [place], [place], [address] Phone: [phone]   Fax:  [phone]  Speech Language Pathology Treatment  Patient Details  Name: Lanitra Battaglini MRN: [id number] Date of Birth: 2001-08-04 Referring Provider (SLP): Mariam Dollar, PA-C   Encounter Date: 07/15/2021   End of Session - 07/15/21 1152     Visit Number 2    Number of Visits 5    Date for SLP Re-Evaluation 08/01/21    Authorization Type [employer]    SLP Start Time 1030    SLP Stop Time  1120    SLP Time Calculation (min) 50 min    Activity Tolerance Patient tolerated treatment well             Past Medical History:  Diagnosis Date   Hypertension    Hypothyroidism     Past Surgical History:  Procedure Laterality Date   IR FLUORO GUIDE CV LINE RIGHT  05/08/2021   IR US GUIDE VASC ACCESS RIGHT  05/08/2021   PERIPHERAL VASCULAR THROMBECTOMY N/A 06/14/2021   Procedure: PERIPHERAL VASCULAR THROMBECTOMY;  Surgeon: Sherren Kerns, MD;  [facility];  Service: Cardiovascular;  Laterality: N/A;    There were no vitals filed for this visit.   Subjective Assessment - 07/15/21 1048     Subjective "I saw the neurologist."    Patient is accompained by: Family member    Special Tests RBANS    Currently in Pain? No/denies              ADULT SLP TREATMENT - 07/15/21 1100       General Information   Behavior/Cognition Alert;Cooperative;Pleasant mood    Patient Positioning Upright in chair    Oral care provided N/A    HPI Patient is a 20 y.o. year old female with history of hypertension, tobacco use, CKD stage III,  as well as thyroid disorder. Presented 05/03/2021 with generalized weakness poor appetite/nausea and vomiting as well as blurred vision x 2 weeks. 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  MRI of the brain showed  hyperintense T2 weighted signal and mild contrast-enhancement within the medial thalami and periaqueductal gray matter.  Demyelinating disease remained a primary consideration. She was admitted to CIR and d/c'd home 06/05/21, but was then re-hospitalized due to other medical complications (DVT). She was referred for OP SLP services to address cognitive communications s/p recent hospitalization.      Treatment Provided   Treatment provided Cognitive-Linquistic      Pain Assessment   Pain Assessment No/denies pain      Cognitive-Linquistic Treatment   Treatment focused on Cognition;Patient/family/caregiver education    Skilled Treatment RBANS administered this date to gain more in depth assessment of cognitive linguistic deficits. Pt also provided with memory strategies to review at home.      Assessment / Recommendations / Plan   Plan Continue with current plan of care      Progression Toward Goals   Progression toward goals Progressing toward goals              SLP Education - 07/15/21 1151     Education Details Provided list of memory strategies, to-do list, and calendar to add to her folder and reminded her to bring next session    Person(s) Educated Patient;Other (comment)   brother   Methods Explanation;Handout    Comprehension Verbalized understanding  SLP Short Term Goals - 07/15/21 1206       SLP SHORT TERM GOAL #1   Title Pt will complete functional memory tasks with 95% acc with use of memory strategies as needed.    Baseline 85%    Time 4    Period Weeks    Status On-going    Target Date 08/12/21      SLP SHORT TERM GOAL #2   Title Pt will complete moderately complex planning and organization tasks with 95% acc with min assist as needed.    Baseline mi/mod assist    Time 4    Period Weeks    Status On-going    Target Date 08/12/21                Plan - 07/15/21 1205     Clinical Impression Statement Pt saw Dr. Epimenio Foot, neurologist,  recently and had additional lab work completed. Per chart review, differential diagnosis includes: thiamine deficiency (Wernicke's encephalopathy) and neuromyelitis optica per Dr. Bonnita Hollow report.  RBANS (Repeatable Battery for the Assessment of Neuropschological Status) administered this session. See results below   Immediate Memory 25%ile Index Score: 90 List Learning 23/40 Story Memory 22/24  Visuospatial/Constructional 25%ile Index Score: 89 Figure Copy 19/20 Line Orientation 15/20  Language 10%ile Index Score: 82 Picture Naming 10/10 Semantic Fluency 15/40  Attention <0.1 %ile Index Score: 49 Digit Span 616 Coding 36/89  Delayed Memory 24%ile Index Score: 88  List Recall 4/10 List Recognition 20/20 Story Recall 11/12 Figure Recall 13/20 Pt with sum of index scores of 398 and Total Scale is 74, 4th%ile    Speech Therapy Frequency 1x /week    Duration 4 weeks    Treatment/Interventions Cueing hierarchy;SLP instruction and feedback;Compensatory techniques;Cognitive reorganization;Patient/family education;Internal/external aids    Potential to Achieve Goals Good    SLP Home Exercise Plan Pt will completed HEP as assigned to facilitate carryover of treatment strategies and techniques in home environment with use of written cues as needed.    Consulted and Agree with Plan of Care Patient             Patient will benefit from skilled therapeutic intervention in order to improve the following deficits and impairments:   Cognitive communication deficit    Problem List Patient Active Problem List   Diagnosis Date Noted   DVT (deep venous thrombosis) (HCC) 06/10/2021   AKI (acute kidney injury) (HCC) 06/10/2021   Neuromyelitis optica (devic) (HCC) 05/17/2021   Protein-calorie malnutrition, severe 05/05/2021   Optic neuritis 05/04/2021   Hypothyroidism 05/04/2021   Renal insufficiency 05/04/2021   Helicobacter pylori gastritis 04/18/2021   History of hypertension  04/18/2021   Thank you,  Havery Moros, CCC-SLP [phone]  Dasiah Hooley 07/15/2021, 12:08 PM  [facility] [address] [place], [place], [address] Phone: [phone]   Fax:  [phone]   Name: Madilynn Montante MRN: [id number] Date of Birth: 2001/09/17

## 2021-07-15 NOTE — Patient Instructions (Addendum)
Quad Set: Slight Flexion    Tense muscles on top of left thigh. Hold _3-5___ seconds. Repeat _10-15___ times per set. Do _1___ sets per session. Do _1___ sessions per day.  http://orth.exer.us/679   Copyright  VHI. All rights reserved.   Straight Leg Raise    Bend one leg. Raise other leg _9-12___ inches with knee locked. Exhale and tighten thigh muscles while raising leg. Repeat with other leg. Repeat _10-15___ times. Do _1___ sessions per day.  http://gt2.exer.us/270   Copyright  VHI. All rights reserved.   Calf Stretch    Con una toalla alrededor de la planta del pie, mantenga la rodilla extendida y hale la toalla hacia usted hasta sentir un estiramiento en la pantorrilla. Sostenga 30__ segundos. Repita 10-15___ veces. Realice _1__ sesiones por da.  Copyright  VHI. All rights reserved.   Heel Slides    Sentada with towel under pierna. Contraiga el piso plvico y Consulting civil [employer]. Deslice el taln izquierdo [employer]. Sostenga durante _10__ segundos. Deslcelo de nuevo a la posicin de rodilla extendida. Repita _10__ veces. Realice _1__ veces al da. Repita con la otra pierna.   Copyright  VHI. All rights reserved.

## 2021-07-16 ENCOUNTER — Encounter
Payer: Medicaid Other | Attending: Physical Medicine and Rehabilitation | Admitting: Physical Medicine and Rehabilitation

## 2021-07-16 ENCOUNTER — Encounter: Payer: Self-pay | Admitting: Physical Medicine and Rehabilitation

## 2021-07-16 VITALS — BP 113/80 | HR 120 | Temp 99.0°F

## 2021-07-16 DIAGNOSIS — M79606 Pain in leg, unspecified: Secondary | ICD-10-CM | POA: Insufficient documentation

## 2021-07-16 DIAGNOSIS — G360 Neuromyelitis optica [Devic]: Secondary | ICD-10-CM | POA: Diagnosis present

## 2021-07-16 DIAGNOSIS — I824Z9 Acute embolism and thrombosis of unspecified deep veins of unspecified distal lower extremity: Secondary | ICD-10-CM | POA: Insufficient documentation

## 2021-07-16 DIAGNOSIS — G4700 Insomnia, unspecified: Secondary | ICD-10-CM | POA: Insufficient documentation

## 2021-07-16 DIAGNOSIS — R000 Tachycardia, unspecified: Secondary | ICD-10-CM | POA: Diagnosis present

## 2021-07-16 NOTE — Progress Notes (Signed)
Subjective:    Patient ID: Diane Todd, female    DOB: Jul 07, 2001, 20 y.o.   MRN: [id number]  HPI Diane Todd is a 20 year old woman who presents for follow-up of neuromyelitis optica.   -Her swelling in her legs have improved   -Not sure how her pulse has been home. It is elevated to 120 in the office.   -She has outpatient therapy therapy  -brother has been trying to get more mobile. She has been standing to use the bathroom by herself.      Pain Inventory Average Pain 0 Pain Right Now 0 My pain is  no pain  LOCATION OF PAIN  no pain  BOWEL Number of stools per week: 6  BLADDER Normal  Mobility ability to climb steps?  no do you drive?  no use a wheelchair needs help with transfers Do you have any goals in this area?  yes  Function disabled: date disabled 02/27/2021  Neuro/Psych tremor tingling trouble walking  Prior Studies Any changes since last visit?  no  Physicians involved in your care Any changes since last visit?  no   Family History  Problem Relation Age of Onset   Hypertension Mother    Diabetes Mellitus II Other    Social History   Socioeconomic History   Marital status: Single    Spouse name: Not on file   Number of children: 0   Years of education: 13   Highest education level: Not on file  Occupational History   Not on file  Tobacco Use   Smoking status: Former    Types: E-cigarettes   Smokeless tobacco: Never  Vaping Use   Vaping Use: Some days   Substances: Nicotine, Flavoring  Substance and Sexual Activity   Alcohol use: Not Currently   Drug use: Not Currently   Sexual activity: Not Currently  Other Topics Concern   Not on file  Social History Narrative   Right handed   Caffeine use: soda (1 per day)   Lives with parents   Social Determinants of Health   Financial Resource Strain: Not on file  Food Insecurity: Not on file  Transportation Needs: Not on file  Physical Activity: Not on file  Stress:  Not on file  Social Connections: Not on file   Past Surgical History:  Procedure Laterality Date   IR FLUORO GUIDE CV LINE RIGHT  05/08/2021   IR US GUIDE VASC ACCESS RIGHT  05/08/2021   PERIPHERAL VASCULAR THROMBECTOMY N/A 06/14/2021   Procedure: PERIPHERAL VASCULAR THROMBECTOMY;  Surgeon: Sherren Kerns, MD;  [facility];  Service: Cardiovascular;  Laterality: N/A;   Past Medical History:  Diagnosis Date   Hypertension    Hypothyroidism    BP 113/80   Pulse (!) 120   Temp 99 F (37.2 C)   SpO2 97%   Opioid Risk Score:   Fall Risk Score:  `1  Depression screen PHQ 2/9  Depression screen PHQ 2/9 06/10/2021  Decreased Interest 1  Down, Depressed, Hopeless 1  PHQ - 2 Score 2  Altered sleeping 0  Tired, decreased energy 2  Change in appetite 0  Feeling bad or failure about yourself  2  Trouble concentrating 0  Moving slowly or fidgety/restless 0  Suicidal thoughts 0  PHQ-9 Score 6  Difficult doing work/chores Not difficult at all    Review of Systems  Constitutional: Negative.   HENT: Negative.    Eyes: Negative.   Respiratory: Negative.  Cardiovascular: Negative.   Gastrointestinal: Negative.   Endocrine: Negative.   Musculoskeletal:  Positive for gait problem.  Allergic/Immunologic: Negative.   Neurological:  Positive for tremors.       Tingling   Hematological: Negative.   Psychiatric/Behavioral: Negative.    All other systems reviewed and are negative.     Objective:   Physical Exam  Gen: no distress, normal appearing HEENT: oral mucosa pink and moist, NCAT Cardio: Reg rate Chest: normal effort, normal rate of breathing Abd: soft, non-distended Ext: no edema Psych: pleasant, normal affect Skin: intact Neuro: Alert ans oriented x3 Musculoskeletal: WC bound    Assessment & Plan:  [name] is a 20 year old woman who presents for follow-up after CIR admission for NMO  1) NMO -continue following with Dr.  Epimenio Foot -brother was unable to attend this appointment, so I reviewed Dr. Bonnita Hollow note with them -continue PT and OT -discussed that she will likely need left AFO- will order when she needs -recommended dance therapy for movement  2) DVT -continue Eliquis for total of three months from onset -discussed that immobility was likely a risk factor -recommended exercising arms and limbs every hour  3) Insomnia: improved- discontinue melatonin  4) Pain: resolved: d/c tramadol, Norco  5) Tachycardia: HR up to 120: continue propanool

## 2021-07-17 ENCOUNTER — Ambulatory Visit (HOSPITAL_COMMUNITY): Payer: Medicaid Other | Admitting: Occupational Therapy

## 2021-07-17 ENCOUNTER — Encounter (HOSPITAL_COMMUNITY): Payer: Self-pay | Admitting: Physical Therapy

## 2021-07-17 ENCOUNTER — Encounter (HOSPITAL_COMMUNITY): Payer: Self-pay | Admitting: Occupational Therapy

## 2021-07-17 ENCOUNTER — Other Ambulatory Visit: Payer: Self-pay

## 2021-07-17 ENCOUNTER — Ambulatory Visit (HOSPITAL_COMMUNITY): Payer: Medicaid Other | Admitting: Physical Therapy

## 2021-07-17 DIAGNOSIS — R2681 Unsteadiness on feet: Secondary | ICD-10-CM

## 2021-07-17 DIAGNOSIS — M6281 Muscle weakness (generalized): Secondary | ICD-10-CM

## 2021-07-17 DIAGNOSIS — R29898 Other symptoms and signs involving the musculoskeletal system: Secondary | ICD-10-CM

## 2021-07-17 DIAGNOSIS — R262 Difficulty in walking, not elsewhere classified: Secondary | ICD-10-CM

## 2021-07-17 DIAGNOSIS — R2689 Other abnormalities of gait and mobility: Secondary | ICD-10-CM

## 2021-07-17 DIAGNOSIS — R278 Other lack of coordination: Secondary | ICD-10-CM

## 2021-07-17 NOTE — Therapy (Signed)
[facility] [address] [place], [place], [address] Phone: [phone]   Fax:  [phone]  Occupational Therapy Treatment  Patient Details  Name: Diane Todd MRN: [id number] Date of Birth: Mar 28, 2001 Referring Provider (OT): Cruzita Lederer, PA-C   Encounter Date: 07/17/2021   OT End of Session - 07/17/21 1625     Visit Number 5    Number of Visits 8    Date for OT Re-Evaluation 07/25/21    Authorization Type [employer]    Authorization Time Period 27 visits combined a year for OT/PT/SLP. 8 visits approved 8/16-10/15/22    Authorization - Visit Number 4    Authorization - Number of Visits 8    OT Start Time 1258    OT Stop Time 1344    OT Time Calculation (min) 46 min    Activity Tolerance Patient tolerated treatment well    Behavior During Therapy WFL for tasks assessed/performed             Past Medical History:  Diagnosis Date   Hypertension    Hypothyroidism     Past Surgical History:  Procedure Laterality Date   IR FLUORO GUIDE CV LINE RIGHT  05/08/2021   IR US GUIDE VASC ACCESS RIGHT  05/08/2021   PERIPHERAL VASCULAR THROMBECTOMY N/A 06/14/2021   Procedure: PERIPHERAL VASCULAR THROMBECTOMY;  Surgeon: Sherren Kerns, MD;  [facility];  Service: Cardiovascular;  Laterality: N/A;    There were no vitals filed for this visit.   Subjective Assessment - 07/17/21 1601     Subjective  S: I used to cook and bake a lot.    Patient is accompanied by: Family member   brother   Currently in Pain? No/denies                [facility] OT Assessment - 07/17/21 1601       Assessment   Medical Diagnosis UB weakness/decreased coordination      Precautions   Precautions Fall                      OT Treatments/Exercises (OP) - 07/17/21 1602       Transfers   Sit to Stand 4: Min guard      ADLs   Cooking Session focusing on cooking tasks today. Pt completing a apple pie  bite recipe today involving opening items, measuring ingredients, slicing an apple, unrolling cresent roll and laying on pan, layer ingredients onto cresent roll, then rolling apple slice up into cresent roll. Pt working on bilateral integration and fine motor coordination tasks with meal prep, occasional tremoring noted however pt became more comfortable during task and was able to complete steps with increased time. OT assisting with putting pan into and taking out of oven. Adaptive cutting board utilized to stabilize apple as pt did have difficulty holding stable and avoiding fingers with knife. While bites were in the oven, pt standing at sink for washing dishes, standing for ~5-6 minutes without rest break.                      OT Short Term Goals - 07/05/21 1444       OT SHORT TERM GOAL #1   Title Patient will be educated and independent with HEP in order to faciliate her progress in therapy and increase her independence with simple meal prep tasks.    Time 4    Period Weeks  Status On-going    Target Date 07/25/21      OT SHORT TERM GOAL #2   Title Patient will increase her bilateral hand strength by 15# or more in order to increase her ability to open tight or new jars and containers with less difficulty.    Time 4    Period Weeks    Status On-going      OT SHORT TERM GOAL #3   Title Patient will increase her left hand coordination by completing the 9 hole peg test in 39 seconds or less.    Time 4    Period Weeks    Status On-going      OT SHORT TERM GOAL #4   Title Pt will be educated on adaptive techniques and/or equipment that may help increase her independence when completing leisure and I/ADL activities.    Time 4    Period Weeks    Status On-going                      Plan - 07/17/21 1625     Clinical Impression Statement A: Session focusing on cooking task today. Pt working on meal preparation and cleanup, using BUE for completion. Pt with  occasional tremoring, no weights used during task and pt was able to complete with increased time. OT only assisting by setting out ingredients and putting pan in and taking out of oven. Pt reports she felt more confident with adaptive cutting board. Pt reports she has been standing for washing hands and brushing teeth.    Body Structure / Function / Physical Skills ADL;FMC;Coordination;GMC;IADL;Endurance    Plan P: Follow up on cooking at home. Grip strengthening, fine motor coordination activity    OT Home Exercise Plan 8/19: red theraputty grip/pinch    Consulted and Agree with Plan of Care Patient             Patient will benefit from skilled therapeutic intervention in order to improve the following deficits and impairments:   Body Structure / Function / Physical Skills: ADL, FMC, Coordination, GMC, IADL, Endurance       Visit Diagnosis: Other symptoms and signs involving the musculoskeletal system  Other lack of coordination    Problem List Patient Active Problem List   Diagnosis Date Noted   DVT (deep venous thrombosis) (HCC) 06/10/2021   AKI (acute kidney injury) (HCC) 06/10/2021   Neuromyelitis optica (devic) (HCC) 05/17/2021   Protein-calorie malnutrition, severe 05/05/2021   Optic neuritis 05/04/2021   Hypothyroidism 05/04/2021   Renal insufficiency 05/04/2021   Helicobacter pylori gastritis 04/18/2021   History of hypertension 04/18/2021    Ezra Sites, OTR/L  [phone] 07/17/2021, 4:28 PM  [facility] [address] [place], [place], [address] Phone: [phone]   Fax:  [phone]  Name: Diane Todd MRN: [id number] Date of Birth: 25-Jun-2001

## 2021-07-17 NOTE — Therapy (Signed)
[facility] [address] [place], [place], [address] Phone: [phone]   Fax:  [phone]  Physical Therapy Treatment  Patient Details  Name: Diane Todd MRN: [id number] Date of Birth: 2001-07-23 Referring Provider (PT): Lia Hopping   Encounter Date: 07/17/2021   PT End of Session - 07/17/21 1140     Visit Number 3    Number of Visits 12    Date for PT Re-Evaluation 08/08/21    Authorization Type [place] Medicaid [employer], auth required    Authorization Time Period 12 visits approved from 8/11-10/10    Authorization - Visit Number 2    Authorization - Number of Visits 12    PT Start Time 1140   arrives late   PT Stop Time 1215    PT Time Calculation (min) 35 min    Equipment Utilized During Treatment Gait belt    Activity Tolerance Patient limited by pain;Patient limited by fatigue    Behavior During Therapy [facility] for tasks assessed/performed;Anxious             Past Medical History:  Diagnosis Date   Hypertension    Hypothyroidism     Past Surgical History:  Procedure Laterality Date   IR FLUORO GUIDE CV LINE RIGHT  05/08/2021   IR US GUIDE VASC ACCESS RIGHT  05/08/2021   PERIPHERAL VASCULAR THROMBECTOMY N/A 06/14/2021   Procedure: PERIPHERAL VASCULAR THROMBECTOMY;  Surgeon: Sherren Kerns, MD;  [facility];  Service: Cardiovascular;  Laterality: N/A;    There were no vitals filed for this visit.   Subjective Assessment - 07/17/21 1141     Subjective Patient states about the same. her home exercises are going well.    Patient is accompained by: Family member    How long can you stand comfortably? 1-2 min    How long can you walk comfortably? unable    Currently in Pain? No/denies                               [facility] Adult PT Treatment/Exercise - 07/17/21 0001       Transfers   Sit to Stand 4: Min guard    Stand Pivot Transfers 4: Min guard      Ambulation/Gait    Ambulation/Gait Yes    Ambulation/Gait Assistance 4: Min guard    Ambulation Distance (Feet) 10 Feet    Assistive device Rolling walker    Gait Pattern Step-to pattern;Antalgic;Decreased stance time - left    Gait Comments LLE calf pain limits weight acceptance      Knee/Hip Exercises: Stretches   Gastroc Stretch Left;3 reps;30 seconds    Gastroc Stretch Limitations with towel      Knee/Hip Exercises: Seated   Long Arc Quad Both;2 sets;5 reps      Knee/Hip Exercises: Supine   Quad Sets Strengthening;Left;10 reps;1 set    Heel Slides Both;10 reps    Hip Adduction Isometric 10 reps    Hip Adduction Isometric Limitations 5 second holds with ball    Bridges 10 reps    Straight Leg Raises Strengthening;Left;10 reps;1 set    Other Supine Knee/Hip Exercises hip abd isometric 10 x 5 second holds with ball                    PT Education - 07/17/21 1141     Education Details HEP    Person(s) Educated Patient  Methods Explanation    Comprehension Verbalized understanding              PT Short Term Goals - 07/15/21 0902       PT SHORT TERM GOAL #1   Title Patient will be independent with HEP in order to improve functional outcomes.    Time 3    Period Weeks    Status On-going    Target Date 07/18/21      PT SHORT TERM GOAL #2   Title Patient will report at least 25% improvement in symptoms for improved quality of life.    Time 3    Period Weeks    Status On-going    Target Date 07/18/21      PT SHORT TERM GOAL #3   Title Demonstrate modified independent functional transfers, with or without AD    Baseline CGA with RW    Time 3    Period Weeks    Status On-going    Target Date 07/18/21      PT SHORT TERM GOAL #4   Title Patient will be able to ambulate level surfaces with supervision and RW x 50 ft to improve functional ambulation    Baseline 3 ft with CGA and RW    Time 3    Period Weeks    Status On-going    Target Date 07/18/21                PT Long Term Goals - 07/15/21 0902       PT LONG TERM GOAL #1   Title Patient will be able to ambulate x 200 ft during using least restrictive AD to manifest improved gait capabilities    Baseline 3 ft with RW and CGA    Time 6    Period Weeks    Status On-going      PT LONG TERM GOAL #2   Title Demo ability to negotiate 5 steps modified independent to enable home entry    Baseline total assist in w/c    Time 6    Period Weeks    Status On-going                   Plan - 07/17/21 1141     Clinical Impression Statement Patient able to transfer/ ambulate to/from [facility] to table with RW and min g for safety. Patient completes SLR in small range secondary to impaired LE strength bilateral. Patient tolerates addition of hip strengthening exercises well but requires frequent rest breaks for fatigue. Patient able to ambulate increased distance today with use of RW. Patient will continue to benefit from skilled physical therapy in order to reduce impairment and improve function.    Personal Factors and Comorbidities Comorbidity 1    Comorbidities PMH    Examination-Activity Limitations Carry;Lift;Toileting;Stand;Stairs;Squat;Reach Overhead;Locomotion Level;Transfers    Examination-Participation Restrictions Cleaning;Community Activity;Driving;Laundry;Yard Work;Shop;Occupation;Meal Prep    Stability/Clinical Decision Making Evolving/Moderate complexity    Rehab Potential Good    PT Frequency 2x / week    PT Duration 6 weeks    PT Treatment/Interventions ADLs/Self Care Home Management;DME Instruction;Gait training;Stair training;Functional mobility training;Therapeutic activities;Therapeutic exercise;Balance training;Patient/family education;Neuromuscular re-education;Wheelchair mobility training;Manual techniques    PT Next Visit Plan Continue with WBing activities, weight shifting, gait training, bed exercise    PT Home Exercise Plan calf stretch, QS, SLR; 8/30 seat heel  slide 8/31 bridge, hip abd/ add isometrics    Consulted and Agree with Plan of Care Patient  Patient will benefit from skilled therapeutic intervention in order to improve the following deficits and impairments:  Abnormal gait, Decreased activity tolerance, Decreased balance, Decreased mobility, Decreased endurance, Decreased strength, Difficulty walking, Impaired perceived functional ability, Improper body mechanics, Pain  Visit Diagnosis: Difficulty in walking, not elsewhere classified  Muscle weakness (generalized)  Other abnormalities of gait and mobility  Unsteadiness on feet     Problem List Patient Active Problem List   Diagnosis Date Noted   DVT (deep venous thrombosis) (HCC) 06/10/2021   AKI (acute kidney injury) (HCC) 06/10/2021   Neuromyelitis optica (devic) (HCC) 05/17/2021   Protein-calorie malnutrition, severe 05/05/2021   Optic neuritis 05/04/2021   Hypothyroidism 05/04/2021   Renal insufficiency 05/04/2021   Helicobacter pylori gastritis 04/18/2021   History of hypertension 04/18/2021    12:20 PM, 07/17/21 Wyman Songster PT, DPT Physical Therapist at [facility]    [facility] [address] [place], [place], [address] Phone: [phone]   Fax:  [phone]  Name: Zakiah Gauthreaux MRN: [id number] Date of Birth: 2001/04/14

## 2021-07-17 NOTE — Patient Instructions (Signed)
Access Code: [email or web address] URL: [email or web address] Date: 07/17/2021 Prepared by: Greig Castilla Martino Tompson  Exercises Supine Bridge - 1 x daily - 7 x weekly - 2 sets - 10 reps Hooklying Isometric Hip Abduction with Belt - 1 x daily - 7 x weekly - 10 reps - 5 second hold Supine Hip Adduction Isometric with Ball - 1 x daily - 7 x weekly - 10 reps - 10 second hold

## 2021-07-18 ENCOUNTER — Ambulatory Visit (HOSPITAL_COMMUNITY)
Admission: RE | Admit: 2021-07-18 | Discharge: 2021-07-18 | Disposition: A | Discharge status: Home / Self Care | Payer: Medicaid Other | Source: Ambulatory Visit | Attending: Vascular Surgery | Admitting: Vascular Surgery

## 2021-07-18 ENCOUNTER — Ambulatory Visit (INDEPENDENT_AMBULATORY_CARE_PROVIDER_SITE_OTHER): Payer: Medicaid Other | Admitting: Physician Assistant

## 2021-07-18 ENCOUNTER — Ambulatory Visit (INDEPENDENT_AMBULATORY_CARE_PROVIDER_SITE_OTHER)
Admission: RE | Admit: 2021-07-18 | Discharge: 2021-07-18 | Disposition: A | Discharge status: Home / Self Care | Payer: Medicaid Other | Source: Ambulatory Visit | Attending: Vascular Surgery | Admitting: Vascular Surgery

## 2021-07-18 VITALS — BP 119/79 | HR 106 | Temp 97.9°F | Resp 20 | Ht 67.0 in | Wt 214.0 lb

## 2021-07-18 DIAGNOSIS — I82423 Acute embolism and thrombosis of iliac vein, bilateral: Secondary | ICD-10-CM | POA: Diagnosis not present

## 2021-07-18 DIAGNOSIS — M7989 Other specified soft tissue disorders: Secondary | ICD-10-CM

## 2021-07-18 NOTE — Progress Notes (Signed)
Office Note     CC:  follow up Requesting Provider:  Toma Deiters, MD  HPI: Diane Todd is a 20 y.o. ([birth year]) female who presents for follow-up of bilateral DVT. She underwent ultrasound left popliteal vein, left popliteal venous access, intravascular ultrasound left popliteal superficial femoral common femoral external iliac common iliac inferior vena cava, left iliac and inferior venacavogram, angioplasty left common iliac vein, mechanical thrombectomy (Inari) inferior vena cava left common iliac left external iliac left common femoral left superficial femoral left popliteal vein on 06/14/2021 by Dr. Darrick Penna. Findings were consistent with a large amount of thrombus extending from the distal inferior vena cava all the way to the popliteal vein including the profunda vein. This was most likely due to recent prolonged hospitalization.  She was transisitioned off of heparin and started on apixaban with recommendations for a course of therapy for 6 months.  She arrives in a WC and complains of left calf pain with weight-bearing. She is getting outpatient PT. She does not wear her thigh high compression stocking due to binding at the upper thigh. She is taking her Eliquis as directed.  Past Medical History:  Diagnosis Date   Hypertension    Hypothyroidism     Past Surgical History:  Procedure Laterality Date   IR FLUORO GUIDE CV LINE RIGHT  05/08/2021   IR US GUIDE VASC ACCESS RIGHT  05/08/2021   PERIPHERAL VASCULAR THROMBECTOMY N/A 06/14/2021   Procedure: PERIPHERAL VASCULAR THROMBECTOMY;  Surgeon: Sherren Kerns, MD;  Location: [facility];  Service: Cardiovascular;  Laterality: N/A;    Social History   Socioeconomic History   Marital status: Single    Spouse name: Not on file   Number of children: 0   Years of education: 13   Highest education level: Not on file  Occupational History   Not on file  Tobacco Use   Smoking status: Former    Types: E-cigarettes    Smokeless tobacco: Never  Vaping Use   Vaping Use: Some days   Substances: Nicotine, Flavoring  Substance and Sexual Activity   Alcohol use: Not Currently   Drug use: Not Currently   Sexual activity: Not Currently  Other Topics Concern   Not on file  Social History Narrative   Right handed   Caffeine use: soda (1 per day)   Lives with parents   Social Determinants of Health   Financial Resource Strain: Not on file  Food Insecurity: Not on file  Transportation Needs: Not on file  Physical Activity: Not on file  Stress: Not on file  Social Connections: Not on file  Intimate Partner Violence: Not on file   Family History  Problem Relation Age of Onset   Hypertension Mother    Diabetes Mellitus II Other     Current Outpatient Medications  Medication Sig Dispense Refill   apixaban (ELIQUIS) 5 MG TABS tablet Take 1 tablet (5 mg total) by mouth 2 (two) times daily. 60 tablet 0   apixaban (ELIQUIS) 5 MG TABS tablet Take 2 tablets (10 mg total) by mouth 2 (two) times daily. 26 tablet 0   levothyroxine (SYNTHROID) 100 MCG tablet Take 1 tablet (100 mcg total) by mouth daily. 30 tablet 0   propranolol (INDERAL) 10 MG tablet Take 1 tablet (10 mg total) by mouth daily. 30 tablet 0   thiamine 100 MG tablet Take 1 tablet (100 mg total) by mouth daily. 30 tablet 3   No current facility-administered medications for this visit.  No Known Allergies   REVIEW OF SYSTEMS:   [X]  denotes positive finding, [ ]  denotes negative finding Cardiac  Comments:  Chest pain or chest pressure:    Shortness of breath upon exertion:    Short of breath when lying flat:    Irregular heart rhythm:        Vascular    Pain in calf, thigh, or hip brought on by ambulation: x   Pain in feet at night that wakes you up from your sleep:     Blood clot in your veins: x   Leg swelling:         Pulmonary    Oxygen at home:    Productive cough:     Wheezing:         Neurologic    Sudden weakness in  arms or legs:     Sudden numbness in arms or legs:     Sudden onset of difficulty speaking or slurred speech:    Temporary loss of vision in one eye:     Problems with dizziness:         Gastrointestinal    Blood in stool:     Vomited blood:         Genitourinary    Burning when urinating:     Blood in urine:        Psychiatric    Major depression:         Hematologic    Bleeding problems:    Problems with blood clotting too easily:        Skin    Rashes or ulcers:        Constitutional    Fever or chills:      PHYSICAL EXAMINATION:  Vitals:   07/18/21 0846  BP: 119/79  Pulse: (!) 106  Resp: 20  Temp: 97.9 F (36.6 C)  SpO2: 98%     General:  WDWN in NAD; vital signs documented above Gait: Not observed HENT: WNL, normocephalic Pulmonary: normal non-labored breathing  Cardiac: regular HR,  Skin: without rashes Vascular Exam/Pulses: 2+ bilateral DP pulses Extremities: without ischemic changes, without Gangrene , without cellulitis; without open wounds;  Musculoskeletal: no muscle wasting or atrophy; 5/5 plantar flexion/dorsiflexion strength. Calf is soft.  Neurologic: A&O X 3;  No focal weakness or paresthesias are detected Psychiatric:  The pt has Normal affect.   Non-Invasive Vascular Imaging:   07/18/2021 Venous duplex: Summary:  IVC/Iliac: No evidence of thrombus in IVC and Iliac veins. Visualization  of proximal common Iliac was limited.     *See table(s) above for measurements and observations.    Preliminary    RIGHT:  - No evidence of common femoral vein obstruction.     LEFT:  - Findings consistent with chronic deep vein thrombosis involving the left  posterior tibial veins, and left peroneal veins.  - Findings appear improved from previous examination.  - There is no evidence of superficial venous thrombosis.     - No cystic structure found in the popliteal fossa.      *See table(s) above for measurements and observations.        Preliminary    ASSESSMENT/PLAN:: 20 y.o. female here for follow up for LLE DVT with mechanical thrombectomy and venous angioplasty 06/14/2021.  Left calf pain with weight-bearing. No LE edema. 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  Fitted for knee-high compression stocking today and given written "healthy vein" information. Continue PT. Continue  apixaban through at least January 2023 Follow-up 5 months with Dr. Edilia Bo to discuss apixaban therapy. Advised to proceed to ED should she develop LE swelling/pain.  Milinda Antis, PA-C [facility] [phone]  Clinic MD:   Edilia Bo

## 2021-07-19 ENCOUNTER — Ambulatory Visit (HOSPITAL_COMMUNITY): Payer: Medicaid Other | Attending: Physician Assistant | Admitting: Occupational Therapy

## 2021-07-19 ENCOUNTER — Encounter (HOSPITAL_COMMUNITY): Payer: Self-pay | Admitting: Occupational Therapy

## 2021-07-19 ENCOUNTER — Other Ambulatory Visit: Payer: Self-pay

## 2021-07-19 DIAGNOSIS — R2681 Unsteadiness on feet: Secondary | ICD-10-CM | POA: Diagnosis present

## 2021-07-19 DIAGNOSIS — R262 Difficulty in walking, not elsewhere classified: Secondary | ICD-10-CM | POA: Insufficient documentation

## 2021-07-19 DIAGNOSIS — R29898 Other symptoms and signs involving the musculoskeletal system: Secondary | ICD-10-CM | POA: Insufficient documentation

## 2021-07-19 DIAGNOSIS — R2689 Other abnormalities of gait and mobility: Secondary | ICD-10-CM | POA: Insufficient documentation

## 2021-07-19 DIAGNOSIS — R278 Other lack of coordination: Secondary | ICD-10-CM | POA: Diagnosis present

## 2021-07-19 DIAGNOSIS — M6281 Muscle weakness (generalized): Secondary | ICD-10-CM | POA: Insufficient documentation

## 2021-07-19 DIAGNOSIS — R41841 Cognitive communication deficit: Secondary | ICD-10-CM | POA: Insufficient documentation

## 2021-07-19 NOTE — Therapy (Signed)
[facility] [address] [place], [place], [address] Phone: [phone]   Fax:  [phone]  Occupational Therapy Treatment  Patient Details  Name: Diane Todd MRN: [id number] Date of Birth: Jul 29, 2001 Referring Provider (OT): Cruzita Lederer, PA-C   Encounter Date: 07/19/2021   OT End of Session - 07/19/21 1422     Visit Number 6    Number of Visits 8    Date for OT Re-Evaluation 07/25/21    Authorization Type [employer]    Authorization Time Period 27 visits combined a year for OT/PT/SLP. 8 visits approved 8/16-10/15/22    Authorization - Visit Number 5    Authorization - Number of Visits 8    OT Start Time 1342    OT Stop Time 1425    OT Time Calculation (min) 43 min    Activity Tolerance Patient tolerated treatment well    Behavior During Therapy WFL for tasks assessed/performed             Past Medical History:  Diagnosis Date   Hypertension    Hypothyroidism     Past Surgical History:  Procedure Laterality Date   IR FLUORO GUIDE CV LINE RIGHT  05/08/2021   IR US GUIDE VASC ACCESS RIGHT  05/08/2021   PERIPHERAL VASCULAR THROMBECTOMY N/A 06/14/2021   Procedure: PERIPHERAL VASCULAR THROMBECTOMY;  Surgeon: Sherren Kerns, MD;  Location: [facility];  Service: Cardiovascular;  Laterality: N/A;    There were no vitals filed for this visit.   Subjective Assessment - 07/19/21 1342     Subjective  S: I haven't cooked yet.    Patient is accompanied by: Family member   brother   Currently in Pain? No/denies                [facility] OT Assessment - 07/19/21 1342       Assessment   Medical Diagnosis UB weakness/decreased coordination      Precautions   Precautions Fall                      OT Treatments/Exercises (OP) - 07/19/21 1344       Exercises   Exercises Hand;Shoulder      Shoulder Exercises: Therapy Ball   Other Therapy Ball Exercises Proximal shoulder  strengthening: holding basketball and wearing 1# wrist weights, pt completing protraction, flexion, overhead press, and circles each direction. 10X each      Hand Exercises   Hand Gripper with Medium Beads all beads gripper at 35# for left hand; 37# for right hand, vertical    Hand Gripper with Small Beads all beads gripper at 29#, both hands.- vertical. standing    Other Hand Exercises peg separation: pt working on separating large pegs with thumb and index finger while holding remainder with digits, completed 5 pegs with each hand.      Fine Motor Coordination (Hand/Wrist)   Fine Motor Coordination Flipping cards;Manipulating coins    Flipping cards Pt holding one card in each hand, rotating 2x then flipping cards 2x, completed 5X. Increased time to complete simultaneously    Manipulating coins Pt holding coins in each palm and working on translating from palm to fingertips then placing into slotted container. Mod difficulty with left hand, min difficulty with right hand.                    OT Education - 07/19/21 1409     Education Details therapy ball  shoulder strengthening    Person(s) Educated Patient;Other (comment)   brother   Methods Explanation;Handout;Demonstration    Comprehension Verbalized understanding;Returned demonstration              OT Short Term Goals - 07/05/21 1444       OT SHORT TERM GOAL #1   Title Patient will be educated and independent with HEP in order to faciliate her progress in therapy and increase her independence with simple meal prep tasks.    Time 4    Period Weeks    Status On-going    Target Date 07/25/21      OT SHORT TERM GOAL #2   Title Patient will increase her bilateral hand strength by 15# or more in order to increase her ability to open tight or new jars and containers with less difficulty.    Time 4    Period Weeks    Status On-going      OT SHORT TERM GOAL #3   Title Patient will increase her left hand coordination  by completing the 9 hole peg test in 39 seconds or less.    Time 4    Period Weeks    Status On-going      OT SHORT TERM GOAL #4   Title Pt will be educated on adaptive techniques and/or equipment that may help increase her independence when completing leisure and I/ADL activities.    Time 4    Period Weeks    Status On-going                      Plan - 07/19/21 1403     Clinical Impression Statement A: Pt reports she has not tried to cook at home yet. Resumed grip strengthening today, increasing hand gripper for medium and small beads. Added card flipping task, coin manipulation, and peg separating for fine motor work, proximal shoulder strengthening to support fine motor skills and control. Verbal cuing for form and technique.    Body Structure / Function / Physical Skills ADL;FMC;Coordination;GMC;IADL;Endurance    Plan P: Reassessment-discuss with PT and SLP and see if she will have any additional visits for approval, recert if yes or if wants to self-pay; if not discharge. I made additional appts that will need to be cancelled if discharging. Follow up on HEP and cooking at home, continue with grip strengthening and fine motor work; proximal shoulder strengthening    OT Home Exercise Plan 8/19: red theraputty grip/pinch; 9/2: therapy ball shoulder strengthening    Consulted and Agree with Plan of Care Patient    Family Member Consulted brother             Patient will benefit from skilled therapeutic intervention in order to improve the following deficits and impairments:   Body Structure / Function / Physical Skills: ADL, FMC, Coordination, GMC, IADL, Endurance       Visit Diagnosis: Other symptoms and signs involving the musculoskeletal system  Other lack of coordination    Problem List Patient Active Problem List   Diagnosis Date Noted   DVT (deep venous thrombosis) (HCC) 06/10/2021   AKI (acute kidney injury) (HCC) 06/10/2021   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

## 2021-07-19 NOTE — Patient Instructions (Signed)
Therapy Ball Strengthening Exercises: Complete all exercises 10-15X each, 2x/day.   1) Overhead press: Hold a medicine ball or other ball at your chest. Next, extend your elbows and push the ball over your head as shown. Return to starting position and repeat.     2) Chest press: Hold a medicine ball or other ball at your chest. Next, extend your elbows and push the ball outward in front of your body as shown. Return to starting position and repeat.     3) Ball circles:  Hold a medicine ball or other ball at your chest. Next, extend your elbows and push the ball outward in front of your body as shown. Then, move the ball in a circular pattern for several revolutions and then reverse the direction.     4) Flexion: Start holding an exercise ball out in front of your body with elbows extended. Then, slowly lift the ball up over head. Return the ball to starting position and repeat.        

## 2021-07-23 ENCOUNTER — Ambulatory Visit (HOSPITAL_COMMUNITY): Payer: Medicaid Other | Admitting: Physical Therapy

## 2021-07-23 ENCOUNTER — Other Ambulatory Visit: Payer: Self-pay

## 2021-07-23 ENCOUNTER — Encounter (HOSPITAL_COMMUNITY): Payer: Self-pay | Admitting: Physical Therapy

## 2021-07-23 ENCOUNTER — Encounter (HOSPITAL_COMMUNITY): Payer: Self-pay | Admitting: Speech Pathology

## 2021-07-23 ENCOUNTER — Ambulatory Visit (HOSPITAL_COMMUNITY): Payer: Medicaid Other | Admitting: Speech Pathology

## 2021-07-23 DIAGNOSIS — R2689 Other abnormalities of gait and mobility: Secondary | ICD-10-CM

## 2021-07-23 DIAGNOSIS — M6281 Muscle weakness (generalized): Secondary | ICD-10-CM

## 2021-07-23 DIAGNOSIS — R41841 Cognitive communication deficit: Secondary | ICD-10-CM

## 2021-07-23 DIAGNOSIS — R29898 Other symptoms and signs involving the musculoskeletal system: Secondary | ICD-10-CM | POA: Diagnosis not present

## 2021-07-23 DIAGNOSIS — R262 Difficulty in walking, not elsewhere classified: Secondary | ICD-10-CM

## 2021-07-23 DIAGNOSIS — R2681 Unsteadiness on feet: Secondary | ICD-10-CM

## 2021-07-23 NOTE — Therapy (Signed)
[facility] [address] [place], [place], [address] Phone: [phone]   Fax:  [phone]  Physical Therapy Treatment  Patient Details  Name: Diane Todd MRN: [id number] Date of Birth: Jun 11, 2001 Referring Provider (PT): Lia Hopping   Encounter Date: 07/23/2021   PT End of Session - 07/23/21 1446     Visit Number 4    Number of Visits 12    Date for PT Re-Evaluation 08/08/21    Authorization Type [place] [employer], auth required    Authorization Time Period 12 visits approved from 8/11-10/10    Authorization - Visit Number 3    Authorization - Number of Visits 12    PT Start Time 1445    PT Stop Time 1525    PT Time Calculation (min) 40 min    Equipment Utilized During Treatment Gait belt    Activity Tolerance Patient limited by pain;Patient limited by fatigue    Behavior During Therapy [facility] for tasks assessed/performed;Anxious             Past Medical History:  Diagnosis Date   Hypertension    Hypothyroidism     Past Surgical History:  Procedure Laterality Date   IR FLUORO GUIDE CV LINE RIGHT  05/08/2021   IR US GUIDE VASC ACCESS RIGHT  05/08/2021   PERIPHERAL VASCULAR THROMBECTOMY N/A 06/14/2021   Procedure: PERIPHERAL VASCULAR THROMBECTOMY;  Surgeon: Sherren Kerns, MD;  Location: [facility];  Service: Cardiovascular;  Laterality: N/A;    There were no vitals filed for this visit.   Subjective Assessment - 07/23/21 1448     Subjective Patient states tired after last session. She has been walking more at home.    Patient is accompained by: Family member    How long can you stand comfortably? 1-2 min    How long can you walk comfortably? unable    Currently in Pain? No/denies                               [facility] Adult PT Treatment/Exercise - 07/23/21 0001       Knee/Hip Exercises: Standing   Hip Abduction Both;1 set;10 reps    Other Standing Knee Exercises weight  shifting lateral 1x 20, SL weightbearing on L 1x 10      Knee/Hip Exercises: Seated   Sit to Sand 5 reps      Knee/Hip Exercises: Supine   Hip Adduction Isometric 10 reps    Hip Adduction Isometric Limitations 10 second    Bridges Both;2 sets;10 reps    Straight Leg Raises Strengthening;Both;2 sets;10 reps    Other Supine Knee/Hip Exercises hip abd isometric 10 x 10 second holds with belt                    PT Education - 07/23/21 1447     Education Details HEP    Person(s) Educated Patient    Methods Explanation    Comprehension Verbalized understanding              PT Short Term Goals - 07/15/21 0902       PT SHORT TERM GOAL #1   Title Patient will be independent with HEP in order to improve functional outcomes.    Time 3    Period Weeks    Status On-going    Target Date 07/18/21      PT SHORT TERM GOAL #2  Title Patient will report at least 25% improvement in symptoms for improved quality of life.    Time 3    Period Weeks    Status On-going    Target Date 07/18/21      PT SHORT TERM GOAL #3   Title Demonstrate modified independent functional transfers, with or without AD    Baseline CGA with RW    Time 3    Period Weeks    Status On-going    Target Date 07/18/21      PT SHORT TERM GOAL #4   Title Patient will be able to ambulate level surfaces with supervision and RW x 50 ft to improve functional ambulation    Baseline 3 ft with CGA and RW    Time 3    Period Weeks    Status On-going    Target Date 07/18/21               PT Long Term Goals - 07/15/21 0902       PT LONG TERM GOAL #1   Title Patient will be able to ambulate x 200 ft during using least restrictive AD to manifest improved gait capabilities    Baseline 3 ft with RW and CGA    Time 6    Period Weeks    Status On-going      PT LONG TERM GOAL #2   Title Demo ability to negotiate 5 steps modified independent to enable home entry    Baseline total assist in w/c     Time 6    Period Weeks    Status On-going                   Plan - 07/23/21 1446     Clinical Impression Statement Patient ambulating with improving gait pattern compared to prior sessions. Patient able to complete increased reps of previously completed exercises. Began more standing exercises today which she tolerates well but fatigues quickly. She requires bilateral UE support for weakness and apprehension.  She was moderately fatigued at end of session. Patient will continue to benefit from skilled physical therapy in order to reduce impairment and improve function.    Personal Factors and Comorbidities Comorbidity 1    Comorbidities PMH    Examination-Activity Limitations Carry;Lift;Toileting;Stand;Stairs;Squat;Reach Overhead;Locomotion Level;Transfers    Examination-Participation Restrictions Cleaning;Community Activity;Driving;Laundry;Yard Work;Shop;Occupation;Meal Prep    Stability/Clinical Decision Making Evolving/Moderate complexity    Rehab Potential Good    PT Frequency 2x / week    PT Duration 6 weeks    PT Treatment/Interventions ADLs/Self Care Home Management;DME Instruction;Gait training;Stair training;Functional mobility training;Therapeutic activities;Therapeutic exercise;Balance training;Patient/family education;Neuromuscular re-education;Wheelchair mobility training;Manual techniques    PT Next Visit Plan Continue with WBing activities, weight shifting, gait training, bed exercise    PT Home Exercise Plan calf stretch, QS, SLR; 8/30 seat heel slide 8/31 bridge, hip abd/ add isometrics    Consulted and Agree with Plan of Care Patient             Patient will benefit from skilled therapeutic intervention in order to improve the following deficits and impairments:  Abnormal gait, Decreased activity tolerance, Decreased balance, Decreased mobility, Decreased endurance, Decreased strength, Difficulty walking, Impaired perceived functional ability, Improper body  mechanics, Pain  Visit Diagnosis: Difficulty in walking, not elsewhere classified  Muscle weakness (generalized)  Other abnormalities of gait and mobility  Unsteadiness on feet     Problem List Patient Active Problem List   Diagnosis Date Noted   DVT (  deep venous thrombosis) (HCC) 06/10/2021   AKI (acute kidney injury) (HCC) 06/10/2021   Neuromyelitis optica (devic) (HCC) 05/17/2021   Protein-calorie malnutrition, severe 05/05/2021   Optic neuritis 05/04/2021   Hypothyroidism 05/04/2021   Renal insufficiency 05/04/2021   Helicobacter pylori gastritis 04/18/2021   History of hypertension 04/18/2021   3:29 PM, 07/23/21 Wyman Songster PT, DPT Physical Therapist at [facility]    [facility] [address] [place], [place], [address] Phone: [phone]   Fax:  [phone]  Name: Diane Todd MRN: [id number] Date of Birth: November 09, 2001

## 2021-07-23 NOTE — Therapy (Signed)
[facility] [address] [place], [place], [address] Phone: [phone]   Fax:  [phone]  Speech Language Pathology Treatment  Patient Details  Name: Diane Todd MRN: [id number] Date of Birth: 11/23/2000 Referring Provider (SLP): Mariam Dollar, PA-C   Encounter Date: 07/23/2021   End of Session - 07/23/21 1747     Visit Number 3    Number of Visits 5    Date for SLP Re-Evaluation 08/01/21    Authorization Type [facility] Medicaid   07/11/21-08/09/21   SLP Start Time 1614    SLP Stop Time  1700    SLP Time Calculation (min) 46 min    Activity Tolerance Patient tolerated treatment well             Past Medical History:  Diagnosis Date   Hypertension    Hypothyroidism     Past Surgical History:  Procedure Laterality Date   IR FLUORO GUIDE CV LINE RIGHT  05/08/2021   IR US GUIDE VASC ACCESS RIGHT  05/08/2021   PERIPHERAL VASCULAR THROMBECTOMY N/A 06/14/2021   Procedure: PERIPHERAL VASCULAR THROMBECTOMY;  Surgeon: Sherren Kerns, MD;  [facility]: [facility];  Service: Cardiovascular;  Laterality: N/A;    There were no vitals filed for this visit.   Subjective Assessment - 07/23/21 1617     Subjective "I am remembering to take my medication."    Patient is accompained by: Family member    Currently in Pain? No/denies              ADULT SLP TREATMENT - 07/23/21 1739       General Information   Behavior/Cognition Alert;Cooperative;Pleasant mood    Patient Positioning Upright in chair    Oral care provided N/A    HPI Patient is a 20 y.o. year old female with history of hypertension, tobacco use, CKD stage III,  as well as thyroid disorder. Presented 05/03/2021 with generalized weakness poor appetite/nausea and vomiting as well as blurred vision x 2 weeks. 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  MRI of the brain  showed hyperintense T2 weighted signal and mild contrast-enhancement within the medial thalami and periaqueductal gray matter.  Demyelinating disease remained a primary consideration. She was admitted to CIR and d/c'd home 06/05/21, but was then re-hospitalized due to other medical complications (DVT). She was referred for OP SLP services to address cognitive communications s/p recent hospitalization.      Treatment Provided   Treatment provided Cognitive-Linquistic      Pain Assessment   Pain Assessment No/denies pain      Cognitive-Linquistic Treatment   Treatment focused on Cognition;Patient/family/caregiver education    Skilled Treatment 10 item recall task with association cues provided, self assessment, compensatory strategy training, attention tasks      Assessment / Recommendations / Plan   Plan Continue with current plan of care      Progression Toward Goals   Progression toward goals Progressing toward goals              SLP Education - 07/23/21 1743     Education Details Encouraged Pt to read a novel in preparation for returning to school in January.    Person(s) Educated Patient;Other (comment)    Methods Explanation    Comprehension Verbalized understanding              SLP Short Term Goals - 07/23/21 1802       SLP  SHORT TERM GOAL #1   Title Pt will complete functional memory tasks with 95% acc with use of memory strategies as needed.    Baseline 85%    Time 4    Period Weeks    Status On-going    Target Date 08/12/21      SLP SHORT TERM GOAL #2   Title Pt will complete moderately complex planning and organization tasks with 95% acc with min assist as needed.    Baseline mi/mod assist    Time 4    Period Weeks    Status On-going    Target Date 08/12/21                Plan - 07/23/21 1747     Clinical Impression Statement Pt did not remember to bring in her folder given during previous session, but did indicate that she is now responsible  for remembering to take her medication (assigned previous session). She mostly watches television during the day and was encouraged to read a novel, as she has a goal to return to community college in January. In session, she completed a 10-item recall task with association cue with 5/10, 6/10, 9/10, and 10/10 on the fourth trial. She was then able to recall 8 of 10 words without association cues. She completed an oral spelling attention task with 100% acc and a recall after a distractor activity with 96% acc with min cues. Continue to address goals.   Speech Therapy Frequency 1x /week    Duration 4 weeks    Treatment/Interventions Cueing hierarchy;SLP instruction and feedback;Compensatory techniques;Cognitive reorganization;Patient/family education;Internal/external aids    Potential to Achieve Goals Good    SLP Home Exercise Plan Pt will completed HEP as assigned to facilitate carryover of treatment strategies and techniques in home environment with use of written cues as needed.    Consulted and Agree with Plan of Care Patient             Patient will benefit from skilled therapeutic intervention in order to improve the following deficits and impairments:   Cognitive communication deficit    Problem List Patient Active Problem List   Diagnosis Date Noted   DVT (deep venous thrombosis) (HCC) 06/10/2021   AKI (acute kidney injury) (HCC) 06/10/2021   Neuromyelitis optica (devic) (HCC) 05/17/2021   Protein-calorie malnutrition, severe 05/05/2021   Optic neuritis 05/04/2021   Hypothyroidism 05/04/2021   Renal insufficiency 05/04/2021   Helicobacter pylori gastritis 04/18/2021   History of hypertension 04/18/2021   Thank you,  Havery Moros, CCC-SLP [phone]  Brandun Pinn 07/23/2021, 6:05 PM  [facility] [address] [place], [place], [address] Phone: [phone]   Fax:  [phone]   Name: Diane Todd MRN:  [id number] Date of Birth: 07/22/2001

## 2021-07-24 ENCOUNTER — Ambulatory Visit (HOSPITAL_COMMUNITY): Payer: Medicaid Other

## 2021-07-25 ENCOUNTER — Ambulatory Visit (HOSPITAL_COMMUNITY): Payer: Medicaid Other

## 2021-07-25 ENCOUNTER — Encounter (HOSPITAL_COMMUNITY): Payer: Medicaid Other

## 2021-07-25 ENCOUNTER — Telehealth (HOSPITAL_COMMUNITY): Payer: Self-pay

## 2021-07-25 NOTE — Telephone Encounter (Signed)
Called patient regarding no show for PT and OT appointment today. Reminder provided for next appointment on 07/26/21 and to call if she is unable to make it.   Limmie Patricia, OTR/L,CBIS  [phone]

## 2021-07-26 ENCOUNTER — Ambulatory Visit (HOSPITAL_COMMUNITY): Payer: Medicaid Other | Admitting: Occupational Therapy

## 2021-07-26 ENCOUNTER — Other Ambulatory Visit: Payer: Self-pay

## 2021-07-26 ENCOUNTER — Encounter (HOSPITAL_COMMUNITY): Payer: Self-pay | Admitting: Occupational Therapy

## 2021-07-26 DIAGNOSIS — M6281 Muscle weakness (generalized): Secondary | ICD-10-CM

## 2021-07-26 DIAGNOSIS — R29898 Other symptoms and signs involving the musculoskeletal system: Secondary | ICD-10-CM | POA: Diagnosis not present

## 2021-07-26 DIAGNOSIS — R278 Other lack of coordination: Secondary | ICD-10-CM

## 2021-07-26 NOTE — Therapy (Signed)
[place], [place], [address] Phone: [phone]   Fax:  [phone]  Occupational Therapy Treatment, reassess, and discharge   Patient Details  Name: Diane Todd MRN: [id number] Date of Birth: 03-12-2001 Referring Provider (OT): Jethro Bolus, PA-C   Encounter Date: 07/26/2021   OT End of Session - 07/26/21 1607     Visit Number 7    Authorization Type [employer] Medicaid    Authorization Time Period 27 visits combined a year for OT/PT/SLP. 8 visits approved 8/16-10/15/22    Authorization - Visit Number 6    Authorization - Number of Visits 8    OT Start Time 1520    OT Stop Time 1558    OT Time Calculation (min) 38 min    Activity Tolerance Patient tolerated treatment well    Behavior During Therapy WFL for tasks assessed/performed             Past Medical History:  Diagnosis Date   Hypertension    Hypothyroidism     Past Surgical History:  Procedure Laterality Date   IR FLUORO GUIDE CV LINE RIGHT  05/08/2021   IR US GUIDE VASC ACCESS RIGHT  05/08/2021   PERIPHERAL VASCULAR THROMBECTOMY N/A 06/14/2021   Procedure: PERIPHERAL VASCULAR THROMBECTOMY;  Surgeon: Elam Dutch, MD;  Location: [facility];  Service: Cardiovascular;  Laterality: N/A;    There were no vitals filed for this visit.   Subjective Assessment - 07/26/21 1520     Subjective  S: no pain    Currently in Pain? No/denies                [facility] OT Assessment - 07/26/21 0001       Assessment   Medical Diagnosis UB weakness/decreased coordination      Precautions   Precautions Fall      Observation/Other Assessments   Other Surveys  Select    Upper Extremity Functional Index  64/80      Coordination   9 Hole Peg Test Left;Right    Right 9 Hole Peg Test 36.0"    Left 9 Hole Peg Test 39"      Strength   Overall Strength Comments Assessed seated. IR/er abducted    Strength Assessment Site  Shoulder;Elbow    Right/Left Shoulder Right;Left    Right Shoulder Flexion 4+/5    Right Shoulder ABduction 5/5    Right Shoulder Internal Rotation 5/5    Right Shoulder External Rotation 5/5    Left Shoulder Flexion 4+/5    Left Shoulder ABduction 5/5    Left Shoulder Internal Rotation 4+/5    Left Shoulder External Rotation 5/5    Right/Left Elbow Right;Left    Right Elbow Flexion 5/5    Right Elbow Extension 5/5    Left Elbow Flexion 5/5    Left Elbow Extension 5/5    Right Hand Grip (lbs) 45    Right Hand Lateral Pinch 13 lbs    Right Hand 3 Point Pinch 13 lbs    Left Hand Grip (lbs) 40    Left Hand Lateral Pinch 13 lbs    Left Hand 3 Point Pinch 13 lbs                      OT Treatments/Exercises (OP) - 07/26/21 0001       Neurological Re-education Exercises   Hand Gripper with Large Beads all beads gripper at 37# for left hand; 37#  for right hand, vertical    Hand Gripper with Medium Beads all beads gripper at 37# for left hand initially grading to 35#; 37# for right hand, vertical    Hand Gripper with Small Beads all beads gripper at 35? 29#, both hands.- vertical. standing                   UEFI - 07/26/21 0001     UEFI Total Score 64               OT Short Term Goals - 07/26/21 1542       OT SHORT TERM GOAL #1   Title Patient will be educated and independent with HEP in order to faciliate her progress in therapy and increase her independence with simple meal prep tasks.    Baseline 9/9: Pt was provided education on adaptive strategies and grip/pinch strengthening with red putty.    Time 4    Period Weeks    Status Achieved    Target Date 07/25/21      OT SHORT TERM GOAL #2   Title Patient will increase her bilateral hand strength by 15# or more in order to increase her ability to open tight or new jars and containers with less difficulty.    Baseline 9/9: Pt increased R grip by 10# and L grip by 5#. Pt not yet meeting goal but  progressing towards it.    Time 4    Period Weeks    Status Not Met      OT SHORT TERM GOAL #3   Title Patient will increase her left hand coordination by completing the 9 hole peg test in 39 seconds or less.    Baseline 9/9: Pt met this goal with right and L hand.    Time 4    Period Weeks    Status Achieved      OT SHORT TERM GOAL #4   Title Pt will be educated on adaptive techniques and/or equipment that may help increase her independence when completing leisure and I/ADL activities.    Baseline 9/9: pt was educated on adaptive strategies and reports improved function.    Time 4    Period Weeks    Status Achieved                      Plan - 07/26/21 1608     Clinical Impression Statement A: Pt was reassessed this date and is meeting 3/4 goals with pt reporting she feels good about d/c and continued work on grip strengthening at home. Pt reports improvement in functiona use of B UE which can be seen by her increase in UEFI score to 64/80. Pt was able to complete gripper strengthening exercises with increase in # most noteably for small beads which increased to 35# from 29# in previous attempt.    Body Structure / Function / Physical Skills ADL;FMC;Coordination;GMC;IADL;Endurance    Plan P: Pt was discharged this date due to meeting multiple goals and saving visits for PT.    OT Home Exercise Plan 8/19: red theraputty grip/pinch; 9/2: therapy ball shoulder strengthening    Consulted and Agree with Plan of Care Patient             Patient will benefit from skilled therapeutic intervention in order to improve the following deficits and impairments:   Body Structure / Function / Physical Skills: ADL, [place], Coordination, GMC, IADL, Endurance    OCCUPATIONAL THERAPY DISCHARGE SUMMARY  Visits  from Start of Care: 7 including evaluation   Current functional level related to goals / functional outcomes: Pt is meeting 3/4 goals and has improved and is progressing in grip  strength.    Remaining deficits: Grip strength.    Education / Equipment: 8/19: red theraputty grip/pinch; 9/2: therapy ball shoulder strengthening    Plan: Patient agrees to discharge.  Patient goals were partially met. Patient is being discharged due to meeting 3/4 rehab goals and agreeing to work on remaining goal with HEP.         Visit Diagnosis: Muscle weakness (generalized)  Other lack of coordination    Problem List Patient Active Problem List   Diagnosis Date Noted   DVT (deep venous thrombosis) ([place]) 06/10/2021   AKI (acute kidney injury) ([place]) 06/10/2021   Neuromyelitis optica (devic) ([place]) 05/17/2021   Protein-calorie malnutrition, severe 05/05/2021   Optic neuritis 05/04/2021   Hypothyroidism 05/04/2021   Renal insufficiency 34/04/8402   Helicobacter pylori gastritis 04/18/2021   History of hypertension 04/18/2021   Larey Seat OT, MOT  Larey Seat, OT/L 07/26/2021, 4:15 PM  [facility] [address] [place], [place], [address] Phone: [phone]   Fax:  [phone]  Name: Diane Todd MRN: [id number] Date of Birth: Jan 05, 2001

## 2021-07-29 ENCOUNTER — Ambulatory Visit (HOSPITAL_COMMUNITY): Payer: Medicaid Other | Admitting: Speech Pathology

## 2021-07-30 ENCOUNTER — Encounter (HOSPITAL_COMMUNITY): Payer: Medicaid Other | Admitting: Physical Therapy

## 2021-07-30 ENCOUNTER — Ambulatory Visit (HOSPITAL_COMMUNITY): Payer: Medicaid Other | Admitting: Speech Pathology

## 2021-07-31 ENCOUNTER — Encounter (HOSPITAL_COMMUNITY): Payer: Self-pay

## 2021-08-01 ENCOUNTER — Encounter (HOSPITAL_COMMUNITY): Payer: Self-pay

## 2021-08-01 ENCOUNTER — Ambulatory Visit (HOSPITAL_COMMUNITY): Payer: Medicaid Other

## 2021-08-01 ENCOUNTER — Other Ambulatory Visit: Payer: Self-pay

## 2021-08-01 DIAGNOSIS — R29898 Other symptoms and signs involving the musculoskeletal system: Secondary | ICD-10-CM | POA: Diagnosis not present

## 2021-08-01 DIAGNOSIS — R2681 Unsteadiness on feet: Secondary | ICD-10-CM

## 2021-08-01 DIAGNOSIS — R2689 Other abnormalities of gait and mobility: Secondary | ICD-10-CM

## 2021-08-01 DIAGNOSIS — M6281 Muscle weakness (generalized): Secondary | ICD-10-CM

## 2021-08-01 DIAGNOSIS — R262 Difficulty in walking, not elsewhere classified: Secondary | ICD-10-CM

## 2021-08-01 NOTE — Patient Instructions (Signed)
HIP: Abduction / External Rotation (Band)    Place red band around knees. WE DID THIS ONE AT THE CLINIC IN YOU SITTING IN YOUR WHEELCHAIR WITH THE RED BAND. Hold 2-3_ seconds. Use RED_ band. 10-15 reps per set, _1 sets per day.  Copyright  VHI. All rights reserved.

## 2021-08-01 NOTE — Therapy (Signed)
[facility] [address] [place], [place], [address] Phone: [phone]   Fax:  [phone]  Physical Therapy Treatment  Patient Details  Name: Diane Todd MRN: [id number] Date of Birth: October 02, 2001 Referring Provider (PT): Lia Hopping   Encounter Date: 08/01/2021   PT End of Session - 08/01/21 1257     Visit Number 5    Number of Visits 12    Date for PT Re-Evaluation 08/08/21    Authorization Type [place] Medicaid [employer], auth required    Authorization Time Period 12 visits approved from 8/11-10/10    Authorization - Visit Number 5    Authorization - Number of Visits 12    PT Start Time 1151    PT Stop Time 1232    PT Time Calculation (min) 41 min    Equipment Utilized During Treatment Gait belt    Activity Tolerance Patient limited by pain;Patient limited by fatigue    Behavior During Therapy [facility] for tasks assessed/performed;Anxious             Past Medical History:  Diagnosis Date   Hypertension    Hypothyroidism     Past Surgical History:  Procedure Laterality Date   IR FLUORO GUIDE CV LINE RIGHT  05/08/2021   IR US GUIDE VASC ACCESS RIGHT  05/08/2021   PERIPHERAL VASCULAR THROMBECTOMY N/A 06/14/2021   Procedure: PERIPHERAL VASCULAR THROMBECTOMY;  Surgeon: Sherren Kerns, MD;  [facility]: [facility];  Service: Cardiovascular;  Laterality: N/A;    There were no vitals filed for this visit.   Subjective Assessment - 08/01/21 1255     Subjective Patient reports she can walk from the front part of her house to the back now. Feels she is getting better over time.    Patient is accompained by: Family member    How long can you stand comfortably? 1-2 min    How long can you walk comfortably? unable    Currently in Pain? No/denies              [facility] Adult PT Treatment/Exercise - 08/01/21 0001       Ambulation/Gait   Ambulation/Gait Yes    Ambulation/Gait Assistance 4: Min guard    Ambulation  Distance (Feet) 126 Feet    Assistive device Rolling walker    Gait Pattern Antalgic;Decreased stance time - left;Step-through pattern    Gait Comments LLE calf pain limits weight acceptance but improved      Knee/Hip Exercises: Standing   Hip Abduction Both;10 reps;2 sets    Hip Extension Stengthening;Both;2 sets;10 reps      Knee/Hip Exercises: Seated   Long Arc Quad Both;2 sets   3 sec hold   Clamshell with TheraBand Red   15 reps   Marching Strengthening;Both;10 reps;2 sets    Sit to Sand 2 sets;without UE support   3 reps                    PT Education - 08/01/21 1256     Education Details Discussed purpose and technique of skilled interventions throughout session. Advanced HEP. Issued red theraband. Progress thus far.    Person(s) Educated Patient    Methods Explanation;Handout    Comprehension Verbalized understanding              PT Short Term Goals - 08/01/21 1258       PT SHORT TERM GOAL #1   Title Patient will be independent with HEP in order to  improve functional outcomes.    Time 3    Period Weeks    Status On-going    Target Date 07/18/21      PT SHORT TERM GOAL #2   Title Patient will report at least 25% improvement in symptoms for improved quality of life.    Time 3    Period Weeks    Status On-going    Target Date 07/18/21      PT SHORT TERM GOAL #3   Title Demonstrate modified independent functional transfers, with or without AD    Baseline CGA with RW    Time 3    Period Weeks    Status On-going    Target Date 07/18/21      PT SHORT TERM GOAL #4   Title Patient will be able to ambulate level surfaces with supervision and RW x 50 ft to improve functional ambulation    Baseline 08/01/21- 126 feet with RW    Time 3    Period Weeks    Status Achieved    Target Date 07/18/21               PT Long Term Goals - 08/01/21 1259       PT LONG TERM GOAL #1   Title Patient will be able to ambulate x 200 ft during  using least restrictive AD to manifest improved gait capabilities    Baseline 3 ft with RW and CGA    Time 6    Period Weeks    Status On-going      PT LONG TERM GOAL #2   Title Demo ability to negotiate 5 steps modified independent to enable home entry    Baseline total assist in w/c    Time 6    Period Weeks    Status On-going                   Plan - 08/01/21 1257     Clinical Impression Statement Session focused on gait training, lower extremity strengthening and balancing exercises. Patient able to ambulate 126 feet in 2 minutes with RW with improved gait and decreased left calf pain. Increased reps, sets and resistance to increase difficulty level of therapeutic exercises. Patient with moderate complaints of fatigue at end of session. Added seated clams with RTB issued to HEP. Patient required rest breaks throughout session due to fatigue. Patient will continue to benefit from skilled therapy services to reduce deficits and improve functional abilities.    Personal Factors and Comorbidities Comorbidity 1    Comorbidities PMH    Examination-Activity Limitations Carry;Lift;Toileting;Stand;Stairs;Squat;Reach Overhead;Locomotion Level;Transfers    Examination-Participation Restrictions Cleaning;Community Activity;Driving;Laundry;Yard Work;Shop;Occupation;Meal Prep    Stability/Clinical Decision Making Evolving/Moderate complexity    Rehab Potential Good    PT Frequency 2x / week    PT Duration 6 weeks    PT Treatment/Interventions ADLs/Self Care Home Management;DME Instruction;Gait training;Stair training;Functional mobility training;Therapeutic activities;Therapeutic exercise;Balance training;Patient/family education;Neuromuscular re-education;Wheelchair mobility training;Manual techniques    PT Next Visit Plan Continue with WBing activities, weight shifting, gait training, bed exercise    PT Home Exercise Plan calf stretch, QS, SLR; 8/30 seat heel slide 8/31 bridge, hip abd/  add isometrics; 9/15 seat clams w/ RTB    Consulted and Agree with Plan of Care Patient             Patient will benefit from skilled therapeutic intervention in order to improve the following deficits and impairments:  Abnormal gait, Decreased activity tolerance, Decreased  balance, Decreased mobility, Decreased endurance, Decreased strength, Difficulty walking, Impaired perceived functional ability, Improper body mechanics, Pain  Visit Diagnosis: Difficulty in walking, not elsewhere classified  Other abnormalities of gait and mobility  Unsteadiness on feet  Muscle weakness (generalized)     Problem List Patient Active Problem List   Diagnosis Date Noted   DVT (deep venous thrombosis) (HCC) 06/10/2021   AKI (acute kidney injury) (HCC) 06/10/2021   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 Hartnett-Rands, MS, PT [place] [facility] [place] [phone]  Epifanio Lesches, PT 08/01/2021, 2:10 PM  [facility] [address] [place], [place], [address] Phone: [phone]   Fax:  [phone]  Name: Diane Todd MRN: [id number] Date of Birth: [birth year]

## 2021-08-02 ENCOUNTER — Encounter (HOSPITAL_COMMUNITY): Payer: Self-pay | Admitting: Occupational Therapy

## 2021-08-06 ENCOUNTER — Ambulatory Visit (HOSPITAL_COMMUNITY): Payer: Medicaid Other | Admitting: Speech Pathology

## 2021-08-06 ENCOUNTER — Encounter (HOSPITAL_COMMUNITY): Payer: Medicaid Other

## 2021-08-06 ENCOUNTER — Encounter (HOSPITAL_COMMUNITY): Payer: Self-pay

## 2021-08-08 ENCOUNTER — Telehealth (HOSPITAL_COMMUNITY): Payer: Self-pay | Admitting: Physical Therapy

## 2021-08-08 ENCOUNTER — Encounter (HOSPITAL_COMMUNITY): Payer: Medicaid Other | Admitting: Physical Therapy

## 2021-08-08 ENCOUNTER — Encounter (HOSPITAL_COMMUNITY): Payer: Self-pay | Admitting: Occupational Therapy

## 2021-08-08 NOTE — Telephone Encounter (Signed)
Patient no show, left message for patient making her aware of missed appointment and educated her that she had no appointments remaining and instructed her to call back if wishing to return to physical therapy.   1:39 PM, 08/08/21 Wyman Songster PT, DPT Physical Therapist at [facility]

## 2021-08-13 ENCOUNTER — Encounter (HOSPITAL_COMMUNITY): Payer: Self-pay

## 2021-08-15 ENCOUNTER — Encounter (HOSPITAL_COMMUNITY): Payer: Self-pay

## 2021-08-20 ENCOUNTER — Encounter (HOSPITAL_COMMUNITY): Payer: Self-pay

## 2021-09-02 ENCOUNTER — Ambulatory Visit: Payer: Medicaid Other | Admitting: Neurology

## 2021-09-02 ENCOUNTER — Encounter: Payer: Self-pay | Admitting: Neurology

## 2021-09-02 VITALS — BP 151/96 | HR 141 | Ht 67.0 in | Wt 215.0 lb

## 2021-09-02 DIAGNOSIS — E519 Thiamine deficiency, unspecified: Secondary | ICD-10-CM | POA: Insufficient documentation

## 2021-09-02 DIAGNOSIS — E512 Wernicke's encephalopathy: Secondary | ICD-10-CM | POA: Diagnosis not present

## 2021-09-02 DIAGNOSIS — R269 Unspecified abnormalities of gait and mobility: Secondary | ICD-10-CM | POA: Insufficient documentation

## 2021-09-02 HISTORY — DX: Wernicke's encephalopathy: E51.2

## 2021-09-02 NOTE — Progress Notes (Signed)
[facility]  PATIENT: Diane Todd DOB: 2001-10-14  REFERRING DOCTOR OR PCP: Dr. Olena Leatherwood SOURCE: Patient, notes from primary care, notes from hospital, laboratory and imaging reports, imaging personally reviewed.  _________________________________   HISTORICAL  CHIEF COMPLAINT:  Chief Complaint  Patient presents with   Follow-up    Rm 1, w sister. 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 Pt reports mobility has improved, using walker. Pt reports memory has improved as well.     HISTORY OF PRESENT ILLNESS:   Diane Todd is a 20 y.o. woman with sequelae of thiamine deficiency  Update 09/02/2021: She is feeling better.    She is feeling her cognition is back to baseline.   Vision is baseline.   Gait has improved and she usually uses the walker now (can go 200 feet) and only uses the wheelchair for long distance.   Around the house she does not need the walker.  She continues to do PT.     She is taking thiamine supplements.  Thiamine deficiency likely occurred due to 2 months of nausea and vomiting and very poor p.o. intake she lost 50 pounds for those 2 months.  She had presented in June 2022 with severe neurologic deficits and an abnormal brain MRI.  In the hospital, she was felt to possibly have neuromyelitis optica.  However, thiamine was deficient, MRI of the spinal cord was negative and anti-NMO antibodies were negative making the diagnosis of neuromyelitis optica less likely than thiamine deficiency.   History of neurologic symptoms She began to experience various neurologic symptoms in June 2022, couple months after she had severe nutritional deficits.   She had nausea but not vomiting starting around April but then had vomiting as well in May into June.   She lost about 50 pounds during that time.  She feels her nutritional intake was near zero for a couple months .  She got some liquids in but no solid food.  She went to the [facility] ED   and [facility] ED a few times for hydration.    She had H. Pylori on EGD but was unable to swallow the pills.     In June 2022, she had the onset of blurry vision followed by weakness and reduced ability to walk   Her memory was impaired.   She does not believe she experienced any neurologic deficits in April or May.  At the time, she was eating poorly.   No alcohol use.   She had an MRI 05/02/2021 showing increased T2 hyperintensity in periaqueductal grey matter and midline thalamus.   She was admitted and had additional scans and evaluation.     She reports that the nausea started April 2022 but difficulties with strength/gait did not start until early June.    She was admitted May 02, 2021 and had testing.  Thiamine was low and was infused.   She has no memory of events from about 04/26/21 through late July 2022.    She feels much better cognitively and now just feels minimally forgetful.    She is still not able to walk due to weakness.  Additionally, she has a left DVT that is painful and is limiting her walking.  She is doing therapy.  Arms are just slightly weak.   She has mild tremor at times.   She feels vision is better and she is seeing fairly clearly now.    Bladder is doing well.      She was  discharged in early July but developed a left DVT and was readmitted and started on Eliquis.   She denies SOB.  When thiamine was rechecked during that admission, it was normal.  A repeat MRI during that admission showed improved appearance of the T2 hyperintense changes and no further enhancement she denies palpitations but has a rapid heart rate.    She began to have less nausea in late June and has beemneating well since late June 2022.   She received IV thiamine, steroids and plasmapheresis while hositalized in June 2022.        Imaging and other data MRI of the head 05/04/2021 showed T2 hyperintensity in the periaqueductal/periventricular gray matter of the medulla, pons and midbrain.  Similarly in the  medial thalamus.  The T2 hyperintense areas also have mild enhancement after contrast.  MRI of the cervical and thoracic spine 05/04/2021 was normal.  MRI of the orbits 05/05/2021 we demonstrated the T2 hyperintensity with subtle enhancement of the periaqueductal gray matter and medial thalamus.  The orbits were normal.  MRI of the head 06/11/2021 showed marked improvement of the T2/FLAIR hyperintensity in the periaqueductal gray matter and medial thalamus though some residual changes were noted.  There was no abnormal enhancement.  Laboratory studies from June 2022: Neuromyelitis optica IgG 05/14/2021 was negative.  CSF 05/13/2021 showed an increased IgG index but no oligoclonal bands there were 8 white blood cells, normal protein, elevated glucose.  Thiamine 05/13/2021 was low at 31 and 38 (66-200 normal).  When we checked 06/15/2021 it was normal  HIV negative.  Creatine kinase 06/11/2021 was elevated at 1500.  ANA was negative.  Lupus anticoagulant and DRV VT was elevated.  Indeterminate anticardiolipin IgM was noted.  DRV VT confirm was slightly elevated.   REVIEW OF SYSTEMS: Constitutional: No fevers, chills, sweats, or change in appetite Eyes: No visual changes, double vision, eye pain Ear, nose and throat: No hearing loss, ear pain, nasal congestion, sore throat Cardiovascular: No chest pain, palpitations Respiratory:  No shortness of breath at rest or with exertion.   No wheezes GastrointestinaI: No nausea, vomiting, diarrhea, abdominal pain, fecal incontinence Genitourinary:  No dysuria, urinary retention or frequency.  No nocturia. Musculoskeletal:  No neck pain, back pain Integumentary: No rash, pruritus, skin lesions Neurological: as above Psychiatric: No depression at this time.  No anxiety Endocrine: No palpitations, diaphoresis, change in appetite, change in weigh or increased thirst Hematologic/Lymphatic:  No anemia, purpura, petechiae. Allergic/Immunologic: No itchy/runny  eyes, nasal congestion, recent allergic reactions, rashes  ALLERGIES: No Known Allergies  HOME MEDICATIONS:  Current Outpatient Medications:    apixaban (ELIQUIS) 5 MG TABS tablet, Take 1 tablet (5 mg total) by mouth 2 (two) times daily., Disp: 60 tablet, Rfl: 0   apixaban (ELIQUIS) 5 MG TABS tablet, Take 2 tablets (10 mg total) by mouth 2 (two) times daily., Disp: 26 tablet, Rfl: 0   levothyroxine (SYNTHROID) 100 MCG tablet, Take 1 tablet (100 mcg total) by mouth daily., Disp: 30 tablet, Rfl: 0   propranolol (INDERAL) 10 MG tablet, Take 1 tablet (10 mg total) by mouth daily., Disp: 30 tablet, Rfl: 0   thiamine 100 MG tablet, Take 1 tablet (100 mg total) by mouth daily., Disp: 30 tablet, Rfl: 3  PAST MEDICAL HISTORY: Past Medical History:  Diagnosis Date   Hypertension    Hypothyroidism     PAST SURGICAL HISTORY: Past Surgical History:  Procedure Laterality Date   IR FLUORO GUIDE CV LINE RIGHT  05/08/2021  IR US GUIDE VASC ACCESS RIGHT  05/08/2021   PERIPHERAL VASCULAR THROMBECTOMY N/A 06/14/2021   Procedure: PERIPHERAL VASCULAR THROMBECTOMY;  Surgeon: Sherren Kerns, MD;  Location: [facility] INVASIVE CV LAB;  Service: Cardiovascular;  Laterality: N/A;    FAMILY HISTORY: Family History  Problem Relation Age of Onset   Hypertension Mother    Diabetes Mellitus II Other     SOCIAL HISTORY:  Social History   Socioeconomic History   Marital status: Single    Spouse name: Not on file   Number of children: 0   Years of education: 13   Highest education level: Not on file  Occupational History   Not on file  Tobacco Use   Smoking status: Former    Types: E-cigarettes   Smokeless tobacco: Never  Vaping Use   Vaping Use: Some days   Substances: Nicotine, Flavoring  Substance and Sexual Activity   Alcohol use: Not Currently   Drug use: Not Currently   Sexual activity: Not Currently  Other Topics Concern   Not on file  Social History Narrative   Right handed   Caffeine  use: soda (1 per day)   Lives with parents   Social Determinants of Health   Financial Resource Strain: Not on file  Food Insecurity: Not on file  Transportation Needs: Not on file  Physical Activity: Not on file  Stress: Not on file  Social Connections: Not on file  Intimate Partner Violence: Not on file     PHYSICAL EXAM  Vitals:   09/02/21 1416  BP: (!) 151/96  Pulse: (!) 141  Weight: 215 lb (97.5 kg)  Height: 5\' 7"  (1.702 m)    Pulse recheck 114 and regular  Body mass index is 33.67 kg/m.  Visual acuity is 20/20 OS and 20/25 OD  General: The patient is well-developed and well-nourished and in no acute distress  HEENT:  Head is [place]/AT.  Sclera are anicteric.     Skin: Extremities are without rash or  edema.  Musculoskeletal/extremities:  Back is nontender.  Skin normal appearance  Neurologic Exam  Mental status: The patient is alert and oriented x 3 at the time of the examination. The patient has apparent normal recent and remote memory, with an apparently normal attention span and concentration ability.   Speech is normal.  Cranial nerves: Extraocular movements are full. Pupils are equal, round, and reactive to light and accomodation.  Visual fields are full.  Facial strength and sensation was normal.  No obvious hearing deficits are noted.  Motor:  Muscle bulk is normal.   Tone is normal. Strength is  5 / 5 in the arms and  proximal legs and 4+/5 in the EHL foot muscles.    Sensory: Sensory testing is intact to pinprick, soft touch and vibration sensation in all 4 extremities.  Coordination: Cerebellar testing reveals good finger-nose-finger and heel-to-shin bilaterally.  Gait and station: She is able to stand without support.  She has a good stride with her walker.  There is no foot drop.  She cannot do tandem without holding on.  Romberg was negative today  Reflexes: Deep tendon reflexes are symmetric and normal bilaterally.       DIAGNOSTIC DATA (LABS,  IMAGING, TESTING) - I reviewed patient records, labs, notes, testing and imaging myself where available.  Lab Results  Component Value Date   WBC 6.7 06/16/2021   HGB 10.2 (L) 06/16/2021   HCT 33.6 (L) 06/16/2021   MCV 93.6 06/16/2021   PLT  270 06/16/2021      Component Value Date/Time   NA 140 06/16/2021 0217   K 4.1 06/16/2021 0217   CL 112 (H) 06/16/2021 0217   CO2 20 (L) 06/16/2021 0217   GLUCOSE 89 06/16/2021 0217   BUN 5 (L) 06/16/2021 0217   CREATININE 1.12 (H) 06/16/2021 0217   CALCIUM 8.9 06/16/2021 0217   PROT 6.6 06/11/2021 0158   ALBUMIN 2.9 (L) 06/11/2021 0158   ALBUMIN 3.8 (L) 05/09/2021 0830   AST 47 (H) 06/11/2021 0158   ALT 25 06/11/2021 0158   ALKPHOS 64 06/11/2021 0158   BILITOT 0.8 06/11/2021 0158   GFRNONAA >60 06/16/2021 0217   No results found for: CHOL, HDL, LDLCALC, LDLDIRECT, TRIG, CHOLHDL Lab Results  Component Value Date   HGBA1C 5.8 (H) 05/04/2021   No results found for: VITAMINB12 Lab Results  Component Value Date   TSH 1.197 06/11/2021       ASSESSMENT AND PLAN  Wernicke's encephalopathy  Thiamine deficiency  Gait disturbance   She will continue thiamine supplementation.  Around the time of the next visit we will check another MRI of the brain to determine if there has been any progression and assess the degree of improvement We discussed that based on characteristics of her history, imaging and laboratory tests I feel that it is much more likely that her symptoms are due to thiamine deficiency then to neuromyelitis optica. She will continue rehab. Stay active and exercise as tolerated. She will return to see me in 4 to 6 months or sooner if there are new or worsening neurologic symptoms.  Ozzie Knobel A. Epimenio Foot, MD, [facility] 09/02/2021, 4:26 PM Certified in Neurology, Clinical Neurophysiology, Sleep Medicine and Neuroimaging  [facility] [address] [place], [place] [address] [phone]

## 2021-10-17 ENCOUNTER — Encounter (HOSPITAL_COMMUNITY): Payer: Self-pay | Admitting: Speech Pathology

## 2021-10-17 NOTE — Therapy (Signed)
[facility] [address] [place], [place], [address] Phone: [phone]   Fax:  [phone]  Patient Details  Name: Diane Todd MRN: [id number] Date of Birth: 06/13/2001 Referring Provider:  No ref. provider found  Encounter Date: 10/17/2021  SPEECH THERAPY DISCHARGE SUMMARY  Visits from Start of Care: 3  Current functional level related to goals / functional outcomes: Met   Remaining deficits: N/A   Education / Equipment: Complete   Patient agrees to discharge. Patient goals were met. Patient is being discharged due to being pleased with the current functional level..   Thank you,  Dabney Porter, CCC-SLP [phone]  PORTER,DABNEY 10/17/2021, 3:17 PM  [facility] [address] [place], [place], [address] Phone: [phone]   Fax:  [phone] 

## 2021-12-18 ENCOUNTER — Other Ambulatory Visit: Payer: Self-pay

## 2021-12-18 ENCOUNTER — Encounter: Payer: Self-pay | Admitting: Vascular Surgery

## 2021-12-18 ENCOUNTER — Ambulatory Visit (INDEPENDENT_AMBULATORY_CARE_PROVIDER_SITE_OTHER): Payer: Medicaid Other | Admitting: Vascular Surgery

## 2021-12-18 VITALS — BP 137/94 | HR 103 | Temp 99.0°F | Resp 16 | Ht 68.0 in | Wt 246.0 lb

## 2021-12-18 DIAGNOSIS — I82402 Acute embolism and thrombosis of unspecified deep veins of left lower extremity: Secondary | ICD-10-CM | POA: Diagnosis not present

## 2021-12-18 NOTE — Progress Notes (Signed)
REASON FOR VISIT:   Follow-up of left lower extremity DVT.  MEDICAL ISSUES:   HISTORY OF LEFT LOWER EXTREMITY DVT: This patient had an extensive left lower extremity DVT after a prolonged hospitalization.  On 06/14/2020 underwent mechanical thrombectomy and angioplasty of the left common iliac vein.  Follow-up duplex in July showed resolution of the clot except for some residual clot in her posterior tibial and peroneal veins.  She has been on Eliquis for 6 months.  At this point I think it would be reasonable to stop her Eliquis.  I have explained that she will be at slightly higher risk for DVT in the future.  We have discussed importance of exercise specifically walking and also water aerobics.  I have encouraged her to avoid prolonged sitting and standing.  We also discussed the importance of leg elevation and the proper positioning for this.  I plan on seeing her back in 6 months with a follow-up duplex.  She knows to call sooner if she has problems.  She will stop her Eliquis after she completes the medication that she has.   HPI:   Diane Todd is a pleasant 21 y.o. female who presented with a left lower extremity DVT.  She has a history of neuromyelitis optica and had a prolonged hospitalization just prior to developing the DVT.  She went home and developed leg swelling.  On 06/14/2021 she underwent mechanical thrombectomy of her IVC, left common iliac vein, external iliac vein, common femoral vein, and femoral vein.  She also had angioplasty of the common iliac vein.  Dr. Oneida Alar did not place a stent but felt this could be an option if she developed recurrent clot in the future.  She had a follow-up duplex scan on 07/18/2021 which showed resolution of the clot in her iliac system and femoral system.  She had some chronic clot in the posterior tibial and peroneal veins.  She has now been on Eliquis for 6 months.  She is had no significant problems with leg swelling recently.  She has  been much more active.  She has no chest pain or shortness of breath.  Past Medical History:  Diagnosis Date   Hypertension    Hypothyroidism     Family History  Problem Relation Age of Onset   Hypertension Mother    Diabetes Mellitus II Other     SOCIAL HISTORY: Social History   Tobacco Use   Smoking status: Former    Types: E-cigarettes   Smokeless tobacco: Never  Substance Use Topics   Alcohol use: Not Currently    No Known Allergies  Current Outpatient Medications  Medication Sig Dispense Refill   apixaban (ELIQUIS) 5 MG TABS tablet Take 1 tablet (5 mg total) by mouth 2 (two) times daily. 60 tablet 0   apixaban (ELIQUIS) 5 MG TABS tablet Take 2 tablets (10 mg total) by mouth 2 (two) times daily. 26 tablet 0   levothyroxine (SYNTHROID) 100 MCG tablet Take 1 tablet (100 mcg total) by mouth daily. 30 tablet 0   propranolol (INDERAL) 10 MG tablet Take 1 tablet (10 mg total) by mouth daily. 30 tablet 0   thiamine 100 MG tablet Take 1 tablet (100 mg total) by mouth daily. 30 tablet 3   No current facility-administered medications for this visit.    REVIEW OF SYSTEMS:  [X]  denotes positive finding, [ ]  denotes negative finding Cardiac  Comments:  Chest pain or chest pressure:    Shortness of breath upon  exertion:    Short of breath when lying flat:    Irregular heart rhythm:        Vascular    Pain in calf, thigh, or hip brought on by ambulation:    Pain in feet at night that wakes you up from your sleep:     Blood clot in your veins:    Leg swelling:         Pulmonary    Oxygen at home:    Productive cough:     Wheezing:         Neurologic    Sudden weakness in arms or legs:     Sudden numbness in arms or legs:     Sudden onset of difficulty speaking or slurred speech:    Temporary loss of vision in one eye:     Problems with dizziness:         Gastrointestinal    Blood in stool:     Vomited blood:         Genitourinary    Burning when urinating:      Blood in urine:        Psychiatric    Major depression:         Hematologic    Bleeding problems:    Problems with blood clotting too easily:        Skin    Rashes or ulcers:        Constitutional    Fever or chills:     PHYSICAL EXAM:   Vitals:   12/18/21 1320  BP: (!) 137/94  Pulse: (!) 103  Resp: 16  Temp: 99 F (37.2 C)  TempSrc: Temporal  Weight: 246 lb (111.6 kg)  Height: 5\' 8"  (1.727 m)    GENERAL: The patient is a well-nourished female, in no acute distress. The vital signs are documented above. CARDIAC: There is a regular rate and rhythm.  VASCULAR: I do not detect carotid bruits. Both feet are warm and well-perfused.  She has no significant lower extremity swelling. PULMONARY: There is good air exchange bilaterally without wheezing or rales.  MUSCULOSKELETAL: There are no major deformities or cyanosis. NEUROLOGIC: No focal weakness or paresthesias are detected. SKIN: There are no ulcers or rashes noted. PSYCHIATRIC: The patient has a normal affect.  DATA:    VENOUS DUPLEX: I reviewed the venous duplex scan that was done in September.  This shows complete resolution of the clot in her common iliac, external iliac, common femoral, and femoral veins.  There was some chronic clot in the posterior tibial and peroneal veins.  Deitra Mayo [facility] [phone]

## 2021-12-23 ENCOUNTER — Other Ambulatory Visit: Payer: Self-pay | Admitting: *Deleted

## 2021-12-23 DIAGNOSIS — I82402 Acute embolism and thrombosis of unspecified deep veins of left lower extremity: Secondary | ICD-10-CM

## 2022-03-04 ENCOUNTER — Encounter: Payer: Self-pay | Admitting: Neurology

## 2022-03-04 ENCOUNTER — Ambulatory Visit: Payer: Medicaid Other | Admitting: Neurology

## 2022-03-04 VITALS — BP 154/110 | HR 126 | Ht 68.0 in | Wt 268.5 lb

## 2022-03-04 DIAGNOSIS — E512 Wernicke's encephalopathy: Secondary | ICD-10-CM

## 2022-03-04 DIAGNOSIS — R9089 Other abnormal findings on diagnostic imaging of central nervous system: Secondary | ICD-10-CM | POA: Diagnosis not present

## 2022-03-04 DIAGNOSIS — E519 Thiamine deficiency, unspecified: Secondary | ICD-10-CM

## 2022-03-04 DIAGNOSIS — R269 Unspecified abnormalities of gait and mobility: Secondary | ICD-10-CM

## 2022-03-04 DIAGNOSIS — H469 Unspecified optic neuritis: Secondary | ICD-10-CM

## 2022-03-04 NOTE — Progress Notes (Signed)
? ?[facility] ? ?PATIENT: Diane Todd ?DOB: [birth year] ? ?REFERRING DOCTOR OR PCP: Dr. Olena LeatherwoodHasanaj ?SOURCE: Patient, notes from primary care, notes from hospital, laboratory and imaging reports, imaging personally reviewed. ? ?_________________________________ ? ? ?HISTORICAL ? ?CHIEF COMPLAINT:  ?Chief Complaint  ?Patient presents with  ? Follow-up  ?  Rm 1, alone. Here for 6 month f/u. Pt reports doing well. Eliquis has been d/c.   ? ? ?HISTORY OF PRESENT ILLNESS:  ? Diane Todd is a 21 y.o. woman with sequelae of thiamine deficiency ? ?Update 03/04/2022: ?She continued to improve and feels close to baseline.   She no longer has vomiting or abdominal symptoms.   Appetite is good npw.       ? ?Cognition is back to baseline.   Vision is back to baseline.   Gait is much better than last visit and she could walk one hour if she had to but would not be able to keep up with peers for more than a few minutes.   ? ?She is taking thiamine supplements and MVI.  Thiamine deficiency likely occurred due to 2 months of nausea and vomiting and very poor p.o. intake she lost 50 pounds for those 2 months. ? ?She had presented in June 2022 with severe neurologic deficits and an abnormal brain MRI.  In the hospital, she was felt to possibly have neuromyelitis optica.  However, thiamine was deficient, MRI of the spinal cord was negative and anti-NMO antibodies were negative making the diagnosis of neuromyelitis optica less likely than thiamine deficiency. ? ? ?History of neurologic symptoms ?She began to experience various neurologic symptoms in June 2022, couple months after she had severe nutritional deficits.   She had nausea but not vomiting starting around April but then had vomiting as well in May into June.   She lost about 50 pounds during that time.  She feels her nutritional intake was near zero for a couple months .  She got some liquids in but no solid food.  [facility]  and  [facility] a few times for hydration.    She had H. Pylori on EGD but was unable to swallow the pills.    ? ?In June 2022, she had the onset of blurry vision followed by weakness and reduced ability to walk   Her memory was impaired.   She does not believe she experienced any neurologic deficits in April or May.  At the time, she was eating poorly.   No alcohol use.   She had an MRI 05/02/2021 showing increased T2 hyperintensity in periaqueductal grey matter and midline thalamus.   She was admitted and had additional scans and evaluation.     She reports that the nausea started April 2022 but difficulties with strength/gait did not start until early June.   ? ?She was admitted May 02, 2021 and had testing.  Thiamine was low and was infused.   She has no memory of events from about 04/26/21 through late July 2022.    She feels much better cognitively and now just feels minimally forgetful.    She is still not able to walk due to weakness.  Additionally, she has a left DVT that is painful and is limiting her walking.  She is doing therapy.  Arms are just slightly weak.   She has mild tremor at times.   She feels vision is better and she is seeing fairly clearly now.    Bladder is doing  well.     ? ?She was discharged in early July but developed a left DVT and was readmitted and started on Eliquis.   She denies SOB.  When thiamine was rechecked during that admission, it was normal.  A repeat MRI during that admission showed improved appearance of the T2 hyperintense changes and no further enhancement she denies palpitations but has a rapid heart rate.   ? ?She began to have less nausea in late June and has beemneating well since late June 2022.   She received IV thiamine, steroids and plasmapheresis while hositalized in June 2022.     ? ? ? ?Imaging and other data ?MRI of the head 05/04/2021 showed T2 hyperintensity in the periaqueductal/periventricular gray matter of the medulla, pons and midbrain.  Similarly in the medial  thalamus.  The T2 hyperintense areas also have mild enhancement after contrast. ? ?MRI of the cervical and thoracic spine 05/04/2021 was normal. ? ?MRI of the orbits 05/05/2021 we demonstrated the T2 hyperintensity with subtle enhancement of the periaqueductal gray matter and medial thalamus.  The orbits were normal. ? ?MRI of the head 06/11/2021 showed marked improvement of the T2/FLAIR hyperintensity in the periaqueductal gray matter and medial thalamus though some residual changes were noted.  There was no abnormal enhancement. ? ?Laboratory studies from June 2022: ?Neuromyelitis optica IgG 05/14/2021 was negative. ? ?CSF 05/13/2021 showed an increased IgG index but no oligoclonal bands there were 8 white blood cells, normal protein, elevated glucose. ? ?Thiamine 05/13/2021 was low at 31 and 38 (66-200 normal).  When we checked 06/15/2021 it was normal ? ?HIV negative.  Creatine kinase 06/11/2021 was elevated at 1500.  ANA was negative.  Lupus anticoagulant and DRV VT was elevated.  Indeterminate anticardiolipin IgM was noted.  DRV VT confirm was slightly elevated. ? ? ?REVIEW OF SYSTEMS: ?Constitutional: No fevers, chills, sweats, or change in appetite ?Eyes: No visual changes, double vision, eye pain ?Ear, nose and throat: No hearing loss, ear pain, nasal congestion, sore throat ?Cardiovascular: No chest pain, palpitations ?Respiratory:  No shortness of breath at rest or with exertion.   No wheezes ?GastrointestinaI: No nausea, vomiting, diarrhea, abdominal pain, fecal incontinence ?Genitourinary:  No dysuria, urinary retention or frequency.  No nocturia. ?Musculoskeletal:  No neck pain, back pain ?Integumentary: No rash, pruritus, skin lesions ?Neurological: as above ?Psychiatric: No depression at this time.  No anxiety ?Endocrine: No palpitations, diaphoresis, change in appetite, change in weigh or increased thirst ?Hematologic/Lymphatic:  No anemia, purpura, petechiae. ?Allergic/Immunologic: No itchy/runny eyes,  nasal congestion, recent allergic reactions, rashes ? ?ALLERGIES: ?No Known Allergies ? ?HOME MEDICATIONS: ? ?Current Outpatient Medications:  ?  levothyroxine (SYNTHROID) 100 MCG tablet, Take 1 tablet (100 mcg total) by mouth daily., Disp: 30 tablet, Rfl: 0 ?  propranolol (INDERAL) 10 MG tablet, Take 1 tablet (10 mg total) by mouth daily., Disp: 30 tablet, Rfl: 0 ?  thiamine 100 MG tablet, Take 1 tablet (100 mg total) by mouth daily., Disp: 30 tablet, Rfl: 3 ? ?PAST MEDICAL HISTORY: ?Past Medical History:  ?Diagnosis Date  ? Hypertension   ? Hypothyroidism   ? ? ?PAST SURGICAL HISTORY: ?Past Surgical History:  ?Procedure Laterality Date  ? IR FLUORO GUIDE CV LINE RIGHT  05/08/2021  ? IR US GUIDE VASC ACCESS RIGHT  05/08/2021  ? PERIPHERAL VASCULAR THROMBECTOMY N/A 06/14/2021  ? Procedure: PERIPHERAL VASCULAR THROMBECTOMY;  Surgeon: Sherren Kerns, MD;  Location: [facility] INVASIVE CV LAB;  Service: Cardiovascular;  Laterality: N/A;  ? ? ?  FAMILY HISTORY: ?Family History  ?Problem Relation Age of Onset  ? Hypertension Mother   ? Diabetes Mellitus II Other   ? ? ?SOCIAL HISTORY: ? ?Social History  ? ?Socioeconomic History  ? Marital status: Single  ?  Spouse name: Not on file  ? Number of children: 0  ? Years of education: 64  ? Highest education level: Not on file  ?Occupational History  ? Not on file  ?Tobacco Use  ? Smoking status: Former  ?  Types: E-cigarettes  ? Smokeless tobacco: Never  ?Vaping Use  ? Vaping Use: Some days  ? Substances: Nicotine, Flavoring  ?Substance and Sexual Activity  ? Alcohol use: Not Currently  ? Drug use: Not Currently  ? Sexual activity: Not Currently  ?Other Topics Concern  ? Not on file  ?Social History Narrative  ? Right handed  ? Caffeine use: soda (1 per day)  ? Lives with parents  ? ?Social Determinants of Health  ? ?Financial Resource Strain: Not on file  ?Food Insecurity: Not on file  ?Transportation Needs: Not on file  ?Physical Activity: Not on file  ?Stress: Not on file  ?Social  Connections: Not on file  ?Intimate Partner Violence: Not on file  ? ? ? ?PHYSICAL EXAM ? ?Vitals:  ? 03/04/22 1453  ?BP: (!) 154/110  ?Pulse: (!) 126  ?Weight: 268 lb 8 oz (121.8 kg)  ?Height: 5\' 8"  (1.

## 2022-03-12 ENCOUNTER — Telehealth: Payer: Self-pay | Admitting: Neurology

## 2022-03-12 NOTE — Telephone Encounter (Signed)
[facility] [id number] (exp. 03/12/22 to 05/11/22) order faxed to [facility], they will reach out to the patient to schedule  ?

## 2022-03-12 NOTE — Telephone Encounter (Signed)
[facility] pending faxed notes.  °

## 2022-03-17 NOTE — Telephone Encounter (Signed)
Diane Todd sent me a message stating: ? ?"I have been unable to reach this patient" ?

## 2022-04-21 NOTE — Telephone Encounter (Signed)
Called and left another message. If the patient is in office sometime please give our number to schedule.  [phone] option #1 for scheduling.

## 2022-07-14 ENCOUNTER — Telehealth: Payer: Self-pay | Admitting: Neurology

## 2022-07-14 NOTE — Telephone Encounter (Signed)
[facility] Diane Todd) insurance needing more information for MRI schedule at [facility]. Insurance faxed over requested information need to approve.

## 2022-07-16 NOTE — Telephone Encounter (Signed)
I sent the additional information needed. Auth still pending.

## 2022-07-17 ENCOUNTER — Encounter: Payer: Self-pay | Admitting: Neurology

## 2022-07-24 IMAGING — MR MR ORBITS WO/W CM
4 of 8 series · 19 of 48 positions shown · IV contrast (gadavist)
Comparison: Abnormal MRI of the brain.

CLINICAL DATA: Optic neuritis suspected.

EXAM:
MRI OF THE ORBITS WITHOUT AND WITH CONTRAST
TECHNIQUE: Multiplanar, multi-echo pulse sequences of the orbits and
surrounding structures were acquired including fat saturation
techniques, before and after intravenous contrast administration.
CONTRAST:  10mL GADAVIST GADOBUTROL 1 MMOL/ML IV SOLN

[Series 2: FLAIR · sagittal · 5.0mm · 0.47mm/px · 6 of 26 slices shown]
[im 1/26]
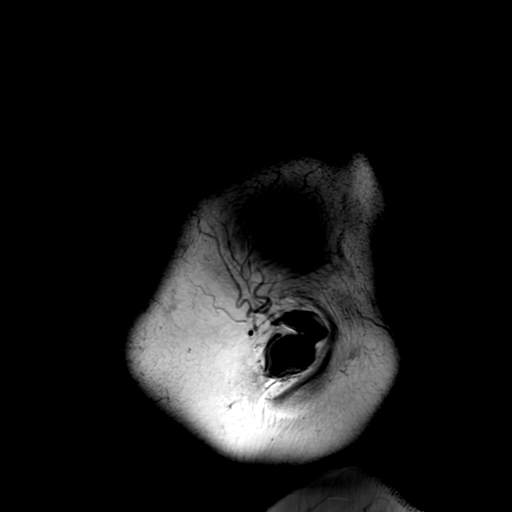
[im 6/26]
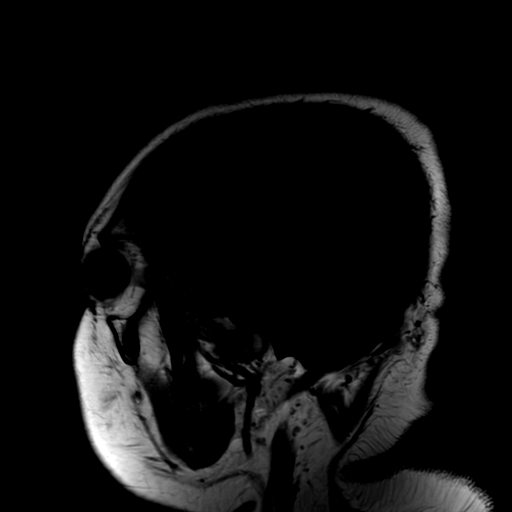
[im 11/26]
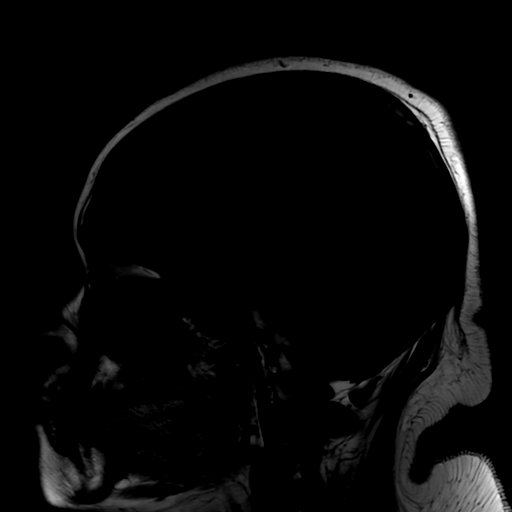
[im 16/26]
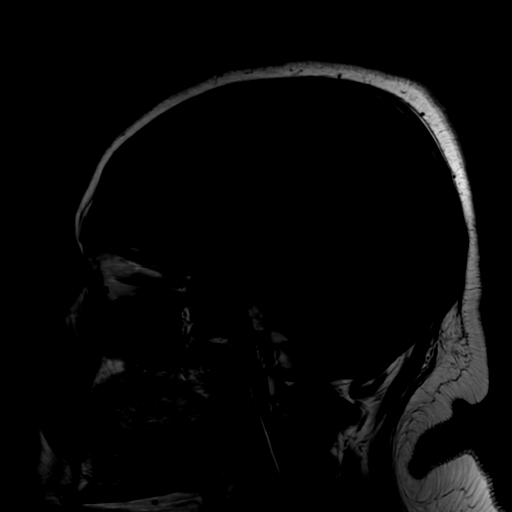
[im 21/26]
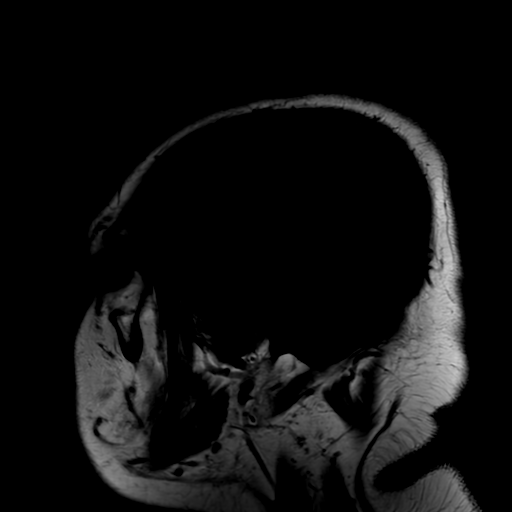
[im 26/26]
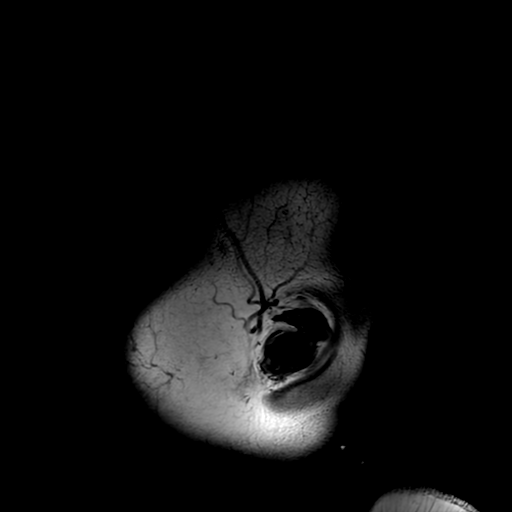

[Series 3: T2 fat-sat · axial · 3.0mm · 0.35mm/px · z∈[-58,+10]mm · 5 of 24 slices shown (1 of 2)]
[im 1/24]
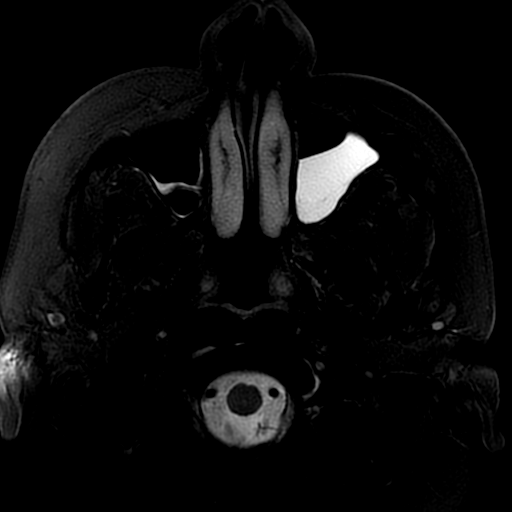
[im 6/24]
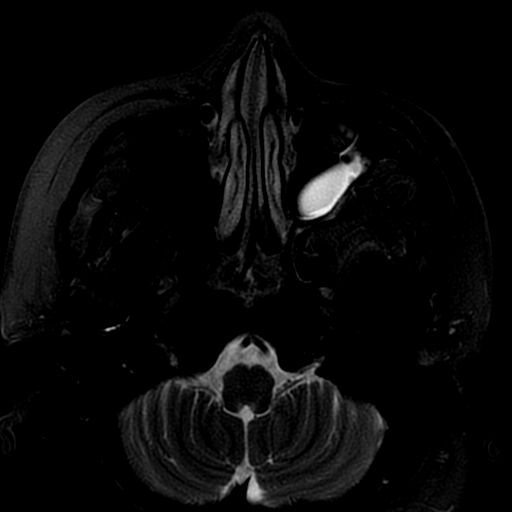
[im 12/24]
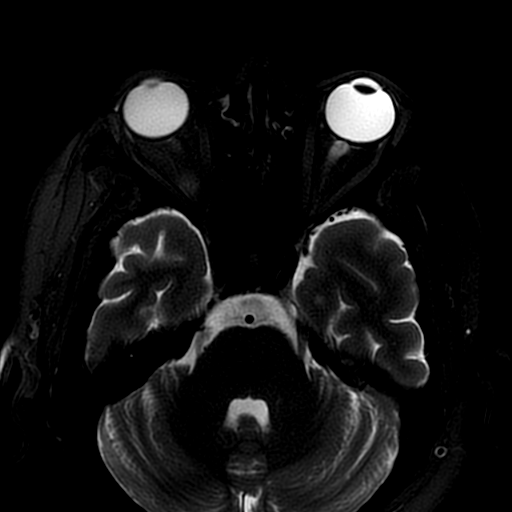
[im 18/24]
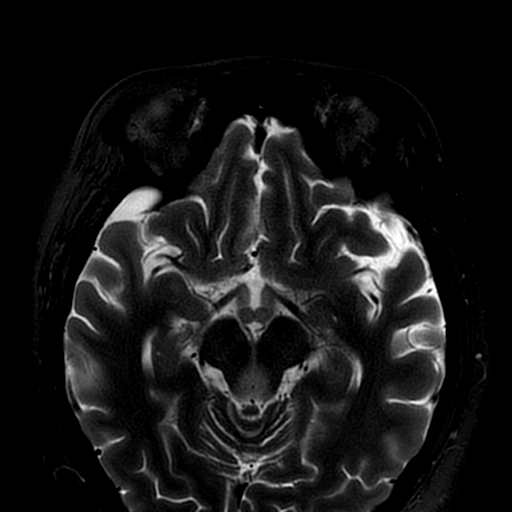
[im 24/24]
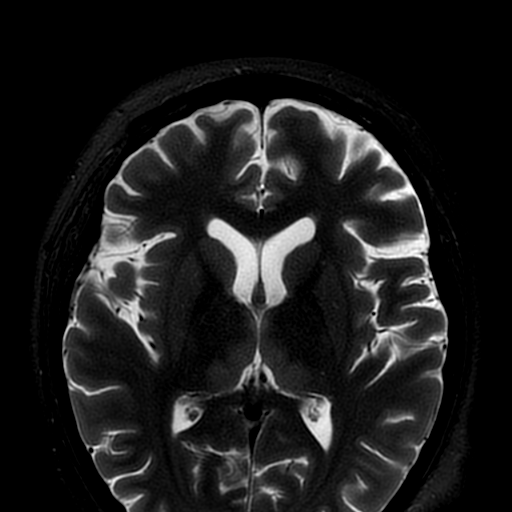

[Series 4: T2 fat-sat · coronal · 4.0mm · 0.35mm/px · 5 of 23 slices shown (2 of 2)]
[im 1/23]
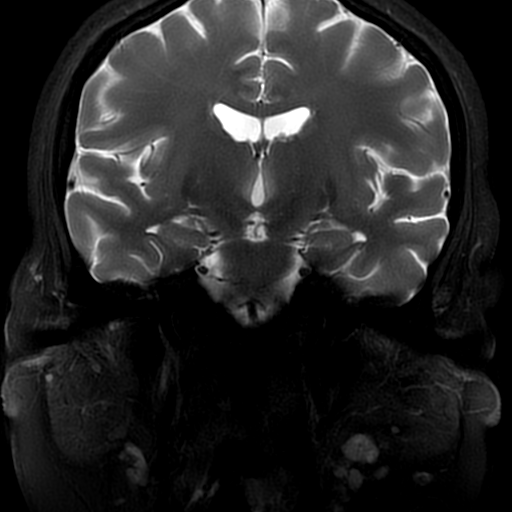
[im 6/23]
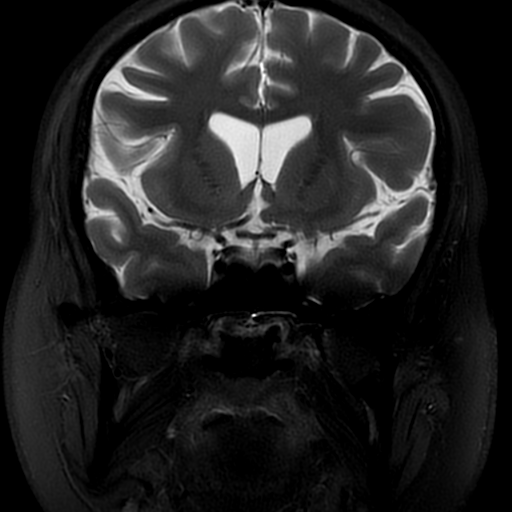
[im 12/23]
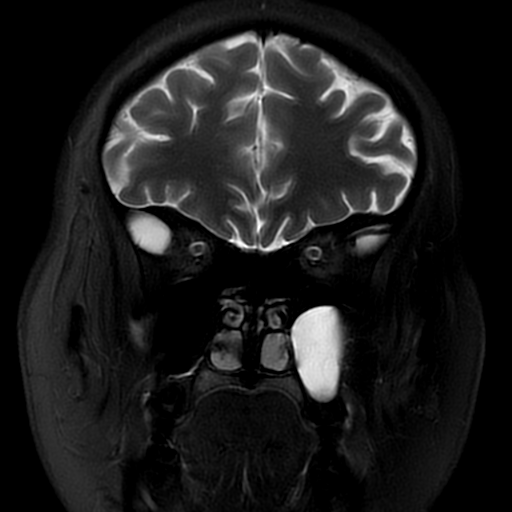
[im 17/23]
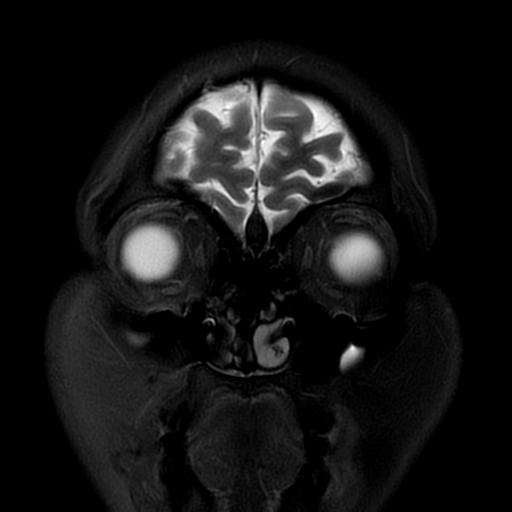
[im 23/23]
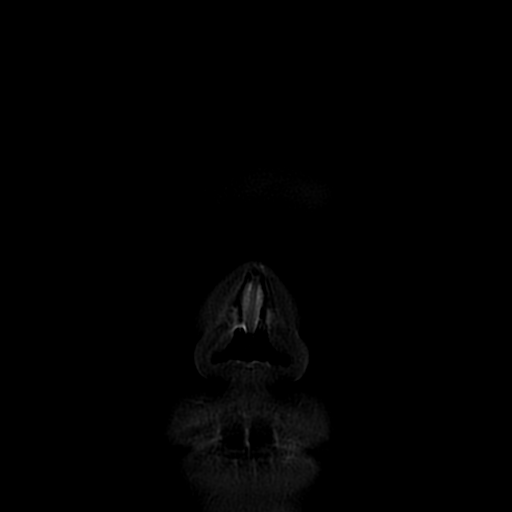

[Series 5: T1 · coronal · 4.0mm · 0.35mm/px · 3 of 23 slices shown]
[im 1/23]
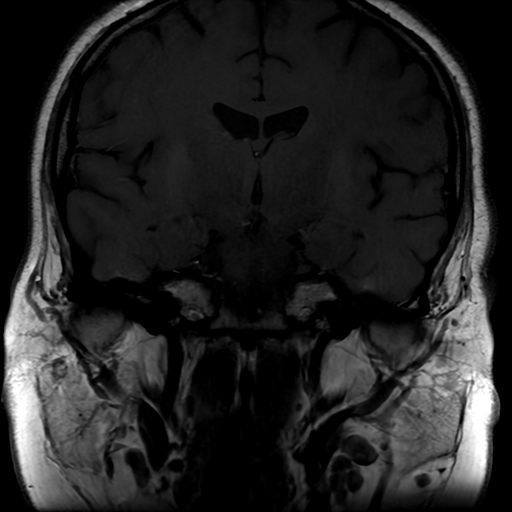
[im 12/23]
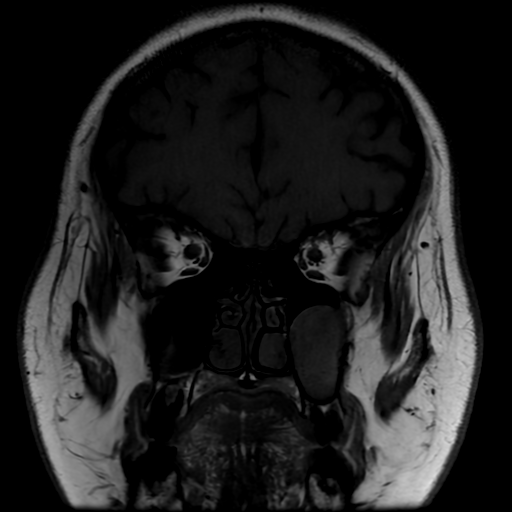
[im 23/23]
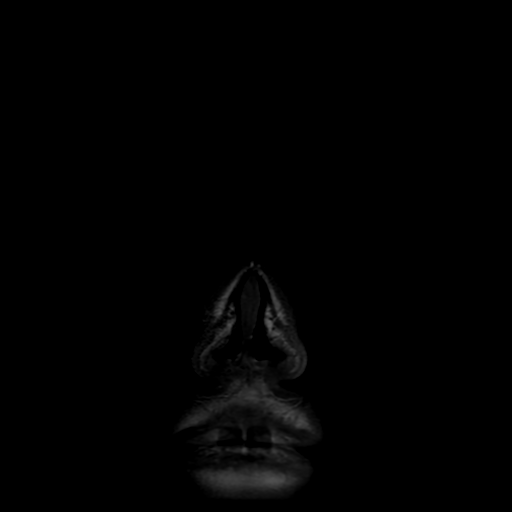

[19 of 48 positions shown; findings below may reference images not displayed]

FINDINGS: Orbits: Globes are within normal limits bilaterally. The optic
nerves are unremarkable. Chiasm is within normal limits. Abnormal
signal or enhancement is present.

Extraocular muscles are within normal limits.

Visualized sinuses: Polyp or mucous retention cyst is noted in the
left maxillary sinus. The paranasal sinuses and mastoid air cells
are otherwise clear.

Soft tissues: Periorbital soft tissues and visualized facial soft
tissues are within normal limits.

Limited intracranial: Developmental venous anomaly left temporal
lobe is again seen. There is subtle enhancement along the
periaqueductal gray matter and medial thalami.
IMPRESSION: 1. Normal MRI appearance of the orbits.
2. Subtle enhancement along the periaqueductal gray matter and
medial thalami bilaterally. This corresponds to the abnormal signal
changes on the MRI from yesterday. In addition to a demyelinating
process, or Wernicke encephalopathy (thiamine deficiency
encephalopathy) is also considered.

## 2022-07-27 IMAGING — RF DG SPINAL PUNCT LUMBAR DIAG WITH FL CT GUIDANCE
1 series · 1 of 1 positions shown · non-contrast
Comparison: None

CLINICAL DATA: Optic neuritis, altered mental status

EXAM:
DIAGNOSTIC LUMBAR PUNCTURE UNDER FLUOROSCOPIC GUIDANCE

[Series 1: cp_standard · 0.26mm/px · 1 of 1 slices shown]
[im 1/1]
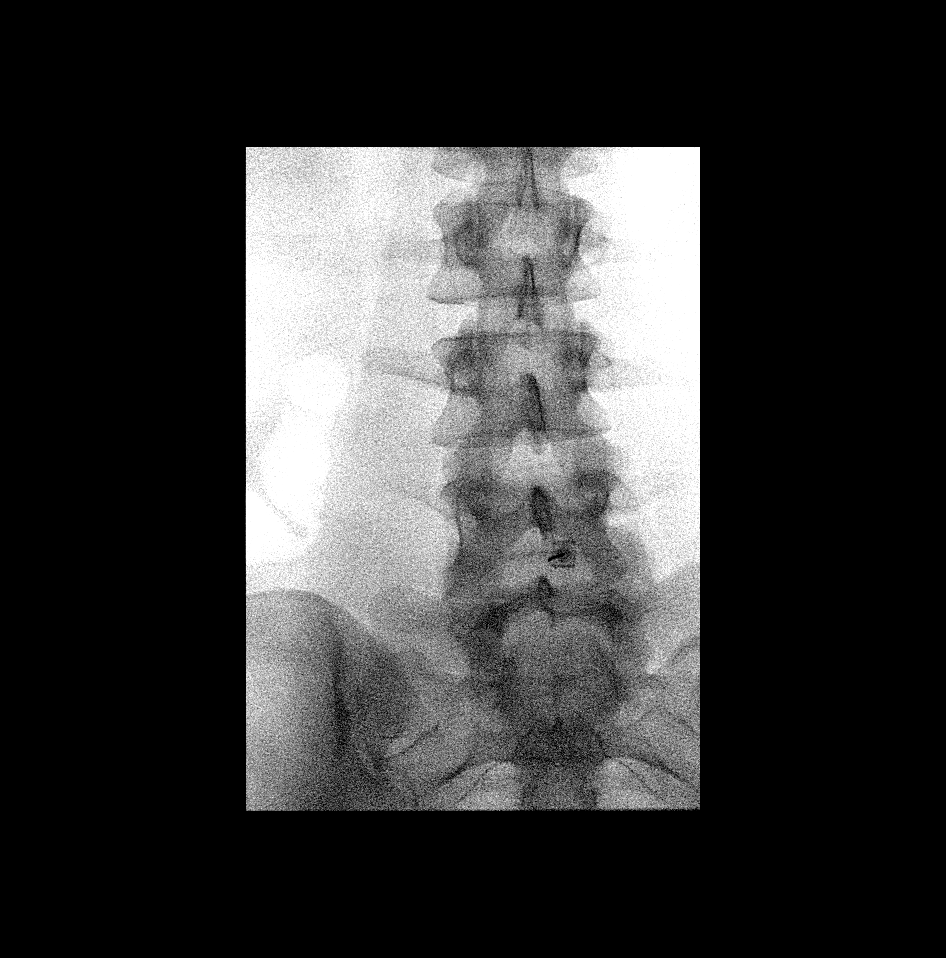

[1 of 1 positions shown; findings below may reference images not displayed]

FLUOROSCOPY TIME:  Fluoroscopy Time:  12 seconds

Radiation Exposure Index (if provided by the fluoroscopic device):
1.5 mGy

Number of Acquired Spot Images: 0

PROCEDURE:
Informed consent was obtained from the patient prior to the
procedure, including potential complications of headache, allergy,
and pain. With the patient prone, the lower back was prepped with
Betadine. 1% Lidocaine was used for local anesthesia. Lumbar
puncture was performed at the L4-L5 level using a 20 gauge needle
with return of clear CSF with an opening pressure of 17 cm water.
Approximately 13 ml of CSF were obtained for laboratory studies. The
patient tolerated the procedure well and there were no apparent
complications.
IMPRESSION: Successful lumbar puncture. Approximately 13 mL of clear CSF were
obtained and sent to the laboratory for analysis. There were no
immediate complications.

## 2024-12-13 ENCOUNTER — Ambulatory Visit: Admitting: Physician Assistant

## 2024-12-13 ENCOUNTER — Ambulatory Visit (INDEPENDENT_AMBULATORY_CARE_PROVIDER_SITE_OTHER): Admitting: Student in an Organized Health Care Education/Training Program

## 2024-12-13 ENCOUNTER — Encounter: Payer: Self-pay | Admitting: Student in an Organized Health Care Education/Training Program

## 2024-12-13 VITALS — BP 152/100 | HR 75 | Temp 98.4°F | Ht 67.0 in | Wt 195.6 lb

## 2024-12-13 DIAGNOSIS — E039 Hypothyroidism, unspecified: Secondary | ICD-10-CM

## 2024-12-13 DIAGNOSIS — Z131 Encounter for screening for diabetes mellitus: Secondary | ICD-10-CM | POA: Diagnosis not present

## 2024-12-13 DIAGNOSIS — E041 Nontoxic single thyroid nodule: Secondary | ICD-10-CM

## 2024-12-13 DIAGNOSIS — Z8639 Personal history of other endocrine, nutritional and metabolic disease: Secondary | ICD-10-CM | POA: Diagnosis not present

## 2024-12-13 DIAGNOSIS — E519 Thiamine deficiency, unspecified: Secondary | ICD-10-CM

## 2024-12-13 DIAGNOSIS — F411 Generalized anxiety disorder: Secondary | ICD-10-CM | POA: Diagnosis not present

## 2024-12-13 DIAGNOSIS — Z13220 Encounter for screening for lipoid disorders: Secondary | ICD-10-CM | POA: Diagnosis not present

## 2024-12-13 DIAGNOSIS — F5081 Binge eating disorder, mild: Secondary | ICD-10-CM | POA: Diagnosis not present

## 2024-12-13 DIAGNOSIS — I10 Essential (primary) hypertension: Secondary | ICD-10-CM

## 2024-12-13 DIAGNOSIS — Z114 Encounter for screening for human immunodeficiency virus [HIV]: Secondary | ICD-10-CM

## 2024-12-13 DIAGNOSIS — Z1159 Encounter for screening for other viral diseases: Secondary | ICD-10-CM

## 2024-12-13 LAB — POCT URINE PREGNANCY: Preg Test, Ur: NEGATIVE

## 2024-12-13 MED ORDER — THIAMINE HCL 100 MG PO TABS: 100.0000 mg | ORAL_TABLET | Freq: Every day | ORAL | 3 refills | Status: AC

## 2024-12-13 MED ORDER — VITAMIN B-12 1000 MCG PO TABS: 1000.0000 ug | ORAL_TABLET | Freq: Every day | ORAL | 1 refills | Status: AC

## 2024-12-13 MED ORDER — SERTRALINE HCL 50 MG PO TABS: ORAL_TABLET | ORAL | 0 refills | Status: AC

## 2024-12-13 NOTE — Progress Notes (Signed)
 "  New Patient Office Visit  Patient ID: Diane Todd, Female   DOB: 01-03-2001 23 y.o. MRN: [id number]  Chief Complaint  Patient presents with   Establish Care   Subjective:     Diane Todd presents to establish care  HPI  Discussed the use of AI scribe software for clinical note transcription with the patient, who gave verbal consent to proceed.  History of Present Illness Diane Todd is a 24 year old female who presents with weakness, fatigue, and nausea.  She has been experiencing weakness, fatigue, and nausea since December 09, 2024, accompanied by lightheadedness and dizziness upon standing, which resolve quickly. These symptoms are reminiscent of a previous hospitalization in 2022 for suspected neuromyelitis optica, which was reconsidered after negative anti-NMO antibodies and improvement with thiamine  supplementation.  Chronic nausea and vomiting have been persistent issues. She avoids eating when nauseous due to fear of vomiting, although actual vomiting is rare. Nausea is present daily but typically improves after eating. She was previously diagnosed with H. pylori, which was treated, and her last endoscopy in 2024 showed no abnormalities.  Her diet is poor, consisting mainly of oatmeal, Lean Cuisine meals, bread, rice, vegetables, and fruits. She is a vegetarian due to an aversion to meat and does not supplement her diet with protein sources or multivitamins. She has a history of thiamine  deficiency.  She has a history of left-sided deep vein thrombosis (DVT) treated with Eliquis  for six months post-hospitalization in 2022, with no recurrence of DVT symptoms. She completed physical therapy but still occasionally struggles with balance.  She experiences regular anxiety but has not sought treatment. She acknowledges that anxiety may contribute to her nausea.  She has a history of thyroid  issues and was previously on levothyroxine  but has not taken it for  about two years.  She lives with her parents and siblings, works full-time as a psychologist, clinical, and has no alcohol, marijuana, or nicotine use. She reports fluctuating weight, with recent weight loss from the mid-220s to 195 pounds. Her sleep is interrupted, waking once per night, and she is not currently sexually active.  No changes in vision, no shortness of breath, no abdominal pain, and stable mood with some irritability. She reports regular menstrual cycles, with the last period on November 21, 2024.   Outpatient Encounter Medications as of 12/13/2024  Medication Sig   cyanocobalamin (VITAMIN B12) 1000 MCG tablet Take 1 tablet (1,000 mcg total) by mouth daily.   sertraline  (ZOLOFT ) 50 MG tablet Take 0.5 tablets (25 mg total) by mouth daily for 8 days, THEN 1 tablet (50 mg total) daily for 26 days.   thiamine  (VITAMIN B1) 100 MG tablet Take 1 tablet (100 mg total) by mouth daily.   [DISCONTINUED] levothyroxine  (SYNTHROID ) 100 MCG tablet Take 1 tablet (100 mcg total) by mouth daily.   [DISCONTINUED] propranolol  (INDERAL ) 10 MG tablet Take 1 tablet (10 mg total) by mouth daily.   [DISCONTINUED] thiamine  100 MG tablet Take 1 tablet (100 mg total) by mouth daily.   No facility-administered encounter medications on file as of 12/13/2024.    Past Medical History:  Diagnosis Date   DVT (deep venous thrombosis) (HCC) 06/10/2021   Helicobacter pylori gastritis 04/18/2021   Hypertension    Hypothyroidism    Wernicke's encephalopathy 09/02/2021    Past Surgical History:  Procedure Laterality Date   IR FLUORO GUIDE CV LINE RIGHT  05/08/2021   IR US  GUIDE VASC ACCESS RIGHT  05/08/2021   PERIPHERAL VASCULAR THROMBECTOMY  N/A 06/14/2021   Procedure: PERIPHERAL VASCULAR THROMBECTOMY;  Surgeon: Harvey Carlin BRAVO, MD;  Location: [facility] INVASIVE CV LAB;  Service: Cardiovascular;  Laterality: N/A;    Family History  Problem Relation Age of Onset   Hypertension Mother    Diabetes Mellitus II Other        Objective:    BP (!) 152/100   Pulse 75   Temp 98.4 F (36.9 C)   Ht 5' 7 (1.702 m)   Wt 195 lb 9.6 oz (88.7 kg)   LMP 11/21/2024 (Exact Date)   BMI 30.64 kg/m   Physical Exam  Gen: Well-appearing woman Ears: Normal hearing, normal tympanic membranes bilaterally Eyes: Normal vision, normal peripheral fields, extraocular motion is normal Neck: Enlarged thyroid  diffusely, there is a 2 cm palpable thyroid  nodule on the right side, no adenopathy Heart: Regular, no murmur Lungs: Unlabored, clear throughout Abd: Striae present, nontender, no organomegaly Ext: Warm, no edema Neuro: Alert, conversational, normal get up and go, a little unsteady getting up onto the table, full strength in the upper and lower extremities, normal cranial nerves, normal sensation throughout Psych: Linear speech, organized thoughts     Assessment & Plan:   Problem List Items Addressed This Visit       High   Thiamine  deficiency (Chronic)   Unusual history of a thiamine  deficiency causing likely Warnicke encephalopathy in 2022.  No alcohol use, rather she has chronic issues with nausea which limits her intake of food.  Currently only eating about 1-2 meals per day.  She is having recurrent symptoms of generalized weakness, mental fogginess, and some dizziness on standing.  Questionable history in the past of NMO based on an MRI, had consultation with Dr. Pamela with neurology, anti-NMO antibodies were negative and the MRI of the spine was normal.  She responded well to thiamine  deficiency in the past without need for immunosuppression, so felt less likely to be NMO is more likely to be thiamine  deficiency causing her symptoms.  As such I recommended that she resume thiamine  supplementation, and I have sent in a prescription to her pharmacy.      Relevant Medications   thiamine  (VITAMIN B1) 100 MG tablet   Generalized anxiety disorder - Primary (Chronic)   Chronic issue.  Has been resistant to  medications in the past.  Generalized anxiety affects multiple aspects of her daily life including work.  She has anxiety about nausea and vomiting which restricts the amount that she is eating.  Denies any history of bipolar disease or other high risk psychiatric conditions.  Low risk for self-harm.  She is open to treatment with an SSRI at this point.  We talked about starting sertraline .  I recommended sertraline  25 mg for 1 week and then increase to 50 mg.  Follow-up with me closely in 1 month.      Relevant Medications   sertraline  (ZOLOFT ) 50 MG tablet   Other Relevant Orders   POCT urine pregnancy     Medium    Hypothyroidism (Chronic)   History of hypothyroidism with thyroid  enlargement.  Probably a goiter.  Previously was on treatment with levothyroxine  100 mcg daily but she discontinued this about 18 months ago.  Now having symptoms of fatigue, weakness, and mental fogginess.  Will recheck TSH and free T4 today but anticipate will need to restart some dosage of levothyroxine .      Relevant Orders   TSH   T4, free   Thyroid  nodule (Chronic)   Chronic enlargement  of the thyroid , with a palpable nodule on the right side.  I think this is most likely a multinodular goiter.  Will get an ultrasound.      Relevant Orders   US  THYROID    Hypertension (Chronic)   New issue of hypertension in a young person.  She has got a lot going on right now with uncontrolled anxiety, untreated hypothyroidism, all of which could be contributing to her hypertension.  I want to follow-up with this in 4 weeks with blood pressure recheck.  Check labs today including renal function.        Low   History of malnutrition (Chronic)   Patient with a history of malnutrition and restricted oral intake because of anxiety around nausea.  She has had to eat endoscopies, most recently in 2024 without signs of structural disease causing her nausea.  No recent pain or emesis.  Abdominal exam is reassuring.  We are  going start treating the anxiety with Zoloft .  I recommended treatment of malnutrition with supplemental vitamins including a thiamine  supplements and B12 supplement because she is vegetarian.  I recommended a multivitamin.  Will also refer to nutrition.  She has good insight and is open to making changes, would really like to see her get back to unrestricted eating.      Relevant Medications   thiamine  (VITAMIN B1) 100 MG tablet   cyanocobalamin (VITAMIN B12) 1000 MCG tablet   Other Relevant Orders   Comprehensive metabolic panel with GFR   CBC   IBC + Ferritin   Magnesium    Vitamin B12   Amb ref to Medical Nutrition Therapy-MNT   Other Visit Diagnoses       Screening for lipid disorders       Relevant Orders   Lipid panel     Screening for diabetes mellitus       Relevant Orders   Hemoglobin A1c     Screening for HIV (human immunodeficiency virus)       Relevant Orders   HIV Antibody (routine testing w rflx)     Encounter for HCV screening test for low risk patient       Relevant Orders   Hepatitis C antibody       Return in about 4 weeks (around 01/10/2025).   Cleatus Debby Specking, MD [facility]     "

## 2024-12-13 NOTE — Assessment & Plan Note (Signed)
 Unusual history of a thiamine  deficiency causing likely Warnicke encephalopathy in 2022.  No alcohol use, rather she has chronic issues with nausea which limits her intake of food.  Currently only eating about 1-2 meals per day.  She is having recurrent symptoms of generalized weakness, mental fogginess, and some dizziness on standing.  Questionable history in the past of NMO based on an MRI, had consultation with Dr. Pamela with neurology, anti-NMO antibodies were negative and the MRI of the spine was normal.  She responded well to thiamine  deficiency in the past without need for immunosuppression, so felt less likely to be NMO is more likely to be thiamine  deficiency causing her symptoms.  As such I recommended that she resume thiamine  supplementation, and I have sent in a prescription to her pharmacy.

## 2024-12-13 NOTE — Assessment & Plan Note (Signed)
 Patient with a history of malnutrition and restricted oral intake because of anxiety around nausea.  She has had to eat endoscopies, most recently in 2024 without signs of structural disease causing her nausea.  No recent pain or emesis.  Abdominal exam is reassuring.  We are going start treating the anxiety with Zoloft .  I recommended treatment of malnutrition with supplemental vitamins including a thiamine  supplements and B12 supplement because she is vegetarian.  I recommended a multivitamin.  Will also refer to nutrition.  She has good insight and is open to making changes, would really like to see her get back to unrestricted eating.

## 2024-12-13 NOTE — Assessment & Plan Note (Signed)
 New issue of hypertension in a young person.  She has got a lot going on right now with uncontrolled anxiety, untreated hypothyroidism, all of which could be contributing to her hypertension.  I want to follow-up with this in 4 weeks with blood pressure recheck.  Check labs today including renal function.

## 2024-12-13 NOTE — Assessment & Plan Note (Signed)
 History of hypothyroidism with thyroid  enlargement.  Probably a goiter.  Previously was on treatment with levothyroxine  100 mcg daily but she discontinued this about 18 months ago.  Now having symptoms of fatigue, weakness, and mental fogginess.  Will recheck TSH and free T4 today but anticipate will need to restart some dosage of levothyroxine .

## 2024-12-13 NOTE — Patient Instructions (Signed)
" °  VISIT SUMMARY: During your visit, we discussed your ongoing symptoms of weakness, fatigue, and nausea. 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 We also talked about your diet, anxiety, and recent weight loss.  YOUR PLAN: -CHRONIC NAUSEA AND VOMITING ASSOCIATED WITH ANXIETY AND DIETARY DEFICIENCY: Your chronic nausea and vomiting are likely related to anxiety and dietary deficiencies, including a possible B12 deficiency due to your vegetarian diet. Anxiety can make these symptoms worse. We have prescribed Zoloft  to help manage your anxiety. We also ordered blood work to check for anemia, vitamin deficiencies, and thyroid  function. It is important to take a multivitamin, thiamine , and B12 supplements. We have referred you to a nutritionist to help improve your diet.  -HYPOTHYROIDISM: Hypothyroidism means your thyroid  gland is not producing enough thyroid  hormone. We need to reassess your thyroid  function since you have been off levothyroxine  for about two years. We have ordered thyroid  function tests and will consider restarting levothyroxine  based on the results.  -GENERAL HEALTH MAINTENANCE: We discussed the importance of a balanced diet, especially given your vegetarian diet, and the need for vitamin supplementation. Please take a multivitamin, thiamine , and B12 supplements regularly. Ensure your diet includes adequate protein.  INSTRUCTIONS: Please follow up with the blood work we ordered to check for anemia, vitamin deficiencies, and thyroid  function. Schedule an appointment with the nutritionist we referred you to for dietary guidance. We will review your thyroid  function test results and determine if you need to restart levothyroxine . Continue taking the prescribed Zoloft  for anxiety and the recommended vitamin  supplements.    Contains text generated by [employer].   "

## 2024-12-13 NOTE — Assessment & Plan Note (Signed)
 Chronic issue.  Has been resistant to medications in the past.  Generalized anxiety affects multiple aspects of her daily life including work.  She has anxiety about nausea and vomiting which restricts the amount that she is eating.  Denies any history of bipolar disease or other high risk psychiatric conditions.  Low risk for self-harm.  She is open to treatment with an SSRI at this point.  We talked about starting sertraline .  I recommended sertraline  25 mg for 1 week and then increase to 50 mg.  Follow-up with me closely in 1 month.

## 2024-12-13 NOTE — Assessment & Plan Note (Signed)
 Chronic enlargement of the thyroid , with a palpable nodule on the right side.  I think this is most likely a multinodular goiter.  Will get an ultrasound.

## 2024-12-14 ENCOUNTER — Telehealth: Payer: Self-pay

## 2024-12-14 LAB — HEMOGLOBIN A1C: Hgb A1c MFr Bld: 5.6 % (ref 4.6–6.5)

## 2024-12-14 LAB — HIV ANTIBODY (ROUTINE TESTING W REFLEX)
HIV 1&2 Ab, 4th Generation: NONREACTIVE
HIV FINAL INTERPRETATION: NEGATIVE

## 2024-12-14 LAB — COMPREHENSIVE METABOLIC PANEL WITH GFR
ALT: 12 U/L (ref 3–35)
AST: 15 U/L (ref 5–37)
Albumin: 4.4 g/dL (ref 3.5–5.2)
Alkaline Phosphatase: 63 U/L (ref 39–117)
BUN: 11 mg/dL (ref 6–23)
CO2: 26 meq/L (ref 19–32)
Calcium: 9.2 mg/dL (ref 8.4–10.5)
Chloride: 105 meq/L (ref 96–112)
Creatinine, Ser: 1.12 mg/dL (ref 0.40–1.20)
GFR: 69.25 mL/min
Glucose, Bld: 86 mg/dL (ref 70–99)
Potassium: 3.9 meq/L (ref 3.5–5.1)
Sodium: 138 meq/L (ref 135–145)
Total Bilirubin: 0.4 mg/dL (ref 0.2–1.2)
Total Protein: 7.6 g/dL (ref 6.0–8.3)

## 2024-12-14 LAB — HEPATITIS C ANTIBODY: Hepatitis C Ab: NONREACTIVE

## 2024-12-14 LAB — IBC + FERRITIN
Ferritin: 22.6 ng/mL (ref 10.0–291.0)
Iron: 25 ug/dL — ABNORMAL LOW (ref 42–145)
Saturation Ratios: 5.1 % — ABNORMAL LOW (ref 20.0–50.0)
TIBC: 494.2 ug/dL — ABNORMAL HIGH (ref 250.0–450.0)
Transferrin: 353 mg/dL (ref 212.0–360.0)

## 2024-12-14 LAB — MAGNESIUM: Magnesium: 1.8 mg/dL (ref 1.5–2.5)

## 2024-12-14 LAB — VITAMIN B12: Vitamin B-12: 222 pg/mL (ref 211–911)

## 2024-12-14 LAB — CBC
HCT: 45.9 % (ref 36.0–46.0)
Hemoglobin: 14.4 g/dL (ref 12.0–15.0)
MCHC: 31.4 g/dL (ref 30.0–36.0)
MCV: 81 fl (ref 78.0–100.0)
Platelets: 292 10*3/uL (ref 150.0–400.0)
RBC: 5.67 Mil/uL — ABNORMAL HIGH (ref 3.87–5.11)
RDW: 15.6 % — ABNORMAL HIGH (ref 11.5–15.5)
WBC: 5.6 10*3/uL (ref 4.0–10.5)

## 2024-12-14 LAB — LIPID PANEL
Cholesterol: 119 mg/dL (ref 28–200)
HDL: 32.9 mg/dL — ABNORMAL LOW
LDL Cholesterol: 60 mg/dL (ref 10–99)
NonHDL: 86.18
Total CHOL/HDL Ratio: 4
Triglycerides: 129 mg/dL (ref 10.0–149.0)
VLDL: 25.8 mg/dL (ref 0.0–40.0)

## 2024-12-14 LAB — TSH: TSH: 2.7 u[IU]/mL (ref 0.35–5.50)

## 2024-12-14 LAB — T4, FREE: Free T4: 0.94 ng/dL (ref 0.60–1.60)

## 2024-12-14 NOTE — Telephone Encounter (Signed)
 I think F50.8 is an appropriate diagnosis code for the patient.  Thank you.

## 2024-12-14 NOTE — Addendum Note (Signed)
 Addended by: Shallyn Constancio K on: 12/14/2024 04:35 PM   Modules accepted: Orders

## 2024-12-14 NOTE — Telephone Encounter (Signed)
 Is there an appropriate code I can call and provide to them for this patient?

## 2024-12-14 NOTE — Addendum Note (Signed)
 Addended by: Shanna Strength K on: 12/14/2024 04:30 PM   Modules accepted: Orders

## 2024-12-14 NOTE — Telephone Encounter (Signed)
 Edited referral to reflect new Dx code. Informed Diane Todd of change in case anything needs to be processed again.  Office could not take a verbal for new Dx code as initially intended

## 2024-12-14 NOTE — Telephone Encounter (Signed)
 Copied from CRM #[id number]. Topic: General - Other >> Dec 14, 2024 10:45 AM Mesmerise C wrote: Reason for CRM: [facility] states received referral and scheduled patient but the referral has a z-code and they can't bill a z-code stated to change it to F50.8 or F50.9 for eating disorders or other reasoning

## 2024-12-15 ENCOUNTER — Ambulatory Visit: Payer: Self-pay | Admitting: Student in an Organized Health Care Education/Training Program

## 2024-12-27 ENCOUNTER — Ambulatory Visit (HOSPITAL_COMMUNITY)

## 2024-12-28 ENCOUNTER — Encounter: Admitting: Registered"

## 2025-01-10 ENCOUNTER — Ambulatory Visit: Admitting: Student in an Organized Health Care Education/Training Program
